# Patient Record
Sex: Male | Born: 1995 | Race: White | Hispanic: No | Marital: Single | Smoking: Current every day smoker
Health system: Southern US, Community
[De-identification: ages and names within clinical notes are randomized; demographics above are authoritative.]

## PROBLEM LIST (undated history)

## (undated) DIAGNOSIS — F32A Depression, unspecified: Secondary | ICD-10-CM

## (undated) DIAGNOSIS — F29 Unspecified psychosis not due to a substance or known physiological condition: Secondary | ICD-10-CM

## (undated) DIAGNOSIS — F4310 Post-traumatic stress disorder, unspecified: Secondary | ICD-10-CM

## (undated) HISTORY — PX: TONSILLECTOMY: SUR1361

## (undated) HISTORY — PX: NASAL SINUS SURGERY: SHX719

---

## 2021-07-13 ENCOUNTER — Emergency Department (HOSPITAL_COMMUNITY)
Admission: EM | Admit: 2021-07-13 | Discharge: 2021-07-13 | Disposition: A | Discharge status: Home / Self Care | Payer: Medicaid Other | Attending: Emergency Medicine | Admitting: Emergency Medicine

## 2021-07-13 ENCOUNTER — Other Ambulatory Visit: Payer: Self-pay

## 2021-07-13 DIAGNOSIS — R001 Bradycardia, unspecified: Secondary | ICD-10-CM | POA: Insufficient documentation

## 2021-07-13 DIAGNOSIS — R112 Nausea with vomiting, unspecified: Secondary | ICD-10-CM | POA: Diagnosis present

## 2021-07-13 DIAGNOSIS — R059 Cough, unspecified: Secondary | ICD-10-CM | POA: Insufficient documentation

## 2021-07-13 DIAGNOSIS — R197 Diarrhea, unspecified: Secondary | ICD-10-CM | POA: Diagnosis not present

## 2021-07-13 DIAGNOSIS — Z20822 Contact with and (suspected) exposure to covid-19: Secondary | ICD-10-CM | POA: Diagnosis not present

## 2021-07-13 DIAGNOSIS — R0981 Nasal congestion: Secondary | ICD-10-CM | POA: Diagnosis not present

## 2021-07-13 LAB — COMPREHENSIVE METABOLIC PANEL
ALT: 25 U/L (ref 0–44)
AST: 20 U/L (ref 15–41)
Albumin: 4.3 g/dL (ref 3.5–5.0)
Alkaline Phosphatase: 51 U/L (ref 38–126)
Anion gap: 7 (ref 5–15)
BUN: 8 mg/dL (ref 6–20)
CO2: 25 mmol/L (ref 22–32)
Calcium: 9.2 mg/dL (ref 8.9–10.3)
Chloride: 107 mmol/L (ref 98–111)
Creatinine, Ser: 1.03 mg/dL (ref 0.61–1.24)
GFR, Estimated: >60 mL/min (ref >60)
Glucose, Bld: 111 mg/dL — ABNORMAL HIGH (ref 70–99)
Potassium: 3.9 mmol/L (ref 3.5–5.1)
Sodium: 139 mmol/L (ref 135–145)
Total Bilirubin: 0.4 mg/dL (ref 0.3–1.2)
Total Protein: 6.9 g/dL (ref 6.5–8.1)

## 2021-07-13 LAB — URINALYSIS, ROUTINE W REFLEX MICROSCOPIC
Bilirubin Urine: NEGATIVE
Glucose, UA: NEGATIVE mg/dL
Hgb urine dipstick: NEGATIVE
Ketones, ur: NEGATIVE mg/dL
Leukocytes,Ua: NEGATIVE
Nitrite: NEGATIVE
Protein, ur: NEGATIVE mg/dL
Specific Gravity, Urine: 1.01 (ref 1.005–1.030)
pH: 6 (ref 5.0–8.0)

## 2021-07-13 LAB — CBC
HCT: 47.6 % (ref 39.0–52.0)
Hemoglobin: 15.9 g/dL (ref 13.0–17.0)
MCH: 29.5 pg (ref 26.0–34.0)
MCHC: 33.4 g/dL (ref 30.0–36.0)
MCV: 88.3 fL (ref 80.0–100.0)
Platelets: 241 10*3/uL (ref 150–400)
RBC: 5.39 MIL/uL (ref 4.22–5.81)
RDW: 13.5 % (ref 11.5–15.5)
WBC: 14.5 10*3/uL — ABNORMAL HIGH (ref 4.0–10.5)
nRBC: 0 % (ref 0.0–0.2)

## 2021-07-13 LAB — RESP PANEL BY RT-PCR (FLU A&B, COVID) ARPGX2
Influenza A by PCR: NEGATIVE
Influenza B by PCR: NEGATIVE
SARS Coronavirus 2 by RT PCR: NEGATIVE

## 2021-07-13 LAB — LIPASE, BLOOD: Lipase: 63 U/L — ABNORMAL HIGH (ref 11–51)

## 2021-07-13 MED ORDER — ONDANSETRON 4 MG PO TBDP: ORAL_TABLET | ORAL | 0 refills | Status: DC

## 2021-07-13 MED ORDER — ONDANSETRON 4 MG PO TBDP
4.0000 mg | ORAL_TABLET | Freq: Once | ORAL | Status: AC
  Administered 2021-07-13: 4 mg via ORAL
  Filled 2021-07-13: qty 1

## 2021-07-13 NOTE — ED Provider Triage Note (Signed)
Emergency Medicine Provider Triage Evaluation Note  Karma Hiney , a 26 y.o. male  was evaluated in triage.  Pt complains of n/v/d. States that same has been ongoing for the past 2 weeks. He states that he is nauseous when he wakes up in the morning and has 3-4 episodes of vomiting throughout the morning which then resolves as the day progresses. States that diarrhea is constant throughout the day. Also endorses associated URI symptoms that has also been ongoing for the past 2 weeks. States that his employer is requiring that he have a work note to return to work.   Review of Systems  Positive: N/v/d Negative: Abd pain, fevers, chills  Physical Exam  BP 122/85 (BP Location: Right Arm)    Pulse (!) 103    Temp 98.2 F (36.8 C) (Oral)    Resp 16    SpO2 98%  Gen:   Awake, no distress   Resp:  Normal effort  MSK:   Moves extremities without difficulty  Other:    Medical Decision Making  Medically screening exam initiated at 10:13 AM.  Appropriate orders placed.  Dreyson Mishkin was informed that the remainder of the evaluation will be completed by another provider, this initial triage assessment does not replace that evaluation, and the importance of remaining in the ED until their evaluation is complete.     Silva Bandy, PA-C 07/13/21 1017

## 2021-07-13 NOTE — Discharge Instructions (Signed)
Use Zofran as needed for nausea or vomiting. Follow-up COVID test results on MyChart this evening. Work note provided.

## 2021-07-13 NOTE — ED Triage Notes (Signed)
Pt reports two weeks of n/v/d for the first half of the day. Endorses generalized weakness. Reports he is here because his boss wants him to have a work note.

## 2021-07-13 NOTE — ED Provider Notes (Signed)
[facility] EMERGENCY DEPARTMENT Provider Note   CSN: [id number] Arrival date & time: 07/13/21  0947     History  Chief Complaint  Patient presents with   Emesis   Diarrhea    Jon Clayton is a 26 y.o. male.  Patient with no active medical problems presents with nausea vomiting diarrhea congestion cough intermittent for almost 2 weeks.  No significant sick contacts or recent travel.  Vomiting nonbloody nonbilious.  Symptoms intermittent throughout the week.  Nothing specifically triggers.  No new foods.  Patient also needs work note.      Home Medications Prior to Admission medications   Medication Sig Start Date End Date Taking? Authorizing Provider  albuterol (VENTOLIN HFA) 108 (90 Base) MCG/ACT inhaler Inhale 1-2 puffs into the lungs every 6 (six) hours as needed for wheezing or shortness of breath.   Yes [provider]  ondansetron (ZOFRAN-ODT) 4 MG disintegrating tablet 4mg  ODT q4 hours prn nausea/vomit 07/13/21  Yes Elnora Morrison, MD  busPIRone (BUSPAR) 15 MG tablet Take 15 mg by mouth 3 (three) times daily. No fill Hx in the past year    [provider]      Allergies    Pertussis vaccines, Amoxicillin, and Penicillins    Review of Systems   Review of Systems  Constitutional:  Negative for chills and fever.  HENT:  Positive for congestion.   Eyes:  Negative for visual disturbance.  Respiratory:  Positive for cough. Negative for shortness of breath.   Cardiovascular:  Negative for chest pain.  Gastrointestinal:  Positive for diarrhea, nausea and vomiting. Negative for abdominal pain.  Genitourinary:  Negative for dysuria and flank pain.  Musculoskeletal:  Negative for back pain, neck pain and neck stiffness.  Skin:  Negative for rash.  Neurological:  Negative for light-headedness and headaches.   Physical Exam Updated Vital Signs BP (!) 112/56    Pulse (!) 54    Temp 98.2 F (36.8 C) (Oral)    Resp (!) 27    SpO2 96%   Physical Exam Vitals and nursing note reviewed.  Constitutional:      General: He is not in acute distress.    Appearance: He is well-developed.  HENT:     Head: Normocephalic and atraumatic.     Mouth/Throat:     Mouth: Mucous membranes are dry.  Eyes:     General:        Right eye: No discharge.        Left eye: No discharge.     Conjunctiva/sclera: Conjunctivae normal.  Neck:     Trachea: No tracheal deviation.  Cardiovascular:     Rate and Rhythm: Regular rhythm. Bradycardia present.     Heart sounds: No murmur heard. Pulmonary:     Effort: Pulmonary effort is normal.     Breath sounds: Normal breath sounds.  Abdominal:     General: There is no distension.     Palpations: Abdomen is soft.     Tenderness: There is no abdominal tenderness. There is no guarding.  Musculoskeletal:        General: Normal range of motion.     Cervical back: Normal range of motion and neck supple. No rigidity.  Skin:    General: Skin is warm.     Capillary Refill: Capillary refill takes less than 2 seconds.     Findings: No rash.  Neurological:     General: No focal deficit present.     Mental Status: He  is alert.     Cranial Nerves: No cranial nerve deficit.  Psychiatric:        Mood and Affect: Mood normal.    ED Results / Procedures / Treatments   Labs (all labs ordered are listed, but only abnormal results are displayed) Labs Reviewed  LIPASE, BLOOD - Abnormal; Notable for the following components:      Result Value   Lipase 63 (*)    All other components within normal limits  COMPREHENSIVE METABOLIC PANEL - Abnormal; Notable for the following components:   Glucose, Bld 111 (*)    All other components within normal limits  CBC - Abnormal; Notable for the following components:   WBC 14.5 (*)    All other components within normal limits  RESP PANEL BY RT-PCR (FLU A&B, COVID) ARPGX2  URINALYSIS, ROUTINE W REFLEX MICROSCOPIC    EKG None  Radiology No results  found.  Procedures Procedures    Medications Ordered in ED Medications  ondansetron (ZOFRAN-ODT) disintegrating tablet 4 mg (4 mg Oral Given 07/13/21 1503)    ED Course/ Medical Decision Making/ A&P                           Medical Decision Making Amount and/or Complexity of Data Reviewed Labs: ordered.  Risk Prescription drug management.   Patient presents with intermittent symptoms for almost 2 weeks differential including viral/toxin mediated, multiple infections likely both viral, colitis, gastritis, pancreatitis, other.  No focal abdominal pain especially in the right lower quadrant to suggest appendicitis.  No clinical evidence of bacterial pneumonia with normal work of breathing, clear lungs and normal oxygenation.  Blood work ordered and independently reviewed reassuring mild leukocytosis 14,000 likely from viral infection or vomiting or both.  Lipase minimally elevated 63, not clinically significant for pancreatitis given exam and mild elevation.  Electrolytes unremarkable patient compensating well.  Zofran given.  Patient requests work note which was given.  Discussed close follow-up with a primary doctor.        Final Clinical Impression(s) / ED Diagnoses Final diagnoses:  Nausea vomiting and diarrhea  Cough in adult    Rx / DC Orders ED Discharge Orders          Ordered    ondansetron (ZOFRAN-ODT) 4 MG disintegrating tablet        07/13/21 1506              Elnora Morrison, MD 07/15/21 1550

## 2021-08-09 ENCOUNTER — Emergency Department (HOSPITAL_COMMUNITY)
Admission: EM | Admit: 2021-08-09 | Discharge: 2021-08-09 | Disposition: A | Discharge status: Home / Self Care | Payer: Medicaid Other | Attending: Emergency Medicine | Admitting: Emergency Medicine

## 2021-08-09 ENCOUNTER — Emergency Department (HOSPITAL_COMMUNITY): Payer: Medicaid Other

## 2021-08-09 ENCOUNTER — Encounter (HOSPITAL_COMMUNITY): Payer: Self-pay | Admitting: Emergency Medicine

## 2021-08-09 DIAGNOSIS — R197 Diarrhea, unspecified: Secondary | ICD-10-CM | POA: Insufficient documentation

## 2021-08-09 DIAGNOSIS — R1011 Right upper quadrant pain: Secondary | ICD-10-CM | POA: Diagnosis present

## 2021-08-09 DIAGNOSIS — K219 Gastro-esophageal reflux disease without esophagitis: Secondary | ICD-10-CM | POA: Diagnosis not present

## 2021-08-09 DIAGNOSIS — J45909 Unspecified asthma, uncomplicated: Secondary | ICD-10-CM | POA: Insufficient documentation

## 2021-08-09 DIAGNOSIS — R112 Nausea with vomiting, unspecified: Secondary | ICD-10-CM | POA: Diagnosis not present

## 2021-08-09 DIAGNOSIS — R059 Cough, unspecified: Secondary | ICD-10-CM | POA: Diagnosis not present

## 2021-08-09 LAB — COMPREHENSIVE METABOLIC PANEL
ALT: 22 U/L (ref 0–44)
AST: 18 U/L (ref 15–41)
Albumin: 4.4 g/dL (ref 3.5–5.0)
Alkaline Phosphatase: 47 U/L (ref 38–126)
Anion gap: 10 (ref 5–15)
BUN: 8 mg/dL (ref 6–20)
CO2: 23 mmol/L (ref 22–32)
Calcium: 9.1 mg/dL (ref 8.9–10.3)
Chloride: 107 mmol/L (ref 98–111)
Creatinine, Ser: 1.04 mg/dL (ref 0.61–1.24)
GFR, Estimated: >60 mL/min (ref >60)
Glucose, Bld: 103 mg/dL — ABNORMAL HIGH (ref 70–99)
Potassium: 4.1 mmol/L (ref 3.5–5.1)
Sodium: 140 mmol/L (ref 135–145)
Total Bilirubin: 0.7 mg/dL (ref 0.3–1.2)
Total Protein: 6.7 g/dL (ref 6.5–8.1)

## 2021-08-09 LAB — URINALYSIS, ROUTINE W REFLEX MICROSCOPIC
Bilirubin Urine: NEGATIVE
Glucose, UA: NEGATIVE mg/dL
Hgb urine dipstick: NEGATIVE
Ketones, ur: NEGATIVE mg/dL
Leukocytes,Ua: NEGATIVE
Nitrite: NEGATIVE
Protein, ur: NEGATIVE mg/dL
Specific Gravity, Urine: 1.019 (ref 1.005–1.030)
pH: 7 (ref 5.0–8.0)

## 2021-08-09 LAB — CBC
HCT: 47.1 % (ref 39.0–52.0)
Hemoglobin: 15.2 g/dL (ref 13.0–17.0)
MCH: 28.9 pg (ref 26.0–34.0)
MCHC: 32.3 g/dL (ref 30.0–36.0)
MCV: 89.5 fL (ref 80.0–100.0)
Platelets: 229 10*3/uL (ref 150–400)
RBC: 5.26 MIL/uL (ref 4.22–5.81)
RDW: 13.7 % (ref 11.5–15.5)
WBC: 8.4 10*3/uL (ref 4.0–10.5)
nRBC: 0 % (ref 0.0–0.2)

## 2021-08-09 LAB — LIPASE, BLOOD: Lipase: 51 U/L (ref 11–51)

## 2021-08-09 MED ORDER — ALBUTEROL SULFATE HFA 108 (90 BASE) MCG/ACT IN AERS
2.0000 | INHALATION_SPRAY | RESPIRATORY_TRACT | Status: AC
  Administered 2021-08-09: 2 via RESPIRATORY_TRACT
  Filled 2021-08-09: qty 6.7

## 2021-08-09 MED ORDER — SODIUM CHLORIDE 0.9 % IV BOLUS: 1000.0000 mL | Freq: Once | INTRAVENOUS | Status: DC

## 2021-08-09 MED ORDER — ONDANSETRON 4 MG PO TBDP: ORAL_TABLET | ORAL | 0 refills | Status: DC

## 2021-08-09 NOTE — ED Triage Notes (Signed)
Patient here with complaint of continued abdominal pain since being seen for same on February 2 this year. Patient denies changes in symptoms since then. Patient reports intermittent vomiting and diarrhea. Patient is alert, oriented, ambulatory, and in no apparent distress at this time.

## 2021-08-09 NOTE — ED Provider Triage Note (Signed)
Emergency Medicine Provider Triage Evaluation Note  Mahlon Gabrielle , a 26 y.o. male  was evaluated in triage.  Pt complains of right upper quadrant abdominal pain, nausea, vomiting, and diarrhea.  This is worse with foods.  Ongoing for 3 weeks.  Patient was seen and evaluated on 07/13/2021 for similar symptoms.  Review of Systems  Positive:  Negative: Fever, chills  Physical Exam  BP (!) 107/93 (BP Location: Right Arm)    Pulse 68    Temp 98.8 F (37.1 C) (Oral)    Resp 16    SpO2 98%  Gen:   Awake, no distress   Resp:  Normal effort  MSK:   Moves extremities without difficulty  Other:  Right upper quadrant, no tenderness  Medical Decision Making  Medically screening exam initiated at 12:54 PM.  Appropriate orders placed.  Dewan Emond was informed that the remainder of the evaluation will be completed by another provider, this initial triage assessment does not replace that evaluation, and the importance of remaining in the ED until their evaluation is complete.     Honor Loh Eugenio Saenz, [place] 08/09/21 1256

## 2021-08-09 NOTE — ED Provider Notes (Signed)
[facility] EMERGENCY DEPARTMENT Provider Note  History   Chief Complaint  Patient presents with   Abdominal Pain   The history is provided by the patient.  Abdominal Pain Pain location:  LUQ Pain quality: cramping and dull   Pain radiates to:  Does not radiate Pain severity:  Moderate Onset quality:  Unable to specify Duration: 1 mo. Timing:  Intermittent Progression:  Waxing and waning Chronicity:  New Context: eating and retching   Context: not awakening from sleep, not previous surgeries, not recent travel, not sick contacts, not suspicious food intake and not trauma   Relieved by:  Nothing Worsened by:  Eating Ineffective treatments:  None tried Associated symptoms: diarrhea, nausea and vomiting   Associated symptoms: no chest pain, no chills, no constipation, no cough, no dysuria, no fever, no hematuria, no shortness of breath and no sore throat   Risk factors: not elderly, has not had multiple surgeries and not obese     History reviewed. No pertinent past medical history.      No family history on file.  Review of Systems  Constitutional:  Negative for chills and fever.  HENT:  Negative for congestion and sore throat.   Eyes:  Negative for photophobia and visual disturbance.  Respiratory:  Negative for cough and shortness of breath.   Cardiovascular:  Negative for chest pain and palpitations.  Gastrointestinal:  Positive for abdominal pain, diarrhea, nausea and vomiting. Negative for abdominal distention, blood in stool and constipation.  Endocrine: Negative.   Genitourinary:  Negative for dysuria, flank pain, hematuria, penile pain and testicular pain.  Musculoskeletal:  Negative for neck pain and neck stiffness.  Skin:  Negative for rash and wound.  Allergic/Immunologic: Negative.   Neurological:  Negative for dizziness, syncope and headaches.  Hematological: Negative.   Psychiatric/Behavioral: Negative.      Physical Exam   Today's  Vitals   08/09/21 1227 08/09/21 1228 08/09/21 1459  BP:  (!) 107/93 94/61  Pulse:  68 65  Resp:  16 16  Temp:  98.8 F (37.1 C)   TempSrc:  Oral   SpO2:  98% 99%  PainSc: 6        Physical Exam Vitals and nursing note reviewed.  Constitutional:      General: He is not in acute distress.    Appearance: He is well-developed and normal weight. He is not ill-appearing, toxic-appearing or diaphoretic.  HENT:     Head: Normocephalic and atraumatic.     Nose: Nose normal. No congestion.     Mouth/Throat:     Mouth: Mucous membranes are moist.     Pharynx: Oropharynx is clear. No oropharyngeal exudate.  Eyes:     Extraocular Movements: Extraocular movements intact.     Pupils: Pupils are equal, round, and reactive to light.  Cardiovascular:     Rate and Rhythm: Normal rate and regular rhythm.     Pulses: Normal pulses.     Heart sounds: Normal heart sounds. No murmur heard. Pulmonary:     Effort: Pulmonary effort is normal. No respiratory distress.     Breath sounds: Normal breath sounds. No stridor. No wheezing, rhonchi or rales.  Abdominal:     General: There is no distension.     Palpations: Abdomen is soft.     Tenderness: There is no abdominal tenderness. There is no right CVA tenderness, left CVA tenderness, guarding or rebound.     Hernia: No hernia is present.  Musculoskeletal:  General: No swelling. Normal range of motion.     Cervical back: Normal range of motion and neck supple. No tenderness.     Right lower leg: No edema.     Left lower leg: No edema.  Skin:    General: Skin is warm and dry.     Capillary Refill: Capillary refill takes less than 2 seconds.     Findings: No rash.  Neurological:     General: No focal deficit present.     Mental Status: He is alert and oriented to person, place, and time. Mental status is at baseline.     Cranial Nerves: No cranial nerve deficit.     Sensory: No sensory deficit.     Motor: No weakness.     Coordination:  Coordination normal.  Psychiatric:        Mood and Affect: Mood normal.        Behavior: Behavior normal.    ED Course  Procedures  Medical Decision Making:  Jon Clayton is a 26 y.o. male w/ h/o IBS (reportedly diagnosed as a child), GERD, asthma who p/w chronic, assisted RUQ abdominal pain, emesis, diarrhea.   Patient reports he has had ongoing diarrhea for years, nonbloody, usually twice a day, the patient reports that approximately 80% of his stools are loose.  Patient states he has a history of IBS diagnosed as a child, but does not take any medications.  He has not been seen by GI as an adult.  Patient reports he has had daily emesis over the past 1.5-2 months.  He reports nonbloody emesis, approximately 2 episodes per day, persistent nausea, but able to tolerate p.o. intermittently.  Patient reports RUQ abdominal pain intermittently over the past month, worse with eating "anything with a sauce."  Patient denies preceding trauma.  Denies fever.  Denies history of abdominal surgery.  Patient states he has had an intermittent cough of unknown duration, he states he has a history of asthma and has "been out of his home inhalers for months."  Patient states he should be taking Symbicort and albuterol.  Patient clarifies that he does not have a primary care doctor but would be interested in establishing with primary care.  She denies any testicular pain or urinary symptoms. No flank pain on examination Lungs clear Abdomen soft, nontender (to include in the RUQ)  RUQ ultrasound ordered while patient was in triage, has already been obtained, results below.  Basic lab work ordered in triage, results below.  ER provider interpretation of Imaging / Radiology:  RUQ [place]: Fatty liver, no other sonographic abnormality seen in RUQ.  No gallstones or gallbladder wall thickening, no sonographic Murphy sign.  ER provider interpretation of EKG:  Not indicated  ER provider interpretation of Labs:   CBC: No leukocytosis or acute anemia CMP: No significant electrolyte abnormality, no AKI, no elevated LFTs Lipase: Within normal range UA: UA without UTI or blood  Key medications administered in the ER:  Medications  albuterol (VENTOLIN HFA) 108 (90 Base) MCG/ACT inhaler 2 puff (has no administration in time range)   Diagnoses considered: Unknown etiology of persistent intermittent abdominal pain, may relate to viral infection versus IBD vs gastritis.  Doubt bowel obstruction as patient is passing gas and has regular bowel movements.  Doubt AAA as no history of the same.  Doubt ACS as young, no chest pain.  Offered CXR to evaluate for PNA, patient declined.  Doubt PTX as has bilateral breath sounds in all fields.  Doubt  pyelonephritis as UA without UTI, and no flank pain.  Doubt nephrolithiasis as UA without blood and no flank pain.  Doubt pancreatitis as no epigastric tenderness to palpation, and lipase within normal range.  Doubt cholecystitis as RUQ ultrasound did not reveal gallstones, gallbladder wall thickening, or sonographic Murphy sign.  Doubt shingles as young age and no overlying skin changes.  Doubt perforated bowel or ulcer as no peritonitis on examination.  Doubt diverticulosis/diverticulitis as pain localized in RUQ.  Doubt ischemic mesentery as patient does not display pain out of proportion, and is not pain with every p.o. intake.  Doubt strangulated/incarcerated hernia as no evidence on examination.  Patient without peritoneal signs or other indication of need for surgical intervention.  Consulted: None required in the ED.  Patient able to tolerate p.o. on examination prior to discharge Discharge with short prescription for Zofran Information given for PCP and outpatient GI Offered CXR in setting of abdominal pain, emesis, and cough.  Patient declined. Patient states he has been out of his albuterol MDI for a few months and he has had increased cough, will give albuterol MDI in  the ED for the patient to take home.  No respiratory distress or wheezing at this time.  Key discharge instructions: Spoke to patient at bedside, all questions were answered at this time, close return precautions given, and patient voiced understanding and agreement with plan. Patient discharged in stable condition.   Patient seen in conjunction with Dr. Dawna Part medical dictation software was used in the creation of this note.   Electronically signed by: Wynetta Fines, MD on 08/09/2021 at 4:36 PM  Clinical Impression:  1. RUQ abdominal pain     Dispo: Discharge    Wynetta Fines, MD 08/09/21 2221    Carmin Muskrat, MD 08/09/21 2244

## 2021-08-09 NOTE — Discharge Instructions (Addendum)
Please call [phone] to establish with outpatient primary care to address your chronic medical conditions.  You may call [phone] to follow-up with outpatient [facility] for your chronic abdominal pain.  We offered a chest x-ray in the emergency department, you declined.  The ultrasound of your gallbladder does not reveal any acute gallbladder pathology.  Your labs were unremarkable for any acute emergent problem.

## 2021-11-03 ENCOUNTER — Encounter (HOSPITAL_COMMUNITY): Payer: Self-pay

## 2021-11-03 ENCOUNTER — Other Ambulatory Visit: Payer: Self-pay

## 2021-11-03 ENCOUNTER — Emergency Department (HOSPITAL_COMMUNITY)
Admission: EM | Admit: 2021-11-03 | Discharge: 2021-11-04 | Disposition: A | Discharge status: Home / Self Care | Payer: Medicaid Other | Attending: Emergency Medicine | Admitting: Emergency Medicine

## 2021-11-03 DIAGNOSIS — Z20822 Contact with and (suspected) exposure to covid-19: Secondary | ICD-10-CM | POA: Insufficient documentation

## 2021-11-03 DIAGNOSIS — F332 Major depressive disorder, recurrent severe without psychotic features: Secondary | ICD-10-CM | POA: Diagnosis not present

## 2021-11-03 DIAGNOSIS — R4182 Altered mental status, unspecified: Secondary | ICD-10-CM | POA: Insufficient documentation

## 2021-11-03 DIAGNOSIS — F419 Anxiety disorder, unspecified: Secondary | ICD-10-CM

## 2021-11-03 DIAGNOSIS — R4589 Other symptoms and signs involving emotional state: Secondary | ICD-10-CM

## 2021-11-03 DIAGNOSIS — F322 Major depressive disorder, single episode, severe without psychotic features: Secondary | ICD-10-CM | POA: Insufficient documentation

## 2021-11-03 DIAGNOSIS — F39 Unspecified mood [affective] disorder: Secondary | ICD-10-CM

## 2021-11-03 DIAGNOSIS — R45851 Suicidal ideations: Secondary | ICD-10-CM | POA: Diagnosis not present

## 2021-11-03 DIAGNOSIS — F430 Acute stress reaction: Secondary | ICD-10-CM

## 2021-11-03 LAB — ETHANOL: Alcohol, Ethyl (B): <10 mg/dL (ref <10)

## 2021-11-03 LAB — COMPREHENSIVE METABOLIC PANEL
ALT: 15 U/L (ref 0–44)
AST: 14 U/L — ABNORMAL LOW (ref 15–41)
Albumin: 4.7 g/dL (ref 3.5–5.0)
Alkaline Phosphatase: 49 U/L (ref 38–126)
Anion gap: 4 — ABNORMAL LOW (ref 5–15)
BUN: 10 mg/dL (ref 6–20)
CO2: 23 mmol/L (ref 22–32)
Calcium: 8.9 mg/dL (ref 8.9–10.3)
Chloride: 111 mmol/L (ref 98–111)
Creatinine, Ser: 0.88 mg/dL (ref 0.61–1.24)
GFR, Estimated: >60 mL/min (ref >60)
Glucose, Bld: 96 mg/dL (ref 70–99)
Potassium: 4 mmol/L (ref 3.5–5.1)
Sodium: 138 mmol/L (ref 135–145)
Total Bilirubin: 0.6 mg/dL (ref 0.3–1.2)
Total Protein: 7.4 g/dL (ref 6.5–8.1)

## 2021-11-03 LAB — CBC WITH DIFFERENTIAL/PLATELET
Abs Immature Granulocytes: 0.02 10*3/uL (ref 0.00–0.07)
Basophils Absolute: 0 10*3/uL (ref 0.0–0.1)
Basophils Relative: 0 %
Eosinophils Absolute: 0 10*3/uL (ref 0.0–0.5)
Eosinophils Relative: 0 %
HCT: 45.2 % (ref 39.0–52.0)
Hemoglobin: 15 g/dL (ref 13.0–17.0)
Immature Granulocytes: 0 %
Lymphocytes Relative: 18 %
Lymphs Abs: 2 10*3/uL (ref 0.7–4.0)
MCH: 29.3 pg (ref 26.0–34.0)
MCHC: 33.2 g/dL (ref 30.0–36.0)
MCV: 88.3 fL (ref 80.0–100.0)
Monocytes Absolute: 0.5 10*3/uL (ref 0.1–1.0)
Monocytes Relative: 4 %
Neutro Abs: 8.2 10*3/uL — ABNORMAL HIGH (ref 1.7–7.7)
Neutrophils Relative %: 78 %
Platelets: 228 10*3/uL (ref 150–400)
RBC: 5.12 MIL/uL (ref 4.22–5.81)
RDW: 13.6 % (ref 11.5–15.5)
WBC: 10.7 10*3/uL — ABNORMAL HIGH (ref 4.0–10.5)
nRBC: 0 % (ref 0.0–0.2)

## 2021-11-03 LAB — RAPID URINE DRUG SCREEN, HOSP PERFORMED
Amphetamines: NOT DETECTED
Barbiturates: NOT DETECTED
Benzodiazepines: NOT DETECTED
Cocaine: NOT DETECTED
Opiates: NOT DETECTED
Tetrahydrocannabinol: POSITIVE — AB

## 2021-11-03 LAB — RESP PANEL BY RT-PCR (FLU A&B, COVID) ARPGX2
Influenza A by PCR: NEGATIVE
Influenza B by PCR: NEGATIVE
SARS Coronavirus 2 by RT PCR: NEGATIVE

## 2021-11-03 NOTE — ED Provider Notes (Signed)
[facility]-EMERGENCY DEPT Provider Note   CSN: [id number] Arrival date & time: 11/03/21  1737     History  Chief Complaint  Patient presents with   Suicidal    Jon Clayton is a 26 y.o. male.  Patient complains of being suicidal.  He states that he has tried to hurt himself before and he wants to hurt himself now.  Patient has no medical problems  The history is provided by the patient and medical records. No language interpreter was used.  Altered Mental Status Presenting symptoms: behavior changes   Severity:  Severe Most recent episode:  More than 2 days ago Episode history:  Continuous Timing:  Constant Progression:  Worsening Chronicity:  Recurrent Context: not alcohol use   Associated symptoms: no abdominal pain, no hallucinations, no headaches, no rash and no seizures       Home Medications Prior to Admission medications   Medication Sig Start Date End Date Taking? Authorizing Provider  albuterol (VENTOLIN HFA) 108 (90 Base) MCG/ACT inhaler Inhale 1-2 puffs into the lungs every 6 (six) hours as needed for wheezing or shortness of breath.    [provider]  busPIRone (BUSPAR) 15 MG tablet Take 15 mg by mouth 3 (three) times daily. No fill Hx in the past year    [provider]  ondansetron (ZOFRAN-ODT) 4 MG disintegrating tablet 4mg  ODT q4 hours prn nausea/vomit 08/09/21   08/11/21, MD      Allergies    Pertussis vaccines, Amoxicillin, and Penicillins    Review of Systems   Review of Systems  Constitutional:  Negative for appetite change and fatigue.  HENT:  Negative for congestion, ear discharge and sinus pressure.   Eyes:  Negative for discharge.  Respiratory:  Negative for cough.   Cardiovascular:  Negative for chest pain.  Gastrointestinal:  Negative for abdominal pain and diarrhea.  Genitourinary:  Negative for frequency and hematuria.  Musculoskeletal:  Negative for back pain.  Skin:  Negative for rash.   Neurological:  Negative for seizures and headaches.  Psychiatric/Behavioral:  Negative for hallucinations.        Suicidal   Physical Exam Updated Vital Signs BP (!) 99/55 (BP Location: Left Arm)   Pulse 66   Temp 98.4 F (36.9 C)   Resp 17   Ht 5' 7.25" (1.708 m)   Wt 74.8 kg   SpO2 99%   BMI 25.65 kg/m  Physical Exam Vitals and nursing note reviewed.  Constitutional:      Appearance: He is well-developed.  HENT:     Head: Normocephalic.     Right Ear: External ear normal.     Nose: Nose normal.  Eyes:     General: No scleral icterus.    Conjunctiva/sclera: Conjunctivae normal.  Neck:     Thyroid: No thyromegaly.  Cardiovascular:     Rate and Rhythm: Normal rate and regular rhythm.     Heart sounds: No murmur heard.   No friction rub. No gallop.  Pulmonary:     Breath sounds: No stridor. No wheezing or rales.  Chest:     Chest wall: No tenderness.  Abdominal:     General: There is no distension.     Tenderness: There is no abdominal tenderness. There is no rebound.  Musculoskeletal:        General: Normal range of motion.     Cervical back: Neck supple.  Lymphadenopathy:     Cervical: No cervical adenopathy.  Skin:  Findings: No erythema or rash.  Neurological:     Mental Status: He is alert and oriented to person, place, and time.     Motor: No abnormal muscle tone.     Coordination: Coordination normal.  Psychiatric:     Comments: Patient is suicidal    ED Results / Procedures / Treatments   Labs (all labs ordered are listed, but only abnormal results are displayed) Labs Reviewed  COMPREHENSIVE METABOLIC PANEL - Abnormal; Notable for the following components:      Result Value   AST 14 (*)    Anion gap 4 (*)    All other components within normal limits  RAPID URINE DRUG SCREEN, HOSP PERFORMED - Abnormal; Notable for the following components:   Tetrahydrocannabinol POSITIVE (*)    All other components within normal limits  CBC WITH  DIFFERENTIAL/PLATELET - Abnormal; Notable for the following components:   WBC 10.7 (*)    Neutro Abs 8.2 (*)    All other components within normal limits  ETHANOL    EKG None  Radiology No results found.  Procedures Procedures    Medications Ordered in ED Medications - No data to display  ED Course/ Medical Decision Making/ A&P                           Medical Decision Making Amount and/or Complexity of Data Reviewed Labs: ordered.  This patient presents to the ED for concern of suicidal, this involves an extensive number of treatment options, and is a complaint that carries with it a high risk of complications and morbidity.  The differential diagnosis includes suicidal   Co morbidities that complicate the patient evaluation  History depression suicidal ideation   Additional history obtained:  Additional history obtained from patient External records from outside source obtained and reviewed including hospital record   Lab Tests:  I Ordered, and personally interpreted labs.  The pertinent results include: CBC chemistries and alcohol unremarkable   Imaging Studies ordered:  No imaging  Cardiac Monitoring: / EKG:  The patient was maintained on a cardiac monitor.  I personally viewed and interpreted the cardiac monitored which showed an underlying rhythm of: Normal sinus rhythm   Consultations Obtained:  I requested consultation with the behavioral health,  and discussed lab and imaging findings as well as pertinent plan - they recommend: Still pending   Problem List / ED Course / Critical interventions / Medication management  Suicidal ideation No medicines Reevaluation of the patient after these medicines showed that the patient stayed the same I have reviewed the patients home medicines and have made adjustments as needed   Social Determinants of Health:  None   Test / Admission - Considered:  No further testing needed we will await  behavioral health recommendations but I suspect he will be admitted for suicidal ideation  Patient with suicidal ideations.  He will be evaluated by behavioral health        Final Clinical Impression(s) / ED Diagnoses Final diagnoses:  None    Rx / DC Orders ED Discharge Orders     None         Bethann Berkshire, MD 11/03/21 Jerene Bears

## 2021-11-03 NOTE — ED Triage Notes (Signed)
Pt states he has not taken his psych meds in 2-3 months. Pt states he threatened to kill himself in front of his landlord today and is now here for SI.

## 2021-11-03 NOTE — ED Provider Notes (Signed)
Patient is involuntarily committed for suicidal ideation   Bethann Berkshire, MD 11/03/21 [phone]

## 2021-11-03 NOTE — ED Provider Notes (Signed)
Patient is suicidal.  He is medically cleared and can be evaluated by behavioral health now   Bethann Berkshire, MD 11/03/21 [phone]

## 2021-11-04 ENCOUNTER — Encounter (HOSPITAL_COMMUNITY): Payer: Self-pay | Admitting: Emergency Medicine

## 2021-11-04 ENCOUNTER — Emergency Department (HOSPITAL_COMMUNITY)
Admission: EM | Admit: 2021-11-04 | Discharge: 2021-11-05 | Disposition: A | Discharge status: Other Acute Inpatient Hospital | Payer: Medicaid Other | Source: Home / Self Care | Attending: Emergency Medicine | Admitting: Emergency Medicine

## 2021-11-04 ENCOUNTER — Ambulatory Visit (HOSPITAL_COMMUNITY)
Admission: AD | Admit: 2021-11-04 | Discharge: 2021-11-04 | Disposition: A | Discharge status: Home / Self Care | Payer: Medicaid Other | Attending: Psychiatry | Admitting: Psychiatry

## 2021-11-04 DIAGNOSIS — F4310 Post-traumatic stress disorder, unspecified: Secondary | ICD-10-CM | POA: Insufficient documentation

## 2021-11-04 DIAGNOSIS — F332 Major depressive disorder, recurrent severe without psychotic features: Secondary | ICD-10-CM | POA: Insufficient documentation

## 2021-11-04 DIAGNOSIS — R45851 Suicidal ideations: Secondary | ICD-10-CM | POA: Insufficient documentation

## 2021-11-04 DIAGNOSIS — Z20822 Contact with and (suspected) exposure to covid-19: Secondary | ICD-10-CM | POA: Insufficient documentation

## 2021-11-04 HISTORY — DX: Unspecified psychosis not due to a substance or known physiological condition: F29

## 2021-11-04 HISTORY — DX: Depression, unspecified: F32.A

## 2021-11-04 LAB — COMPREHENSIVE METABOLIC PANEL
ALT: 15 U/L (ref 0–44)
AST: 15 U/L (ref 15–41)
Albumin: 4.9 g/dL (ref 3.5–5.0)
Alkaline Phosphatase: 49 U/L (ref 38–126)
Anion gap: 9 (ref 5–15)
BUN: 8 mg/dL (ref 6–20)
CO2: 23 mmol/L (ref 22–32)
Calcium: 9.7 mg/dL (ref 8.9–10.3)
Chloride: 110 mmol/L (ref 98–111)
Creatinine, Ser: 0.95 mg/dL (ref 0.61–1.24)
GFR, Estimated: >60 mL/min (ref >60)
Glucose, Bld: 95 mg/dL (ref 70–99)
Potassium: 3.8 mmol/L (ref 3.5–5.1)
Sodium: 142 mmol/L (ref 135–145)
Total Bilirubin: 1.2 mg/dL (ref 0.3–1.2)
Total Protein: 7.9 g/dL (ref 6.5–8.1)

## 2021-11-04 LAB — CBC
HCT: 47.4 % (ref 39.0–52.0)
Hemoglobin: 16 g/dL (ref 13.0–17.0)
MCH: 29.6 pg (ref 26.0–34.0)
MCHC: 33.8 g/dL (ref 30.0–36.0)
MCV: 87.6 fL (ref 80.0–100.0)
Platelets: 246 10*3/uL (ref 150–400)
RBC: 5.41 MIL/uL (ref 4.22–5.81)
RDW: 13.6 % (ref 11.5–15.5)
WBC: 12.6 10*3/uL — ABNORMAL HIGH (ref 4.0–10.5)
nRBC: 0 % (ref 0.0–0.2)

## 2021-11-04 LAB — SALICYLATE LEVEL: Salicylate Lvl: <7 mg/dL — ABNORMAL LOW (ref 7.0–30.0)

## 2021-11-04 LAB — ETHANOL: Alcohol, Ethyl (B): <10 mg/dL (ref <10)

## 2021-11-04 LAB — ACETAMINOPHEN LEVEL: Acetaminophen (Tylenol), Serum: <10 ug/mL — ABNORMAL LOW (ref 10–30)

## 2021-11-04 LAB — RAPID URINE DRUG SCREEN, HOSP PERFORMED
Amphetamines: NOT DETECTED
Barbiturates: NOT DETECTED
Benzodiazepines: NOT DETECTED
Cocaine: NOT DETECTED
Opiates: NOT DETECTED
Tetrahydrocannabinol: POSITIVE — AB

## 2021-11-04 LAB — RESP PANEL BY RT-PCR (FLU A&B, COVID) ARPGX2
Influenza A by PCR: NEGATIVE
Influenza B by PCR: NEGATIVE
SARS Coronavirus 2 by RT PCR: NEGATIVE

## 2021-11-04 NOTE — H&P (Addendum)
Behavioral Health Medical Screening Exam  Jon Clayton is a 26 y.o. 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  Patient reported that he was just discharged from [facility] with resources to go to [facility]. However, he did not have access to a bus to take him there so he decided to walk to [facility].   As per chart review from [facility] CSW spoke with pt at bedside. Pt reported living in a [place]way house. Pt stated he was being kicked out of the home due to not having the money to pay. Pt stated he has applied to many jobs however due to him being a sex offender he is unable to pass the background test. CSW inquired if pt contacted Department of Social Services yet to find out if they can assist with paying his rent. Pt stated he has not contacted them yet. Social services and shelter resources are attached to pt's AVS.  Pt stated he has concerns about his Mental Health. CSW informed pt about [facility]. Pt reported his concerns about his medications . CSW made MD aware of his concerns. CSW spoke with pt about staffing agencies in the area and attached a list to pt's AVS.  Reported several suicide attempts by strangulation in 2006, and multiple OD with Xanax. Reported self injurious behaviors Rated anxiety as "6" on a scale of 0 to 10. Reported several inpatient hospitalizations up to 6 in 2022. Endorsed 4 hours of sleep last night reported not being followed by a therapist or a psychiatrist. Reported his whole family with mental illness. Endorsed SI without any plan, HI without any plan and , AVH with seeing bugs and people moving, and "[name]" trying to put him in trouble. Endorsed all symptoms of depression as isolation, crying spells, irritability, hopelessness, worthlessness, guilt, poor concentration and anhedonia.  Reported he was hungry and food and drink provided.   On assessment today, patient seen in the conference room. Chart reviewed and findings shared with the tx team and discussed with Dr. Lucianne Muss. Alert and oriented x 3. Speech clear but low in volume. Mood /Affect anxious, depressed, blunt and flat. Thought content delusional with paranoid ideation. Memory, judgement and insight fair.  When asked how we could help stated, "just need a place to stay for few days and get my mind straight."  Patient appears to be malingering.  Disposition: Based on my evaluation, the patient does not appear to have an emergency psychiatric condition due to malingering. Patient was psychiatrically cleared and community resources provided. He left [facility] without any incident.  Total Time spent with patient: 1 hour  Psychiatric Specialty Exam:  Presentation  General Appearance: Appropriate for Environment; Casual; Fairly Groomed Eye Contact:Fleeting Speech:Clear and Coherent; Slow Speech Volume:Normal Handedness:Right  Mood and Affect  Mood:Anxious; Depressed Affect:Blunt; Flat  Thought Process  Thought Processes:Coherent; Linear Descriptions of Associations:Intact  Orientation:Full (Time, Place and Person)  Thought Content:Delusions; Paranoid Ideation  History of Schizophrenia/Schizoaffective disorder:No  Duration of Psychotic Symptoms:N/A  Hallucinations:Hallucinations: Auditory; Visual Description of Auditory Hallucinations: "[name]" try to get me in trouble Description of Visual Hallucinations: Seeing bugs and people moving  Ideas of Reference:No data recorded Suicidal Thoughts:Suicidal Thoughts: Yes, Passive (Patient malingering) SI Passive Intent and/or Plan: -- (Patient malingering)  Homicidal Thoughts:Homicidal Thoughts: Yes, Passive (Patient malingering) HI Passive Intent and/or Plan: -- (  Patient malingering)  Sensorium  Memory:Immediate Fair; Recent Fair; Remote  Fair Judgment:Fair Insight:Fair  Executive Functions  Concentration:Fair Attention Span:Fair Recall:Fair Fund of Knowledge:Fair Language:Good  Psychomotor Activity  Psychomotor Activity:Psychomotor Activity: Normal  Assets  Assets:Communication Skills; Physical Health  Sleep  Sleep:Sleep: Fair Number of Hours of Sleep: 4  Physical Exam: Physical Exam Vitals and nursing note reviewed.  Constitutional:      Appearance: Normal appearance.  HENT:     Head: Normocephalic and atraumatic.     Right Ear: External ear normal.     Left Ear: External ear normal.     Mouth/Throat:     Mouth: Mucous membranes are moist.     Pharynx: Oropharynx is clear.  Eyes:     Extraocular Movements: Extraocular movements intact.     Conjunctiva/sclera: Conjunctivae normal.     Pupils: Pupils are equal, round, and reactive to light.  Cardiovascular:     Rate and Rhythm: Bradycardia present.     Comments: P 54 Pulmonary:     Effort: Pulmonary effort is normal.  Abdominal:     Palpations: Abdomen is soft.  Genitourinary:    Comments: deferred Musculoskeletal:        General: Normal range of motion.     Cervical back: Normal range of motion and neck supple.  Skin:    General: Skin is warm.  Neurological:     General: No focal deficit present.     Mental Status: He is alert and oriented to person, place, and time.  Psychiatric:        Behavior: Behavior normal.   Review of Systems  Constitutional: Negative.  Negative for chills and fever.  HENT: Negative.  Negative for hearing loss and tinnitus.   Eyes: Negative.  Negative for blurred vision and double vision.  Respiratory: Negative.  Negative for cough, sputum production, shortness of breath and wheezing.   Cardiovascular: Negative.  Negative for chest pain.  Gastrointestinal: Negative.  Negative for abdominal pain, constipation, diarrhea, heartburn, nausea and vomiting.  Genitourinary: Negative.  Negative for dysuria, frequency and  urgency.  Musculoskeletal: Negative.  Negative for back pain, falls, joint pain, myalgias and neck pain.  Skin: Negative.  Negative for itching and rash.  Neurological: Negative.  Negative for dizziness, tingling, tremors, sensory change, speech change, focal weakness, seizures, loss of consciousness, weakness and headaches.  Endo/Heme/Allergies: Negative.  Negative for environmental allergies and polydipsia. Does not bruise/bleed easily.  Psychiatric/Behavioral:  Positive for depression, hallucinations, substance abuse and suicidal ideas. The patient is nervous/anxious and has insomnia.   Blood pressure 112/74, pulse (!) 54, temperature 97.6 F (36.4 C), temperature source Oral, resp. rate 16, SpO2 100 %. There is no height or weight on file to calculate BMI.  Musculoskeletal: Strength & Muscle Tone: within normal limits Gait & Station: normal Patient leans: N/A   Recommendations:  Based on my evaluation the patient does not appear to have an emergency psychiatric condition due to malingering. Patient was psychiatrically cleared and community resources provided.  Cecilie Lowers, FNP 11/04/2021, 4:34 PM

## 2021-11-04 NOTE — Discharge Instructions (Addendum)
It was our pleasure to provide your ER care today - we hope that you feel better.  See resource guide provided in terms of accessing local shelters and other social services in the area. Also see resources in area as relates facilitating outpatient behavioral health follow up/counseling/etc.   For mental health issues and/or crisis, you may also go directly to the [facility] - it is open 24/7 and walk-ins are welcome - see attached information.  Return to ER if worse, new symptoms, fevers, chest pain, trouble breathing, or other concern.    Staffing agencies  Patent attorney, [employer]

## 2021-11-04 NOTE — ED Provider Notes (Signed)
Emergency Medicine Observation Re-evaluation Note  Jon Clayton is a 26 y.o. male, seen on rounds today.  Pt initially presented to the ED for complaints of housing associated stress, and feelings of anxiety and depression. Currently, the patient is calm, alert, no distress. No physical c/o. Reports feeling improved this AM.   Physical Exam  BP 95/69 (BP Location: Left Arm)   Pulse 62   Temp 98.4 F (36.9 C)   Resp 15   Ht 1.708 m (5' 7.25")   Wt 74.8 kg   SpO2 98%   BMI 25.65 kg/m  Physical Exam General: alert, content.  Cardiac: regular rate.  Lungs: breathing comfortably. Psych: normal mood and affect. Pt does acknowledge stress/anxiety related to housing/housing instability. He indicates he is from [place], has been in this area for several months, living at [facility] home.  He indicates due to inability to pay he was being asked to leave there, and so they brought him to ED.  Pt notes intermittent feelings of anxiety, depression, and transient/recurrent SI for many years, but indicates has no plan to harm self, and is not wanting to harm or injure self. Pt is not responding to internal stimuli. No delusions or hallucinations are noted. No acute psychosis.   ED Course / MDM    I have reviewed the labs performed to date as well as medications administered while in observation.  Recent changes in the last 24 hours include  ED obs, reassessment.   Plan    Patient without active SI or plan to harm self - he does express frustration about new housing instability. TOC team asked to meet with patient and provide resources.   Pt to be provided resources for shelter, social services, and [facility] follow up.   Return precautions provided.      Cathren Laine, MD 11/04/21 [phone]

## 2021-11-04 NOTE — ED Notes (Signed)
Pt has two hospital bags and one blue duffel bag placed in nurse's station cabinet 9-12.

## 2021-11-04 NOTE — Progress Notes (Signed)
Nurse Practitioner [place] asked this RN to provide patient with list of homeless shelters and card with information for [facility].   Gave patient information. Pt stated he can return to his housing once he gets medication adjustment plan in place. Informed patient he can go to [facility] tomorrow morning between 8a - 11am for the medication clinic.    Patient planned to call his parole officer from lobby. Offered bus pass and taxi ride if needed. Stated he had a curfew due to parole and was calling his Research officer, trade union.   Receptionist stated patient left lobby without incident.

## 2021-11-04 NOTE — Progress Notes (Signed)
TOC CSW spoke with pt at bedside. Pt reported living in a Halfway house. Pt stated he was being kicked out of the home due to not having the money to pay. Pt stated he has applied to many jobs however due to him being a sex offender he is unable to pass the background test. CSW inquired if pt contacted Department of Social Services yet to find out if they can assist with paying his rent. Pt stated he has not contacted them yet. Social services and shelter resources are attached to pt's AVS.  Pt stated he has concerns about his Mental Health. CSW informed pt about [facility]. Pt reported his concerns about his medications . CSW made MD aware of his concerns. CSW spoke with pt about staffing agencies in the area and attached a list to pt's AVS.   Valentina Shaggy.Fisher Hargadon, MSW, LCSWA [facility]  Transitions of Care Clinical Social Worker I Direct Dial: [phone]  Fax: [phone] [email or web address]

## 2021-11-04 NOTE — ED Notes (Signed)
Pt has changed and been wanded by security. Knife locked up with security at this time as well.
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
## 2021-11-04 NOTE — ED Notes (Signed)
Pt changed out into scrubs.  Pt Bag 1: boots, socks, two cell phones, wallet, cigarettes, four lighters, gum, sharpie, headphones, sunglasses, watch, library card, house key, body spray, pen shirt and jeans.   Pt bag 2: belt, jacket,   Pt Duffle Bag: clothes and towel

## 2021-11-04 NOTE — ED Notes (Signed)
Pt wanded by Fayrene Fearing in security.

## 2021-11-04 NOTE — ED Notes (Addendum)
Pt instructed to go directly to [facility] for medication management and as resource for mental health crisis. Pt verbalized understanding and had no questions.

## 2021-11-04 NOTE — ED Notes (Signed)
Paperwork faxed to LMFT. Pt to rm 8 for TTS.

## 2021-11-04 NOTE — ED Provider Notes (Signed)
[facility]-EMERGENC[facility] DEPT Provider Note   CSN: [id number] Arrival date & time: 11/04/21  1900     History Chief Complaint  Patient presents with   Suicidal    Jon Clayton is a 26 y.o. male with h/o MDD presents to the ED for evaluation of SI with plan. Patient reports that he has been feeling suicidal "all my life".  The patient reports he is currently away from home and was staying at "[facility] of bills" at sober living.  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  He reports that he is feeling lonely since he has been avoiding his family, although he has been having recent family troubles with his sister.  When asked, the patient reports that he has had suicidal ideations "all of his life".  He was recently seen here in the emergency department for suicidal ideations and was discharged earlier this morning.  He reports that afterwards feeling suicidal with a plan.  He had climbed to the top of the parking deck and called the mobile crisis unit for help.  He denies any auditory visual hallucinations.  He denies any homicidal ideations.  He denies ingesting any drugs or foreign bodies.  He reports no medical history that includes MDD and PTSD.  Denies any self injury.  He presents voluntarily.   HPI     Home Medications Prior to Admission medications   Medication Sig Start Date End Date Taking? Authorizing Provider  busPIRone (BUSPAR) 15 MG tablet Take 15 mg by mouth 2 (two) times daily as needed (for anxiety).    [provider]      Allergies    Pertussis vaccines, Amoxicillin, Blue mussel [mytilus], and Penicillins    Review of Systems   Review of Systems  Physical Exam Updated Vital Signs BP 120/69 (BP Location: Left Arm)   Pulse (!) 56   Temp 98.1 F (36.7 C) (Oral)   Resp 17   Ht 5\' 7"  (1.702 m)   Wt 74.8 kg   SpO2 100%   BMI 25.84 kg/m  Physical Exam Constitutional:      Appearance: Normal  appearance.  Eyes:     General: No scleral icterus. Pulmonary:     Effort: Pulmonary effort is normal. No respiratory distress.  Skin:    General: Skin is dry.     Findings: No rash.  Neurological:     General: No focal deficit present.     Mental Status: He is alert. Mental status is at baseline.  Psychiatric:        Attention and Perception: He does not perceive auditory or visual hallucinations.        Mood and Affect: Affect is flat.        Thought Content: Thought content includes suicidal ideation. Thought content does not include homicidal ideation. Thought content includes suicidal plan. Thought content does not include homicidal plan.     Comments: Does not appear to be responding to any internal stimuli.     ED Results / Procedures / Treatments   Labs (all labs ordered are listed, but only abnormal results are displayed) Labs Reviewed  SALICYLATE LEVEL - Abnormal; Notable for the following components:      Result Value   Salicylate Lvl <7.0 (*)    All other components within normal limits  ACETAMINOPHEN LEVEL - Abnormal; Notable for the following components:   Acetaminophen (Tylenol), Serum <10 (*)    All other components within normal  limits  CBC - Abnormal; Notable for the following components:   WBC 12.6 (*)    All other components within normal limits  RAPID URINE DRUG SCREEN, HOSP PERFORMED - Abnormal; Notable for the following components:   Tetrahydrocannabinol POSITIVE (*)    All other components within normal limits  RESP PANEL BY RT-PCR (FLU A&B, COVID) [id number]  COMPREHENSIVE METABOLIC PANEL  ETHANOL    EKG None  Radiology No results found.  Procedures Procedures   Medications Ordered in ED Medications - No data to display  ED Course/ Medical Decision Making/ A&P                           Medical Decision Making Amount and/or Complexity of Data Reviewed Labs: ordered.   26 year old male presents emerged department for evaluation of  suicidal ideations with plan.  Vital signs show mild bradycardia at 56.  Normotensive, afebrile, satting well room air without increased work of breathing.  Physical exam is pertinent for well-appearing patient that has a flat affect.  He does not appear to be responding to any internal stimuli.  He does have suicidal ideations with a plan of jumping off of a parking garage.  Patient was recently discharged earlier this morning and was cleared for not having suicidal ideations.  He is currently experiencing some housing issues, could be likely secondary gain, however will still order labs and consult TTS.  I independently reviewed and interpreted the patient's labs.  Salicylate, acetaminophen, and ethanol levels are negative.  CBC shows mild increase in white blood cell count of 12.6, patient has no complaints, could be stress-induced.  UDS is positive for THC.  CMP shows no electrolyte or LFT abnormality.  COVID and flu pending.  Given the patient's overall unremarkable labs, the patient is medically clear for TTS.  He denies taking daily medications that he remembers.  ED psych hold orders placed.  Consulted TTS.  Awaiting TTS.   Final Clinical Impression(s) / ED Diagnoses Final diagnoses:  Suicidal ideation    Rx / DC Orders ED Discharge Orders     None         Achille Rich, Cordelia Poche 11/04/21 2222    Jacalyn Lefevre, MD 11/04/21 2307

## 2021-11-04 NOTE — ED Triage Notes (Addendum)
Pt reporting he is suicidal and having a very hard time. Reports he "is a picker". He states he was at the top of the parking deck twice thinking about jumping. He states his plan was to walk into lobby and cut wrist but saw [employer]. He states he was here earlier they let him go- he was supposed to go to [facility] but he did not know where he was. He waited at bus stop for 2 hours. He went to [facility] and was there for 6 hours. He was told to come back at 8am tomorrow. He lives in halfway house for recovering addicts and they told him he needs more help than they can give him.

## 2021-11-05 ENCOUNTER — Other Ambulatory Visit (HOSPITAL_COMMUNITY)
Admission: EM | Admit: 2021-11-05 | Discharge: 2021-11-09 | Disposition: A | Discharge status: Other Acute Inpatient Hospital | Payer: Medicaid Other | Attending: Psychiatry | Admitting: Psychiatry

## 2021-11-05 ENCOUNTER — Encounter (HOSPITAL_COMMUNITY): Payer: Self-pay | Admitting: Behavioral Health

## 2021-11-05 DIAGNOSIS — Z20822 Contact with and (suspected) exposure to covid-19: Secondary | ICD-10-CM | POA: Diagnosis not present

## 2021-11-05 DIAGNOSIS — F4310 Post-traumatic stress disorder, unspecified: Secondary | ICD-10-CM | POA: Insufficient documentation

## 2021-11-05 DIAGNOSIS — R45851 Suicidal ideations: Secondary | ICD-10-CM

## 2021-11-05 DIAGNOSIS — F333 Major depressive disorder, recurrent, severe with psychotic symptoms: Secondary | ICD-10-CM | POA: Insufficient documentation

## 2021-11-05 DIAGNOSIS — Z79899 Other long term (current) drug therapy: Secondary | ICD-10-CM | POA: Insufficient documentation

## 2021-11-05 MED ORDER — OLANZAPINE 2.5 MG PO TABS
2.5000 mg | ORAL_TABLET | Freq: Every day | ORAL | Status: DC
  Administered 2021-11-05: 2.5 mg via ORAL
  Filled 2021-11-05: qty 1

## 2021-11-05 MED ORDER — ONDANSETRON 4 MG PO TBDP
4.0000 mg | ORAL_TABLET | Freq: Three times a day (TID) | ORAL | Status: DC | PRN
  Administered 2021-11-05: 4 mg via ORAL
  Filled 2021-11-05: qty 1

## 2021-11-05 MED ORDER — HYDROXYZINE HCL 25 MG PO TABS
25.0000 mg | ORAL_TABLET | Freq: Three times a day (TID) | ORAL | Status: DC | PRN
Start: 2021-11-05 — End: 2021-11-09

## 2021-11-05 MED ORDER — BUSPIRONE HCL 15 MG PO TABS
15.0000 mg | ORAL_TABLET | Freq: Two times a day (BID) | ORAL | Status: DC
  Administered 2021-11-05 – 2021-11-09 (×9): 15 mg via ORAL
  Filled 2021-11-05 (×9): qty 1

## 2021-11-05 MED ORDER — NICOTINE 21 MG/24HR TD PT24
21.0000 mg | MEDICATED_PATCH | Freq: Every day | TRANSDERMAL | Status: DC
Start: 2021-11-05 — End: 2021-11-09
  Administered 2021-11-05 – 2021-11-09 (×5): 21 mg via TRANSDERMAL
  Filled 2021-11-05 (×5): qty 1

## 2021-11-05 MED ORDER — SERTRALINE HCL 50 MG PO TABS
50.0000 mg | ORAL_TABLET | Freq: Every day | ORAL | Status: DC
  Administered 2021-11-05 – 2021-11-09 (×5): 50 mg via ORAL
  Filled 2021-11-05 (×5): qty 1

## 2021-11-05 MED ORDER — MAGNESIUM HYDROXIDE 400 MG/5ML PO SUSP: 30.0000 mL | Freq: Every day | ORAL | Status: DC | PRN

## 2021-11-05 MED ORDER — ACETAMINOPHEN 325 MG PO TABS
650.0000 mg | ORAL_TABLET | Freq: Four times a day (QID) | ORAL | Status: DC | PRN
  Filled 2021-11-05: qty 2

## 2021-11-05 MED ORDER — TRAZODONE HCL 50 MG PO TABS
50.0000 mg | ORAL_TABLET | Freq: Every evening | ORAL | Status: DC | PRN
  Administered 2021-11-05 – 2021-11-08 (×4): 50 mg via ORAL
  Filled 2021-11-05 (×4): qty 1

## 2021-11-05 MED ORDER — ALUM & MAG HYDROXIDE-SIMETH 200-200-20 MG/5ML PO SUSP: 30.0000 mL | ORAL | Status: DC | PRN

## 2021-11-05 NOTE — ED Notes (Signed)
Pt. Loaded in vehicle with safe transport. Personal belongings and paperwork given to driver.

## 2021-11-05 NOTE — ED Provider Notes (Signed)
Facility Based Crisis Admission H&P  Date: 11/05/21 Patient Name: Jon Clayton MRN: [id number]  Diagnoses:  Final diagnoses:  Suicidal ideations    HPI: Jon Clayton is a 26 year old male who presented to the [facility] from [facility] for observation. Patient unable to be admitted to the continuous assessment unit due to current sex offender charges. Patient states that he is on parole and probation for being accused of molesting his nephew.   Patient seen and evaluated face-to-face by this provider, and chart reviewed. On evaluation, patient is alert and oriented x3. His thought process is circumstantial and speech is clear and coherent at a decreased tone. His mood is dysphoric and affect is congruent. Patient is difficult to assess as he does not provide a consistent history or answer questions appropriately.   Patient initially states that he is suicidal and that he needs help with medications. When asked to describe his suicidal ideations, he states "I see myself killing myself." He does not verbalize a plan or intent. He states, "If I don't get help, I am going to end it." He reports vague HI, and states that he has HI towards an "asshole" that was at the hospital.  When asked about current medications, patient states that he has been off his medications for a while. He states that he is prescribed Seroquel, BuSpar, and trazodone. He states that he ran out of the Seroquel 3 weeks ago. When asked who prescribes his medications, he states that he was provided with a 75-month supply of medications when he was hospitalized at [facility] in July 2022 because he was homeless. When asked to clarify because the time frame is not consistent, he does not respond. He states that he was diagnosed with MDD with psychosis, PTSD, and dissociative disorder. He states that while he was at [facility] in July they tried to discharge him to a tent and he told them he would "blow his brains out." He states that  they sent him [place] [facility] to [facility]. He states that he is from [place] and does not have any family support because his family thinks he is a monster. He states that he has been living at the [facility] since August 2022. He states that on Tuesday [employer] told him he had to leave because he was not paying his rent. He states that he told [employer] that he would end it all and so he was taken to the hospital.   He endorses [facility], seeing jack his whole life. He does not report AH. There is no objective evidence that the patient is currently responding to internal or external stimuli. On screening for depression, patient states that he is depressed. When asked to describe his depressive symptoms he states "all time high."   Patient reports smoking "pot" since age 36 with his family. He denies drinking alcohol. 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 UDS pos for THC. BAL is negative.    PHQ 2-9:  Flowsheet Row ED from 11/04/2021 in  [facility] from 11/03/2021 in [facility]  Thoughts that you would be better off dead, or of hurting yourself in some way Nearly every day Nearly every day  PHQ-9 Total Score 24 25       Flowsheet Row ED from 11/04/2021 in [facility] from 11/03/2021 in [facility] from 08/09/2021 in [facility] EMERGENCY DEPARTMENT  C-SSRS RISK  CATEGORY High Risk High Risk No Risk        Total Time spent with patient: 30 minutes  Musculoskeletal  Strength & Muscle Tone: within normal limits Gait & Station: normal Patient leans: N/A  Psychiatric Specialty Exam  Presentation General Appearance: Appropriate for Environment  Eye Contact:Minimal  Speech:Clear and Coherent  Speech Volume:Normal  Handedness:Right   Mood and Affect  Mood:Dysphoric  Affect:Congruent   Thought Process  Thought  Processes:Coherent  Descriptions of Associations:Circumstantial  Orientation:Full (Time, Place and Person)  Thought Content:Logical  Diagnosis of Schizophrenia or Schizoaffective disorder in past: No   Hallucinations:Hallucinations: Visual Description of Auditory Hallucinations: "[name]" try to get me in trouble Description of Visual Hallucinations: Seeing bugs and people moving  Ideas of Reference:No data recorded Suicidal Thoughts:Suicidal Thoughts: Yes, Active SI Passive Intent and/or Plan: -- (Patient malingering)  Homicidal Thoughts:Homicidal Thoughts: Yes, Passive HI Passive Intent and/or Plan: -- (Patient malingering)   Sensorium  Memory:Immediate Fair; Recent Fair; Remote Fair  Judgment:Fair  Insight:Fair   Executive Functions  Concentration:Fair  Attention Span:Fair  Recall:Fair  Fund of Knowledge:Fair  Language:Fair   Psychomotor Activity  Psychomotor Activity:Psychomotor Activity: Normal   Assets  Assets:Communication Skills; Desire for Improvement; Physical Health; Leisure Time   Sleep  Sleep:Sleep: Poor Number of Hours of Sleep: 4   No data recorded  Physical Exam HENT:     Head: Atraumatic.     Nose: Nose normal.  Eyes:     Conjunctiva/sclera: Conjunctivae normal.  Cardiovascular:     Rate and Rhythm: Normal rate.  Pulmonary:     Effort: Pulmonary effort is normal.  Neurological:     Mental Status: He is alert and oriented to person, place, and time.   Review of Systems  Constitutional: Negative.   HENT: Negative.    Eyes: Negative.   Respiratory: Negative.    Cardiovascular: Negative.   Gastrointestinal: Negative.   Genitourinary: Negative.   Musculoskeletal: Negative.   Skin: Negative.   Neurological: Negative.   Endo/Heme/Allergies: Negative.    Blood pressure 115/74, pulse 61, temperature 97.7 F (36.5 C), temperature source Oral, resp. rate 18, SpO2 100 %. There is no height or weight on file to calculate BMI.  Past  Psychiatric History: reported history of MDD with psychosis, PTSD, and dissociative disorder. Last psych hospitalization at [facility] in July 2022. Reports past suicide attempts.   Is the patient at risk to self? Yes  Has the patient been a risk to self in the past 6 months? Yes .    Has the patient been a risk to self within the distant past? Yes   Is the patient a risk to others? No   Has the patient been a risk to others in the past 6 months? No   Has the patient been a risk to others within the distant past? No   Past Medical History:  Past Medical History:  Diagnosis Date   Depression    Psychosis (HCC)    No past surgical history on file.  Family History: No family history on file.  Social History:  Social History   Socioeconomic History   Marital status: Single    Spouse name: Not on file   Number of children: Not on file   Years of education: Not on file   Highest education level: Not on file  Occupational History   Not on file  Tobacco Use   Smoking status: Not on file   Smokeless tobacco: Not on file  Substance and  Sexual Activity   Alcohol use: Not on file   Drug use: Not on file   Sexual activity: Not on file  Other Topics Concern   Not on file  Social History Narrative   Not on file   Social Determinants of Health   Financial Resource Strain: Not on file  Food Insecurity: Not on file  Transportation Needs: Not on file  Physical Activity: Not on file  Stress: Not on file  Social Connections: Not on file  Intimate Partner Violence: Not on file    SDOH:  SDOH Screenings   Alcohol Screen: Not on file  Depression (PHQ2-9): Medium Risk   PHQ-2 Score: 24  Financial Resource Strain: Not on file  Food Insecurity: Not on file  Housing: Not on file  Physical Activity: Not on file  Social Connections: Not on file  Stress: Not on file  Tobacco Use: Not on file  Transportation Needs: Not on file    Last Labs:  Admission on 11/04/2021,  Discharged on 11/05/2021  Component Date Value Ref Range Status   Sodium 11/04/2021 142  135 - 145 mmol/L Final   Potassium 11/04/2021 3.8  3.5 - 5.1 mmol/L Final   Chloride 11/04/2021 110  98 - 111 mmol/L Final   CO2 11/04/2021 23  22 - 32 mmol/L Final   Glucose, Bld 11/04/2021 95  70 - 99 mg/dL Final   Glucose reference range applies only to samples taken after fasting for at least 8 hours.   BUN 11/04/2021 8  6 - 20 mg/dL Final   Creatinine, Ser 11/04/2021 0.95  0.61 - 1.24 mg/dL Final   Calcium 20/80/2233 9.7  8.9 - 10.3 mg/dL Final   Total Protein 61/22/4497 7.9  6.5 - 8.1 g/dL Final   Albumin 53/00/5110 4.9  3.5 - 5.0 g/dL Final   AST 21/04/7355 15  15 - 41 U/L Final   ALT 11/04/2021 15  0 - 44 U/L Final   Alkaline Phosphatase 11/04/2021 49  38 - 126 U/L Final   Total Bilirubin 11/04/2021 1.2  0.3 - 1.2 mg/dL Final   GFR, Estimated 11/04/2021 >60  >60 mL/min Final   Comment: (NOTE) Calculated using the CKD-EPI Creatinine Equation (2021)    Anion gap 11/04/2021 9  5 - 15 Final   Performed at [facility], [address], [place], [place] [address]   Alcohol, Ethyl (B) 11/04/2021 <10  <10 mg/dL Final   Comment: (NOTE) Lowest detectable limit for serum alcohol is 10 mg/dL.  For medical purposes only. Performed at [facility], [address] [place], [place] [address]    Salicylate Lvl 11/04/2021 <7.0 (L)  7.0 - 30.0 mg/dL Final   Performed at [facility], [address] [place], [place] [address]   Acetaminophen (Tylenol), Serum 11/04/2021 <10 (L)  10 - 30 ug/mL Final   Comment: (NOTE) Therapeutic concentrations vary significantly. A range of 10-30 ug/mL  may be an effective concentration for many patients. However, some  are best treated at concentrations outside of this range. Acetaminophen concentrations >150 ug/mL at 4 hours after ingestion  and >50 ug/mL at 12 hours after ingestion are often associated with   toxic reactions.  Performed at [facility], [address] [place], [place] [address]    WBC 11/04/2021 12.6 (H)  4.0 - 10.5 K/uL Final   RBC 11/04/2021 5.41  4.22 - 5.81 MIL/uL Final   Hemoglobin 11/04/2021 16.0  13.0 - 17.0 g/dL Final  HCT 11/04/2021 47.4  39.0 - 52.0 % Final   MCV 11/04/2021 87.6  80.0 - 100.0 fL Final   MCH 11/04/2021 29.6  26.0 - 34.0 pg Final   MCHC 11/04/2021 33.8  30.0 - 36.0 g/dL Final   RDW 16/03/9603 13.6  11.5 - 15.5 % Final   Platelets 11/04/2021 246  150 - 400 K/uL Final   nRBC 11/04/2021 0.0  0.0 - 0.2 % Final   Performed at [facility], [address], [place], [place] [address]   Opiates 11/04/2021 NONE DETECTED  NONE DETECTED Final   Cocaine 11/04/2021 NONE DETECTED  NONE DETECTED Final   Benzodiazepines 11/04/2021 NONE DETECTED  NONE DETECTED Final   Amphetamines 11/04/2021 NONE DETECTED  NONE DETECTED Final   Tetrahydrocannabinol 11/04/2021 POSITIVE (A)  NONE DETECTED Final   Barbiturates 11/04/2021 NONE DETECTED  NONE DETECTED Final   Comment: (NOTE) DRUG SCREEN FOR MEDICAL PURPOSES ONLY.  IF CONFIRMATION IS NEEDED FOR ANY PURPOSE, NOTIFY LAB WITHIN 5 DAYS.  LOWEST DETECTABLE LIMITS FOR URINE DRUG SCREEN Drug Class                     Cutoff (ng/mL) Amphetamine and metabolites    1000 Barbiturate and metabolites    200 Benzodiazepine                 200 Tricyclics and metabolites     300 Opiates and metabolites        300 Cocaine and metabolites        300 THC                            50 Performed at [facility], [address], [place], [place] [address]    SARS Coronavirus 2 by RT PCR 11/04/2021 NEGATIVE  NEGATIVE Final   Comment: (NOTE) SARS-CoV-2 target nucleic acids are NOT DETECTED.  The SARS-CoV-2 RNA is generally detectable in upper respiratory specimens during the acute phase of infection. The lowest concentration of SARS-CoV-2 viral copies this assay can  detect is 138 copies/mL. A negative result does not preclude SARS-Cov-2 infection and should not be used as the sole basis for treatment or other patient management decisions. A negative result may occur with  improper specimen collection/handling, submission of specimen other than nasopharyngeal swab, presence of viral mutation(s) within the areas targeted by this assay, and inadequate number of viral copies(<138 copies/mL). A negative result must be combined with clinical observations, patient history, and epidemiological information. The expected result is Negative.  Fact Sheet for Patients:  BloggerCourse.com  Fact Sheet for Healthcare Providers:  SeriousBroker.it  This test is no                          t yet approved or cleared by the [place] FDA and  has been authorized for detection and/or diagnosis of SARS-CoV-2 by FDA under an Emergency Use Authorization (EUA). This EUA will remain  in effect (meaning this test can be used) for the duration of the COVID-19 declaration under Section 564(b)(1) of the Act, 21 U.S.C.section 360bbb-3(b)(1), unless the authorization is terminated  or revoked sooner.       Influenza A by PCR 11/04/2021 NEGATIVE  NEGATIVE Final   Influenza B by PCR 11/04/2021 NEGATIVE  NEGATIVE Final   Comment: (NOTE) The Xpert Xpress SARS-CoV-2/FLU/RSV plus assay is intended as an aid in the diagnosis  of influenza from Nasopharyngeal swab specimens and should not be used as a sole basis for treatment. Nasal washings and aspirates are unacceptable for Xpert Xpress SARS-CoV-2/FLU/RSV testing.  Fact Sheet for Patients: BloggerCourse.com  Fact Sheet for Healthcare Providers: SeriousBroker.it  This test is not yet approved or cleared by the [place] FDA and has been authorized for detection and/or diagnosis of SARS-CoV-2 by FDA under an Emergency  Use Authorization (EUA). This EUA will remain in effect (meaning this test can be used) for the duration of the COVID-19 declaration under Section 564(b)(1) of the Act, 21 U.S.C. section 360bbb-3(b)(1), unless the authorization is terminated or revoked.  Performed at [facility], [address], [place], [place] [address]   Admission on 11/03/2021, Discharged on 11/04/2021  Component Date Value Ref Range Status   Sodium 11/03/2021 138  135 - 145 mmol/L Final   Potassium 11/03/2021 4.0  3.5 - 5.1 mmol/L Final   Chloride 11/03/2021 111  98 - 111 mmol/L Final   CO2 11/03/2021 23  22 - 32 mmol/L Final   Glucose, Bld 11/03/2021 96  70 - 99 mg/dL Final   Glucose reference range applies only to samples taken after fasting for at least 8 hours.   BUN 11/03/2021 10  6 - 20 mg/dL Final   Creatinine, Ser 11/03/2021 0.88  0.61 - 1.24 mg/dL Final   Calcium 13/01/6577 8.9  8.9 - 10.3 mg/dL Final   Total Protein 46/96/2952 7.4  6.5 - 8.1 g/dL Final   Albumin 84/13/2440 4.7  3.5 - 5.0 g/dL Final   AST 04/09/2535 14 (L)  15 - 41 U/L Final   ALT 11/03/2021 15  0 - 44 U/L Final   Alkaline Phosphatase 11/03/2021 49  38 - 126 U/L Final   Total Bilirubin 11/03/2021 0.6  0.3 - 1.2 mg/dL Final   GFR, Estimated 11/03/2021 >60  >60 mL/min Final   Comment: (NOTE) Calculated using the CKD-EPI Creatinine Equation (2021)    Anion gap 11/03/2021 4 (L)  5 - 15 Final   Performed at [facility], [address], [place], [place] [address]   Alcohol, Ethyl (B) 11/03/2021 <10  <10 mg/dL Final   Comment: (NOTE) Lowest detectable limit for serum alcohol is 10 mg/dL.  For medical purposes only. Performed at [facility], [address], [place], [place] [address]    Opiates 11/03/2021 NONE DETECTED  NONE DETECTED Final   Cocaine 11/03/2021 NONE DETECTED  NONE DETECTED Final   Benzodiazepines 11/03/2021 NONE DETECTED  NONE DETECTED Final   Amphetamines  11/03/2021 NONE DETECTED  NONE DETECTED Final   Tetrahydrocannabinol 11/03/2021 POSITIVE (A)  NONE DETECTED Final   Barbiturates 11/03/2021 NONE DETECTED  NONE DETECTED Final   Comment: (NOTE) DRUG SCREEN FOR MEDICAL PURPOSES ONLY.  IF CONFIRMATION IS NEEDED FOR ANY PURPOSE, NOTIFY LAB WITHIN 5 DAYS.  LOWEST DETECTABLE LIMITS FOR URINE DRUG SCREEN Drug Class                     Cutoff (ng/mL) Amphetamine and metabolites    1000 Barbiturate and metabolites    200 Benzodiazepine                 200 Tricyclics and metabolites     300 Opiates and metabolites        300 Cocaine and metabolites        300 THC  50 Performed at [facility], [address], [place], [place] [address]    WBC 11/03/2021 10.7 (H)  4.0 - 10.5 K/uL Final   RBC 11/03/2021 5.12  4.22 - 5.81 MIL/uL Final   Hemoglobin 11/03/2021 15.0  13.0 - 17.0 g/dL Final   HCT 60/45/4098 45.2  39.0 - 52.0 % Final   MCV 11/03/2021 88.3  80.0 - 100.0 fL Final   MCH 11/03/2021 29.3  26.0 - 34.0 pg Final   MCHC 11/03/2021 33.2  30.0 - 36.0 g/dL Final   RDW 11/91/4782 13.6  11.5 - 15.5 % Final   Platelets 11/03/2021 228  150 - 400 K/uL Final   nRBC 11/03/2021 0.0  0.0 - 0.2 % Final   Neutrophils Relative % 11/03/2021 78  % Final   Neutro Abs 11/03/2021 8.2 (H)  1.7 - 7.7 K/uL Final   Lymphocytes Relative 11/03/2021 18  % Final   Lymphs Abs 11/03/2021 2.0  0.7 - 4.0 K/uL Final   Monocytes Relative 11/03/2021 4  % Final   Monocytes Absolute 11/03/2021 0.5  0.1 - 1.0 K/uL Final   Eosinophils Relative 11/03/2021 0  % Final   Eosinophils Absolute 11/03/2021 0.0  0.0 - 0.5 K/uL Final   Basophils Relative 11/03/2021 0  % Final   Basophils Absolute 11/03/2021 0.0  0.0 - 0.1 K/uL Final   Immature Granulocytes 11/03/2021 0  % Final   Abs Immature Granulocytes 11/03/2021 0.02  0.00 - 0.07 K/uL Final   Performed at [facility], [address] [place], [place] [address]    SARS Coronavirus 2 by RT PCR 11/03/2021 NEGATIVE  NEGATIVE Final   Comment: (NOTE) SARS-CoV-2 target nucleic acids are NOT DETECTED.  The SARS-CoV-2 RNA is generally detectable in upper respiratory specimens during the acute phase of infection. The lowest concentration of SARS-CoV-2 viral copies this assay can detect is 138 copies/mL. A negative result does not preclude SARS-Cov-2 infection and should not be used as the sole basis for treatment or other patient management decisions. A negative result may occur with  improper specimen collection/handling, submission of specimen other than nasopharyngeal swab, presence of viral mutation(s) within the areas targeted by this assay, and inadequate number of viral copies(<138 copies/mL). A negative result must be combined with clinical observations, patient history, and epidemiological information. The expected result is Negative.  Fact Sheet for Patients:  BloggerCourse.com  Fact Sheet for Healthcare Providers:  SeriousBroker.it  This test is no                          t yet approved or cleared by the [place] FDA and  has been authorized for detection and/or diagnosis of SARS-CoV-2 by FDA under an Emergency Use Authorization (EUA). This EUA will remain  in effect (meaning this test can be used) for the duration of the COVID-19 declaration under Section 564(b)(1) of the Act, 21 U.S.C.section 360bbb-3(b)(1), unless the authorization is terminated  or revoked sooner.       Influenza A by PCR 11/03/2021 NEGATIVE  NEGATIVE Final   Influenza B by PCR 11/03/2021 NEGATIVE  NEGATIVE Final   Comment: (NOTE) The Xpert Xpress SARS-CoV-2/FLU/RSV plus assay is intended as an aid in the diagnosis of influenza from Nasopharyngeal swab specimens and should not be used as a sole basis for treatment. Nasal washings and aspirates are unacceptable for Xpert Xpress  SARS-CoV-2/FLU/RSV testing.  Fact Sheet for Patients: BloggerCourse.com  Fact Sheet for Healthcare  Providers: SeriousBroker.ithttps://www.fda.gov/media/152162/download  This test is not yet approved or cleared by the [place] FDA and has been authorized for detection and/or diagnosis of SARS-CoV-2 by FDA under an Emergency Use Authorization (EUA). This EUA will remain in effect (meaning this test can be used) for the duration of the COVID-19 declaration under Section 564(b)(1) of the Act, 21 U.S.C. section 360bbb-3(b)(1), unless the authorization is terminated or revoked.  Performed at [facility], [address] [place], [place] [address]   Admission on 08/09/2021, Discharged on 08/09/2021  Component Date Value Ref Range Status   Lipase 08/09/2021 51  11 - 51 U/L Final   Performed at [facility], [address] [place], [place] [address]   Sodium 08/09/2021 140  135 - 145 mmol/L Final   Potassium 08/09/2021 4.1  3.5 - 5.1 mmol/L Final   Chloride 08/09/2021 107  98 - 111 mmol/L Final   CO2 08/09/2021 23  22 - 32 mmol/L Final   Glucose, Bld 08/09/2021 103 (H)  70 - 99 mg/dL Final   Glucose reference range applies only to samples taken after fasting for at least 8 hours.   BUN 08/09/2021 8  6 - 20 mg/dL Final   Creatinine, Ser 08/09/2021 1.04  0.61 - 1.24 mg/dL Final   Calcium 96/29/528402/27/2023 9.1  8.9 - 10.3 mg/dL Final   Total Protein 13/24/401002/27/2023 6.7  6.5 - 8.1 g/dL Final   Albumin 27/25/366402/27/2023 4.4  3.5 - 5.0 g/dL Final   AST 40/34/742502/27/2023 18  15 - 41 U/L Final   ALT 08/09/2021 22  0 - 44 U/L Final   Alkaline Phosphatase 08/09/2021 47  38 - 126 U/L Final   Total Bilirubin 08/09/2021 0.7  0.3 - 1.2 mg/dL Final   GFR, Estimated 08/09/2021 >60  >60 mL/min Final   Comment: (NOTE) Calculated using the CKD-EPI Creatinine Equation (2021)    Anion gap 08/09/2021 10  5 - 15 Final   Performed at [facility], [address] [place], [place]  [address]   WBC 08/09/2021 8.4  4.0 - 10.5 K/uL Final   RBC 08/09/2021 5.26  4.22 - 5.81 MIL/uL Final   Hemoglobin 08/09/2021 15.2  13.0 - 17.0 g/dL Final   HCT 75/64/332902/27/2023 47.1  39.0 - 52.0 % Final   MCV 08/09/2021 89.5  80.0 - 100.0 fL Final   MCH 08/09/2021 28.9  26.0 - 34.0 pg Final   MCHC 08/09/2021 32.3  30.0 - 36.0 g/dL Final   RDW 51/88/416602/27/2023 13.7  11.5 - 15.5 % Final   Platelets 08/09/2021 229  150 - 400 K/uL Final   nRBC 08/09/2021 0.0  0.0 - 0.2 % Final   Performed at [facility], [address] [place], [place] [address]   Color, Urine 08/09/2021 YELLOW  YELLOW Final   APPearance 08/09/2021 CLEAR  CLEAR Final   Specific Gravity, Urine 08/09/2021 1.019  1.005 - 1.030 Final   pH 08/09/2021 7.0  5.0 - 8.0 Final   Glucose, UA 08/09/2021 NEGATIVE  NEGATIVE mg/dL Final   Hgb urine dipstick 08/09/2021 NEGATIVE  NEGATIVE Final   Bilirubin Urine 08/09/2021 NEGATIVE  NEGATIVE Final   Ketones, ur 08/09/2021 NEGATIVE  NEGATIVE mg/dL Final   Protein, ur 60/10/932302/27/2023 NEGATIVE  NEGATIVE mg/dL Final   Nitrite 55/73/220202/27/2023 NEGATIVE  NEGATIVE Final   Leukocytes,Ua 08/09/2021 NEGATIVE  NEGATIVE Final   Performed at [facility], [address] [place], [place] [address]  Admission on 07/13/2021, Discharged  on 07/13/2021  Component Date Value Ref Range Status   Lipase 07/13/2021 63 (H)  11 - 51 U/L Final   Performed at [name], [address] [place], [place] [address]   Sodium 07/13/2021 139  135 - 145 mmol/L Final   Potassium 07/13/2021 3.9  3.5 - 5.1 mmol/L Final   Chloride 07/13/2021 107  98 - 111 mmol/L Final   CO2 07/13/2021 25  22 - 32 mmol/L Final   Glucose, Bld 07/13/2021 111 (H)  70 - 99 mg/dL Final   Glucose reference range applies only to samples taken after fasting for at least 8 hours.   BUN 07/13/2021 8  6 - 20 mg/dL Final   Creatinine, Ser 07/13/2021 1.03  0.61 - 1.24 mg/dL Final   Calcium 60/45/4098 9.2  8.9 - 10.3 mg/dL Final   Total Protein 11/91/4782  6.9  6.5 - 8.1 g/dL Final   Albumin 95/62/1308 4.3  3.5 - 5.0 g/dL Final   AST 65/78/4696 20  15 - 41 U/L Final   ALT 07/13/2021 25  0 - 44 U/L Final   Alkaline Phosphatase 07/13/2021 51  38 - 126 U/L Final   Total Bilirubin 07/13/2021 0.4  0.3 - 1.2 mg/dL Final   GFR, Estimated 07/13/2021 >60  >60 mL/min Final   Comment: (NOTE) Calculated using the CKD-EPI Creatinine Equation (2021)    Anion gap 07/13/2021 7  5 - 15 Final   Performed at [facility], [address] [place], [place] [address]   WBC 07/13/2021 14.5 (H)  4.0 - 10.5 K/uL Final   RBC 07/13/2021 5.39  4.22 - 5.81 MIL/uL Final   Hemoglobin 07/13/2021 15.9  13.0 - 17.0 g/dL Final   HCT 41/32/4401 47.6  39.0 - 52.0 % Final   MCV 07/13/2021 88.3  80.0 - 100.0 fL Final   MCH 07/13/2021 29.5  26.0 - 34.0 pg Final   MCHC 07/13/2021 33.4  30.0 - 36.0 g/dL Final   RDW 02/72/5366 13.5  11.5 - 15.5 % Final   Platelets 07/13/2021 241  150 - 400 K/uL Final   nRBC 07/13/2021 0.0  0.0 - 0.2 % Final   Performed at [facility], [address], [place], [place] [address]   Color, Urine 07/13/2021 YELLOW  YELLOW Final   APPearance 07/13/2021 CLEAR  CLEAR Final   Specific Gravity, Urine 07/13/2021 1.010  1.005 - 1.030 Final   pH 07/13/2021 6.0  5.0 - 8.0 Final   Glucose, UA 07/13/2021 NEGATIVE  NEGATIVE mg/dL Final   Hgb urine dipstick 07/13/2021 NEGATIVE  NEGATIVE Final   Bilirubin Urine 07/13/2021 NEGATIVE  NEGATIVE Final   Ketones, ur 07/13/2021 NEGATIVE  NEGATIVE mg/dL Final   Protein, ur 74/25/9563 NEGATIVE  NEGATIVE mg/dL Final   Nitrite 87/56/4332 NEGATIVE  NEGATIVE Final   Leukocytes,Ua 07/13/2021 NEGATIVE  NEGATIVE Final   Comment: Microscopic not done on urines with negative protein, blood, leukocytes, nitrite, or glucose < 500 mg/dL. Performed at [facility], [address], [place], [place] [address]    SARS Coronavirus 2 by RT PCR 07/13/2021 NEGATIVE  NEGATIVE Final   Comment: (NOTE) SARS-CoV-2 target  nucleic acids are NOT DETECTED.  The SARS-CoV-2 RNA is generally detectable in upper respiratory specimens during the acute phase of infection. The lowest concentration of SARS-CoV-2 viral copies this assay can detect is 138 copies/mL. A negative result does not preclude SARS-Cov-2 infection and should not be used as the sole basis for treatment  or other patient management decisions. A negative result may occur with  improper specimen collection/handling, submission of specimen other than nasopharyngeal swab, presence of viral mutation(s) within the areas targeted by this assay, and inadequate number of viral copies(<138 copies/mL). A negative result must be combined with clinical observations, patient history, and epidemiological information. The expected result is Negative.  Fact Sheet for Patients:  BloggerCourse.com  Fact Sheet for Healthcare Providers:  SeriousBroker.it  This test is no                          t yet approved or cleared by the [place] FDA and  has been authorized for detection and/or diagnosis of SARS-CoV-2 by FDA under an Emergency Use Authorization (EUA). This EUA will remain  in effect (meaning this test can be used) for the duration of the COVID-19 declaration under Section 564(b)(1) of the Act, 21 U.S.C.section 360bbb-3(b)(1), unless the authorization is terminated  or revoked sooner.       Influenza A by PCR 07/13/2021 NEGATIVE  NEGATIVE Final   Influenza B by PCR 07/13/2021 NEGATIVE  NEGATIVE Final   Comment: (NOTE) The Xpert Xpress SARS-CoV-2/FLU/RSV plus assay is intended as an aid in the diagnosis of influenza from Nasopharyngeal swab specimens and should not be used as a sole basis for treatment. Nasal washings and aspirates are unacceptable for Xpert Xpress SARS-CoV-2/FLU/RSV testing.  Fact Sheet for Patients: BloggerCourse.com  Fact Sheet for Healthcare  Providers: SeriousBroker.it  This test is not yet approved or cleared by the [place] FDA and has been authorized for detection and/or diagnosis of SARS-CoV-2 by FDA under an Emergency Use Authorization (EUA). This EUA will remain in effect (meaning this test can be used) for the duration of the COVID-19 declaration under Section 564(b)(1) of the Act, 21 U.S.C. section 360bbb-3(b)(1), unless the authorization is terminated or revoked.  Performed at [facility], [address], [place], [place] [address]     Allergies: Pertussis vaccines, Amoxicillin, Blue mussel [mytilus], and Penicillins  PTA Medications: (Not in a hospital admission)   Long Term Goals: Improvement in symptoms so as ready for discharge  Short Term Goals: Ability to disclose and discuss suicidal ideas and Ability to identify triggers associated with substance abuse/mental health issues will improve  Medical Decision Making  Patient admitted to the [facility] for mood stabilization and safety.   Meds ordered this encounter  Medications   acetaminophen (TYLENOL) tablet 650 mg   alum & mag hydroxide-simeth (MAALOX/MYLANTA) 200-200-20 MG/5ML suspension 30 mL   magnesium hydroxide (MILK OF MAGNESIA) suspension 30 mL   hydrOXYzine (ATARAX) tablet 25 mg   traZODone (DESYREL) tablet 50 mg    Recommendations  Based on my evaluation the patient does not appear to have an emergency medical condition.  Layla Barter, NP 11/05/21  10:41 AM

## 2021-11-05 NOTE — ED Notes (Signed)
Patient refused to get up to eat Lunch.

## 2021-11-05 NOTE — ED Notes (Signed)
Pt sleeping@this time. Breathing even and unlabored. Will continue to monitor for safety 
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
## 2021-11-05 NOTE — ED Notes (Signed)
Pt laying in bed. A&O x4, calm and cooperative. Denies SI/HI/AVH. Denies any current needs. No signs of acute distress noted. Will continue to monitor for safety.

## 2021-11-05 NOTE — ED Notes (Signed)
Pt was searched and skin assessment completed. Pt was oriented to room and unit.  He was assessed and spoke with provider.   No distress noted.

## 2021-11-05 NOTE — ED Notes (Signed)
Told patient about the group meeting, he did not want to go, I gave him the Booklet w/workbook, advised him to read it and answer the questions in it. The work book is Set Back in Recovery it covered journey to recovery, Identifying triggers, unhelpful thought patterns and coping skills, it all comes with workbooks.

## 2021-11-06 DIAGNOSIS — Z20822 Contact with and (suspected) exposure to covid-19: Secondary | ICD-10-CM | POA: Diagnosis not present

## 2021-11-06 DIAGNOSIS — F333 Major depressive disorder, recurrent, severe with psychotic symptoms: Secondary | ICD-10-CM | POA: Diagnosis not present

## 2021-11-06 DIAGNOSIS — R45851 Suicidal ideations: Secondary | ICD-10-CM | POA: Diagnosis not present

## 2021-11-06 DIAGNOSIS — F4310 Post-traumatic stress disorder, unspecified: Secondary | ICD-10-CM | POA: Diagnosis not present

## 2021-11-06 MED ORDER — OLANZAPINE 5 MG PO TABS
5.0000 mg | ORAL_TABLET | Freq: Every day | ORAL | Status: DC
  Administered 2021-11-06 – 2021-11-07 (×2): 5 mg via ORAL
  Filled 2021-11-06 (×2): qty 1

## 2021-11-06 NOTE — ED Notes (Signed)
Pt sleeping@this time. Breathing even and unlabored will continue to monitor for safety 

## 2021-11-06 NOTE — ED Provider Notes (Signed)
Behavioral Health Progress Note  Date and Time: 11/06/2021 2:11 PM Name: Jon Clayton MRN:  [id number]  Subjective:  Jon Clayton reported " I am alright, I am just waking up, so I really can't tell you how I am feeling."    Evaluation:  Jon Clayton observed resting in bed.  He is seen and evaluated face-to-face.  Patient was asked about suicidal or homicidal ideations he reports " it's early and  I just woke up, but nothing yet."  Denied auditory visual hallucinations currently.  He reports taking and tolerating medications well.  He states that he is hopeful to discharge back to [facility] however was unsure when the owner allow him back this soon.  States he will follow-up with excepting director and will let this provider know at his discharge.  11:30 -it was reported patient will not be allowed back until Tuesday of next week. Per nursing staff patient report auditory hallucinations in fear that he may be discharged to homeless shelter. NP will increase Zyprexa 2.5 mg to 5 mg nightly.    Reported symptoms appear to be secondary gain, Staff to continue to monitor for safety.  Support, encouragement and reassurance was provided.  Diagnosis:  Final diagnoses:  Suicidal ideations    Total Time spent with patient: 15 minutes  Past Psychiatric History:  Past Medical History:  Past Medical History:  Diagnosis Date   Depression    Psychosis (HCC)    No past surgical history on file. Family History: No family history on file. Family Psychiatric  History:  Social History:  Social History   Substance and Sexual Activity  Alcohol Use None     Social History   Substance and Sexual Activity  Drug Use Not on file    Social History   Socioeconomic History   Marital status: Single    Spouse name: Not on file   Number of children: Not on file   Years of education: Not on file   Highest education level: Not on file  Occupational History   Not on file  Tobacco Use   Smoking status: Not on  file   Smokeless tobacco: Not on file  Substance and Sexual Activity   Alcohol use: Not on file   Drug use: Not on file   Sexual activity: Not on file  Other Topics Concern   Not on file  Social History Narrative   Not on file   Social Determinants of Health   Financial Resource Strain: Not on file  Food Insecurity: Not on file  Transportation Needs: Not on file  Physical Activity: Not on file  Stress: Not on file  Social Connections: Not on file   SDOH:  SDOH Screenings   Alcohol Screen: Not on file  Depression (PHQ2-9): Medium Risk   PHQ-2 Score: 24  Financial Resource Strain: Not on file  Food Insecurity: Not on file  Housing: Not on file  Physical Activity: Not on file  Social Connections: Not on file  Stress: Not on file  Tobacco Use: Not on file  Transportation Needs: Not on file   Additional Social History:                         Sleep: Negative  Appetite:  Fair  Current Medications:  Current Facility-Administered Medications  Medication Dose Route Frequency Provider Last Rate Last Admin   acetaminophen (TYLENOL) tablet 650 mg  650 mg Oral Q6H PRN White, Chrystine Oiler, NP  alum & mag hydroxide-simeth (MAALOX/MYLANTA) 200-200-20 MG/5ML suspension 30 mL  30 mL Oral Q4H PRN White, Patrice L, NP       busPIRone (BUSPAR) tablet 15 mg  15 mg Oral BID Estella HuskLaubach, Katherine S, MD   15 mg at 11/06/21 [id number]   hydrOXYzine (ATARAX) tablet 25 mg  25 mg Oral TID PRN White, Patrice L, NP       magnesium hydroxide (MILK OF MAGNESIA) suspension 30 mL  30 mL Oral Daily PRN White, Patrice L, NP       nicotine (NICODERM CQ - dosed in mg/24 hours) patch 21 mg  21 mg Transdermal Daily Estella HuskLaubach, Katherine S, MD   21 mg at 11/06/21 0953   OLANZapine (ZYPREXA) tablet 5 mg  5 mg Oral QHS Oneta RackLewis, Kasson Lamere N, NP       ondansetron (ZOFRAN-ODT) disintegrating tablet 4 mg  4 mg Oral Q8H PRN Estella HuskLaubach, Katherine S, MD   4 mg at 11/05/21 1444   sertraline (ZOLOFT) tablet 50 mg  50 mg  Oral Daily Estella HuskLaubach, Katherine S, MD   50 mg at 11/06/21 0953   traZODone (DESYREL) tablet 50 mg  50 mg Oral QHS PRN White, Patrice L, NP   50 mg at 11/05/21 2116   Current Outpatient Medications  Medication Sig Dispense Refill   busPIRone (BUSPAR) 15 MG tablet Take 15 mg by mouth 2 (two) times daily as needed (for anxiety).      Labs  Lab Results:  Admission on 11/04/2021, Discharged on 11/05/2021  Component Date Value Ref Range Status   Sodium 11/04/2021 142  135 - 145 mmol/L Final   Potassium 11/04/2021 3.8  3.5 - 5.1 mmol/L Final   Chloride 11/04/2021 110  98 - 111 mmol/L Final   CO2 11/04/2021 23  22 - 32 mmol/L Final   Glucose, Bld 11/04/2021 95  70 - 99 mg/dL Final   Glucose reference range applies only to samples taken after fasting for at least 8 hours.   BUN 11/04/2021 8  6 - 20 mg/dL Final   Creatinine, Ser 11/04/2021 0.95  0.61 - 1.24 mg/dL Final   Calcium 78/46/962905/25/2023 9.7  8.9 - 10.3 mg/dL Final   Total Protein 52/84/132405/25/2023 7.9  6.5 - 8.1 g/dL Final   Albumin 40/10/272505/25/2023 4.9  3.5 - 5.0 g/dL Final   AST 36/64/403405/25/2023 15  15 - 41 U/L Final   ALT 11/04/2021 15  0 - 44 U/L Final   Alkaline Phosphatase 11/04/2021 49  38 - 126 U/L Final   Total Bilirubin 11/04/2021 1.2  0.3 - 1.2 mg/dL Final   GFR, Estimated 11/04/2021 >60  >60 mL/min Final   Comment: (NOTE) Calculated using the CKD-EPI Creatinine Equation (2021)    Anion gap 11/04/2021 9  5 - 15 Final   Performed at [facility], [address] [place], [place] [address]   Alcohol, Ethyl (B) 11/04/2021 <10  <10 mg/dL Final   Comment: (NOTE) Lowest detectable limit for serum alcohol is 10 mg/dL.  For medical purposes only. Performed at [facility], [address] [place], [place] [address]    Salicylate Lvl 11/04/2021 <7.0 (L)  7.0 - 30.0 mg/dL Final   Performed at [facility], [address] [place], [place] [address]   Acetaminophen (Tylenol), Serum 11/04/2021  <10 (L)  10 - 30 ug/mL Final   Comment: (NOTE) Therapeutic concentrations vary significantly. A range of 10-30 ug/mL  may be an effective concentration for many patients. However,  some  are best treated at concentrations outside of this range. Acetaminophen concentrations >150 ug/mL at 4 hours after ingestion  and >50 ug/mL at 12 hours after ingestion are often associated with  toxic reactions.  Performed at [facility], [address] [place], [place] [address]    WBC 11/04/2021 12.6 (H)  4.0 - 10.5 K/uL Final   RBC 11/04/2021 5.41  4.22 - 5.81 MIL/uL Final   Hemoglobin 11/04/2021 16.0  13.0 - 17.0 g/dL Final   HCT 60/45/4098 47.4  39.0 - 52.0 % Final   MCV 11/04/2021 87.6  80.0 - 100.0 fL Final   MCH 11/04/2021 29.6  26.0 - 34.0 pg Final   MCHC 11/04/2021 33.8  30.0 - 36.0 g/dL Final   RDW 11/91/4782 13.6  11.5 - 15.5 % Final   Platelets 11/04/2021 246  150 - 400 K/uL Final   nRBC 11/04/2021 0.0  0.0 - 0.2 % Final   Performed at [facility], [address] [place], [place] [address]   Opiates 11/04/2021 NONE DETECTED  NONE DETECTED Final   Cocaine 11/04/2021 NONE DETECTED  NONE DETECTED Final   Benzodiazepines 11/04/2021 NONE DETECTED  NONE DETECTED Final   Amphetamines 11/04/2021 NONE DETECTED  NONE DETECTED Final   Tetrahydrocannabinol 11/04/2021 POSITIVE (A)  NONE DETECTED Final   Barbiturates 11/04/2021 NONE DETECTED  NONE DETECTED Final   Comment: (NOTE) DRUG SCREEN FOR MEDICAL PURPOSES ONLY.  IF CONFIRMATION IS NEEDED FOR ANY PURPOSE, NOTIFY LAB WITHIN 5 DAYS.  LOWEST DETECTABLE LIMITS FOR URINE DRUG SCREEN Drug Class                     Cutoff (ng/mL) Amphetamine and metabolites    1000 Barbiturate and metabolites    200 Benzodiazepine                 200 Tricyclics and metabolites     300 Opiates and metabolites        300 Cocaine and metabolites        300 THC                            50 Performed at [facility], [address], [place], [place] [address]    SARS Coronavirus 2 by RT PCR 11/04/2021 NEGATIVE  NEGATIVE Final   Comment: (NOTE) SARS-CoV-2 target nucleic acids are NOT DETECTED.  The SARS-CoV-2 RNA is generally detectable in upper respiratory specimens during the acute phase of infection. The lowest concentration of SARS-CoV-2 viral copies this assay can detect is 138 copies/mL. A negative result does not preclude SARS-Cov-2 infection and should not be used as the sole basis for treatment or other patient management decisions. A negative result may occur with  improper specimen collection/handling, submission of specimen other than nasopharyngeal swab, presence of viral mutation(s) within the areas targeted by this assay, and inadequate number of viral copies(<138 copies/mL). A negative result must be combined with clinical observations, patient history, and epidemiological information. The expected result is Negative.  Fact Sheet for Patients:  BloggerCourse.com  Fact Sheet for Healthcare Providers:  SeriousBroker.it  This test is no                          t yet approved or cleared by the [place] FDA and  has been authorized for detection and/or diagnosis of SARS-CoV-2 by FDA under  an Emergency Use Authorization (EUA). This EUA will remain  in effect (meaning this test can be used) for the duration of the COVID-19 declaration under Section 564(b)(1) of the Act, 21 U.S.C.section 360bbb-3(b)(1), unless the authorization is terminated  or revoked sooner.       Influenza A by PCR 11/04/2021 NEGATIVE  NEGATIVE Final   Influenza B by PCR 11/04/2021 NEGATIVE  NEGATIVE Final   Comment: (NOTE) The Xpert Xpress SARS-CoV-2/FLU/RSV plus assay is intended as an aid in the diagnosis of influenza from Nasopharyngeal swab specimens and should not be used as a sole basis for treatment. Nasal washings  and aspirates are unacceptable for Xpert Xpress SARS-CoV-2/FLU/RSV testing.  Fact Sheet for Patients: BloggerCourse.com  Fact Sheet for Healthcare Providers: SeriousBroker.it  This test is not yet approved or cleared by the [place] FDA and has been authorized for detection and/or diagnosis of SARS-CoV-2 by FDA under an Emergency Use Authorization (EUA). This EUA will remain in effect (meaning this test can be used) for the duration of the COVID-19 declaration under Section 564(b)(1) of the Act, 21 U.S.C. section 360bbb-3(b)(1), unless the authorization is terminated or revoked.  Performed at [facility], [address] [place], [place] [address]   Admission on 11/03/2021, Discharged on 11/04/2021  Component Date Value Ref Range Status   Sodium 11/03/2021 138  135 - 145 mmol/L Final   Potassium 11/03/2021 4.0  3.5 - 5.1 mmol/L Final   Chloride 11/03/2021 111  98 - 111 mmol/L Final   CO2 11/03/2021 23  22 - 32 mmol/L Final   Glucose, Bld 11/03/2021 96  70 - 99 mg/dL Final   Glucose reference range applies only to samples taken after fasting for at least 8 hours.   BUN 11/03/2021 10  6 - 20 mg/dL Final   Creatinine, Ser 11/03/2021 0.88  0.61 - 1.24 mg/dL Final   Calcium 19/41/7408 8.9  8.9 - 10.3 mg/dL Final   Total Protein 14/48/1856 7.4  6.5 - 8.1 g/dL Final   Albumin 31/49/7026 4.7  3.5 - 5.0 g/dL Final   AST 37/85/8850 14 (L)  15 - 41 U/L Final   ALT 11/03/2021 15  0 - 44 U/L Final   Alkaline Phosphatase 11/03/2021 49  38 - 126 U/L Final   Total Bilirubin 11/03/2021 0.6  0.3 - 1.2 mg/dL Final   GFR, Estimated 11/03/2021 >60  >60 mL/min Final   Comment: (NOTE) Calculated using the CKD-EPI Creatinine Equation (2021)    Anion gap 11/03/2021 4 (L)  5 - 15 Final   Performed at [facility], [address], [place], [place] [address]   Alcohol, Ethyl (B) 11/03/2021 <10  <10 mg/dL  Final   Comment: (NOTE) Lowest detectable limit for serum alcohol is 10 mg/dL.  For medical purposes only. Performed at [facility], [address] [place], [place] [address]    Opiates 11/03/2021 NONE DETECTED  NONE DETECTED Final   Cocaine 11/03/2021 NONE DETECTED  NONE DETECTED Final   Benzodiazepines 11/03/2021 NONE DETECTED  NONE DETECTED Final   Amphetamines 11/03/2021 NONE DETECTED  NONE DETECTED Final   Tetrahydrocannabinol 11/03/2021 POSITIVE (A)  NONE DETECTED Final   Barbiturates 11/03/2021 NONE DETECTED  NONE DETECTED Final   Comment: (NOTE) DRUG SCREEN FOR MEDICAL PURPOSES ONLY.  IF CONFIRMATION IS NEEDED FOR ANY PURPOSE, NOTIFY LAB WITHIN 5 DAYS.  LOWEST DETECTABLE LIMITS FOR URINE DRUG SCREEN Drug Class  Cutoff (ng/mL) Amphetamine and metabolites    1000 Barbiturate and metabolites    200 Benzodiazepine                 200 Tricyclics and metabolites     300 Opiates and metabolites        300 Cocaine and metabolites        300 THC                            50 Performed at [facility], [address], [place], [place] [address]    WBC 11/03/2021 10.7 (H)  4.0 - 10.5 K/uL Final   RBC 11/03/2021 5.12  4.22 - 5.81 MIL/uL Final   Hemoglobin 11/03/2021 15.0  13.0 - 17.0 g/dL Final   HCT 60/45/4098 45.2  39.0 - 52.0 % Final   MCV 11/03/2021 88.3  80.0 - 100.0 fL Final   MCH 11/03/2021 29.3  26.0 - 34.0 pg Final   MCHC 11/03/2021 33.2  30.0 - 36.0 g/dL Final   RDW 11/91/4782 13.6  11.5 - 15.5 % Final   Platelets 11/03/2021 228  150 - 400 K/uL Final   nRBC 11/03/2021 0.0  0.0 - 0.2 % Final   Neutrophils Relative % 11/03/2021 78  % Final   Neutro Abs 11/03/2021 8.2 (H)  1.7 - 7.7 K/uL Final   Lymphocytes Relative 11/03/2021 18  % Final   Lymphs Abs 11/03/2021 2.0  0.7 - 4.0 K/uL Final   Monocytes Relative 11/03/2021 4  % Final   Monocytes Absolute 11/03/2021 0.5  0.1 - 1.0 K/uL Final   Eosinophils  Relative 11/03/2021 0  % Final   Eosinophils Absolute 11/03/2021 0.0  0.0 - 0.5 K/uL Final   Basophils Relative 11/03/2021 0  % Final   Basophils Absolute 11/03/2021 0.0  0.0 - 0.1 K/uL Final   Immature Granulocytes 11/03/2021 0  % Final   Abs Immature Granulocytes 11/03/2021 0.02  0.00 - 0.07 K/uL Final   Performed at [facility], [address], [place], [place] [address]   SARS Coronavirus 2 by RT PCR 11/03/2021 NEGATIVE  NEGATIVE Final   Comment: (NOTE) SARS-CoV-2 target nucleic acids are NOT DETECTED.  The SARS-CoV-2 RNA is generally detectable in upper respiratory specimens during the acute phase of infection. The lowest concentration of SARS-CoV-2 viral copies this assay can detect is 138 copies/mL. A negative result does not preclude SARS-Cov-2 infection and should not be used as the sole basis for treatment or other patient management decisions. A negative result may occur with  improper specimen collection/handling, submission of specimen other than nasopharyngeal swab, presence of viral mutation(s) within the areas targeted by this assay, and inadequate number of viral copies(<138 copies/mL). A negative result must be combined with clinical observations, patient history, and epidemiological information. The expected result is Negative.  Fact Sheet for Patients:  BloggerCourse.com  Fact Sheet for Healthcare Providers:  SeriousBroker.it  This test is no                          t yet approved or cleared by the [place] FDA and  has been authorized for detection and/or diagnosis of SARS-CoV-2 by FDA under an Emergency Use Authorization (EUA). This EUA will remain  in effect (meaning this test can be used) for the duration of the COVID-19 declaration under Section 564(b)(1) of the Act, 21 U.S.C.section 360bbb-3(b)(1), unless  the authorization is terminated  or revoked sooner.       Influenza A by  PCR 11/03/2021 NEGATIVE  NEGATIVE Final   Influenza B by PCR 11/03/2021 NEGATIVE  NEGATIVE Final   Comment: (NOTE) The Xpert Xpress SARS-CoV-2/FLU/RSV plus assay is intended as an aid in the diagnosis of influenza from Nasopharyngeal swab specimens and should not be used as a sole basis for treatment. Nasal washings and aspirates are unacceptable for Xpert Xpress SARS-CoV-2/FLU/RSV testing.  Fact Sheet for Patients: BloggerCourse.com  Fact Sheet for Healthcare Providers: SeriousBroker.it  This test is not yet approved or cleared by the [place] FDA and has been authorized for detection and/or diagnosis of SARS-CoV-2 by FDA under an Emergency Use Authorization (EUA). This EUA will remain in effect (meaning this test can be used) for the duration of the COVID-19 declaration under Section 564(b)(1) of the Act, 21 U.S.C. section 360bbb-3(b)(1), unless the authorization is terminated or revoked.  Performed at [facility], [address], [place], [place] [address]   Admission on 08/09/2021, Discharged on 08/09/2021  Component Date Value Ref Range Status   Lipase 08/09/2021 51  11 - 51 U/L Final   Performed at [facility], [address] [place], [place] [address]   Sodium 08/09/2021 140  135 - 145 mmol/L Final   Potassium 08/09/2021 4.1  3.5 - 5.1 mmol/L Final   Chloride 08/09/2021 107  98 - 111 mmol/L Final   CO2 08/09/2021 23  22 - 32 mmol/L Final   Glucose, Bld 08/09/2021 103 (H)  70 - 99 mg/dL Final   Glucose reference range applies only to samples taken after fasting for at least 8 hours.   BUN 08/09/2021 8  6 - 20 mg/dL Final   Creatinine, Ser 08/09/2021 1.04  0.61 - 1.24 mg/dL Final   Calcium 47/82/9562 9.1  8.9 - 10.3 mg/dL Final   Total Protein 13/01/6577 6.7  6.5 - 8.1 g/dL Final   Albumin 46/96/2952 4.4  3.5 - 5.0 g/dL Final   AST 84/13/2440 18  15 - 41 U/L Final   ALT 08/09/2021 22  0 -  44 U/L Final   Alkaline Phosphatase 08/09/2021 47  38 - 126 U/L Final   Total Bilirubin 08/09/2021 0.7  0.3 - 1.2 mg/dL Final   GFR, Estimated 08/09/2021 >60  >60 mL/min Final   Comment: (NOTE) Calculated using the CKD-EPI Creatinine Equation (2021)    Anion gap 08/09/2021 10  5 - 15 Final   Performed at [facility], [address] [place], [place] [address]   WBC 08/09/2021 8.4  4.0 - 10.5 K/uL Final   RBC 08/09/2021 5.26  4.22 - 5.81 MIL/uL Final   Hemoglobin 08/09/2021 15.2  13.0 - 17.0 g/dL Final   HCT 53/66/4403 47.1  39.0 - 52.0 % Final   MCV 08/09/2021 89.5  80.0 - 100.0 fL Final   MCH 08/09/2021 28.9  26.0 - 34.0 pg Final   MCHC 08/09/2021 32.3  30.0 - 36.0 g/dL Final   RDW 47/42/5956 13.7  11.5 - 15.5 % Final   Platelets 08/09/2021 229  150 - 400 K/uL Final   nRBC 08/09/2021 0.0  0.0 - 0.2 % Final   Performed at [facility], [address] [place], [place] [address]   Color, Urine 08/09/2021 YELLOW  YELLOW Final   APPearance 08/09/2021 CLEAR  CLEAR Final   Specific Gravity, Urine 08/09/2021 1.019  1.005 - 1.030 Final   pH  08/09/2021 7.0  5.0 - 8.0 Final   Glucose, UA 08/09/2021 NEGATIVE  NEGATIVE mg/dL Final   Hgb urine dipstick 08/09/2021 NEGATIVE  NEGATIVE Final   Bilirubin Urine 08/09/2021 NEGATIVE  NEGATIVE Final   Ketones, ur 08/09/2021 NEGATIVE  NEGATIVE mg/dL Final   Protein, ur 16/03/9603 NEGATIVE  NEGATIVE mg/dL Final   Nitrite 54/02/8118 NEGATIVE  NEGATIVE Final   Leukocytes,Ua 08/09/2021 NEGATIVE  NEGATIVE Final   Performed at [facility], [address], [place], [place] [address]  Admission on 07/13/2021, Discharged on 07/13/2021  Component Date Value Ref Range Status   Lipase 07/13/2021 63 (H)  11 - 51 U/L Final   Performed at [facility], [address], [place], [place] [address]   Sodium 07/13/2021 139  135 - 145 mmol/L Final   Potassium 07/13/2021 3.9  3.5 - 5.1 mmol/L Final   Chloride 07/13/2021 107  98 - 111 mmol/L Final    CO2 07/13/2021 25  22 - 32 mmol/L Final   Glucose, Bld 07/13/2021 111 (H)  70 - 99 mg/dL Final   Glucose reference range applies only to samples taken after fasting for at least 8 hours.   BUN 07/13/2021 8  6 - 20 mg/dL Final   Creatinine, Ser 07/13/2021 1.03  0.61 - 1.24 mg/dL Final   Calcium 30/86/5784 9.2  8.9 - 10.3 mg/dL Final   Total Protein 69/62/9528 6.9  6.5 - 8.1 g/dL Final   Albumin 41/32/4401 4.3  3.5 - 5.0 g/dL Final   AST 02/72/5366 20  15 - 41 U/L Final   ALT 07/13/2021 25  0 - 44 U/L Final   Alkaline Phosphatase 07/13/2021 51  38 - 126 U/L Final   Total Bilirubin 07/13/2021 0.4  0.3 - 1.2 mg/dL Final   GFR, Estimated 07/13/2021 >60  >60 mL/min Final   Comment: (NOTE) Calculated using the CKD-EPI Creatinine Equation (2021)    Anion gap 07/13/2021 7  5 - 15 Final   Performed at [facility], [address], [place], [place] [address]   WBC 07/13/2021 14.5 (H)  4.0 - 10.5 K/uL Final   RBC 07/13/2021 5.39  4.22 - 5.81 MIL/uL Final   Hemoglobin 07/13/2021 15.9  13.0 - 17.0 g/dL Final   HCT 74/25/9563 47.6  39.0 - 52.0 % Final   MCV 07/13/2021 88.3  80.0 - 100.0 fL Final   MCH 07/13/2021 29.5  26.0 - 34.0 pg Final   MCHC 07/13/2021 33.4  30.0 - 36.0 g/dL Final   RDW 87/56/4332 13.5  11.5 - 15.5 % Final   Platelets 07/13/2021 241  150 - 400 K/uL Final   nRBC 07/13/2021 0.0  0.0 - 0.2 % Final   Performed at [facility], [address], [place], [place] [address]   Color, Urine 07/13/2021 YELLOW  YELLOW Final   APPearance 07/13/2021 CLEAR  CLEAR Final   Specific Gravity, Urine 07/13/2021 1.010  1.005 - 1.030 Final   pH 07/13/2021 6.0  5.0 - 8.0 Final   Glucose, UA 07/13/2021 NEGATIVE  NEGATIVE mg/dL Final   Hgb urine dipstick 07/13/2021 NEGATIVE  NEGATIVE Final   Bilirubin Urine 07/13/2021 NEGATIVE  NEGATIVE Final   Ketones, ur 07/13/2021 NEGATIVE  NEGATIVE mg/dL Final   Protein, ur 41/66/0630 NEGATIVE  NEGATIVE mg/dL Final   Nitrite 16/06/930 NEGATIVE   NEGATIVE Final   Leukocytes,Ua 07/13/2021 NEGATIVE  NEGATIVE Final   Comment: Microscopic not done on urines with negative protein, blood, leukocytes, nitrite, or  glucose < 500 mg/dL. Performed at [facility], [address], [place], [place] [address]    SARS Coronavirus 2 by RT PCR 07/13/2021 NEGATIVE  NEGATIVE Final   Comment: (NOTE) SARS-CoV-2 target nucleic acids are NOT DETECTED.  The SARS-CoV-2 RNA is generally detectable in upper respiratory specimens during the acute phase of infection. The lowest concentration of SARS-CoV-2 viral copies this assay can detect is 138 copies/mL. A negative result does not preclude SARS-Cov-2 infection and should not be used as the sole basis for treatment or other patient management decisions. A negative result may occur with  improper specimen collection/handling, submission of specimen other than nasopharyngeal swab, presence of viral mutation(s) within the areas targeted by this assay, and inadequate number of viral copies(<138 copies/mL). A negative result must be combined with clinical observations, patient history, and epidemiological information. The expected result is Negative.  Fact Sheet for Patients:  BloggerCourse.com  Fact Sheet for Healthcare Providers:  SeriousBroker.it  This test is no                          t yet approved or cleared by the [place] FDA and  has been authorized for detection and/or diagnosis of SARS-CoV-2 by FDA under an Emergency Use Authorization (EUA). This EUA will remain  in effect (meaning this test can be used) for the duration of the COVID-19 declaration under Section 564(b)(1) of the Act, 21 U.S.C.section 360bbb-3(b)(1), unless the authorization is terminated  or revoked sooner.       Influenza A by PCR 07/13/2021 NEGATIVE  NEGATIVE Final   Influenza B by PCR 07/13/2021 NEGATIVE  NEGATIVE Final   Comment: (NOTE) The Xpert Xpress  SARS-CoV-2/FLU/RSV plus assay is intended as an aid in the diagnosis of influenza from Nasopharyngeal swab specimens and should not be used as a sole basis for treatment. Nasal washings and aspirates are unacceptable for Xpert Xpress SARS-CoV-2/FLU/RSV testing.  Fact Sheet for Patients: BloggerCourse.com  Fact Sheet for Healthcare Providers: SeriousBroker.it  This test is not yet approved or cleared by the [place] FDA and has been authorized for detection and/or diagnosis of SARS-CoV-2 by FDA under an Emergency Use Authorization (EUA). This EUA will remain in effect (meaning this test can be used) for the duration of the COVID-19 declaration under Section 564(b)(1) of the Act, 21 U.S.C. section 360bbb-3(b)(1), unless the authorization is terminated or revoked.  Performed at [facility], [address] [place], [place] [address]     Blood Alcohol level:  Lab Results  Component Value Date   ETH <10 11/04/2021   ETH <10 11/03/2021    Metabolic Disorder Labs: No results found for: HGBA1C, MPG No results found for: PROLACTIN No results found for: CHOL, TRIG, HDL, CHOLHDL, VLDL, LDLCALC  Therapeutic Lab Levels: No results found for: LITHIUM No results found for: VALPROATE No components found for:  CBMZ  Physical Findings   PHQ2-9    Flowsheet Row ED from 11/04/2021 in [facility] from 11/03/2021 in [facility]  PHQ-2 Total Score 6 6  PHQ-9 Total Score 24 25      Flowsheet Row ED from 11/04/2021 in [facility] from 11/03/2021 in [facility] ED from 08/09/2021 in [facility] EMERGENCY DEPARTMENT  C-SSRS RISK CATEGORY High Risk High Risk No Risk        Musculoskeletal  Strength & Muscle  Tone: within normal limits Gait & Station: normal Patient leans:  N/A  Psychiatric Specialty Exam  Presentation  General Appearance: Appropriate for Environment  Eye Contact:Good  Speech:Clear and Coherent  Speech Volume:Normal  Handedness:Right   Mood and Affect  Mood:Anxious  Affect:Congruent   Thought Process  Thought Processes:Coherent  Descriptions of Associations:Intact  Orientation:Full (Time, Place and Person)  Thought Content:Logical  Diagnosis of Schizophrenia or Schizoaffective disorder in past: No    Hallucinations:Hallucinations: None  Ideas of Reference:None  Suicidal Thoughts:Suicidal Thoughts: No  Homicidal Thoughts:Homicidal Thoughts: No   Sensorium  Memory:Immediate Fair; Recent Fair  Judgment:Fair  Insight:Fair   Executive Functions  Concentration:Good  Attention Span:Fair  Recall:Fair  Fund of Knowledge:Fair  Language:Good   Psychomotor Activity  Psychomotor Activity:Psychomotor Activity: Normal   Assets  Assets:Social Support; Desire for Improvement   Sleep  Sleep:Sleep: Fair   Nutritional Assessment (For [facility]BS and [facility] admissions only) Has the patient had a weight loss or gain of 10 pounds or more in the last 3 months?: No Has the patient had a decrease in food intake/or appetite?: No Does the patient have dental problems?: No Does the patient have eating habits or behaviors that may be indicators of an eating disorder including binging or inducing vomiting?: No Has the patient recently lost weight without trying?: 0 Has the patient been eating poorly because of a decreased appetite?: 0 Malnutrition Screening Tool Score: 0    Physical Exam  Physical Exam Vitals and nursing note reviewed.  Cardiovascular:     Rate and Rhythm: Normal rate and regular rhythm.  Neurological:     Mental Status: He is alert and oriented to person, place, and time.  Psychiatric:        Mood and Affect: Mood normal.        Behavior: Behavior normal.   Review of Systems  Eyes: Negative.    Cardiovascular: Negative.   Genitourinary: Negative.   Psychiatric/Behavioral:  Positive for depression, hallucinations and substance abuse. The patient is nervous/anxious.   All other systems reviewed and are negative. Blood pressure (!) 105/92, pulse (!) 57, temperature (!) 97.4 F (36.3 C), temperature source Oral, resp. rate 18, SpO2 95 %. There is no height or weight on file to calculate BMI.  Treatment Plan Summary: Daily contact with patient to assess and evaluate symptoms and progress in treatment and Medication management  Continue with current treatment plan on 11/06/2021 as listed below excepted where noted.  Increased Olanzapine 2.5 mg to 5 mg nightly for mood stabilization Continue Buspar 15 mg po bID Continue Sertraline 50 mg daily    CSW to continue working on discharge disposition Patient was encouraged  to participate in the therapeutic  milieu   Oneta Rack, NP 11/06/2021 2:11 PM

## 2021-11-06 NOTE — ED Notes (Signed)
Pt asleep in bed. Respirations even and unlabored. Will continue to monitor for safety. ?

## 2021-11-06 NOTE — ED Notes (Signed)
Pt laying in room sleeping but easily aroused to name being called. Pt did not eat dinner. Pt stated, "I'm not hungry. Flat affect. Denies SI but continue to endorse hallucinations, no commands. Writer informed pt that his Zyprexa was increased from 2.5mg  to 5 mg to start tonight. Pt appreciative. Informed pt to notify staff with any impulses or urges to harm self. Verbalized agreement. Will continue to monitor for safety.

## 2021-11-06 NOTE — ED Notes (Signed)
Patient A&Ox4. Denies intent to harm self/others when asked. Pt endorsed AH. Pt states, I know him. It's someone I grew up with. He tells me to do things, sometimes to hurt myself. Would you tell the doctor?. I need to stay here about 2-3 more days, then I can return to the [facility]. They told me I had to come here for help for about 4 days, then I can return. I want to make sure that the medicine work first. I don't want to go to a shelter". Patient denies any physical complaints when asked. Support and encouragement provided. Encouraged pt to utilize distraction techniques. Pt sitting in lounge area looking at magazines. Routine safety checks conducted according to facility protocol. Encouraged patient to notify staff if thoughts of harm toward self or others arise. Patient verbalize understanding and agreement. Will continue to monitor for safety.

## 2021-11-06 NOTE — ED Notes (Signed)
Pt requested for sleep medication to help him to sleep.

## 2021-11-06 NOTE — ED Notes (Signed)
Pt refused to eat dinner, requested for juice only

## 2021-11-06 NOTE — ED Notes (Signed)
Jaymie is attending the AA meeting.

## 2021-11-06 NOTE — ED Notes (Signed)
Snacks given 

## 2021-11-06 NOTE — ED Notes (Signed)
Pt sitting in his room calm and cooperative. He is avoidant of other patients. Will continue to monitor for safety

## 2021-11-06 NOTE — ED Notes (Signed)
Pt sleeping in no acute distress. RR even and unlabored. Safety maintained. 

## 2021-11-07 DIAGNOSIS — R45851 Suicidal ideations: Secondary | ICD-10-CM | POA: Diagnosis not present

## 2021-11-07 DIAGNOSIS — F333 Major depressive disorder, recurrent, severe with psychotic symptoms: Secondary | ICD-10-CM | POA: Diagnosis not present

## 2021-11-07 DIAGNOSIS — Z20822 Contact with and (suspected) exposure to covid-19: Secondary | ICD-10-CM | POA: Diagnosis not present

## 2021-11-07 DIAGNOSIS — F4310 Post-traumatic stress disorder, unspecified: Secondary | ICD-10-CM | POA: Diagnosis not present

## 2021-11-07 MED ORDER — ENSURE ENLIVE PO LIQD
237.0000 mL | Freq: Two times a day (BID) | ORAL | Status: DC
  Administered 2021-11-07 – 2021-11-09 (×5): 237 mL via ORAL
  Filled 2021-11-07: qty 237

## 2021-11-07 NOTE — ED Notes (Signed)
Pt is currently sleeping, no distress noted, environmental check complete, will continue to monitor patient for safety. ? ?

## 2021-11-07 NOTE — ED Notes (Signed)
Patient woke up and is in the day room watching TV, he ate some of his lunch. Patient requested Boost or Ensure since he feels he is not eating properly. I will send a message to the provider. Will monitor for safety.

## 2021-11-07 NOTE — ED Notes (Signed)
Pt requested for medication to help him sleep. 

## 2021-11-07 NOTE — ED Notes (Signed)
Pt is in the bed sleeping. Respirations are even and unlabored. No acute distress noted. Will continue to monitor for safety. 

## 2021-11-07 NOTE — ED Notes (Signed)
Patient is resting quietly at this time no S/S of distress, respirations are even and unlabored. Will continue to monitor for safety.

## 2021-11-07 NOTE — ED Notes (Signed)
Pt was notified that breakfast is ready 

## 2021-11-07 NOTE — ED Provider Notes (Signed)
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 [address]     Blood Alcohol level:  Lab Results  Component Value Date   ETH <10 11/04/2021   ETH <10 [id number]    Metabolic Disorder Labs: No results found for: HGBA1C, MPG No results found for: PROLACTIN No results found  for: CHOL, TRIG,  HDL, CHOLHDL, VLDL, LDLCALC  Therapeutic Lab Levels: No results found for: LITHIUM No results found for: VALPROATE No components found for:  CBMZ  Physical Findings   PHQ2-9    Flowsheet Row ED from 11/04/2021 in [facility] ED from 11/03/2021 in [facility]  PHQ-2 Total Score 6 6  PHQ-9 Total Score 24 25      Flowsheet Row ED from 11/04/2021 in [facility] ED from 11/03/2021 in [facility] ED from 08/09/2021 in [facility] High Risk High Risk No Risk        Musculoskeletal  Strength & Muscle Tone: within normal limits Gait & Station: normal Patient leans: N/A  Psychiatric Specialty Exam  Presentation  General Appearance: Appropriate for Environment  Eye Contact:Fair  Speech:Clear and Coherent  Speech Volume:Normal  Handedness:Right   Mood and Affect  Mood:Depressed  Affect:Congruent   Thought Process  Thought Processes:Coherent; Goal Directed  Descriptions of Associations:Intact  Orientation:Full (Time, Place and Person)  Thought Content:Logical  Diagnosis of Schizophrenia or Schizoaffective disorder in past: No    Hallucinations:Hallucinations: None  Ideas of Reference:None  Suicidal Thoughts:Suicidal Thoughts: No  Homicidal Thoughts:Homicidal Thoughts: No   Sensorium  Memory:Immediate Fair; Recent Fair  Judgment:Intact  Insight:Present   Executive Functions  Concentration:Fair  Attention Span:Fair  [place]   Psychomotor Activity  Psychomotor Activity:Psychomotor Activity: Normal   Assets  Assets:Desire for Improvement; Armed forces logistics/support/administrative officer; Social Support   Sleep  Sleep:Sleep: Fair   Nutritional Assessment (For [facility] and [facility] admissions only) Has the patient had a weight  loss or gain of 10 pounds or more in the last 3 months?: No Has the patient had a decrease in food intake/or appetite?: No Does the patient have dental problems?: No Does the patient have eating habits or behaviors that may be indicators of an eating disorder including binging or inducing vomiting?: No Has the patient recently lost weight without trying?: 0 Has the patient been eating poorly because of a decreased appetite?: 0 Malnutrition Screening Tool Score: 0    Physical Exam  Physical Exam HENT:     Head: Normocephalic.     Nose: Nose normal.  Eyes:     Conjunctiva/sclera: Conjunctivae normal.  Cardiovascular:     Rate and Rhythm: Normal rate.  Pulmonary:     Effort: Pulmonary effort is normal.  Musculoskeletal:        General: Normal range of motion.     Cervical back: Normal range of motion.  Neurological:     Mental Status: He is alert and oriented to person, place, and time.   Review of Systems  Constitutional: Negative.   HENT: Negative.    Eyes: Negative.   Respiratory: Negative.    Cardiovascular: Negative.   Gastrointestinal: Negative.   Genitourinary: Negative.   Musculoskeletal: Negative.   Skin: Negative.   Neurological: Negative.   Endo/Heme/Allergies: Negative.   Blood pressure 106/69, pulse 65, temperature (!) 96.8 F (36 C), temperature source Oral, resp. rate 16, SpO2 98 %. There is no height or weight on file to calculate BMI.  Treatment Plan Summary: Patient admitted to the [facility] for crisis stabilization.   PTSD MDD with psychosis Anxiety -continue Zoloft 50 mg for mood, PTSD -continue buspar 15 mg BID for anxiety -continue zyprexa 5 mg qhs as adjunct for mood  Nutrition: Added ensure supplement BID for poor appetite.  Dispo: ongoing. Pending improvement in symptomatology. Likely [facility] to [facility] if able to return. Per chart review: it was reported patient will not be allowed back until Tuesday of next week.    Detravion Tester  L, NP 11/07/2021 1:15 PM

## 2021-11-07 NOTE — ED Notes (Signed)
Patient was able to eat part of his lunch "I ate all of the roast beef". He has an order for Ensure but he is sleeping at this time. Will monitor

## 2021-11-08 LAB — URINALYSIS, ROUTINE W REFLEX MICROSCOPIC
Bilirubin Urine: NEGATIVE
Glucose, UA: NEGATIVE mg/dL
Hgb urine dipstick: NEGATIVE
Ketones, ur: NEGATIVE mg/dL
Leukocytes,Ua: NEGATIVE
Nitrite: NEGATIVE
Protein, ur: NEGATIVE mg/dL
Specific Gravity, Urine: 1.009 (ref 1.005–1.030)
pH: 6 (ref 5.0–8.0)

## 2021-11-08 MED ORDER — OLANZAPINE 5 MG PO TABS
5.0000 mg | ORAL_TABLET | Freq: Two times a day (BID) | ORAL | Status: DC
  Administered 2021-11-08 – 2021-11-09 (×3): 5 mg via ORAL
  Filled 2021-11-08 (×3): qty 1

## 2021-11-08 NOTE — ED Notes (Signed)
Patient is resting quietly with eyes closed, no s/s of distress., respirations even and unlabored. Will monitor
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
## 2021-11-08 NOTE — ED Notes (Signed)
Pt sleeping in no acute distress. RR even and unlabored. Safety maintained. 

## 2021-11-08 NOTE — ED Notes (Signed)
Lab called to pick up specimen

## 2021-11-08 NOTE — ED Notes (Signed)
Patient A&Ox4. Denies intent to harm self/others when asked. Denies A/VH. Patient denies any physical complaints when asked. No acute distress noted. Pt states, "I slept hard last night". Received all am medication without difficulty. Pt drank Ensure nutritional supplement without difficulty. Routine safety checks conducted according to facility protocol. Encouraged patient to notify staff if thoughts of harm toward self or others arise. Patient verbalize understanding and agreement. Will continue to monitor for safety.

## 2021-11-08 NOTE — ED Notes (Signed)
Pt ate 100% of dinner. No acute distress noted. Denies SI/HI/AVH at present. Safety maintained.

## 2021-11-08 NOTE — ED Notes (Signed)
Pt ate all his lunch and went back to room

## 2021-11-08 NOTE — ED Notes (Signed)
Pt is in the bed sleeping. Respirations are even and unlabored. No acute distress noted. Will continue to monitor for safety. 

## 2021-11-08 NOTE — ED Notes (Signed)
Patient is resting quietly in room. Was watching TV earlier. No S/S of distress. Will monitor

## 2021-11-08 NOTE — ED Notes (Signed)
Pt in room sitting on bed. Denies needs at present. Pt states, "I have a friend that's n my head named Fayrene Fearing. We grew up together. Sometimes he talk to me. Right now, I see him but he's not talking to me. That's good (smiling). Informed pt that the Zyprexa medication that he just took should help fade out the voices and visions. Pt verbalized appreciation for the assistance. Informed pt to notify staff if hallucinations become worse or for any needs. Verbalized understanding and agreement. Will continue to monitor for safety.

## 2021-11-08 NOTE — ED Notes (Signed)
Pt refused breakfast 

## 2021-11-08 NOTE — ED Notes (Signed)
Pt is currently in his room sleeping, no distress noted, will continue to monitor patient for safety

## 2021-11-09 ENCOUNTER — Other Ambulatory Visit: Payer: Self-pay

## 2021-11-09 ENCOUNTER — Inpatient Hospital Stay (HOSPITAL_COMMUNITY)
Admission: AD | Admit: 2021-11-09 | Discharge: 2021-11-18 | DRG: 885 | Disposition: A | Discharge status: Home / Self Care | Payer: Medicaid Other | Source: Intra-hospital | Attending: Psychiatry | Admitting: Psychiatry

## 2021-11-09 ENCOUNTER — Encounter (HOSPITAL_COMMUNITY): Payer: Self-pay | Admitting: Psychiatry

## 2021-11-09 DIAGNOSIS — Z23 Encounter for immunization: Secondary | ICD-10-CM

## 2021-11-09 DIAGNOSIS — Z9152 Personal history of nonsuicidal self-harm: Secondary | ICD-10-CM | POA: Diagnosis not present

## 2021-11-09 DIAGNOSIS — R45851 Suicidal ideations: Secondary | ICD-10-CM | POA: Diagnosis present

## 2021-11-09 DIAGNOSIS — Z653 Problems related to other legal circumstances: Secondary | ICD-10-CM

## 2021-11-09 DIAGNOSIS — R45850 Homicidal ideations: Secondary | ICD-10-CM | POA: Diagnosis present

## 2021-11-09 DIAGNOSIS — Z560 Unemployment, unspecified: Secondary | ICD-10-CM | POA: Diagnosis not present

## 2021-11-09 DIAGNOSIS — Z880 Allergy status to penicillin: Secondary | ICD-10-CM | POA: Diagnosis not present

## 2021-11-09 DIAGNOSIS — F333 Major depressive disorder, recurrent, severe with psychotic symptoms: Secondary | ICD-10-CM | POA: Diagnosis not present

## 2021-11-09 DIAGNOSIS — Z818 Family history of other mental and behavioral disorders: Secondary | ICD-10-CM | POA: Diagnosis not present

## 2021-11-09 DIAGNOSIS — M797 Fibromyalgia: Secondary | ICD-10-CM | POA: Diagnosis present

## 2021-11-09 DIAGNOSIS — Z20822 Contact with and (suspected) exposure to covid-19: Secondary | ICD-10-CM | POA: Diagnosis not present

## 2021-11-09 DIAGNOSIS — Z716 Tobacco abuse counseling: Secondary | ICD-10-CM

## 2021-11-09 DIAGNOSIS — F17210 Nicotine dependence, cigarettes, uncomplicated: Secondary | ICD-10-CM | POA: Diagnosis present

## 2021-11-09 DIAGNOSIS — Z91018 Allergy to other foods: Secondary | ICD-10-CM | POA: Diagnosis not present

## 2021-11-09 DIAGNOSIS — F4310 Post-traumatic stress disorder, unspecified: Secondary | ICD-10-CM

## 2021-11-09 DIAGNOSIS — R4587 Impulsiveness: Secondary | ICD-10-CM | POA: Diagnosis present

## 2021-11-09 DIAGNOSIS — F429 Obsessive-compulsive disorder, unspecified: Secondary | ICD-10-CM | POA: Diagnosis not present

## 2021-11-09 DIAGNOSIS — F1910 Other psychoactive substance abuse, uncomplicated: Secondary | ICD-10-CM | POA: Diagnosis not present

## 2021-11-09 DIAGNOSIS — Z887 Allergy status to serum and vaccine status: Secondary | ICD-10-CM

## 2021-11-09 DIAGNOSIS — F449 Dissociative and conversion disorder, unspecified: Secondary | ICD-10-CM | POA: Diagnosis present

## 2021-11-09 DIAGNOSIS — F1990 Other psychoactive substance use, unspecified, uncomplicated: Secondary | ICD-10-CM

## 2021-11-09 DIAGNOSIS — Z62810 Personal history of physical and sexual abuse in childhood: Secondary | ICD-10-CM | POA: Diagnosis present

## 2021-11-09 DIAGNOSIS — Z9151 Personal history of suicidal behavior: Secondary | ICD-10-CM

## 2021-11-09 DIAGNOSIS — Z881 Allergy status to other antibiotic agents status: Secondary | ICD-10-CM

## 2021-11-09 LAB — GC/CHLAMYDIA PROBE AMP (~~LOC~~) NOT AT ARMC
Chlamydia: NEGATIVE
Comment: NEGATIVE
Comment: NORMAL
Neisseria Gonorrhea: NEGATIVE

## 2021-11-09 LAB — POC SARS CORONAVIRUS 2 AG: SARSCOV2ONAVIRUS 2 AG: NEGATIVE

## 2021-11-09 MED ORDER — TRAZODONE HCL 50 MG PO TABS: 50.0000 mg | ORAL_TABLET | Freq: Every evening | ORAL | Status: DC | PRN

## 2021-11-09 MED ORDER — ENSURE ENLIVE PO LIQD
237.0000 mL | Freq: Two times a day (BID) | ORAL | 12 refills | Status: DC
Start: 2021-11-10 — End: 2021-11-18

## 2021-11-09 MED ORDER — BUSPIRONE HCL 15 MG PO TABS
15.0000 mg | ORAL_TABLET | Freq: Two times a day (BID) | ORAL | Status: DC
  Administered 2021-11-09 – 2021-11-18 (×18): 15 mg via ORAL
  Filled 2021-11-09 (×21): qty 1

## 2021-11-09 MED ORDER — TRIPLE ANTIBIOTIC 3.5-400-5000 EX OINT: 1.0000 "application " | TOPICAL_OINTMENT | Freq: Every day | CUTANEOUS | Status: DC | PRN

## 2021-11-09 MED ORDER — OLANZAPINE 5 MG PO TABS: 5.0000 mg | ORAL_TABLET | Freq: Two times a day (BID) | ORAL | Status: DC

## 2021-11-09 MED ORDER — TRIPLE ANTIBIOTIC 3.5-400-5000 EX OINT: 1.0000 "application " | TOPICAL_OINTMENT | Freq: Every day | CUTANEOUS | 0 refills | Status: DC | PRN

## 2021-11-09 MED ORDER — OLANZAPINE 5 MG PO TABS
5.0000 mg | ORAL_TABLET | Freq: Two times a day (BID) | ORAL | Status: DC
  Administered 2021-11-09 – 2021-11-10 (×2): 5 mg via ORAL
  Filled 2021-11-09 (×7): qty 1

## 2021-11-09 MED ORDER — BUSPIRONE HCL 15 MG PO TABS: 15.0000 mg | ORAL_TABLET | Freq: Two times a day (BID) | ORAL | Status: DC

## 2021-11-09 MED ORDER — HYDROXYZINE HCL 25 MG PO TABS
25.0000 mg | ORAL_TABLET | Freq: Three times a day (TID) | ORAL | Status: DC | PRN
  Administered 2021-11-10 – 2021-11-17 (×10): 25 mg via ORAL
  Filled 2021-11-09 (×8): qty 1
  Filled 2021-11-09: qty 10
  Filled 2021-11-09 (×2): qty 1

## 2021-11-09 MED ORDER — TRIPLE ANTIBIOTIC 3.5-400-5000 EX OINT
1.0000 "application " | TOPICAL_OINTMENT | Freq: Every day | CUTANEOUS | Status: DC | PRN
  Filled 2021-11-09: qty 1

## 2021-11-09 MED ORDER — SERTRALINE HCL 50 MG PO TABS: 50.0000 mg | ORAL_TABLET | Freq: Every day | ORAL | Status: DC

## 2021-11-09 MED ORDER — NICOTINE 21 MG/24HR TD PT24: 21.0000 mg | MEDICATED_PATCH | Freq: Every day | TRANSDERMAL | 0 refills | Status: DC

## 2021-11-09 MED ORDER — NICOTINE 21 MG/24HR TD PT24
21.0000 mg | MEDICATED_PATCH | Freq: Every day | TRANSDERMAL | Status: DC
Start: 2021-11-10 — End: 2021-11-18
  Administered 2021-11-10 – 2021-11-18 (×9): 21 mg via TRANSDERMAL
  Filled 2021-11-09 (×10): qty 1

## 2021-11-09 MED ORDER — ALUM & MAG HYDROXIDE-SIMETH 200-200-20 MG/5ML PO SUSP: 30.0000 mL | ORAL | Status: DC | PRN

## 2021-11-09 MED ORDER — MAGNESIUM HYDROXIDE 400 MG/5ML PO SUSP: 30.0000 mL | Freq: Every day | ORAL | Status: DC | PRN

## 2021-11-09 MED ORDER — ACETAMINOPHEN 325 MG PO TABS
650.0000 mg | ORAL_TABLET | Freq: Four times a day (QID) | ORAL | Status: DC | PRN
  Administered 2021-11-10 – 2021-11-17 (×10): 650 mg via ORAL
  Filled 2021-11-09 (×10): qty 2

## 2021-11-09 MED ORDER — TRAZODONE HCL 50 MG PO TABS
50.0000 mg | ORAL_TABLET | Freq: Every evening | ORAL | Status: DC | PRN
  Administered 2021-11-10 – 2021-11-17 (×8): 50 mg via ORAL
  Filled 2021-11-09: qty 7
  Filled 2021-11-09 (×8): qty 1

## 2021-11-09 NOTE — ED Notes (Signed)
Pt got yoghurt for breakfast
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
## 2021-11-09 NOTE — Discharge Instructions (Addendum)
Transfer to [facility]

## 2021-11-09 NOTE — [facility] Group Notes (Signed)
Patient did not attend the Wrap-Up group. 

## 2021-11-09 NOTE — ED Notes (Signed)
Pt. Attended group we talked about how to move forward and goals to help him along the way.

## 2021-11-09 NOTE — Group Note (Signed)
Group Topic: Emotional Regulation  Group Date: 11/09/2021 Start Time: 1225 End Time: 1300 Facilitators: Levander Campion  Department: [facility]  Number of Participants: 2  Group Focus: clarity of thought, coping skills, daily focus, and feeling awareness/expression Treatment Modality:  Individual Therapy, Patient-Centered Therapy, and Psychoeducation Interventions utilized were exploration, patient education, and problem solving Purpose: enhance coping skills and increase insight  Name: Jon Clayton Date of Birth: 10-07-1995  MR: [id number]    Level of Participation: active Quality of Participation: attentive and cooperative Interactions with others: gave feedback Mood/Affect: appropriate Triggers (if applicable): n/a Cognition: coherent/clear Progress: Moderate Response: n/a Plan: follow-up needed  Patients Problems:  Patient Active Problem List   Diagnosis Date Noted   Suicidal ideations 11/05/2021   MDD (major depressive disorder), recurrent episode, severe (HCC) 11/04/2021   PTSD (post-traumatic stress disorder) 11/04/2021

## 2021-11-09 NOTE — Progress Notes (Signed)
Patient accepted to [facility].  He is aware and accepting of plan.  V.s. 135/89 80 98%r/a 18 97.9.  EKG completed and 15 minute covid test negative.

## 2021-11-09 NOTE — ED Notes (Signed)
Patient remains asleep in bed at this time.  No complaint or distress.  Denies avh shi or plan.  Will monitor and provide safe environment.

## 2021-11-09 NOTE — Clinical Social Work Psych Note (Signed)
LCSW Update   Per Dr. Bronwen Betters, MD, the patient meets criteria for inpatient treatment services.   Patient accepted to [facility], bed 406-1.   Accepting/Attending provider is Dr. Octavia Bruckner, MD.  Patient can arrive at anytime after 3:00PM.   Nurse to Nurse report # [phone]     Baldo Daub, MSW, LCSW Clinical Social Worker (Facility Based Crisis) [facility]

## 2021-11-09 NOTE — ED Notes (Signed)
Pt was notified that breakfast is ready 
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
## 2021-11-10 ENCOUNTER — Encounter (HOSPITAL_COMMUNITY): Payer: Self-pay

## 2021-11-10 DIAGNOSIS — F1990 Other psychoactive substance use, unspecified, uncomplicated: Secondary | ICD-10-CM

## 2021-11-10 DIAGNOSIS — F333 Major depressive disorder, recurrent, severe with psychotic symptoms: Principal | ICD-10-CM

## 2021-11-10 LAB — LIPID PANEL
Cholesterol: 111 mg/dL (ref 0–200)
HDL: 36 mg/dL — ABNORMAL LOW (ref >40)
LDL Cholesterol: 55 mg/dL (ref 0–99)
Total CHOL/HDL Ratio: 3.1 RATIO
Triglycerides: 98 mg/dL (ref <150)
VLDL: 20 mg/dL (ref 0–40)

## 2021-11-10 LAB — HEMOGLOBIN A1C
Hgb A1c MFr Bld: 4.9 % (ref 4.8–5.6)
Mean Plasma Glucose: 93.93 mg/dL

## 2021-11-10 MED ORDER — OLANZAPINE 10 MG PO TABS
10.0000 mg | ORAL_TABLET | Freq: Two times a day (BID) | ORAL | Status: DC
  Administered 2021-11-10 – 2021-11-12 (×4): 10 mg via ORAL
  Filled 2021-11-10 (×7): qty 1

## 2021-11-10 MED ORDER — DULOXETINE HCL 60 MG PO CPEP
60.0000 mg | ORAL_CAPSULE | Freq: Every day | ORAL | Status: DC
Start: 2021-11-10 — End: 2021-11-18
  Administered 2021-11-10 – 2021-11-18 (×9): 60 mg via ORAL
  Filled 2021-11-10 (×11): qty 1
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
## 2021-11-10 NOTE — Group Note (Signed)
Recreation Therapy Group Note   Group Topic:Team Building  Group Date: 11/10/2021 Start Time: 0930 End Time: 0956 Facilitators: Caroll Rancher, LRT,CTRS Location: [address] Dayroom   Goal Area(s) Addresses:  Patient will effectively work with peer towards shared goal.  Patient will identify skills used to make activity successful.  Patient will identify how skills used during activity can be applied to reach post d/c goals.   Group Description: [employer]. In teams of 3-4, patients were given 25 small craft pipe cleaners. Using the materials provided, patients were instructed to compete again the opposing team(s) to build the tallest free-standing structure from floor level. The activity was timed; difficulty increased by Clinical research associate as Production designer, theatre/television/film continued.  Systematically resources were removed with additional directions for example, placing one arm behind their back, working in silence, and shape stipulations. LRT facilitated post-activity discussion reviewing team processes and necessary communication skills involved in completion. Patients were encouraged to reflect how the skills utilized, or not utilized, in this activity can be incorporated to positively impact support systems post discharge.   Affect/Mood: N/A   Participation Level: Did not attend    Clinical Observations/Individualized Feedback:     Plan: Continue to engage patient in RT group sessions 2-3x/week.   Caroll Rancher, Antonietta Jewel 11/10/2021 12:34 PM
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
## 2021-11-10 NOTE — Progress Notes (Signed)
   11/10/21 0400  Psych Admission Type (Psych Patients Only)  Admission Status Voluntary  Psychosocial Assessment  Patient Complaints Anxiety;Depression;Hopelessness  Eye Contact Brief  Facial Expression Flat;Sad  Affect Anxious;Sad;Depressed  Speech Logical/coherent  Interaction Avoidant;Minimal  Motor Activity Slow  Appearance/Hygiene Disheveled  Behavior Characteristics Cooperative;Appropriate to situation  Mood Depressed;Anxious;Despair  Thought Process  Coherency WDL  Content WDL  Delusions UTA  Perception UTA  Hallucination None reported or observed  Judgment Impaired  Confusion UTA  Danger to Self  Current suicidal ideation? Denies  Agreement Not to Harm Self Yes  Description of Agreement verbal  Danger to Others  Danger to Others None reported or observed

## 2021-11-10 NOTE — [facility] Group Notes (Signed)
Pt did not attend psychoeducational group. 

## 2021-11-10 NOTE — Progress Notes (Signed)
Pt endorses SI and is vague when talking about HI.    Pt says, "there's many ways I could kill myself, but I won't."  Pt contracts for safety while in the hospital.  Pt says he has no active plan or intent to kill himself or anyone else. Pt says he feels depressed and overwhelmed.  Pt stays in his room for most of the shift.  Pt has self-inflicted wound on left arm.  Pt cleaned and dried his arm.  RN applied abx ointment and covered area with dressing.  RN will continue to monitor pt's progress and provide support as indicated.

## 2021-11-10 NOTE — Plan of Care (Signed)
  Problem: Coping: Goal: Will verbalize feelings Outcome: Progressing   Problem: Skin Integrity: Goal: Risk for impaired skin integrity will decrease Outcome: Not Progressing   Problem: Coping: Goal: Coping ability will improve Outcome: Not Progressing

## 2021-11-10 NOTE — Group Note (Signed)
LCSW Group Therapy Note   Group Date: 11/10/2021 Start Time: 1300 End Time: 1400   Type of Therapy and Topic:  Group Therapy:  Strengths Exploration   Participation Level: Did Not Attend  Description of Group: This group allows individuals to explore their strengths, learn to use strengths in new ways to improve well-being. Strengths-based interventions involve identifying strengths, understanding how they are used, and learning new ways to apply them. Individuals will identify their strengths, and then explore their roles in different areas of life (relationships, professional life, and personal fulfillment). Individuals will think about ways in which they currently use their strengths, along with new ways they could begin using them.    Therapeutic Goals Patient will verbalize two of their strengths Patient will identify how their strengths are currently used Patient will identify two new ways to apply their strengths  Patients will create a plan to apply their strengths in their daily lives     Summary of Patient Progress:  Did not attend      Therapeutic Modalities Cognitive Behavioral Therapy Motivational Interviewing  Aram Beecham, Theresia Majors 11/10/2021  1:58 PM

## 2021-11-10 NOTE — Progress Notes (Signed)
     11/10/21 2129  Psych Admission Type (Psych Patients Only)  Admission Status Voluntary  Psychosocial Assessment  Patient Complaints Anxiety;Depression  Eye Contact Fair  Facial Expression Flat  Affect Anxious  Speech Logical/coherent  Interaction Assertive  Motor Activity Slow  Appearance/Hygiene Disheveled  Behavior Characteristics Anxious  Mood Depressed;Anxious  Thought Process  Coherency WDL  Content WDL  Delusions None reported or observed  Perception Hallucinations  Hallucination Auditory;Tactile;Visual  Judgment Impaired  Confusion WDL  Danger to Self  Current suicidal ideation? Passive  Agreement Not to Harm Self Yes  Description of Agreement verbal agreement  Danger to Others  Danger to Others Reported or observed  Danger to Others Abnormal  Harmful Behavior to others No threats or harm toward other people  Destructive Behavior No threats or harm toward property

## 2021-11-10 NOTE — BH IP Treatment Plan (Signed)
Interdisciplinary Treatment and Diagnostic Plan Update  11/10/2021 Time of Session: 9:35am  Jon Clayton MRN: [id number]  Principal Diagnosis: MDD (major depressive disorder), recurrent, severe, with psychosis ([place])  Secondary Diagnoses: Principal Problem:   MDD (major depressive disorder), recurrent, severe, with psychosis ([place])   Current Medications:  Current Facility-Administered Medications  Medication Dose Route Frequency Provider Last Rate Last Admin   acetaminophen (TYLENOL) tablet 650 mg  650 mg Oral Q6H PRN Ival Bible, MD       alum & mag hydroxide-simeth (MAALOX/MYLANTA) 200-200-20 MG/5ML suspension 30 mL  30 mL Oral Q4H PRN Ival Bible, MD       busPIRone (BUSPAR) tablet 15 mg  15 mg Oral BID Ival Bible, MD   15 mg at 11/10/21 0931   hydrOXYzine (ATARAX) tablet 25 mg  25 mg Oral TID PRN Ival Bible, MD   25 mg at 11/10/21 0424   magnesium hydroxide (MILK OF MAGNESIA) suspension 30 mL  30 mL Oral Daily PRN Ival Bible, MD       neomycin-bacitracin-polymyxin [phone] OINT 1 application.  1 application. Topical Daily PRN Ival Bible, MD       nicotine (NICODERM CQ - dosed in mg/24 hours) patch 21 mg  21 mg Transdermal Daily Ival Bible, MD   21 mg at 11/10/21 0931   OLANZapine (ZYPREXA) tablet 5 mg  5 mg Oral BID Ival Bible, MD   5 mg at 11/10/21 0931   traZODone (DESYREL) tablet 50 mg  50 mg Oral QHS PRN Ival Bible, MD       traZODone (DESYREL) tablet 50 mg  50 mg Oral QHS PRN Ival Bible, MD       PTA Medications: Medications Prior to Admission  Medication Sig Dispense Refill Last Dose   busPIRone (BUSPAR) 15 MG tablet Take 1 tablet (15 mg total) by mouth 2 (two) times daily.      feeding supplement (ENSURE ENLIVE / ENSURE PLUS) LIQD Take 237 mLs by mouth 2 (two) times daily between meals. 237 mL 12    neomycin-bacitracin-polymyxin [phone] OINT Apply 1 application. topically  daily as needed (apply to excoriation).  0    nicotine (NICODERM CQ - DOSED IN MG/24 HOURS) 21 mg/24hr patch Place 1 patch (21 mg total) onto the skin daily. 28 patch 0    OLANZapine (ZYPREXA) 5 MG tablet Take 1 tablet (5 mg total) by mouth 2 (two) times daily.      sertraline (ZOLOFT) 50 MG tablet Take 1 tablet (50 mg total) by mouth daily.      traZODone (DESYREL) 50 MG tablet Take 1 tablet (50 mg total) by mouth at bedtime as needed for sleep.       Patient Stressors:    Patient Strengths:    Treatment Modalities: Medication Management, Group therapy, Case management,  1 to 1 session with clinician, Psychoeducation, Recreational therapy.   Physician Treatment Plan for Primary Diagnosis: MDD (major depressive disorder), recurrent, severe, with psychosis ([name]) Long Term Goal(s):     Short Term Goals:    Medication Management: Evaluate patient's response, side effects, and tolerance of medication regimen.  Therapeutic Interventions: 1 to 1 sessions, Unit Group sessions and Medication administration.  Evaluation of Outcomes: Not Met  Physician Treatment Plan for Secondary Diagnosis: Principal Problem:   MDD (major depressive disorder), recurrent, severe, with psychosis ([name])  Long Term Goal(s):     Short Term Goals:       Medication Management:  Evaluate patient's response, side effects, and tolerance of medication regimen.  Therapeutic Interventions: 1 to 1 sessions, Unit Group sessions and Medication administration.  Evaluation of Outcomes: Not Met   RN Treatment Plan for Primary Diagnosis: MDD (major depressive disorder), recurrent, severe, with psychosis ([name]) Long Term Goal(s): Knowledge of disease and therapeutic regimen to maintain health will improve  Short Term Goals: Ability to remain free from injury will improve, Ability to participate in decision making will improve, Ability to verbalize feelings will improve, Ability to disclose and discuss suicidal ideas, and  Ability to identify and develop effective coping behaviors will improve  Medication Management: RN will administer medications as ordered by provider, will assess and evaluate patient's response and provide education to patient for prescribed medication. RN will report any adverse and/or side effects to prescribing provider.  Therapeutic Interventions: 1 on 1 counseling sessions, Psychoeducation, Medication administration, Evaluate responses to treatment, Monitor vital signs and CBGs as ordered, Perform/monitor CIWA, COWS, AIMS and Fall Risk screenings as ordered, Perform wound care treatments as ordered.  Evaluation of Outcomes: Not Met   LCSW Treatment Plan for Primary Diagnosis: MDD (major depressive disorder), recurrent, severe, with psychosis ([name]) Long Term Goal(s): Safe transition to appropriate next level of care at discharge, Engage patient in therapeutic group addressing interpersonal concerns.  Short Term Goals: Engage patient in aftercare planning with referrals and resources, Increase social support, Increase emotional regulation, Facilitate acceptance of mental health diagnosis and concerns, Identify triggers associated with mental health/substance abuse issues, and Increase skills for wellness and recovery  Therapeutic Interventions: Assess for all discharge needs, 1 to 1 time with Social worker, Explore available resources and support systems, Assess for adequacy in community support network, Educate family and significant other(s) on suicide prevention, Complete Psychosocial Assessment, Interpersonal group therapy.  Evaluation of Outcomes: Not Met   Progress in Treatment: Attending groups: No. Participating in groups: No. Taking medication as prescribed: Yes. Toleration medication: Yes. Family/Significant other contact made: Yes, individual(s) contacted:  If consents are provided  Patient understands diagnosis: Yes. Discussing patient identified problems/goals with staff:  Yes. Medical problems stabilized or resolved: Yes. Denies suicidal/homicidal ideation: Yes. Issues/concerns per patient self-inventory: No.   New problem(s) identified: No, Describe:  None   New Short Term/Long Term Goal(s): medication stabilization, elimination of SI thoughts, development of comprehensive mental wellness plan.   Patient Goals: "To get my medications straight and to work on my impulse control"   Discharge Plan or Barriers: Patient recently admitted. CSW will continue to follow and assess for appropriate referrals and possible discharge planning.   Reason for Continuation of Hospitalization: Anxiety Depression Hallucinations Medication stabilization Suicidal ideation  Estimated Length of Stay: 3 to 7 days   Last 3 [place] Suicide Severity Risk Score: [place] Admission (Current) from 11/09/2021 in [place] [facility] ED from 11/04/2021 in [place] DEPT ED from 11/03/2021 in [place] DEPT  C-SSRS RISK CATEGORY High Risk High Risk High Risk       Last PHQ 2/9 Scores:    11/08/2021   11:57 AM 11/05/2021    3:57 AM 11/04/2021    6:30 AM  Depression screen PHQ 2/9  Decreased Interest _0 Down, Depressed, Hopeless _1 PHQ - 2 Score _2 Altered sleeping _3 Tired, decreased energy _4 Change in appetite _5 Feeling bad or failure about yourself  3 3 3  Trouble concentrating _0 Moving slowly or fidgety/restless _1 Suicidal thoughts _2 PHQ-9 Score _3 Difficult doing work/chores Very difficult Extremely dIfficult Extremely dIfficult    Scribe for Treatment Team: Darleen Crocker, Latanya Presser 11/10/2021 11:04 AM

## 2021-11-10 NOTE — Progress Notes (Signed)
NUTRITION ASSESSMENT  Pt identified as at risk on the Malnutrition Screen Tool  INTERVENTION: 1. Encourage PO intakes  NUTRITION DIAGNOSIS: Unintentional weight loss related to sub-optimal intake as evidenced by pt report.   Goal: Pt to meet >/= 90% of their estimated nutrition needs.  Monitor:  PO intake  Assessment:  Pt admitted for SI. H/o depression with psychosis and dissociative disorder.  Per weight records, pt has lost 6 lbs since 5/25.  Supplements not warranted at this time.   Height: Ht Readings from Last 1 Encounters:  11/09/21 5\' 7"  (1.702 m)    Weight: Wt Readings from Last 1 Encounters:  11/09/21 71.9 kg    Weight Hx: Wt Readings from Last 10 Encounters:  11/09/21 71.9 kg  11/04/21 74.8 kg  11/03/21 74.8 kg    BMI:  Body mass index is 24.84 kg/m. Pt meets criteria for normal based on current BMI.  Estimated Nutritional Needs: Kcal: 25-30 kcal/kg Protein: > 1 gram protein/kg Fluid: 1 ml/kcal  Diet Order:  Diet Order             Diet regular Room service appropriate? Yes; Fluid consistency: Thin  Diet effective now                  Pt is also offered choice of unit snacks mid-morning and mid-afternoon.  Pt is eating as desired.   Lab results and medications reviewed.   11/05/21, MS, RD, LDN Inpatient Clinical Dietitian Contact information available via Amion

## 2021-11-11 ENCOUNTER — Encounter (HOSPITAL_COMMUNITY): Payer: Self-pay | Admitting: Psychiatry
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
## 2021-11-11 NOTE — Plan of Care (Signed)
Nurse discussed coping skills with patient.  

## 2021-11-11 NOTE — Progress Notes (Signed)
Pt did not attend group. 

## 2021-11-11 NOTE — [facility] Counselor (Signed)
CSW provided the Pt with a packet that contains information including shelter and housing resources, free and reduced price food information, clothing resources, crisis center information, a [employer] card, and suicide prevention information.   

## 2021-11-12 MED ORDER — QUETIAPINE FUMARATE 50 MG PO TABS
50.0000 mg | ORAL_TABLET | Freq: Once | ORAL | Status: AC
  Administered 2021-11-12: 50 mg via ORAL

## 2021-11-12 MED ORDER — QUETIAPINE FUMARATE 50 MG PO TABS
ORAL_TABLET | ORAL | Status: AC
  Filled 2021-11-12: qty 1

## 2021-11-12 MED ORDER — QUETIAPINE FUMARATE 50 MG PO TABS
50.0000 mg | ORAL_TABLET | Freq: Two times a day (BID) | ORAL | Status: DC
  Administered 2021-11-12 – 2021-11-13 (×2): 50 mg via ORAL
  Filled 2021-11-12 (×4): qty 1

## 2021-11-12 MED ORDER — QUETIAPINE FUMARATE 200 MG PO TABS
200.0000 mg | ORAL_TABLET | Freq: Every day | ORAL | Status: DC
  Administered 2021-11-12: 200 mg via ORAL
  Filled 2021-11-12 (×3): qty 1

## 2021-11-12 NOTE — Progress Notes (Signed)
Pt was irritable and upset this morning,  pacing in his room.  Pt contracts for safety on the unit although he said he has thoughts of either hurting himself or someone else. Pt is vague and unable to describe any specific plan on how he would harm himself/someone else.  RN provided empathic responses and spoke to pt in soft tone.   Pt was given Seroquel per MD order and pt calmed down dramatically.  Pt has been resting for a large part of the day and appears to be in no acute distress at this time.  RN will continue to monitor pt's progress and provide assistance as needed.

## 2021-11-12 NOTE — Progress Notes (Signed)
   11/11/21 2100  Psych Admission Type (Psych Patients Only)  Admission Status Voluntary  Psychosocial Assessment  Patient Complaints Isolation;Depression  Eye Contact Fair  Facial Expression Flat;Sad  Affect Anxious;Depressed  Speech Logical/coherent  Interaction Assertive  Motor Activity Slow  Appearance/Hygiene Unremarkable  Behavior Characteristics Appropriate to situation  Mood Depressed  Thought Process  Coherency WDL  Content WDL  Delusions None reported or observed  Perception WDL  Hallucination None reported or observed  Judgment Impaired  Confusion None  Danger to Self  Current suicidal ideation? Denies  Agreement Not to Harm Self Yes  Danger to Others  Danger to Others None reported or observed  Danger to Others Abnormal  Harmful Behavior to others No threats or harm toward other people  Destructive Behavior No threats or harm toward property
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
## 2021-11-12 NOTE — Progress Notes (Signed)
Pt did not attend wrap-up group   

## 2021-11-12 NOTE — Progress Notes (Signed)
   11/12/21 2000  Psych Admission Type (Psych Patients Only)  Admission Status Voluntary  Psychosocial Assessment  Patient Complaints Anxiety;Depression;Hopelessness;Insomnia;Self-harm thoughts  Eye Contact Glaring  Facial Expression Angry  Affect Anxious;Depressed  Speech Logical/coherent  Interaction Cautious  Motor Activity Pacing  Appearance/Hygiene Unremarkable  Behavior Characteristics Anxious  Mood Depressed  Thought Process  Coherency WDL;Tangential  Content Blaming others  Delusions None reported or observed  Perception Hallucinations  Hallucination Auditory;Visual  Judgment WDL  Confusion WDL  Danger to Self  Current suicidal ideation? Verbalizes;Passive (Denies)  Description of Suicide Plan no plan  Self-Injurious Behavior No self-injurious ideation or behavior indicators observed or expressed   Agreement Not to Harm Self Yes  Description of Agreement verbal  Danger to Others  Danger to Others None reported or observed  Danger to Others Abnormal  Harmful Behavior to others No threats or harm toward other people  Destructive Behavior No threats or harm toward property   Patient continue to endorse Passive SI, denies Plan and verbally contracted for safety. Q 15 minutes safety checks ongoing without self harm gestures. Support and encouragement provided.

## 2021-11-12 NOTE — Group Note (Signed)
Recreation Therapy Group Note   Group Topic:Stress Management  Group Date: 11/12/2021 Start Time: 0935 End Time: 0955 Facilitators: Victorino Sparrow, [place] Location: [address] Dayroom   Goal Area(s) Addresses:  Patient will identify positive stress management techniques. Patient will identify benefits of using stress management post d/c.  Group Description:  Meditation.  LRT played a meditation for patients that focused on finding inner peace for different areas of their lives.  Patients were to listen and focus on the meditation to fully engage in the activity.  Patients were made aware of other places to find stress management techniques such as Youtube, Apps, etc.    Affect/Mood: N/A   Participation Level: Did not attend    Clinical Observations/Individualized Feedback:      Plan: Continue to engage patient in RT group sessions 2-3x/week.   Victorino Sparrow, LRT,CTRS 11/12/2021 11:16 AM

## 2021-11-12 NOTE — Group Note (Signed)
[facility] LCSW Group Therapy Note   Group Date: 11/12/2021 Start Time: 1300 End Time: 1400  Type of Therapy and Topic:  Group Therapy:  Feelings around Relapse and Recovery  Participation Level:  Did Not Attend   Mood:  Description of Group:    Patients in this group will discuss emotions they experience before and after a relapse. They will process how experiencing these feelings, or avoidance of experiencing them, relates to having a relapse. Facilitator will guide patients to explore emotions they have related to recovery. Patients will be encouraged to process which emotions are more powerful. They will be guided to discuss the emotional reaction significant others in their lives may have to patients' relapse or recovery. Patients will be assisted in exploring ways to respond to the emotions of others without this contributing to a relapse.  Therapeutic Goals: Patient will identify two or more emotions that lead to relapse for them:  Patient will identify two emotions that result when they relapse:  Patient will identify two emotions related to recovery:  Patient will demonstrate ability to communicate their needs through discussion and/or role plays.   Summary of Patient Progress:  Patient did not attend group despite encouraged participation.    Therapeutic Modalities:   Cognitive Behavioral Therapy Solution-Focused Therapy Assertiveness Training Relapse Prevention Therapy   Nelvin Tomb W Hamilton Marinello, LCSWA 

## 2021-11-13 MED ORDER — QUETIAPINE FUMARATE 400 MG PO TABS
400.0000 mg | ORAL_TABLET | Freq: Every day | ORAL | Status: DC
Start: 2021-11-13 — End: 2021-11-18
  Administered 2021-11-13 – 2021-11-17 (×5): 400 mg via ORAL
  Filled 2021-11-13 (×6): qty 1

## 2021-11-13 MED ORDER — QUETIAPINE FUMARATE 100 MG PO TABS
100.0000 mg | ORAL_TABLET | Freq: Two times a day (BID) | ORAL | Status: DC
  Administered 2021-11-13 – 2021-11-14 (×2): 100 mg via ORAL
  Filled 2021-11-13 (×4): qty 1

## 2021-11-13 MED ORDER — QUETIAPINE FUMARATE 50 MG PO TABS
50.0000 mg | ORAL_TABLET | Freq: Once | ORAL | Status: AC
  Administered 2021-11-13: 50 mg via ORAL
  Filled 2021-11-13: qty 1

## 2021-11-13 NOTE — Group Note (Signed)
[facility] Group Notes: (Clinical Social Work)   11/13/2021      Type of Therapy:  Group Therapy   Participation Level:  Did Not Attend - was invited both individually by [facility] and by overhead announcement, chose not to attend.   Ambrose Mantle, LCSW 11/13/2021, 4:05 PM

## 2021-11-13 NOTE — Progress Notes (Addendum)
Adult Psychoeducational Group Note  Date:  11/13/2021 Time:  8:29 PM  Group Topic/Focus:  Wrap-Up Group:   The focus of this group is to help patients review their daily goal of treatment and discuss progress on daily workbooks.  Participation Level:  Active  Participation Quality:  Appropriate, Attentive, and Sharing  Affect:  Depressed and Flat  Cognitive:  Alert and Appropriate  Insight: Appropriate  Engagement in Group:  Engaged  Modes of Intervention:  Discussion and Support  Additional Comments:  pt shared he had a good day. Pt wants to work on impulse control. Pt shared "I almost acted on impulse thought today". Pt shared "It was good timing because doctor came in". Pt shared he feel safe for now but he never knows when he feels like this.   Jon Clayton 11/13/2021, 8:29 PMChild/Adolescent Psychoeducational Group Note  Date:  11/13/2021 Time:  8:23 PM   Jon Clayton 11/13/2021, 8:23 PM

## 2021-11-13 NOTE — Progress Notes (Signed)
   11/13/21 2110  Psych Admission Type (Psych Patients Only)  Admission Status Voluntary  Psychosocial Assessment  Patient Complaints None  Eye Contact Brief  Facial Expression Flat  Affect Flat;Blunted  Speech Logical/coherent  Interaction Guarded  Motor Activity Other (Comment)  Appearance/Hygiene Improved  Behavior Characteristics Cooperative  Mood Depressed  Thought Process  Coherency Tangential  Content Preoccupation  Delusions WDL  Perception Hallucinations  Hallucination Auditory;Visual  Judgment Impaired  Confusion None  Danger to Self  Current suicidal ideation? Denies
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
## 2021-11-13 NOTE — Progress Notes (Addendum)
CC: 26 y.o. male with a history of depression, polysubstance use and PTSD, who was initially admitted for inpatient psychiatric hospitalization on 11/09/2021 for management of worsening depression and SI. He stated he had tried to hurt himself before and he wanted to hurt himself now.  Patient has no medical problems.  Pt is voluntary.   Pt is A&OX4, calm, flat, denies suicidal ideations, denies homicidal ideations. Pt admits to auditory and visual hallucinations. Pt states he sees "[name]." "In the middle of the night, [name] tells me to hurt myself or someone else. [name] also tells me to pull pranks. I get the impulse to do it." Pt verbally agrees to approach staff if these become apparent and before harming self or others. Pt denies experiencing nightmares. Mood and affect are congruent. Pt appetite is ok. No complaints of anxiety, distress, pain and/or discomfort at this time. Pt's memory appears to be grossly intact, and Pt hasn't displayed any injurious behaviors. Pt is medication compliant. There's no evidence of suicidal intent. Psychomotor activity was WNL. No s/s of Parkinson, Dystonia, Akathisia and/or Tardive Dyskinesia noted.

## 2021-11-14 MED ORDER — QUETIAPINE FUMARATE 50 MG PO TABS
150.0000 mg | ORAL_TABLET | Freq: Two times a day (BID) | ORAL | Status: DC
  Administered 2021-11-14 – 2021-11-15 (×2): 150 mg via ORAL
  Filled 2021-11-14 (×4): qty 3

## 2021-11-14 MED ORDER — QUETIAPINE FUMARATE 50 MG PO TABS
50.0000 mg | ORAL_TABLET | Freq: Once | ORAL | Status: AC
  Administered 2021-11-14: 50 mg via ORAL
  Filled 2021-11-14: qty 1

## 2021-11-14 NOTE — Group Note (Signed)
[facility] Group Notes: (Clinical Social Work)   11/14/2021      Type of Therapy:  Group Therapy   Participation Level:  Did Not Attend - was invited both individually by [facility] and by overhead announcement, chose not to attend.   Lomax Poehler Grossman-Orr, LCSW 11/14/2021, 2:45 PM    

## 2021-11-14 NOTE — Progress Notes (Signed)
Bed sheet given back to patient today, per MD Dr. Celestia Khat request. Patient has been safe this shift, denies any intent on self harm, continues to deny SI. Patient verbalized to Clinical research associate he feels his Seroquel is working to help "decrease the voices and help me drown them out."
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
## 2021-11-14 NOTE — Plan of Care (Signed)
Patient alert and oriented x4. He does appear to be responding to internal stimuli. Continues to have some bizarre behaviors, noted to be standing in his room in the dark, hovering near the door. Patient denies any current SI or HI but does verbalize A/V hallucinations, and states sometimes he feels someone touch his arm. Denies any current pain at this time. Will continue to provide verbal encouragement and q37min checks.     Problem: Education: Goal: Knowledge of General Education information will improve Description: Including pain rating scale, medication(s)/side effects and non-pharmacologic comfort measures Outcome: Progressing   Problem: Health Behavior/Discharge Planning: Goal: Ability to manage health-related needs will improve Outcome: Progressing   Problem: Clinical Measurements: Goal: Ability to maintain clinical measurements within normal limits will improve Outcome: Progressing Goal: Will remain free from infection Outcome: Progressing Goal: Diagnostic test results will improve Outcome: Progressing Goal: Respiratory complications will improve Outcome: Progressing Goal: Cardiovascular complication will be avoided Outcome: Progressing   Problem: Activity: Goal: Risk for activity intolerance will decrease Outcome: Progressing   Problem: Nutrition: Goal: Adequate nutrition will be maintained Outcome: Progressing   Problem: Coping: Goal: Level of anxiety will decrease Outcome: Progressing   Problem: Elimination: Goal: Will not experience complications related to bowel motility Outcome: Progressing Goal: Will not experience complications related to urinary retention Outcome: Progressing   Problem: Pain Managment: Goal: General experience of comfort will improve Outcome: Progressing   Problem: Safety: Goal: Ability to remain free from injury will improve Outcome: Progressing   Problem: Skin Integrity: Goal: Risk for impaired skin integrity will  decrease Outcome: Progressing   Problem: Education: Goal: Utilization of techniques to improve thought processes will improve Outcome: Progressing Goal: Knowledge of the prescribed therapeutic regimen will improve Outcome: Progressing   Problem: Activity: Goal: Interest or engagement in leisure activities will improve Outcome: Progressing Goal: Imbalance in normal sleep/wake cycle will improve Outcome: Progressing   Problem: Coping: Goal: Coping ability will improve Outcome: Progressing Goal: Will verbalize feelings Outcome: Progressing   Problem: Health Behavior/Discharge Planning: Goal: Ability to make decisions will improve Outcome: Progressing Goal: Compliance with therapeutic regimen will improve Outcome: Progressing   Problem: Role Relationship: Goal: Will demonstrate positive changes in social behaviors and relationships Outcome: Progressing   Problem: Safety: Goal: Ability to disclose and discuss suicidal ideas will improve Outcome: Progressing Goal: Ability to identify and utilize support systems that promote safety will improve Outcome: Progressing   Problem: Self-Concept: Goal: Will verbalize positive feelings about self Outcome: Progressing Goal: Level of anxiety will decrease Outcome: Progressing   Problem: Education: Goal: Ability to make informed decisions regarding treatment will improve Outcome: Progressing   Problem: Coping: Goal: Coping ability will improve Outcome: Progressing   Problem: Health Behavior/Discharge Planning: Goal: Identification of resources available to assist in meeting health care needs will improve Outcome: Progressing   Problem: Medication: Goal: Compliance with prescribed medication regimen will improve Outcome: Progressing   Problem: Self-Concept: Goal: Ability to disclose and discuss suicidal ideas will improve Outcome: Progressing Goal: Will verbalize positive feelings about self Outcome: Progressing    Problem: Education: Goal: Ability to state activities that reduce stress will improve Outcome: Progressing   Problem: Coping: Goal: Ability to identify and develop effective coping behavior will improve Outcome: Progressing   Problem: Self-Concept: Goal: Ability to identify factors that promote anxiety will improve Outcome: Progressing Goal: Level of anxiety will decrease Outcome: Progressing Goal: Ability to modify response to factors that promote anxiety will improve Outcome: Progressing

## 2021-11-14 NOTE — Progress Notes (Signed)
   11/14/21 2014  Psych Admission Type (Psych Patients Only)  Admission Status Voluntary  Psychosocial Assessment  Patient Complaints None  Eye Contact Brief  Facial Expression Flat  Affect Flat;Blunted  Speech Logical/coherent  Interaction Guarded  Motor Activity Other (Comment)  Appearance/Hygiene Improved  Behavior Characteristics Cooperative;Appropriate to situation  Mood Pleasant  Thought Process  Coherency WDL  Content Preoccupation  Delusions WDL  Perception Hallucinations  Hallucination Auditory;Visual  Judgment Impaired  Confusion None  Danger to Self  Current suicidal ideation? Denies  Danger to Others  Danger to Others None reported or observed

## 2021-11-14 NOTE — Progress Notes (Signed)
Adult Psychoeducational Group Note  Date:  11/14/2021 Time:  7:45 PM  Group Topic/Focus:  Wrap-Up Group:   The focus of this group is to help patients review their daily goal of treatment and discuss progress on daily workbooks.  Participation Level:  Active  Participation Quality:  Appropriate, Attentive, and Sharing  Affect:  Depressed and Flat  Cognitive:  Alert and Appropriate  Insight: Appropriate  Engagement in Group:  Engaged  Modes of Intervention:  Discussion and Support  Additional Comments:  Today pt shared his day has been overall good. Pt shared his goal is to work on impulse control. Pt shared he accomplished his goal and was able to get his things back. Pt stated "I colored and I like doing that".  Terrial Rhodes 11/14/2021, 7:45 PM

## 2021-11-15 ENCOUNTER — Encounter (HOSPITAL_COMMUNITY): Payer: Self-pay

## 2021-11-15 MED ORDER — QUETIAPINE FUMARATE 200 MG PO TABS
200.0000 mg | ORAL_TABLET | Freq: Two times a day (BID) | ORAL | Status: DC
  Administered 2021-11-15 – 2021-11-18 (×6): 200 mg via ORAL
  Filled 2021-11-15 (×8): qty 1

## 2021-11-15 NOTE — Progress Notes (Signed)
D- Patient alert and oriented x4. Patient is anxious and paranoid. Pt stating he is having command hallucinations to alter things in the room because "jack", his hallucinations, are telling him to. Pt paranoid about objects in room and wants to remove it. Pt stated " My blood feels hot." Pt given PRN by prior shift and it is not effective. Pt using wet toilet paper and placing it on sensors in the room's ceiling. Pt's states jack is telling him to hurt himself  but verbal contracts for safety. He also states " I do not want to hurt myself."
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
## 2021-11-15 NOTE — Progress Notes (Signed)
Patient came to nurse's station with screw he had removed from his door frame.  Patient saw that the screw has been lose for a couple of days.  "[name]" would not let me forget about it, kept harping on the screw telling me to take it out of the door frame.  So that is what I did.  I took the screw out.  I have a hard time talking things out.  [name] was trying to get me to swallow the screw.  And I didn't.  [name] was telling me to take the door frame off.  That's it.  I walked to the nurse's station and told the worker sitting at the desk.  I don't know what to call it but my blood feels hot.  I asked if someone could keep an eye on me at least until I am asleep.    AC was informed of this incident by another nurse.

## 2021-11-15 NOTE — Progress Notes (Signed)
D-Pt agitated and argumentative in hallway. After patient initially agreed to be placed in 500 unit due to increase paranoia and hallucinations, he stated he did  not want to be there anymore. Pt stated he felt like he got in trouble, and patient educated he was not. Pt educated that the 500 unit had decrease stimuli and would help better with managing his symptoms.    A- Press photographer and house supervisors spoke with patient. Pt assured he is in a safe place. Scheduled medications administered to patient, per MD orders. Routine safety checks conducted every 15 minutes.  Patient informed to notify staff with problems or concerns.   R- Patient compliant with medications but remained agitated and went to his room.

## 2021-11-15 NOTE — Progress Notes (Signed)
D:  Patient's self inventory sheet, patient has fair sleep, sleep medication helpful.  Good appetite, low energy level, good concentration.  Rated depression and hopeless 5, anxiety 7.  Denied withdrawals.  SI, sometimes, denied SI this morning.   Physical problems, lightheaded, pain, dizzy, .  Rates pain 6-7 in back/private area.  Goal is impulse control and communication of impulse.  Be open with staff and be alone?   If I have someone in my room, my impulses would decrease.   Lack of self trust/control. A:  Medications administered per MD orders.  Emotional support and encouragement given patient. R:  SI in late afternoon, contracts for safety.  Jon Clayton continues to talk to patient.  Safety maintained with 15 minute checks. Patient given paper to write his thoughts.  Pictures to color.  Wants to keep his mind busy.

## 2021-11-15 NOTE — Group Note (Signed)
LCSW Group Therapy Note   Group Date: 11/15/2021 Start Time: 1300 End Time: 1400  Type of Therapy and Topic:  Group Therapy:  Feelings and Emotions in the Body   Participation Level:  Did Not Attend   Description of Group This process group involved patients discussing their feelings and emotions and where they feel those emotions within their bodies.  These feelings and emotions were named and described by using an emotion wheel.  The group then brainstormed specific ways in which they could begin to notice their emotions and feelings more often before participating in an unwanted behavior.   Therapeutic Goals Patient will identify and describe positive and negative feelings.  Patient will identify where these feelings and emotions take place within their body. Patients will brainstorm together ways they can begin to notice their emotional body responses in the outpatient setting/environment. Patients will identify barriers to wellness and possible solutions to help achieve better emotion regulation.   Summary of Patient Progress:  Did not attend   Therapeutic Modalities Cognitive Behavioral Therapy Motivational Interviewing  Lizza Huffaker M Lauretta Sallas, LCSWA 11/15/2021  1:28 PM    

## 2021-11-15 NOTE — Plan of Care (Signed)
Nurse discussed coping skills with patient.  

## 2021-11-15 NOTE — BH IP Treatment Plan (Signed)
Interdisciplinary Treatment and Diagnostic Plan Update  11/15/2021 Time of Session: 0830 Jon Clayton MRN: [id number]  Principal Diagnosis: MDD (major depressive disorder), recurrent, severe, with psychosis ([place])  Secondary Diagnoses: Principal Problem:   MDD (major depressive disorder), recurrent, severe, with psychosis ([place]) Active Problems:   Polysubstance use disorder   Current Medications:  Current Facility-Administered Medications  Medication Dose Route Frequency Provider Last Rate Last Admin   acetaminophen (TYLENOL) tablet 650 mg  650 mg Oral Q6H PRN Ival Bible, MD   650 mg at 11/15/21 0232   alum & mag hydroxide-simeth (MAALOX/MYLANTA) 200-200-20 MG/5ML suspension 30 mL  30 mL Oral Q4H PRN Ival Bible, MD       busPIRone (BUSPAR) tablet 15 mg  15 mg Oral BID Ival Bible, MD   15 mg at 11/15/21 0740   DULoxetine (CYMBALTA) DR capsule 60 mg  60 mg Oral Daily Attiah, Nadir, MD   60 mg at 11/15/21 0740   hydrOXYzine (ATARAX) tablet 25 mg  25 mg Oral TID PRN Ival Bible, MD   25 mg at 11/14/21 2105   magnesium hydroxide (MILK OF MAGNESIA) suspension 30 mL  30 mL Oral Daily PRN Ival Bible, MD       neomycin-bacitracin-polymyxin [id number] OINT 1 application.  1 application. Topical Daily PRN Ival Bible, MD       nicotine (NICODERM CQ - dosed in mg/24 hours) patch 21 mg  21 mg Transdermal Daily Ival Bible, MD   21 mg at 11/15/21 0741   QUEtiapine (SEROQUEL) tablet 200 mg  200 mg Oral BID Winfred Leeds, Nadir, MD       QUEtiapine (SEROQUEL) tablet 400 mg  400 mg Oral QHS Attiah, Nadir, MD   400 mg at 11/14/21 2103   traZODone (DESYREL) tablet 50 mg  50 mg Oral QHS PRN Ival Bible, MD   50 mg at 11/14/21 2104   PTA Medications: Medications Prior to Admission  Medication Sig Dispense Refill Last Dose   busPIRone (BUSPAR) 15 MG tablet Take 1 tablet (15 mg total) by mouth 2 (two) times daily.      feeding supplement  (ENSURE ENLIVE / ENSURE PLUS) LIQD Take 237 mLs by mouth 2 (two) times daily between meals. 237 mL 12    neomycin-bacitracin-polymyxin [phone] OINT Apply 1 application. topically daily as needed (apply to excoriation).  0    nicotine (NICODERM CQ - DOSED IN MG/24 HOURS) 21 mg/24hr patch Place 1 patch (21 mg total) onto the skin daily. 28 patch 0    OLANZapine (ZYPREXA) 5 MG tablet Take 1 tablet (5 mg total) by mouth 2 (two) times daily.      sertraline (ZOLOFT) 50 MG tablet Take 1 tablet (50 mg total) by mouth daily.      traZODone (DESYREL) 50 MG tablet Take 1 tablet (50 mg total) by mouth at bedtime as needed for sleep.       Patient Stressors:    Patient Strengths:    Treatment Modalities: Medication Management, Group therapy, Case management,  1 to 1 session with clinician, Psychoeducation, Recreational therapy.   Physician Treatment Plan for Primary Diagnosis: MDD (major depressive disorder), recurrent, severe, with psychosis ([place]) Long Term Goal(s):     Short Term Goals:    Medication Management: Evaluate patient's response, side effects, and tolerance of medication regimen.  Therapeutic Interventions: 1 to 1 sessions, Unit Group sessions and Medication administration.  Evaluation of Outcomes: Progressing  Physician Treatment Plan for Secondary Diagnosis: Principal  Problem:   MDD (major depressive disorder), recurrent, severe, with psychosis ([place]) Active Problems:   Polysubstance use disorder  Long Term Goal(s):     Short Term Goals:       Medication Management: Evaluate patient's response, side effects, and tolerance of medication regimen.  Therapeutic Interventions: 1 to 1 sessions, Unit Group sessions and Medication administration.  Evaluation of Outcomes: Progressing   RN Treatment Plan for Primary Diagnosis: MDD (major depressive disorder), recurrent, severe, with psychosis ([place]) Long Term Goal(s): Knowledge of disease and therapeutic regimen to maintain  health will improve  Short Term Goals: Ability to remain free from injury will improve, Ability to verbalize frustration and anger appropriately will improve, Ability to demonstrate self-control, Ability to participate in decision making will improve, Ability to verbalize feelings will improve, Ability to disclose and discuss suicidal ideas, Ability to identify and develop effective coping behaviors will improve, and Compliance with prescribed medications will improve  Medication Management: RN will administer medications as ordered by provider, will assess and evaluate patient's response and provide education to patient for prescribed medication. RN will report any adverse and/or side effects to prescribing provider.  Therapeutic Interventions: 1 on 1 counseling sessions, Psychoeducation, Medication administration, Evaluate responses to treatment, Monitor vital signs and CBGs as ordered, Perform/monitor CIWA, COWS, AIMS and Fall Risk screenings as ordered, Perform wound care treatments as ordered.  Evaluation of Outcomes: Progressing   LCSW Treatment Plan for Primary Diagnosis: MDD (major depressive disorder), recurrent, severe, with psychosis ([place]) Long Term Goal(s): Safe transition to appropriate next level of care at discharge, Engage patient in therapeutic group addressing interpersonal concerns.  Short Term Goals: Engage patient in aftercare planning with referrals and resources, Increase social support, Increase ability to appropriately verbalize feelings, Increase emotional regulation, Facilitate acceptance of mental health diagnosis and concerns, Facilitate patient progression through stages of change regarding substance use diagnoses and concerns, Identify triggers associated with mental health/substance abuse issues, and Increase skills for wellness and recovery  Therapeutic Interventions: Assess for all discharge needs, 1 to 1 time with Social worker, Explore available resources and  support systems, Assess for adequacy in community support network, Educate family and significant other(s) on suicide prevention, Complete Psychosocial Assessment, Interpersonal group therapy.  Evaluation of Outcomes: Progressing   Progress in Treatment: Attending groups: No. Participating in groups: No. Taking medication as prescribed: Yes. Toleration medication: Yes. Family/Significant other contact made: No, will contact:  CSW will make additional attempts to reach [place], friend.   Patient understands diagnosis: Yes. Discussing patient identified problems/goals with staff: Yes. Medical problems stabilized or resolved: Yes. Denies suicidal/homicidal ideation: No. Issues/concerns per patient self-inventory: Yes. Other: none   New problem(s) identified: No, Describe:  none   New Short Term/Long Term Goal(s): Patient to work towards detox, elimination of symptoms of psychosis, medication management for mood stabilization; elimination of SI thoughts; development of comprehensive mental wellness/sobriety plan.  Patient Goals: No additional goals identified at this time. Patient to continue to work towards original goals identified in initial treatment team meeting. CSW will remain available to patient should they voice additional treatment goals.   Discharge Plan or Barriers: No psychosocial barriers identified at this time, patient to return to place of residence at [facility] when appropriate for discharge.   Reason for Continuation of Hospitalization: Depression Hallucinations  Estimated Length of Stay: 1-7 days   Last 3 [place] Suicide Severity Risk Score: Flowsheet Row Admission (Current) from 11/09/2021 in [facility] ED from 11/04/2021  in [facility] ED from 11/03/2021 in [facility]  C-SSRS RISK CATEGORY High Risk High Risk High Risk       Last PHQ 2/9 Scores:     11/08/2021   11:57 AM 11/05/2021    3:57 AM 11/04/2021    6:30 AM  Depression screen PHQ 2/9  Decreased Interest 2 3 3   Down, Depressed, Hopeless 2 3 3   PHQ - 2 Score 4 6 6   Altered sleeping 3 3 3   Tired, decreased energy 3 3 3   Change in appetite 3 2 3   Feeling bad or failure about yourself  3 3 3   Trouble concentrating 3 2 2   Moving slowly or fidgety/restless 3 2 2   Suicidal thoughts 3 3 3   PHQ-9 Score 25 24 25   Difficult doing work/chores Very difficult Extremely dIfficult Extremely dIfficult    Scribe for Treatment Team: Larose Kells 11/15/2021 11:22 AM

## 2021-11-15 NOTE — [facility] Suicide Risk Assessment (Signed)
[facility] INPATIENT:  Family/Significant Other Suicide Prevention Education  Suicide Prevention Education:  Contact Attempts: [employer], [phone] has been identified by the patient as the family member/significant other with whom the patient will be residing, and identified as the person(s) who will aid the patient in the event of a mental health crisis.  With written consent from the patient, two attempts were made to provide suicide prevention education, prior to and/or following the patient's discharge.  We were unsuccessful in providing suicide prevention education.  A suicide education pamphlet was given to the patient to share with family/significant other.  Date and time of first attempt: 11/11/2021 Date and time of second attempt: 11/15/2021  CSW will make final attempt to reach friend of patient to complete SPE.   Corky Crafts 11/15/2021, 3:23 PM

## 2021-11-16 MED ORDER — OLANZAPINE 10 MG IM SOLR: 10.0000 mg | Freq: Two times a day (BID) | INTRAMUSCULAR | Status: DC | PRN

## 2021-11-16 MED ORDER — CLONIDINE HCL 0.1 MG PO TABS
0.1000 mg | ORAL_TABLET | Freq: Two times a day (BID) | ORAL | Status: DC
Start: 2021-11-16 — End: 2021-11-18
  Administered 2021-11-16 – 2021-11-18 (×4): 0.1 mg via ORAL
  Filled 2021-11-16 (×7): qty 1

## 2021-11-16 MED ORDER — OLANZAPINE 10 MG PO TABS: 10.0000 mg | ORAL_TABLET | Freq: Two times a day (BID) | ORAL | Status: DC | PRN

## 2021-11-16 NOTE — Group Note (Signed)
Recreation Therapy Group Note   Group Topic:Other  Group Date: 11/16/2021 Start Time: 1000 End Time: 1045 Facilitators: Caroll Rancher, LRT,CTRS Location: [address] Dayroom   Goal Area(s) Addresses:  Patient will identify and write positive traits and accomplishments about themself. Patient will acknowledge the benefit of healthy self-expression. Patient will endorse understanding of self-expression.   Group Description:  LRT began group session with open dialogue asking the patients to define self-expression and verbally identify what the purpose of self-expression is. Patients were then instructed to design a personalized license plate, with words and drawings, representing positive things about themselves. Patients were encouraged to include favorites, things they are proud of, what they enjoy doing, and dreams for their future. If a patient had a life motto or a meaningful phase that expressed their life values, patients were asked to incorporate that into their design as well. Patients were given the opportunity to share their completed work with the group.   Affect/Mood: N/A   Participation Level: Did not attend    Clinical Observations/Individualized Feedback:    Plan: Continue to engage patient in RT group sessions 2-3x/week.   Caroll Rancher, LRT,CTRS 11/16/2021 12:28 PM

## 2021-11-16 NOTE — Progress Notes (Signed)
Pt stated he would like Long Term T[facility] , pt encouraged to talk to SW about various programs to fit pt specific situation. Writer talked to pt about past situations and experiences and moving forward, pt appeared to listen    11/16/21 2000  Psych Admission Type (Psych Patients Only)  Admission Status Voluntary  Psychosocial Assessment  Patient Complaints Anxiety;Worrying  Eye Contact Fair  Facial Expression Anxious  Affect Anxious;Preoccupied  Speech Logical/coherent  Interaction Assertive  Motor Activity Slow  Appearance/Hygiene Unremarkable  Behavior Characteristics Anxious;Appropriate to situation  Mood Anxious  Thought Process  Coherency WDL  Content Preoccupation  Delusions None reported or observed  Perception Hallucinations  Hallucination Auditory  Judgment Impaired  Confusion WDL  Danger to Self  Current suicidal ideation? Passive  Self-Injurious Behavior Some self-injurious ideation observed or expressed.  No lethal plan expressed   Agreement Not to Harm Self Yes  Description of Agreement Verbal contract for safety  Danger to Others  Danger to Others None reported or observed  Danger to Others Abnormal  Harmful Behavior to others No threats or harm toward other people

## 2021-11-16 NOTE — Progress Notes (Signed)
   11/15/21 2100  Psych Admission Type (Psych Patients Only)  Admission Status Voluntary  Psychosocial Assessment  Patient Complaints Agitation;Anxiety;Irritability;Self-harm thoughts  Eye Contact Fair  Facial Expression Anxious  Affect Anxious;Irritable  Speech Argumentative  Interaction Childlike;Attention-seeking;Demanding  Motor Activity Other (Comment) (WDL)  Appearance/Hygiene Disheveled  Behavior Characteristics Agitated;Anxious;Irritable  Mood Anxious;Irritable  Thought Process  Coherency WDL  Content Preoccupation  Delusions None reported or observed  Perception Hallucinations  Hallucination Auditory;Visual;Command  Judgment Impaired  Confusion None  Danger to Self  Current suicidal ideation? Passive  Agreement Not to Harm Self Yes  Description of Agreement Verbal Contract  Danger to Others  Danger to Others None reported or observed  Danger to Others Abnormal  Harmful Behavior to others No threats or harm toward other people  Destructive Behavior Acts of violence toward property observed   Description of Destructive Behavior Taking screws out doors, tiolet paper to cover sensors in room

## 2021-11-16 NOTE — Progress Notes (Signed)
Pt came to [facility] and apologized for his behavior last night and stated he finally understood what writer was explaining to pt about being over here if he was experiencing AVH , with less stimulation he would do better over here than on the other side. Pt has been pleasant on the unit and appropriate

## 2021-11-16 NOTE — Progress Notes (Addendum)
Pt A & O X4. Presents with blunted affect, irritability and pressured speech on initial interactions. Denies HI and AVH when assessed. Endorsed passive SI "Being back here makes me suicidal. I'm not suppose to be back here and they moved me just when I was falling asleep because they didn't have no one to sit with me. I do have thoughts on and off but there's nothing here to hurt myself right now. I will definitely let you know". Reports poor sleep from last night related to racing thoughts and SI "I couldn't stop thinking of ways to hurt myself last night, so no I did not sleep well". States appetite is fair. Pt took his medications as ordered without issues, denies adverse drug reactions.  Q 15 minutes safety checks maintained without outburst or self harm gestures. Emotional support, encouragement and reassurance provided to pt this shift. All medications administered as ordered and effects monitored. Pt did not attend scheduled groups thus far this shift. Observed to be more interactive with staff and cooperative with care as shift progressed. Required increased verbal redirections to comply with unit routines.
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
## 2021-11-17 MED ORDER — BACITRACIN ZINC 500 UNIT/GM EX OINT
TOPICAL_OINTMENT | Freq: Two times a day (BID) | CUTANEOUS | Status: DC
  Administered 2021-11-17 – 2021-11-18 (×2): 1 via TOPICAL
  Filled 2021-11-17: qty 28.35

## 2021-11-17 MED ORDER — TETANUS-DIPHTHERIA TOXOIDS TD 5-2 LFU IM INJ
0.5000 mL | INJECTION | Freq: Once | INTRAMUSCULAR | Status: AC
  Administered 2021-11-17: 0.5 mL via INTRAMUSCULAR
  Filled 2021-11-17: qty 0.5

## 2021-11-17 NOTE — Progress Notes (Signed)
Pt continues to endorse passive SI without a plan. Denies HI and VH when assessed. Reports AH of chatters last night "Just a little bit". Affect is flat with logical speech. Pt is guarded, isolative to his room most of this shift. Did not attend scheduled recreation therapy group despite multiple prompts. Attended  Placed on Close Observation at night due to urges to self harm. Pt made aware and is in agreement. Returned screw to assigned provider which he kept from his move from [address] since Sunday night. Emotional support and reassurance provided to pt. Q 15 minutes safety checks remains effective without self harm gestures or outburst. All medications administered as ordered and effects monitored. Pt compliant with medications, tolerates all meals and fluids well.  Remains safe in milieu.

## 2021-11-17 NOTE — Progress Notes (Signed)
   11/17/21 0500  Sleep  Number of Hours 7

## 2021-11-17 NOTE — Group Note (Signed)
[facility] LCSW Group Therapy   Type of Therapy and Topic:  Group Therapy:  Wellness   Participation Level: Active  Description of Group: This group allows individuals to explore the 6 dimensions of wellness, including spiritual, emotional, intellectual, physical, social, environmental, financial and spiritual. Patients will learn to different ways to practice wellness to improve well-being. Patients also participated in a conversation about what wellness means to them.   Individuals will think about ways in which they currently practice wellness as well as ways they can improve their wellness and new ways to practice wellness.       Therapeutic Goals Patient will verbalize 1 pr 2 we;;mess areas where they are doing well. Patient will identify 2 areas where they would like to improve their wellness.   Patient will provide a definition of what wellness means to them.  Patients will reflect on current hospitalization and primary areas to maintain mental health to prevent re-hospitalization.     Summary of Patient Progress:  Patient participated appropriately in group and discussed how he hopes to improve his financial wellness.  Patient discussed that he has goals to go back to school and get a better job and then budget accordingly.  He also discussed being able to communicate better with landlord to get needs met.        Therapeutic Modalities Cognitive Behavioral Therapy Motivational Interviewing   Kinzlee Selvy, LCSW, [place] Social Worker  [facility]

## 2021-11-17 NOTE — [facility] Group Notes (Signed)
Pt didn't attend group. 

## 2021-11-17 NOTE — Progress Notes (Signed)
Nursing Close Observation note D:Pt observed standing in hallway. RR even and unlabored. No distress noted. Close observation for pt 7 p-7a due to pt increased SI at night A: Close observation continues for safety  R: pt remains safe
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
## 2021-11-17 NOTE — Progress Notes (Signed)
Willliam said, he needs to let me know he bruised the left side his leg. Kino said he bruise left arm. Dohn want to know if he is in trouble for what he has done today. Dana said he needs to let me know there is a piece of metal in the trash can. Writer took the trash can out the room. In the bottom trash can a metal piece object. Writer report and show to  Charity fundraiser. Writer also report to charge nurse the finding metal piece.

## 2021-11-17 NOTE — Group Note (Signed)
Recreation Therapy Group Note   Group Topic:Anger Management  Group Date: 11/17/2021 Start Time: 1005 End Time: 1020 Facilitators: Caroll Rancher, LRT,CTRS Location:  [address]   Goal Area(s) Addresses:  Patient will identify things that cause anger.  Patient will identify coping skills for anger. Patients will identify how anger coping skills can be used post d/c.  Group Description:  Anger Thermometer.  LRT individually discussed anger with patients.  Patients were to then identify 10 things that get them angry.  Patients were to then rank each instance on a thermometer from 10-1, 10 being the highest and one being the lowest.  Patients would then identify at least 5 coping skills they can use to deal with anger.   Affect/Mood: N/A   Participation Level: Did not attend    Clinical Observations/Individualized Feedback:      Plan: Continue to engage patient in RT group sessions 2-3x/week.   Caroll Rancher, LRT,CTRS 11/17/2021 11:37 AM

## 2021-11-17 NOTE — Progress Notes (Signed)
   11/17/21 2000  Psych Admission Type (Psych Patients Only)  Admission Status Voluntary  Psychosocial Assessment  Patient Complaints Anxiety  Eye Contact Fair  Facial Expression Anxious  Affect Anxious;Preoccupied  Speech Logical/coherent  Interaction Assertive  Motor Activity Slow  Appearance/Hygiene Unremarkable  Thought Process  Coherency WDL  Content Preoccupation  Delusions None reported or observed  Perception Hallucinations  Hallucination Auditory  Judgment Impaired  Confusion WDL  Danger to Self  Current suicidal ideation? Passive  Self-Injurious Behavior Some self-injurious ideation observed or expressed.  No lethal plan expressed   Agreement Not to Harm Self Yes  Description of Agreement Verbal contract for safety  Danger to Others  Danger to Others None reported or observed  Danger to Others Abnormal  Harmful Behavior to others No threats or harm toward other people

## 2021-11-18 MED ORDER — CLONIDINE HCL 0.1 MG PO TABS: 0.1000 mg | ORAL_TABLET | Freq: Two times a day (BID) | ORAL | 0 refills | Status: DC

## 2021-11-18 MED ORDER — BUSPIRONE HCL 15 MG PO TABS: 15.0000 mg | ORAL_TABLET | Freq: Two times a day (BID) | ORAL | 0 refills | Status: DC

## 2021-11-18 MED ORDER — DULOXETINE HCL 60 MG PO CPEP: 60.0000 mg | ORAL_CAPSULE | Freq: Every day | ORAL | 0 refills | Status: DC

## 2021-11-18 MED ORDER — HYDROXYZINE HCL 25 MG PO TABS: 25.0000 mg | ORAL_TABLET | Freq: Three times a day (TID) | ORAL | 0 refills | Status: DC | PRN

## 2021-11-18 MED ORDER — QUETIAPINE FUMARATE 200 MG PO TABS: 200.0000 mg | ORAL_TABLET | Freq: Two times a day (BID) | ORAL | 0 refills | Status: DC

## 2021-11-18 MED ORDER — QUETIAPINE FUMARATE 400 MG PO TABS: 400.0000 mg | ORAL_TABLET | Freq: Every day | ORAL | 0 refills | Status: DC

## 2021-11-18 MED ORDER — TRAZODONE HCL 50 MG PO TABS: 50.0000 mg | ORAL_TABLET | Freq: Every evening | ORAL | 0 refills | Status: DC | PRN

## 2021-11-18 MED ORDER — NICOTINE 21 MG/24HR TD PT24: 21.0000 mg | MEDICATED_PATCH | Freq: Every day | TRANSDERMAL | 0 refills | Status: DC

## 2021-11-18 NOTE — [facility] Suicide Risk Assessment (Signed)
[facility] INPATIENT:  Family/Significant Other Suicide Prevention Education   Suicide Prevention Education:  Contact Attempts: Sharlett Iles, [phone] has been identified by the patient as the family member/significant other with whom the patient will be residing, and identified as the person(s) who will aid the patient in the event of a mental health crisis.  With written consent from the patient, two attempts were made to provide suicide prevention education, prior to and/or following the patient's discharge.  We were unsuccessful in providing suicide prevention education.  A suicide education pamphlet was given to the patient to share with family/significant other.   Date and time of first attempt: 11/11/2021 Date and time of second attempt: 11/15/2021 CSW made final attempt on 6/8/20232  CSW completed SPE with patient. Discussed potential triggers leading to suicidal ideation in addition to coping skills one might use in order to delay and distract self from self harming behaviors. CSW encouraged patient to utilize emergency services if they felt unable to maintain their safety. SPE flyer provided to patient at this time.   Signed:  Durenda Hurt, MSW, LCSWA, LCAS 11/18/2021 10:54 AM

## 2021-11-18 NOTE — Progress Notes (Signed)
Nursing Close Observation note D:Pt sleeping in bed. RR even and unlabored. No distress noted. Close observation for pt 7 p-7a due to pt increased SI at night A: Close observation continues for safety  R: pt remains safe

## 2021-11-18 NOTE — Progress Notes (Addendum)
D: Pt A & O X 4. Denies SI, HI, AVH and pain at this time. D/C as ordered. Transported by [employer] taxi. A: D/C instructions reviewed with pt including prescriptions, medication samples and follow up appointment at [facility]; compliance encouraged. All belongings from locker 20 returned to pt at time of departure. Safety checks maintained without incident till time of d/c.  R: Pt receptive to care. Compliant with medications when offered. Denies adverse drug reactions when assessed. Verbalized understanding related to d/c instructions. Signed belonging sheet in agreement with items received from locker. Ambulatory with a steady gait. Appears to be in no physical distress at time of departure.

## 2021-11-18 NOTE — Progress Notes (Signed)
   11/18/21 0500  Sleep  Number of Hours 7

## 2021-11-18 NOTE — Progress Notes (Addendum)
  [facility] Adult Case Management Discharge Plan :  Will you be returning to the same living situation after discharge:  Yes,  Patient to return to place of residence.  At discharge, do you have transportation home?: Yes,  CSW to provide patient with cab voucher for transportation from hospital. Do you have the ability to pay for your medications: Yes,  [place] Medicaid.   Release of information consent forms completed and in the chart;  Patient's signature needed at discharge.  Patient to Follow up at:  [facility]. Go on 11/22/2021.   Specialty: Behavioral Health Why: You have an appointment on 11/22/21  at 7:30 am to obtain therapy and medication management services.  Services are provided on a first come, first served basis.  This appointment will be held in person. Contact information: [place] [phone]                Next level of care provider has access to [place] and Suicide Prevention discussed: Yes,  SPE completed with patient, CSW made multiple attempts to contact Terence Lux, friend. Left HIPAA compliant voicemail with contact information and callback request. CSW has not received any return calls from friend.    Has patient been referred to the Quitline?: Patient refused referral Tobacco Use: High Risk (11/11/2021)   Patient History    Smoking Tobacco Use: Every Day    Smokeless Tobacco Use: Never    Passive Exposure: Not on file    Patient has been referred for addiction treatment: Yes Endorses active cannabis use, referred to [facility] for outpatient mental health and SUD treatment. See appointment details above.  Social History   Substance and Sexual Activity  Alcohol Use Yes   Comment: reports periodic alcohol use   Social History   Substance and Sexual Activity  Drug Use Yes   Types: Marijuana   Comment: 1/3 gm daily     Durenda Hurt, LCSWA 11/18/2021, 10:44 AM

## 2021-11-18 NOTE — Progress Notes (Signed)
Nursing Close Observation note D:Pt sleeping in bed. RR even and unlabored. No distress noted. Close observation for pt 7 p-7a due to pt increased SI at night A: Close observation continues for safety  R: pt remains safe  

## 2021-11-18 NOTE — Progress Notes (Signed)
Pt removed the screws from a metal piece on the door and removed a metal piece from the door and placed it in the bottom of the trash can. Pt told staff it was in the bottom of the trash can. Pt on close observation for pt safety.

## 2021-11-18 NOTE — Progress Notes (Signed)
Pt A & O X4. Denies HI, AVH and pain when assessed. Presents with blunted affect, sad mood and is anxious on initial interactions. Continues to endorse passive SI, verbally contracts for safety. Assigned staff in attendance at all times. Close observation maintained without incident thus far. Scheduled medications administered with verbal education; effects monitored. Pt tolerates breakfast, fluids and medications well without discomfort. He remains safe on unit.

## 2021-11-18 NOTE — Discharge Summary (Signed)
Physician Discharge Summary Note  Patient:  Jon Clayton is an 26 y.o., male MRN:  [id number] DOB:  05-Sep-1995 Patient phone:  [phone] (home)  Patient address:   [address] [place] [address],  Total Time spent with patient: 20 minutes  Date of Admission:  11/09/2021 Date of Discharge: 11-18-2021  Reason for Admission:  ***  Principal Problem: MDD (major depressive disorder), recurrent, severe, with psychosis (HCC) Discharge Diagnoses: Principal Problem:   MDD (major depressive disorder), recurrent, severe, with psychosis (HCC) Active Problems:   Polysubstance use disorder   Past Psychiatric History: ***  Past Medical History:  Past Medical History:  Diagnosis Date   Depression    Psychosis (HCC)     Past Surgical History:  Procedure Laterality Date   NASAL SINUS SURGERY Bilateral    TONSILLECTOMY     Family History: History reviewed. No pertinent family history. Family Psychiatric  History: *** Social History:  Social History   Substance and Sexual Activity  Alcohol Use Yes   Comment: reports periodic alcohol use     Social History   Substance and Sexual Activity  Drug Use Yes   Types: Marijuana   Comment: 1/3 gm daily    Social History   Socioeconomic History   Marital status: Single    Spouse name: Not on file   Number of children: Not on file   Years of education: Not on file   Highest education level: Not on file  Occupational History   Not on file  Tobacco Use   Smoking status: Every Day    Packs/day: 1.00    Years: 11.00    Total pack years: 11.00    Types: Cigarettes   Smokeless tobacco: Never  Substance and Sexual Activity   Alcohol use: Yes    Comment: reports periodic alcohol use   Drug use: Yes    Types: Marijuana    Comment: 1/3 gm daily   Sexual activity: Not Currently  Other Topics Concern   Not on file  Social History Narrative   Not on file   Social Determinants of Health   Financial Resource Strain:  Not on file  Food Insecurity: Not on file  Transportation Needs: Not on file  Physical Activity: Not on file  Stress: Not on file  Social Connections: Not on file    Hospital Course:  ***  Physical Findings: AIMS: Facial and Oral Movements Muscles of Facial Expression: None, normal Lips and Perioral Area: None, normal Jaw: None, normal Tongue: None, normal,Extremity Movements Upper (arms, wrists, hands, fingers): None, normal Lower (legs, knees, ankles, toes): None, normal, Trunk Movements Neck, shoulders, hips: None, normal, Overall Severity Severity of abnormal movements (highest score from questions above): None, normal Incapacitation due to abnormal movements: None, normal Patient's awareness of abnormal movements (rate only patient's report): No Awareness, Dental Status Current problems with teeth and/or dentures?: No Does patient usually wear dentures?: No  CIWA:    COWS:     Musculoskeletal: Strength & Muscle Tone: {desc; muscle tone:32375} Gait & Station: {PE GAIT ED FHLK:56256} Patient leans: {Patient Leans:21022755}   Psychiatric Specialty Exam:  Presentation  General Appearance: Appropriate for Environment; Casual; Fairly Groomed  Eye Contact:Good  Speech:Normal Rate  Speech Volume:Normal  Handedness:Right   Mood and Affect  Mood:Euthymic; Anxious  Affect:Full Range   Thought Process  Thought Processes:Linear  Descriptions of Associations:Intact  Orientation:Full (Time, Place and Person)  Thought Content:Logical  History of Schizophrenia/Schizoaffective disorder:No  Duration of Psychotic Symptoms:Less than six months  Hallucinations:Hallucinations: Auditory Description of Auditory Hallucinations: none today, last occuring last night Description of Visual Hallucinations: off and on, shadows  Ideas of Reference:None  Suicidal Thoughts:Suicidal Thoughts: Yes, Passive SI Active Intent and/or Plan: Without Intent; Without  Plan  Homicidal Thoughts:Homicidal Thoughts: No HI Passive Intent and/or Plan: -- (explored carefully - 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 he denies having any intent or plan to harm that person or any other person, and dneies having thoughts to seek out any one else to harm.)   Sensorium  Memory:Immediate Good; Recent Good; Remote Good  Judgment:Fair  Insight:Fair   Executive Functions  Concentration:Fair  Attention Span:Fair  Recall:Fair  Fund of Knowledge:Fair  Language:Fair   Psychomotor Activity  Psychomotor Activity:Psychomotor Activity: Normal   Assets  Assets:Desire for Improvement; Financial Resources/Insurance; Physical Health; Communication Skills; Housing   Sleep  Sleep:Sleep: Fair    Physical Exam: Physical Exam Vitals reviewed.  Constitutional:      Appearance: He is normal weight.  Pulmonary:     Effort: Pulmonary effort is normal.  Skin:    Comments: Lacerations, superficial, RUE, LLE, no signs of infection   Neurological:     Mental Status: He is alert.  Psychiatric:        Mood and Affect: Mood normal.    Review of Systems  Psychiatric/Behavioral:  Positive for hallucinations and suicidal ideas. Negative for depression. The patient is nervous/anxious. The patient does not have insomnia.    Blood pressure 127/74, pulse 71, temperature 97.6 F (36.4 C), temperature source Oral, resp. rate 18, height 5\' 7"  (1.702 m), weight 71.9 kg, SpO2 97 %. Body mass index is 24.84 kg/m.   Social History   Tobacco Use  Smoking Status Every Day   Packs/day: 1.00   Years: 11.00   Total pack years: 11.00   Types: Cigarettes  Smokeless Tobacco Never   Tobacco Cessation:  A prescription for an FDA-approved tobacco cessation medication provided at discharge   Blood Alcohol level:  Lab Results  Component Value Date   [facility] <10 11/04/2021   ETH <10 11/03/2021    Metabolic Disorder Labs:  Lab Results  Component  Value Date   HGBA1C 4.9 11/10/2021   MPG 93.93 11/10/2021   No results found for: "PROLACTIN" Lab Results  Component Value Date   CHOL 111 11/10/2021   TRIG 98 11/10/2021   HDL 36 (L) 11/10/2021   CHOLHDL 3.1 11/10/2021   VLDL 20 11/10/2021   LDLCALC 55 11/10/2021    See Psychiatric Specialty Exam and Suicide Risk Assessment completed by Attending Physician prior to discharge.  Discharge destination:  Other:  [facility]  Is patient on multiple antipsychotic therapies at discharge:  No   Has Patient had three or more failed trials of antipsychotic monotherapy by history:  No  Recommended Plan for Multiple Antipsychotic Therapies: NA  Discharge Instructions     Diet - low sodium heart healthy   Complete by: As directed    Increase activity slowly   Complete by: As directed       Allergies as of 11/18/2021       Reactions   Pertussis Vaccines Other (See Comments)   Ped. MD said he was allergic to a medication in the vaccine.   Amoxicillin Rash   Blue Mussel [mytilus] Rash   Itchy throat   Penicillins Rash        Medication List     STOP taking these medications  feeding supplement Liqd   OLANZapine 5 MG tablet Commonly known as: ZYPREXA   sertraline 50 MG tablet Commonly known as: ZOLOFT       TAKE these medications      Indication  busPIRone 15 MG tablet Commonly known as: BUSPAR Take 1 tablet (15 mg total) by mouth 2 (two) times daily.  Indication: Anxiety Disorder, Major Depressive Disorder   cloNIDine 0.1 MG tablet Commonly known as: CATAPRES Take 1 tablet (0.1 mg total) by mouth every 12 (twelve) hours.  Indication: impulse control   DULoxetine 60 MG capsule Commonly known as: CYMBALTA Take 1 capsule (60 mg total) by mouth daily.  Indication: Major Depressive Disorder   hydrOXYzine 25 MG tablet Commonly known as: ATARAX Take 1 tablet (25 mg total) by mouth 3 (three) times daily as needed for anxiety.  Indication: Feeling  Anxious   neomycin-bacitracin-polymyxin [phone] Oint Apply 1 application. topically daily as needed (apply to excoriation).  Indication: lacerations   nicotine 21 mg/24hr patch Commonly known as: NICODERM CQ - dosed in mg/24 hours Place 1 patch (21 mg total) onto the skin daily for 28 days.  Indication: Nicotine Addiction   QUEtiapine 400 MG tablet Commonly known as: SEROQUEL Take 1 tablet (400 mg total) by mouth at bedtime.  Indication: Major Depressive Disorder   QUEtiapine 200 MG tablet Commonly known as: SEROQUEL Take 1 tablet (200 mg total) by mouth 2 (two) times daily.  Indication: Major Depressive Disorder   traZODone 50 MG tablet Commonly known as: DESYREL Take 1 tablet (50 mg total) by mouth at bedtime as needed for sleep.  Indication: Trouble Sleeping        Follow-up Information     [facility]. Go on 11/22/2021.   Specialty: Behavioral Health Why: You have an appointment on 11/22/21  at 7:30 am to obtain therapy and medication management services.  Services are provided on a first come, first served basis.  This appointment will be held in person. Contact information: [address] [place] [id number] [phone]                Follow-up recommendations:  {[facility] DC FU RECOMMENDATIONS:22620}  Comments:  ***  Signed: Cristy HiltsNathan W Joson Sapp, MD 11/18/2021, 9:30 AM   Total Time Spent in Direct Patient Care:  I personally spent 40 minutes on the unit in direct patient care. The direct patient care time included face-to-face time with the patient, reviewing the patient's chart, communicating with other professionals, and coordinating care. Greater than 50% of this time was spent in counseling or coordinating care with the patient regarding goals of hospitalization, psycho-education, and discharge planning needs.   Phineas InchesNathan Cythina Mickelsen, MD Psychiatrist
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
## 2021-11-18 NOTE — Group Note (Signed)
Recreation Therapy Group Note   Group Topic:Goal Setting  Group Date: 11/18/2021 Start Time: 1000 End Time: 1020 Facilitators: Caroll Rancher, LRT,CTRS Location: [address] Dayroom   Goal Area(s) Addresses:  Patient will participate in discussion of what a goal is. Patient will successfully identify goals they want to complete. Patient will identify how goals can be beneficial post d/c.  Group Description:  LRT and patients had a discussion on what goals are and how they are beneficial.  Patients were then a worksheet where they were to identify goals they hope to accomplish in a week, month, year and five years.  Patients would identify obstacles to those goals, what they need to reach those goals and what they can start doing right now to work towards goals.   Affect/Mood: N/A   Participation Level: Did not attend    Clinical Observations/Individualized Feedback:     Plan: Continue to engage patient in RT group sessions 2-3x/week.   Caroll Rancher, LRT,CTRS  11/18/2021 11:50 AM

## 2021-11-22 ENCOUNTER — Emergency Department (HOSPITAL_COMMUNITY)
Admission: EM | Admit: 2021-11-22 | Discharge: 2021-11-25 | Disposition: A | Discharge status: Other Acute Inpatient Hospital | Payer: Medicaid Other | Attending: Emergency Medicine | Admitting: Emergency Medicine

## 2021-11-22 ENCOUNTER — Other Ambulatory Visit: Payer: Self-pay

## 2021-11-22 ENCOUNTER — Ambulatory Visit (HOSPITAL_COMMUNITY)
Admission: EM | Admit: 2021-11-22 | Discharge: 2021-11-22 | Payer: Medicaid Other | Attending: Psychiatry | Admitting: Psychiatry

## 2021-11-22 DIAGNOSIS — Z20822 Contact with and (suspected) exposure to covid-19: Secondary | ICD-10-CM | POA: Insufficient documentation

## 2021-11-22 DIAGNOSIS — Z91148 Patient's other noncompliance with medication regimen for other reason: Secondary | ICD-10-CM | POA: Insufficient documentation

## 2021-11-22 DIAGNOSIS — F4310 Post-traumatic stress disorder, unspecified: Secondary | ICD-10-CM | POA: Diagnosis not present

## 2021-11-22 DIAGNOSIS — Z79899 Other long term (current) drug therapy: Secondary | ICD-10-CM | POA: Diagnosis not present

## 2021-11-22 DIAGNOSIS — Y900 Blood alcohol level of less than 20 mg/100 ml: Secondary | ICD-10-CM | POA: Diagnosis not present

## 2021-11-22 DIAGNOSIS — R440 Auditory hallucinations: Secondary | ICD-10-CM | POA: Diagnosis present

## 2021-11-22 DIAGNOSIS — R45851 Suicidal ideations: Secondary | ICD-10-CM | POA: Diagnosis not present

## 2021-11-22 DIAGNOSIS — F419 Anxiety disorder, unspecified: Secondary | ICD-10-CM | POA: Insufficient documentation

## 2021-11-22 DIAGNOSIS — F1210 Cannabis abuse, uncomplicated: Secondary | ICD-10-CM | POA: Insufficient documentation

## 2021-11-22 DIAGNOSIS — Z046 Encounter for general psychiatric examination, requested by authority: Secondary | ICD-10-CM | POA: Diagnosis not present

## 2021-11-22 DIAGNOSIS — F333 Major depressive disorder, recurrent, severe with psychotic symptoms: Secondary | ICD-10-CM | POA: Diagnosis present

## 2021-11-22 DIAGNOSIS — R441 Visual hallucinations: Secondary | ICD-10-CM | POA: Insufficient documentation

## 2021-11-22 DIAGNOSIS — F1910 Other psychoactive substance abuse, uncomplicated: Secondary | ICD-10-CM | POA: Insufficient documentation

## 2021-11-22 DIAGNOSIS — F1990 Other psychoactive substance use, unspecified, uncomplicated: Secondary | ICD-10-CM | POA: Diagnosis present

## 2021-11-22 DIAGNOSIS — F32A Depression, unspecified: Secondary | ICD-10-CM | POA: Insufficient documentation

## 2021-11-22 LAB — COMPREHENSIVE METABOLIC PANEL
ALT: 22 U/L (ref 0–44)
AST: 19 U/L (ref 15–41)
Albumin: 3.7 g/dL (ref 3.5–5.0)
Alkaline Phosphatase: 49 U/L (ref 38–126)
Anion gap: 7 (ref 5–15)
BUN: 10 mg/dL (ref 6–20)
CO2: 26 mmol/L (ref 22–32)
Calcium: 8.6 mg/dL — ABNORMAL LOW (ref 8.9–10.3)
Chloride: 109 mmol/L (ref 98–111)
Creatinine, Ser: 1.19 mg/dL (ref 0.61–1.24)
GFR, Estimated: >60 mL/min (ref >60)
Glucose, Bld: 84 mg/dL (ref 70–99)
Potassium: 3.9 mmol/L (ref 3.5–5.1)
Sodium: 142 mmol/L (ref 135–145)
Total Bilirubin: 0.6 mg/dL (ref 0.3–1.2)
Total Protein: 5.8 g/dL — ABNORMAL LOW (ref 6.5–8.1)

## 2021-11-22 LAB — CBC WITH DIFFERENTIAL/PLATELET
Abs Immature Granulocytes: 0.07 10*3/uL (ref 0.00–0.07)
Basophils Absolute: 0 10*3/uL (ref 0.0–0.1)
Basophils Relative: 0 %
Eosinophils Absolute: 0.2 10*3/uL (ref 0.0–0.5)
Eosinophils Relative: 1 %
HCT: 42.7 % (ref 39.0–52.0)
Hemoglobin: 13.7 g/dL (ref 13.0–17.0)
Immature Granulocytes: 1 %
Lymphocytes Relative: 29 %
Lymphs Abs: 3.4 10*3/uL (ref 0.7–4.0)
MCH: 29.6 pg (ref 26.0–34.0)
MCHC: 32.1 g/dL (ref 30.0–36.0)
MCV: 92.2 fL (ref 80.0–100.0)
Monocytes Absolute: 0.6 10*3/uL (ref 0.1–1.0)
Monocytes Relative: 5 %
Neutro Abs: 7.6 10*3/uL (ref 1.7–7.7)
Neutrophils Relative %: 64 %
Platelets: 224 10*3/uL (ref 150–400)
RBC: 4.63 MIL/uL (ref 4.22–5.81)
RDW: 14.1 % (ref 11.5–15.5)
WBC: 11.9 10*3/uL — ABNORMAL HIGH (ref 4.0–10.5)
nRBC: 0 % (ref 0.0–0.2)

## 2021-11-22 LAB — ACETAMINOPHEN LEVEL
Acetaminophen (Tylenol), Serum: <10 ug/mL — ABNORMAL LOW (ref 10–30)
Acetaminophen (Tylenol), Serum: <10 ug/mL — ABNORMAL LOW (ref 10–30)

## 2021-11-22 LAB — URINALYSIS, ROUTINE W REFLEX MICROSCOPIC
Bilirubin Urine: NEGATIVE
Glucose, UA: NEGATIVE mg/dL
Hgb urine dipstick: NEGATIVE
Ketones, ur: NEGATIVE mg/dL
Leukocytes,Ua: NEGATIVE
Nitrite: NEGATIVE
Protein, ur: NEGATIVE mg/dL
Specific Gravity, Urine: 1.011 (ref 1.005–1.030)
pH: 8 (ref 5.0–8.0)

## 2021-11-22 LAB — RAPID URINE DRUG SCREEN, HOSP PERFORMED
Amphetamines: NOT DETECTED
Barbiturates: NOT DETECTED
Benzodiazepines: NOT DETECTED
Cocaine: NOT DETECTED
Opiates: NOT DETECTED
Tetrahydrocannabinol: POSITIVE — AB

## 2021-11-22 LAB — ETHANOL: Alcohol, Ethyl (B): <10 mg/dL (ref <10)

## 2021-11-22 LAB — RESP PANEL BY RT-PCR (FLU A&B, COVID) ARPGX2
Influenza A by PCR: NEGATIVE
Influenza B by PCR: NEGATIVE
SARS Coronavirus 2 by RT PCR: NEGATIVE

## 2021-11-22 LAB — MAGNESIUM: Magnesium: 2.1 mg/dL (ref 1.7–2.4)

## 2021-11-22 LAB — SALICYLATE LEVEL: Salicylate Lvl: <7 mg/dL — ABNORMAL LOW (ref 7.0–30.0)

## 2021-11-22 NOTE — ED Triage Notes (Addendum)
[place], about 1 hour ago took 10-15 buspar and has some superficial cuts on arms. Was having auditory hallucinations.   Command hallucinations from "jack"

## 2021-11-22 NOTE — ED Notes (Signed)
Pt wanded and placed into hospital provided scrubs. Pt belonging taken.

## 2021-11-22 NOTE — Discharge Instructions (Signed)
Transfer to [facility] for medical clearance. °
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
## 2021-11-22 NOTE — ED Notes (Signed)
Staffing office called no sitters available at this time

## 2021-11-22 NOTE — ED Provider Notes (Signed)
[facility] Provider Note   CSN: [id number] Arrival date & time: 11/22/21  1831     History  No chief complaint on file.   Jon Clayton is a 26 y.o. male with a past medical history of PTSD, polysubstance use disorder, MDD, suicidal ideations, who presents to the emergency department complaining of suicidal ideation onset prior to arrival.  Also complains of overdose.  Patient took approximately 10-15 of his 15 mg prescription BuSpar while at behavioral health urgent care.  Also went to the bathroom at [facility] and cut his left forearm multiple times with razors.  Patient was sent over to be medically cleared prior to TTS consult.  Provider through be had noted that patient met criteria for inpatient admission.  Patient has associated SI, auditory command hallucinations in the form of "Barnabas Lister".  His hallucinations are telling him to harm himself.  Denies visual hallucinations or HI at this time.  The history is provided by the patient. No language interpreter was used.       Home Medications Prior to Admission medications   Medication Sig Start Date End Date Taking? Authorizing Provider  busPIRone (BUSPAR) 15 MG tablet Take 1 tablet (15 mg total) by mouth 2 (two) times daily. 11/18/21 12/18/21 Yes Massengill, Ovid Curd, MD  cloNIDine (CATAPRES) 0.1 MG tablet Take 1 tablet (0.1 mg total) by mouth every 12 (twelve) hours. 11/18/21  Yes Massengill, Ovid Curd, MD  DULoxetine (CYMBALTA) 60 MG capsule Take 1 capsule (60 mg total) by mouth daily. 11/18/21 12/18/21 Yes Massengill, Ovid Curd, MD  hydrOXYzine (ATARAX) 25 MG tablet Take 1 tablet (25 mg total) by mouth 3 (three) times daily as needed for anxiety. 11/18/21 12/18/21 Yes Massengill, Ovid Curd, MD  neomycin-bacitracin-polymyxin [phone] OINT Apply 1 application. topically daily as needed (apply to excoriation). 11/09/21  Yes Ival Bible, MD  QUEtiapine (SEROQUEL) 200 MG tablet Take 1 tablet (200 mg total) by mouth 2  (two) times daily. 11/18/21 12/18/21 Yes Massengill, Ovid Curd, MD  QUEtiapine (SEROQUEL) 400 MG tablet Take 1 tablet (400 mg total) by mouth at bedtime. 11/18/21 12/18/21 Yes Massengill, Ovid Curd, MD  traZODone (DESYREL) 50 MG tablet Take 1 tablet (50 mg total) by mouth at bedtime as needed for sleep. 11/18/21 12/18/21 Yes Massengill, Ovid Curd, MD  nicotine (NICODERM CQ - DOSED IN MG/24 HOURS) 21 mg/24hr patch Place 1 patch (21 mg total) onto the skin daily for 28 days. Patient not taking: Reported on 11/22/2021 11/18/21 12/16/21  Janine Limbo, MD      Allergies    Pertussis vaccines, Amoxicillin, Delmi Fulfer mussel [mytilus], and Penicillins    Review of Systems   Review of Systems  Psychiatric/Behavioral:  Positive for hallucinations (Auditory), self-injury and suicidal ideas. Negative for agitation and behavioral problems.   All other systems reviewed and are negative.   Physical Exam Updated Vital Signs BP 103/67 (BP Location: Left Arm)   Pulse 72   Temp 97.8 F (36.6 C) (Oral)   Resp 16   SpO2 96%  Physical Exam Vitals and nursing note reviewed.  Constitutional:      General: He is not in acute distress.    Appearance: He is not diaphoretic.  HENT:     Head: Normocephalic and atraumatic.     Mouth/Throat:     Pharynx: No oropharyngeal exudate.  Eyes:     General: No scleral icterus.    Conjunctiva/sclera: Conjunctivae normal.  Cardiovascular:     Rate and Rhythm: Normal rate and regular rhythm.  Pulses: Normal pulses.     Heart sounds: Normal heart sounds.  Pulmonary:     Effort: Pulmonary effort is normal. No respiratory distress.     Breath sounds: Normal breath sounds. No wheezing.  Abdominal:     General: Bowel sounds are normal.     Palpations: Abdomen is soft. There is no mass.     Tenderness: There is no abdominal tenderness. There is no guarding or rebound.  Musculoskeletal:        General: Normal range of motion.     Cervical back: Normal range of motion and neck supple.   Skin:    General: Skin is warm and dry.  Neurological:     Mental Status: He is alert.  Psychiatric:        Behavior: Behavior normal.     ED Results / Procedures / Treatments   Labs (all labs ordered are listed, but only abnormal results are displayed) Labs Reviewed  COMPREHENSIVE METABOLIC PANEL - Abnormal; Notable for the following components:      Result Value   Calcium 8.6 (*)    Total Protein 5.8 (*)    All other components within normal limits  RAPID URINE DRUG SCREEN, HOSP PERFORMED - Abnormal; Notable for the following components:   Tetrahydrocannabinol POSITIVE (*)    All other components within normal limits  CBC WITH DIFFERENTIAL/PLATELET - Abnormal; Notable for the following components:   WBC 11.9 (*)    All other components within normal limits  SALICYLATE LEVEL - Abnormal; Notable for the following components:   Salicylate Lvl <5.6 (*)    All other components within normal limits  ACETAMINOPHEN LEVEL - Abnormal; Notable for the following components:   Acetaminophen (Tylenol), Serum <10 (*)    All other components within normal limits  URINALYSIS, ROUTINE W REFLEX MICROSCOPIC - Abnormal; Notable for the following components:   APPearance CLOUDY (*)    All other components within normal limits  ACETAMINOPHEN LEVEL - Abnormal; Notable for the following components:   Acetaminophen (Tylenol), Serum <10 (*)    All other components within normal limits  RESP PANEL BY RT-PCR (FLU A&B, COVID) ARPGX2  ETHANOL  MAGNESIUM    EKG EKG Interpretation  Date/Time:  Monday November 22 2021 20:14:55 EDT Ventricular Rate:  78 PR Interval:  150 QRS Duration: 86 QT Interval:  388 QTC Calculation: 442 R Axis:   48 Text Interpretation: Normal sinus rhythm Normal ECG When compared with ECG of 09-Nov-2021 14:18, PREVIOUS ECG IS PRESENT Confirmed by Dene Gentry [phone]) on 11/22/2021 9:43:38 PM  Radiology No results found.  Procedures Procedures    Medications Ordered  in ED Medications - No data to display  ED Course/ Medical Decision Making/ A&P Clinical Course as of 11/23/21 0008  Mon Nov 22, 2021  1914 Consult with poison control and spoke with Chong Sicilian who recommended obtaining a 4-hour Tylenol level and monitoring patient's vitals.   [SB]  2130 Patient reevaluated and resting comfortably [SB]  2216 Poison control, repeat tylenol at 10:30 pm and will be medically cleared.  [SB]  2315 Consult with poison control, who are reassured with 4 hour tylenol levels. Patient medically cleared for TTS consult at this time.  [SB]    Clinical Course User Index [SB] Fany Cavanaugh A, PA-C                           Medical Decision Making Amount and/or Complexity of Data Reviewed Labs: ordered.  Pt presents with concerns for suicidal ideation onset today.  Also overdosed due to taking approximately 10-15 of his prescription 15 mg BuSpar.  Also superficial cuts noted to his left arm.  Patient also has auditory hallucinations that are command hallucinations from "track".  Vital signs stable, patient afebrile. On exam, pt with no acute cardiovascular, respiratory, abdominal exam findings.   Additional history obtained:  External records from outside source obtained and reviewed including: Patient was evaluated at behavioral health urgent care today and sent to the emergency department for medical clearance.  At that time was noted the patient meets inpatient criteria.  Labs:  I ordered, and personally interpreted labs.  The pertinent results include:   Salicylate level less than 7 unremarkable, ethanol less than 10 unremarkable. Initial acetaminophen level less than 10, 4-hour acetaminophen level less than 10. Magnesium at 2.1. CBC   Disposition: Suspicious for suicidal ideation and overdose. Labs without acute findings, patient medically cleared at this time by myself as well as poison control. TTS consult placed.   This chart was dictated using voice  recognition software, Dragon. Despite the best efforts of this provider to proofread and correct errors, errors may still occur which can change documentation meaning.  Final Clinical Impression(s) / ED Diagnoses Final diagnoses:  Suicidal ideation    Rx / DC Orders ED Discharge Orders     None         Everette Dimauro A, PA-C 11/23/21 0009    Valarie Merino, MD 11/23/21 [phone]

## 2021-11-22 NOTE — ED Provider Notes (Signed)
Behavioral Health Urgent Care Medical Screening Exam  Patient Name: Jon Clayton MRN: [id number] Date of Evaluation: 11/22/21 Chief Complaint:   Diagnosis:  Final diagnoses:  Suicidal ideations   Subjective: Jon Clayton 26 y.o., male patient presented to [facility] as a walk in home suicidal ideations and increased depression.  Jon Clayton, 26 y.o., male patient seen face to face by this provider, consulted with Dr. Bronwen Betters; and chart reviewed on 11/22/21.  Per chart review patient has a history of depression, polysubstance use, PTSD and SI.  He was admitted to [facility] from 11/09/2021-11/18/2021.  He was discharged on BuSpar 15 mg twice daily, clonidine 0.1 mg twice daily, duloxetine 60 mg daily, hydroxyzine 25 mg 3 times daily as needed, and Seroquel 400 mg nightly.  Patient reports he has not been compliant with medications upon discharge.  Notified by nursing staff that while patient was waiting to be assessed he went into the bathroom in the lobby and reports he took 15 BuSpar tablets unknown dosage. He also took a knife and made superficial cuts on his left inner forearm.  Patient was scanned by security and multiple knives and razor blades were obtained.    During evaluation Jon Clayton is walking around the assessment room.  He does not appear to be in any acute distress.  He currently complains of feeling dizzy and feeling a burning down his throat after he took the medications. He denies any shortness of breath. His eyes appear to be glassy.  Reports he has "dabbed marijuana multiple times a day".  He is alert/oriented x4.  He is slow to respond.  He is cooperative and inattentive.  He endorses increased depression and anxiety.  He endorses suicidal ideations with plan to jump in front of traffic, overdose, or cut himself.  He does not own firearms but states he does know where to get them.  He cannot contract for safety.  He endorses auditory hallucinations of hearing a voice named "[name]".   [name] tells him to kill himself.  He endorses visual hallucinations of seeing "[name]".  States most of the time [name] is a black shadow.  He endorses a feelings of paranoia, states "people are out to get him".  He does not appear to be responding to internal/external stimuli.   Of note: Patient is a registered sex offender.  He also reported that when he is in a psychiatric hospital he has to be in his separate room because he has urges to "touch people".  Patient will be transferred to the [facility] emergency department for medical clearance.  Dr. Rodena Medin is the accepting physician.   Psychiatric Specialty Exam  Presentation  General Appearance:Disheveled  Eye Contact:Fleeting  Speech:Clear and Coherent; Normal Rate  Speech Volume:Normal  Handedness:Right   Mood and Affect  Mood:Anxious; Depressed  Affect:Depressed; Congruent   Thought Process  Thought Processes:Coherent  Descriptions of Associations:Intact  Orientation:Full (Time, Place and Person)  Thought Content:Logical  Diagnosis of Schizophrenia or Schizoaffective disorder in past: No  Duration of Psychotic Symptoms: Less than six months  Hallucinations:Auditory a voice named [name] tells him to kill himself sees shadows  Ideas of Reference:Paranoia  Suicidal Thoughts:Yes, Active With Intent; With Plan; With Means to Carry Out Without Plan; Without Intent; Without Access to Means  Homicidal Thoughts:No -- (explored carefully - 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 he denies having any intent or plan to harm that person or any other person, and dneies having thoughts  to seek out any one else to harm.)   Sensorium  Memory:Immediate Fair; Recent Fair; Remote Fair  Judgment:Poor  Insight:Poor   Executive Functions  Concentration:Fair  Attention Span:Fair  Sebastian   Psychomotor Activity  Psychomotor Activity:Normal   Assets   Assets:Communication Skills; Desire for Improvement; Physical Health; Resilience   Sleep  Sleep:Fair  Number of hours: 7   No data recorded  Physical Exam: Physical Exam Vitals and nursing note reviewed.  Constitutional:      Appearance: He is well-developed.  HENT:     Head: Normocephalic and atraumatic.  Eyes:     General:        Right eye: No discharge.        Left eye: No discharge.     Conjunctiva/sclera: Conjunctivae normal.  Cardiovascular:     Rate and Rhythm: Normal rate.  Pulmonary:     Effort: Pulmonary effort is normal. No respiratory distress.  Musculoskeletal:        General: Normal range of motion.     Cervical back: Normal range of motion.  Skin:    Coloration: Skin is not jaundiced or pale.  Neurological:     Mental Status: He is alert and oriented to person, place, and time.  Psychiatric:        Attention and Perception: He is inattentive. He perceives auditory and visual hallucinations.        Mood and Affect: Mood is anxious and depressed.        Speech: Speech normal.        Behavior: Behavior is slowed. Behavior is cooperative.        Thought Content: Thought content includes suicidal ideation. Thought content includes suicidal plan.        Cognition and Memory: Cognition normal.        Judgment: Judgment is impulsive.    Review of Systems  Constitutional: Negative.   HENT: Negative.    Eyes: Negative.   Respiratory: Negative.    Cardiovascular: Negative.   Gastrointestinal:  Positive for heartburn.  Musculoskeletal: Negative.   Skin: Negative.   Neurological:  Positive for dizziness.  Psychiatric/Behavioral:  Positive for depression, hallucinations, substance abuse and suicidal ideas. The patient is nervous/anxious.    Blood pressure 117/69, pulse 96, temperature 98 F (36.7 C), temperature source Oral, resp. rate 18, SpO2 98 %. There is no height or weight on file to calculate BMI.  Musculoskeletal: Strength & Muscle Tone: within  normal limits Gait & Station: normal Patient leans: N/A   [facility] MSE Discharge Disposition for Follow up and Recommendations: Based on my evaluation the patient appears to have an emergency medical condition for which I recommend the patient be transferred to the emergency department for further evaluation.   Discharge patient in transfer to the [facility] emergency department.  Dr. Francia Greaves EDP notified and has accepted patient. Lexicographer notified.   Patient meets criteria for inpatient psychiatric admission.  Social work notified and patient has been faxed out.  [facility] has no available beds at this time.  Revonda Humphrey, NP 11/22/2021, 6:05 PM

## 2021-11-23 MED ORDER — TRAZODONE HCL 50 MG PO TABS
50.0000 mg | ORAL_TABLET | Freq: Every evening | ORAL | Status: DC | PRN
  Administered 2021-11-23 – 2021-11-24 (×2): 50 mg via ORAL
  Filled 2021-11-23 (×2): qty 1

## 2021-11-23 MED ORDER — HYDROXYZINE HCL 25 MG PO TABS: 25.0000 mg | ORAL_TABLET | Freq: Three times a day (TID) | ORAL | Status: DC | PRN

## 2021-11-23 MED ORDER — DULOXETINE HCL 60 MG PO CPEP
60.0000 mg | ORAL_CAPSULE | Freq: Every day | ORAL | Status: DC
  Administered 2021-11-23 – 2021-11-25 (×3): 60 mg via ORAL
  Filled 2021-11-23 (×3): qty 1

## 2021-11-23 MED ORDER — QUETIAPINE FUMARATE 200 MG PO TABS
200.0000 mg | ORAL_TABLET | Freq: Every day | ORAL | Status: DC
  Administered 2021-11-23 – 2021-11-24 (×2): 200 mg via ORAL
  Filled 2021-11-23 (×2): qty 1

## 2021-11-23 MED ORDER — CLONIDINE HCL 0.2 MG PO TABS
0.1000 mg | ORAL_TABLET | Freq: Two times a day (BID) | ORAL | Status: DC
  Administered 2021-11-23 – 2021-11-25 (×5): 0.1 mg via ORAL
  Filled 2021-11-23 (×5): qty 1

## 2021-11-23 MED ORDER — BUSPIRONE HCL 10 MG PO TABS: 15.0000 mg | ORAL_TABLET | Freq: Two times a day (BID) | ORAL | Status: DC

## 2021-11-23 NOTE — Consult Note (Signed)
Consult note for medication management:   Patient was seen and evaluated at the [facility] on 11/22/21 for intentional overdose on Buspar and self injurious behaviors by cutting himself with a knife to his left forearm. Patient is currently awaiting inpatient treatment. Patient has been faxed outpatient for placement by C. Sabra Heck, psychiatry CSW.   Per chart review: Patient was prescribed at [facility] from 11/09/21 to 11/19/2021: BuSpar 15 mg p.o. twice daily, clonidine 0.1 mg p.o. twice daily, Cymbalta 60 mg p.o. daily, Vistaril 25 mg 3 times daily as needed for anxiety, Seroquel 400 mg p.o. nightly, Seroquel 200 mg p.o. twice daily, and trazodone 50 mg p.o. nightly as needed for sleep.  EKG QTC is 442.  Will restart the following home medications: -Cymbalta 60 mg p.o. daily for MDD -Clonidine 0.1 mg p.o. twice daily for impulse control -Trazodone 50 mg p.o. nightly as needed for sleep -Vistaril 25 mg p.o. 3 times daily as needed for anxiety -Seroquel changed from 400 mg po nightly to currently 200 mg p.o. nightly. Recommend titrating back up to 400 mg po nightly over the course of treatment. -We will hold off on Seroquel 200 mg p.o. twice daily for MDD recommend adding during course of treatment. -We will hold off on BuSpar 15 mg p.o. twice daily due to unknown amount of ingestion on 11/22/2021.

## 2021-11-23 NOTE — Progress Notes (Signed)
Inpatient Behavioral Health Placement  Pt meets inpatient criteria per [facility].  There are no available beds at [facility] per [facility], RN. Referral was sent to the following facilities;   Destination Service Provider Address Phone Fax  [facility]  [address] [place], [place] [address] [phone]  [facility]  [address] [place] [address] [phone]  [facility]  [address] [place] [address] [phone]  [facility]  [phone] [place] [address] [phone]  [facility]  [address] [place] [address] [phone]  [facility]  [address], [place] [address] [phone]  [facility]  [address], [place] [address] [phone]  [facility]  [address], [place] [address] [phone]  [facility]  [address], [place] [address] [phone]  [facility]  [address] [place] [address] [phone]  [facility]  [address] [place] [address] [phone]  [facility]  [address] [place], [place] [address] [phone]  [facility]  [address], [place] [address] [phone]    Situation ongoing,  CSW will follow up.   Maryjean Ka, MSW, [facility] 11/23/2021  @ 3:03 PM

## 2021-11-24 ENCOUNTER — Encounter (HOSPITAL_COMMUNITY): Payer: Self-pay | Admitting: Registered Nurse

## 2021-11-24 DIAGNOSIS — F4310 Post-traumatic stress disorder, unspecified: Secondary | ICD-10-CM | POA: Diagnosis not present

## 2021-11-24 DIAGNOSIS — F333 Major depressive disorder, recurrent, severe with psychotic symptoms: Secondary | ICD-10-CM

## 2021-11-24 DIAGNOSIS — R45851 Suicidal ideations: Secondary | ICD-10-CM | POA: Diagnosis not present

## 2021-11-24 MED ORDER — QUETIAPINE FUMARATE 100 MG PO TABS
100.0000 mg | ORAL_TABLET | Freq: Two times a day (BID) | ORAL | Status: DC
  Administered 2021-11-24 – 2021-11-25 (×3): 100 mg via ORAL
  Filled 2021-11-24 (×3): qty 1

## 2021-11-24 NOTE — Consult Note (Signed)
Telepsych Consultation   Reason for Consult:  Suicidal ideation Referring Physician:  Karenann Cai, PA-C Location of Patient: [facility] ED Location of Provider: Other: [facility]  Patient Identification: Jon Clayton MRN:  [id number] Principal Diagnosis: MDD (major depressive disorder), recurrent, severe, with psychosis (HCC) Diagnosis:  Principal Problem:   MDD (major depressive disorder), recurrent, severe, with psychosis (HCC) Active Problems:   PTSD (post-traumatic stress disorder)   Suicidal ideations   Polysubstance use disorder   Total Time spent with patient: 30 minutes  Subjective:   Jon Clayton is a 26 y.o. male patient admitted to [facility] ED after being transferred for [facility] where he initially presented as a walk in with complaints of worsening depression and suicidal ideation.  While waiting to be seen he went into bathroom in lobby and then reported that he had taken 15 Buspar tablets and made multiple superficial cuts to his left forearm.  After being scanned by security multiple knives and razor blades were found on patient.   HPI:  Jon Clayton, 26 y.o., male patient seen via tele health by this provider, consulted with Dr. Nelly Rout; and chart reviewed on 11/24/21.  On evaluation Jon Clayton reports he is having a problem with impulse control and that a male voice is telling him to hurt and kill himself.  States that the voice is also telling him to do inappropriate thing like touch others.  Patient [facility] discharged [facility] and was to follow up with [facility] "That is why I was there but the lady said I didn't have an appointment and that I would have to come back during walk in hours."  Patient states that he was at [facility] for [facility] days "and I thought I was okay but it got to hard and this time something is different.  My impulse control is off."  Patient states he is unable to control himself and feels that he will do as the male voice is telling him.  He denies homicidal  ideation.   During evaluation Jon Clayton is pacing around in room with no noted distress.  He is alert, oriented x 4, calm, cooperative with anxious mood.  He does not appear to be responding to internal/external stimuli or delusional thoughts; other than his statement that he is hearing a male voice telling him to do things.  He does appear restless and is pacing room during entire assessment.  He denies homicidal ideation; but is unable to contract for safety related to the voice telling him to hurt or kill himself and to touch others inappropriately.  Feels he will do as the voice is telling him unable to control it.    [facility] discharged patient home on the following medications:   BuSpar 15 mg Bid  clonidine 0.1 mg Bid  Cymbalta 60 mg Qd Vistaril 25 mg Tid prn anxiety,  Seroquel 400 mg Q hs  Seroquel 200 mg Bid trazodone 50 mg Q hs prn sleep.   Past Psychiatric History:  MDD with psychotic features, PTSD, polysubstance use, dissociative disorder  Risk to Self:  Yes Risk to Others:  Yes Prior Inpatient Therapy:  Yes Prior Outpatient Therapy:  Yes  Past Medical History:  Past Medical History:  Diagnosis Date   Depression    Psychosis (HCC)     Past Surgical History:  Procedure Laterality Date   NASAL SINUS SURGERY Bilateral    TONSILLECTOMY     Family History: History reviewed. No pertinent family history. Family  Psychiatric  History: None reported Social History:  Social History   Substance and Sexual Activity  Alcohol Use Yes   Comment: reports periodic alcohol use     Social History   Substance and Sexual Activity  Drug Use Yes   Types: Marijuana   Comment: 1/3 gm daily    Social History   Socioeconomic History   Marital status: Single    Spouse name: Not on file   Number of children: Not on file   Years of education: Not on file   Highest education level: Not on file  Occupational History   Not on file  Tobacco Use   Smoking status: Every Day     Packs/day: 1.00    Years: 11.00    Total pack years: 11.00    Types: Cigarettes   Smokeless tobacco: Never  Substance and Sexual Activity   Alcohol use: Yes    Comment: reports periodic alcohol use   Drug use: Yes    Types: Marijuana    Comment: 1/3 gm daily   Sexual activity: Not Currently  Other Topics Concern   Not on file  Social History Narrative   Not on file   Social Determinants of Health   Financial Resource Strain: Not on file  Food Insecurity: Not on file  Transportation Needs: Not on file  Physical Activity: Not on file  Stress: Not on file  Social Connections: Not on file   Additional Social History:    Allergies:   Allergies  Allergen Reactions   Pertussis Vaccines Other (See Comments)    Ped. MD said he was allergic to a medication in the vaccine.   Amoxicillin Rash   Blue Mussel [Mytilus] Rash    Itchy throat   Penicillins Rash    Labs:  Results for orders placed or performed during the hospital encounter of 11/22/21 (from the past 48 hour(s))  Urine rapid drug screen (hosp performed)     Status: Abnormal   Collection Time: 11/22/21  7:18 PM  Result Value Ref Range   Opiates NONE DETECTED NONE DETECTED   Cocaine NONE DETECTED NONE DETECTED   Benzodiazepines NONE DETECTED NONE DETECTED   Amphetamines NONE DETECTED NONE DETECTED   Tetrahydrocannabinol POSITIVE (A) NONE DETECTED   Barbiturates NONE DETECTED NONE DETECTED    Comment: (NOTE) DRUG SCREEN FOR MEDICAL PURPOSES ONLY.  IF CONFIRMATION IS NEEDED FOR ANY PURPOSE, NOTIFY LAB WITHIN 5 DAYS.  LOWEST DETECTABLE LIMITS FOR URINE DRUG SCREEN Drug Class                     Cutoff (ng/mL) Amphetamine and metabolites    1000 Barbiturate and metabolites    200 Benzodiazepine                 200 Tricyclics and metabolites     300 Opiates and metabolites        300 Cocaine and metabolites        300 THC                            50 Performed at [facility], [address]  [place], [place] [id number]   Urinalysis, Routine w reflex microscopic Urine, Clean Catch     Status: Abnormal   Collection Time: 11/22/21  7:18 PM  Result Value Ref Range   Color, Urine YELLOW YELLOW   APPearance CLOUDY (A) CLEAR   Specific Gravity, Urine 1.011  1.005 - 1.030   pH 8.0 5.0 - 8.0   Glucose, UA NEGATIVE NEGATIVE mg/dL   Hgb urine dipstick NEGATIVE NEGATIVE   Bilirubin Urine NEGATIVE NEGATIVE   Ketones, ur NEGATIVE NEGATIVE mg/dL   Protein, ur NEGATIVE NEGATIVE mg/dL   Nitrite NEGATIVE NEGATIVE   Leukocytes,Ua NEGATIVE NEGATIVE    Comment: Performed at [facility], [address], [place], [place] [address]  Resp Panel by RT-PCR (Flu A&B, Covid) Anterior Nasal Swab     Status: None   Collection Time: 11/22/21  8:11 PM   Specimen: Anterior Nasal Swab  Result Value Ref Range   SARS Coronavirus 2 by RT PCR NEGATIVE NEGATIVE    Comment: (NOTE) SARS-CoV-2 target nucleic acids are NOT DETECTED.  The SARS-CoV-2 RNA is generally detectable in upper respiratory specimens during the acute phase of infection. The lowest concentration of SARS-CoV-2 viral copies this assay can detect is 138 copies/mL. A negative result does not preclude SARS-Cov-2 infection and should not be used as the sole basis for treatment or other patient management decisions. A negative result may occur with  improper specimen collection/handling, submission of specimen other than nasopharyngeal swab, presence of viral mutation(s) within the areas targeted by this assay, and inadequate number of viral copies(<138 copies/mL). A negative result must be combined with clinical observations, patient history, and epidemiological information. The expected result is Negative.  Fact Sheet for Patients:  BloggerCourse.com  Fact Sheet for Healthcare Providers:  SeriousBroker.it  This test is no t yet approved or cleared by the [place] FDA and  has been  authorized for detection and/or diagnosis of SARS-CoV-2 by FDA under an Emergency Use Authorization (EUA). This EUA will remain  in effect (meaning this test can be used) for the duration of the COVID-19 declaration under Section 564(b)(1) of the Act, 21 U.S.C.section 360bbb-3(b)(1), unless the authorization is terminated  or revoked sooner.       Influenza A by PCR NEGATIVE NEGATIVE   Influenza B by PCR NEGATIVE NEGATIVE    Comment: (NOTE) The Xpert Xpress SARS-CoV-2/FLU/RSV plus assay is intended as an aid in the diagnosis of influenza from Nasopharyngeal swab specimens and should not be used as a sole basis for treatment. Nasal washings and aspirates are unacceptable for Xpert Xpress SARS-CoV-2/FLU/RSV testing.  Fact Sheet for Patients: BloggerCourse.com  Fact Sheet for Healthcare Providers: SeriousBroker.it  This test is not yet approved or cleared by the [place] FDA and has been authorized for detection and/or diagnosis of SARS-CoV-2 by FDA under an Emergency Use Authorization (EUA). This EUA will remain in effect (meaning this test can be used) for the duration of the COVID-19 declaration under Section 564(b)(1) of the Act, 21 U.S.C. section 360bbb-3(b)(1), unless the authorization is terminated or revoked.  Performed at [facility], [address], [place], [place] [address]   Comprehensive metabolic panel     Status: Abnormal   Collection Time: 11/22/21  8:19 PM  Result Value Ref Range   Sodium 142 135 - 145 mmol/L   Potassium 3.9 3.5 - 5.1 mmol/L   Chloride 109 98 - 111 mmol/L   CO2 26 22 - 32 mmol/L   Glucose, Bld 84 70 - 99 mg/dL    Comment: Glucose reference range applies only to samples taken after fasting for at least 8 hours.   BUN 10 6 - 20 mg/dL   Creatinine, Ser 4.40 0.61 - 1.24 mg/dL   Calcium 8.6 (L) 8.9 - 10.3 mg/dL  Total Protein 5.8 (L) 6.5 - 8.1 g/dL   Albumin 3.7 3.5 - 5.0 g/dL    AST 19 15 - 41 U/L   ALT 22 0 - 44 U/L   Alkaline Phosphatase 49 38 - 126 U/L   Total Bilirubin 0.6 0.3 - 1.2 mg/dL   GFR, Estimated >16 >10 mL/min    Comment: (NOTE) Calculated using the CKD-EPI Creatinine Equation (2021)    Anion gap 7 5 - 15    Comment: Performed at [facility], [address], [place], [place] [address]  Ethanol     Status: None   Collection Time: 11/22/21  8:19 PM  Result Value Ref Range   Alcohol, Ethyl (B) <10 <10 mg/dL    Comment: (NOTE) Lowest detectable limit for serum alcohol is 10 mg/dL.  For medical purposes only. Performed at [facility], [address] [place], [place] [address]   CBC with Diff     Status: Abnormal   Collection Time: 11/22/21  8:19 PM  Result Value Ref Range   WBC 11.9 (H) 4.0 - 10.5 K/uL   RBC 4.63 4.22 - 5.81 MIL/uL   Hemoglobin 13.7 13.0 - 17.0 g/dL   HCT 19.1 47.8 - 29.5 %   MCV 92.2 80.0 - 100.0 fL   MCH 29.6 26.0 - 34.0 pg   MCHC 32.1 30.0 - 36.0 g/dL   RDW 62.1 30.8 - 65.7 %   Platelets 224 150 - 400 K/uL   nRBC 0.0 0.0 - 0.2 %   Neutrophils Relative % 64 %   Neutro Abs 7.6 1.7 - 7.7 K/uL   Lymphocytes Relative 29 %   Lymphs Abs 3.4 0.7 - 4.0 K/uL   Monocytes Relative 5 %   Monocytes Absolute 0.6 0.1 - 1.0 K/uL   Eosinophils Relative 1 %   Eosinophils Absolute 0.2 0.0 - 0.5 K/uL   Basophils Relative 0 %   Basophils Absolute 0.0 0.0 - 0.1 K/uL   Immature Granulocytes 1 %   Abs Immature Granulocytes 0.07 0.00 - 0.07 K/uL    Comment: Performed at [facility], [address] [place], [place] [address]  Salicylate level     Status: Abnormal   Collection Time: 11/22/21  8:19 PM  Result Value Ref Range   Salicylate Lvl <7.0 (L) 7.0 - 30.0 mg/dL    Comment: Performed at [facility], [address], [place], [place] [address]  Acetaminophen level     Status: Abnormal   Collection Time: 11/22/21  8:19 PM  Result Value Ref Range   Acetaminophen (Tylenol), Serum <10 (L) 10 - 30 ug/mL     Comment: (NOTE) Therapeutic concentrations vary significantly. A range of 10-30 ug/mL  may be an effective concentration for many patients. However, some  are best treated at concentrations outside of this range. Acetaminophen concentrations >150 ug/mL at 4 hours after ingestion  and >50 ug/mL at 12 hours after ingestion are often associated with  toxic reactions.  Performed at [facility], [address], [place], [place] [address]   Magnesium     Status: None   Collection Time: 11/22/21  8:19 PM  Result Value Ref Range   Magnesium 2.1 1.7 - 2.4 mg/dL    Comment: Performed at [facility], [address], [place], [place] [address]  Acetaminophen level     Status: Abnormal   Collection Time: 11/22/21 10:21 PM  Result Value Ref Range   Acetaminophen (Tylenol),  Serum <10 (L) 10 - 30 ug/mL    Comment: Performed at [facility], [address], [place], [place] [address]    Medications:  Current Facility-Administered Medications  Medication Dose Route Frequency Provider Last Rate Last Admin   cloNIDine (CATAPRES) tablet 0.1 mg  0.1 mg Oral Q12H White, Patrice L, NP   0.1 mg at 11/24/21 0954   DULoxetine (CYMBALTA) DR capsule 60 mg  60 mg Oral Daily White, Patrice L, NP   60 mg at 11/24/21 0954   hydrOXYzine (ATARAX) tablet 25 mg  25 mg Oral TID PRN White, Patrice L, NP       QUEtiapine (SEROQUEL) tablet 200 mg  200 mg Oral QHS White, Patrice L, NP   200 mg at 11/23/21 2109   traZODone (DESYREL) tablet 50 mg  50 mg Oral QHS PRN White, Patrice L, NP   50 mg at 11/23/21 2108   Current Outpatient Medications  Medication Sig Dispense Refill   busPIRone (BUSPAR) 15 MG tablet Take 1 tablet (15 mg total) by mouth 2 (two) times daily. 60 tablet 0   cloNIDine (CATAPRES) 0.1 MG tablet Take 1 tablet (0.1 mg total) by mouth every 12 (twelve) hours. 60 tablet 0   DULoxetine (CYMBALTA) 60 MG capsule Take 1 capsule (60 mg total) by mouth daily. 30 capsule 0   hydrOXYzine (ATARAX)  25 MG tablet Take 1 tablet (25 mg total) by mouth 3 (three) times daily as needed for anxiety. 30 tablet 0   neomycin-bacitracin-polymyxin [phone] OINT Apply 1 application. topically daily as needed (apply to excoriation).  0   QUEtiapine (SEROQUEL) 200 MG tablet Take 1 tablet (200 mg total) by mouth 2 (two) times daily. 60 tablet 0   QUEtiapine (SEROQUEL) 400 MG tablet Take 1 tablet (400 mg total) by mouth at bedtime. 30 tablet 0   traZODone (DESYREL) 50 MG tablet Take 1 tablet (50 mg total) by mouth at bedtime as needed for sleep. 30 tablet 0   nicotine (NICODERM CQ - DOSED IN MG/24 HOURS) 21 mg/24hr patch Place 1 patch (21 mg total) onto the skin daily for 28 days. (Patient not taking: Reported on 11/22/2021) 28 patch 0    Musculoskeletal: Strength & Muscle Tone: within normal limits Gait & Station: normal Patient leans: N/A   Psychiatric Specialty Exam:  Presentation  General Appearance: Appropriate for Environment  Eye Contact:Good  Speech:Clear and Coherent; Normal Rate  Speech Volume:Normal  Handedness:Right   Mood and Affect  Mood:Anxious; Depressed  Affect:Congruent   Thought Process  Thought Processes:Coherent  Descriptions of Associations:Circumstantial  Orientation:Full (Time, Place and Person)  Thought Content:Logical; Paranoid Ideation  History of Schizophrenia/Schizoaffective disorder:No  Duration of Psychotic Symptoms:N/A  Hallucinations:Hallucinations: Auditory; Visual Description of Auditory Hallucinations: male voice "He is tells me to do things.  He tells me to cut myself; he tells me to do inapproprate stuff like touch people and kill myself" Description of Visual Hallucinations: Report he sees a male but no discription given  Ideas of Reference:Paranoia  Suicidal Thoughts:Suicidal Thoughts: Yes, Active SI Active Intent and/or Plan: Without Intent; With Plan  Homicidal Thoughts:Homicidal Thoughts: No   Sensorium  Memory:Immediate  Good; Recent Good  Judgment:Poor  Insight:Lacking   Executive Functions  Concentration:Fair  Attention Span:Fair  Recall:Fair  Fund of Knowledge:Good  Language:Good   Psychomotor Activity  Psychomotor Activity:Psychomotor Activity: Restlessness   Assets  Assets:Communication Skills; Leisure Time   Sleep  Sleep:Sleep: Fair    Physical Exam: Physical Exam Vitals and nursing note  reviewed. Exam conducted with a chaperone present.  Constitutional:      General: He is not in acute distress.    Appearance: Normal appearance. He is not ill-appearing.  Cardiovascular:     Rate and Rhythm: Normal rate.  Pulmonary:     Effort: Pulmonary effort is normal.  Neurological:     Mental Status: He is alert and oriented to person, place, and time.  Psychiatric:        Attention and Perception: He perceives auditory and visual hallucinations.        Mood and Affect: Mood is anxious and depressed.        Speech: Speech normal.        Behavior: Behavior is cooperative.        Thought Content: Thought content is paranoid. Thought content includes suicidal ideation. Thought content does not include homicidal ideation.        Judgment: Judgment is impulsive.    Review of Systems  Constitutional: Negative.   HENT: Negative.    Eyes: Negative.   Respiratory: Negative.    Cardiovascular: Negative.   Gastrointestinal: Negative.   Genitourinary: Negative.   Musculoskeletal: Negative.   Skin: Negative.   Neurological: Negative.   Endo/Heme/Allergies: Negative.   Psychiatric/Behavioral:  Positive for depression, hallucinations and suicidal ideas. The patient is nervous/anxious and has insomnia.    Blood pressure 117/76, pulse 77, temperature 97.8 F (36.6 C), temperature source Oral, resp. rate 17, SpO2 96 %. There is no height or weight on file to calculate BMI.  Treatment Plan Summary: Daily contact with patient to assess and evaluate symptoms and progress in treatment,  Medication management, and Plan Inpatient psychiatric treatment   cloNIDine  0.1 mg Oral Q12H   DULoxetine  60 mg Oral Daily   QUEtiapine  100 mg Oral BID   QUEtiapine  200 mg Oral QHS    Buspar not restarted.  Patient requesting that his Seroquel be restarted.  Changed to Seroquel 100 mg Bid and continued 200 mg at Q hs  Disposition: Recommend psychiatric Inpatient admission when medically cleared.  This service was provided via telemedicine using a 2-way, interactive audio and video technology.  Names of all persons participating in this telemedicine service and their role in this encounter. Name: Assunta Found Role: NP  Name: Jon Clayton Role: Patient  Name:  Role:   Name:  Role:    Secure message sent to patients nurse and social work/TOC informing:  Psychiatric consult completed and patient recommend for inpatient psychiatric treatment.  If no appropriated bed at [facility] patient is to be faxed out.  Patient should be appropriate for mood disorder bed.  Added Seroquel 100 mg Bid and continued 200 mg Q hs.  No other medication changes made at this time.     Ruhi Kopke, NP 11/24/2021 11:38 AM

## 2021-11-24 NOTE — ED Notes (Signed)
Per Elonda Husky with [facility] Clinical intake department: patient has been accepted and can arrive after 8am 11/24/21 Accepting Dr. Andria Meuse 2W building  Report #: [phone] or [phone]  (SW has been sent message to contact [facility] intake department regarding Clinical notes requested)

## 2021-11-24 NOTE — ED Notes (Signed)
Lunch order placed

## 2021-11-24 NOTE — ED Notes (Signed)
Cassandra with [facility] called back to rescind bed due to patient's registered sex offender status; Secure chat sent to SW advising patient still needs placement-Monique,RN

## 2021-11-24 NOTE — ED Notes (Signed)
Patient states he is feeling anxious and would like something to help him sleep. Trazadone offered. Patient states he would like this medication

## 2021-11-24 NOTE — ED Notes (Signed)
Pt is asking about his normal doses of Seroquel.  I have reached out to Psych NP about a plan for returning him to his normal doses bid and at bedtime.

## 2021-11-24 NOTE — ED Notes (Signed)
Pt's dinner tray has been delivered. 

## 2021-11-24 NOTE — Progress Notes (Signed)
Patient has been denied by [facility] due to no appropriate beds available. Patient meets BH inpatient criteria per Liborio Nixon, NP. Patient has been faxed out to the following facilities:   [facility]  [address] [place], [place] [address] [phone]  [facility]  [address] [place] [address] [phone]  [facility]  [address] [place] [address] [phone]  [facility]  450 092 [address] [place] [place] [address] [phone]  [facility]  [address] [place] [address] [phone]  [facility]  [address] [place] [address] [phone]  [facility]  [address], [place] [address] [phone]  [facility]  [address] [place] [address] [phone]  [facility]  [address], [place] [address] [phone]  [facility]  [address] [place] [address] [phone]  [facility]  [address] [place] [address] [phone]  [facility]  [address] [place], [place] [address] [phone]  [facility]  [address] [place] [address] [phone]   Damita Dunnings, MSW, LCSW-A  1:42 PM 11/24/2021

## 2021-11-24 NOTE — ED Notes (Signed)
Pt updated on plan for Seroquel administration moving forward.

## 2021-11-24 NOTE — ED Provider Notes (Signed)
Emergency Medicine Observation Re-evaluation Note  Jon Clayton is a 26 y.o. male, seen on rounds today.  Pt initially presented to the ED for complaints of No chief complaint on file. Currently, the patient is sitting in bed.  Physical Exam  BP (!) 94/55 (BP Location: Left Arm)   Pulse (!) 55   Temp 97.9 F (36.6 C) (Oral)   Resp 16   SpO2 97%  Physical Exam General: Awake, alert, nondistressed Cardiac: Extremities well-perfused Lungs: Breathing is unlabored Psych: No agitation, not responding to internal stimuli  ED Course / MDM  EKG:EKG Interpretation  Date/Time:  Monday November 22 2021 20:14:55 EDT Ventricular Rate:  78 PR Interval:  150 QRS Duration: 86 QT Interval:  388 QTC Calculation: 442 R Axis:   48 Text Interpretation: Normal sinus rhythm Normal ECG When compared with ECG of 09-Nov-2021 14:18, PREVIOUS ECG IS PRESENT Confirmed by Kristine Royal [phone]) on 11/22/2021 9:43:38 PM  I have reviewed the labs performed to date as well as medications administered while in observation.  Recent changes in the last 24 hours include initially accepted to [facility] but bed at [facility] rescinded due to sex offender status.  Plan  Current plan is for psychiatric admission. Jon Clayton is under involuntary commitment.      Gloris Manchester, MD 11/24/21 [phone]

## 2021-11-25 ENCOUNTER — Inpatient Hospital Stay (HOSPITAL_COMMUNITY)
Admission: AD | Admit: 2021-11-25 | Discharge: 2021-12-15 | DRG: 885 | Disposition: A | Discharge status: Home / Self Care | Payer: Medicaid Other | Source: Intra-hospital | Attending: Psychiatry | Admitting: Psychiatry

## 2021-11-25 ENCOUNTER — Encounter (HOSPITAL_COMMUNITY): Payer: Self-pay | Admitting: Behavioral Health

## 2021-11-25 ENCOUNTER — Other Ambulatory Visit: Payer: Self-pay

## 2021-11-25 DIAGNOSIS — Z72811 Adult antisocial behavior: Secondary | ICD-10-CM | POA: Diagnosis not present

## 2021-11-25 DIAGNOSIS — Z20822 Contact with and (suspected) exposure to covid-19: Secondary | ICD-10-CM | POA: Diagnosis present

## 2021-11-25 DIAGNOSIS — Z91410 Personal history of adult physical and sexual abuse: Secondary | ICD-10-CM | POA: Diagnosis not present

## 2021-11-25 DIAGNOSIS — F333 Major depressive disorder, recurrent, severe with psychotic symptoms: Principal | ICD-10-CM | POA: Diagnosis present

## 2021-11-25 DIAGNOSIS — Z888 Allergy status to other drugs, medicaments and biological substances status: Secondary | ICD-10-CM | POA: Diagnosis not present

## 2021-11-25 DIAGNOSIS — Z9189 Other specified personal risk factors, not elsewhere classified: Secondary | ICD-10-CM

## 2021-11-25 DIAGNOSIS — F4310 Post-traumatic stress disorder, unspecified: Secondary | ICD-10-CM | POA: Diagnosis not present

## 2021-11-25 DIAGNOSIS — Z818 Family history of other mental and behavioral disorders: Secondary | ICD-10-CM

## 2021-11-25 DIAGNOSIS — Z9152 Personal history of nonsuicidal self-harm: Secondary | ICD-10-CM

## 2021-11-25 DIAGNOSIS — F1290 Cannabis use, unspecified, uncomplicated: Secondary | ICD-10-CM | POA: Diagnosis present

## 2021-11-25 DIAGNOSIS — F449 Dissociative and conversion disorder, unspecified: Secondary | ICD-10-CM | POA: Diagnosis not present

## 2021-11-25 DIAGNOSIS — F17210 Nicotine dependence, cigarettes, uncomplicated: Secondary | ICD-10-CM | POA: Diagnosis present

## 2021-11-25 DIAGNOSIS — F609 Personality disorder, unspecified: Secondary | ICD-10-CM | POA: Diagnosis present

## 2021-11-25 DIAGNOSIS — F064 Anxiety disorder due to known physiological condition: Secondary | ICD-10-CM | POA: Diagnosis present

## 2021-11-25 DIAGNOSIS — R45851 Suicidal ideations: Secondary | ICD-10-CM | POA: Diagnosis not present

## 2021-11-25 DIAGNOSIS — M797 Fibromyalgia: Secondary | ICD-10-CM | POA: Diagnosis present

## 2021-11-25 DIAGNOSIS — F1210 Cannabis abuse, uncomplicated: Secondary | ICD-10-CM | POA: Diagnosis present

## 2021-11-25 DIAGNOSIS — Z91148 Patient's other noncompliance with medication regimen for other reason: Secondary | ICD-10-CM | POA: Diagnosis not present

## 2021-11-25 DIAGNOSIS — R4589 Other symptoms and signs involving emotional state: Secondary | ICD-10-CM | POA: Diagnosis present

## 2021-11-25 DIAGNOSIS — Z9151 Personal history of suicidal behavior: Secondary | ICD-10-CM | POA: Diagnosis not present

## 2021-11-25 DIAGNOSIS — Z560 Unemployment, unspecified: Secondary | ICD-10-CM

## 2021-11-25 DIAGNOSIS — R45850 Homicidal ideations: Secondary | ICD-10-CM | POA: Diagnosis not present

## 2021-11-25 DIAGNOSIS — Z880 Allergy status to penicillin: Secondary | ICD-10-CM | POA: Diagnosis not present

## 2021-11-25 LAB — RESP PANEL BY RT-PCR (FLU A&B, COVID) ARPGX2
Influenza A by PCR: NEGATIVE
Influenza B by PCR: NEGATIVE
SARS Coronavirus 2 by RT PCR: NEGATIVE

## 2021-11-25 MED ORDER — DULOXETINE HCL 60 MG PO CPEP
60.0000 mg | ORAL_CAPSULE | Freq: Every day | ORAL | Status: DC
  Filled 2021-11-25 (×3): qty 1

## 2021-11-25 MED ORDER — QUETIAPINE FUMARATE 100 MG PO TABS
100.0000 mg | ORAL_TABLET | Freq: Two times a day (BID) | ORAL | Status: DC
  Administered 2021-11-25 – 2021-11-26 (×2): 100 mg via ORAL
  Filled 2021-11-25 (×7): qty 1

## 2021-11-25 MED ORDER — ALUM & MAG HYDROXIDE-SIMETH 200-200-20 MG/5ML PO SUSP: 30.0000 mL | ORAL | Status: DC | PRN

## 2021-11-25 MED ORDER — CLONIDINE HCL 0.1 MG PO TABS
0.1000 mg | ORAL_TABLET | Freq: Two times a day (BID) | ORAL | Status: DC
  Administered 2021-11-25 – 2021-12-15 (×38): 0.1 mg via ORAL
  Filled 2021-11-25 (×46): qty 1

## 2021-11-25 MED ORDER — QUETIAPINE FUMARATE 200 MG PO TABS
200.0000 mg | ORAL_TABLET | Freq: Every day | ORAL | Status: DC
  Administered 2021-11-25: 200 mg via ORAL
  Filled 2021-11-25 (×4): qty 1

## 2021-11-25 MED ORDER — TRAZODONE HCL 50 MG PO TABS
50.0000 mg | ORAL_TABLET | Freq: Every evening | ORAL | Status: DC | PRN
  Administered 2021-11-25: 50 mg via ORAL
  Filled 2021-11-25: qty 1

## 2021-11-25 MED ORDER — ACETAMINOPHEN 325 MG PO TABS
650.0000 mg | ORAL_TABLET | Freq: Four times a day (QID) | ORAL | Status: DC | PRN
  Administered 2021-11-26 – 2021-12-09 (×4): 650 mg via ORAL
  Filled 2021-11-25 (×4): qty 2

## 2021-11-25 MED ORDER — HYDROXYZINE HCL 25 MG PO TABS
25.0000 mg | ORAL_TABLET | Freq: Three times a day (TID) | ORAL | Status: DC | PRN
  Administered 2021-11-25 – 2021-12-15 (×22): 25 mg via ORAL
  Filled 2021-11-25 (×23): qty 1

## 2021-11-25 MED ORDER — MAGNESIUM HYDROXIDE 400 MG/5ML PO SUSP: 30.0000 mL | Freq: Every day | ORAL | Status: DC | PRN

## 2021-11-25 MED ORDER — NICOTINE 14 MG/24HR TD PT24
14.0000 mg | MEDICATED_PATCH | Freq: Every day | TRANSDERMAL | Status: DC
  Administered 2021-11-26 – 2021-12-14 (×18): 14 mg via TRANSDERMAL
  Filled 2021-11-25 (×22): qty 1

## 2021-11-25 NOTE — ED Notes (Signed)
Patient lying on his side and sleeping during VS being taken

## 2021-11-25 NOTE — ED Provider Notes (Signed)
Emergency Medicine Observation Re-evaluation Note  Jon Clayton is a 26 y.o. male, seen on rounds today.  Pt initially presented to the ED for complaints of No chief complaint on file. Currently, the patient is eating breakfast.  Physical Exam  BP (!) 95/57 (BP Location: Right Arm)   Pulse 62   Temp 97.6 F (36.4 C) (Oral)   Resp 16   SpO2 95%  Physical Exam General: No distress Cardiac: Well-perfused Lungs: No respiratory distress Psych: Deferred  ED Course / MDM  EKG:EKG Interpretation  Date/Time:  Monday November 22 2021 20:14:55 EDT Ventricular Rate:  78 PR Interval:  150 QRS Duration: 86 QT Interval:  388 QTC Calculation: 442 R Axis:   48 Text Interpretation: Normal sinus rhythm Normal ECG When compared with ECG of 09-Nov-2021 14:18, PREVIOUS ECG IS PRESENT Confirmed by Kristine Royal [phone]) on 11/22/2021 9:43:38 PM  I have reviewed the labs performed to date as well as medications administered while in observation.  Recent changes in the last 24 hours include none.  Plan  Current plan is for awaiting psychiatric placement. Jon Clayton is under involuntary commitment.      Glynn Octave, MD 11/25/21 [phone]

## 2021-11-25 NOTE — ED Notes (Signed)
Voicemail left for safe transport

## 2021-11-25 NOTE — Progress Notes (Signed)
Patient is 26 yrs old, involuntary, was discharged from [facility] last week.  Patient stated he went to [facility] for appointment and people stated they could not talk to him that day, that he had to come back the next week.  This upset him.  [name] told him to kill himself and others.  Patient stated he took a handful  of pills (buspar) that he got from [facility] when he was discharged.    Patient stated he has no home, no money, no food, did not know what to do.  [name] screamed at patient and told him to kill himself.  Has cut marks on L arm with a "throwing knife".  L arm he scraped with a piece of metal last week while at [facility].  Red area on L arm, he was scratching to get to a crawling bug.  From [facility] was sent to another hospital who "screwed up his medicines".   Stated he does not remember the last time he used THC.  Has been using THCA (legal pot).  Does not have any disability income.  Has been staying in [facility], does not think he can return there.  [name] continues to say "kill yourself."   Never married, no children.   SI, does have Si thoughts, no plan here at [facility], contracts for safety.  HI not now.  Did have HI thoughts while at [facility] when he could not be seen.  [name] tells him to hurt myself and to take things apart.  Rated danxiety 6, depression 11, hopeless 10.  Only has one friend, no family to help him.  Has food stamps.   Probation Officer is Ms Olive Bass, [place] phone [phone]. Has one friend, Dwaine Gale phone [phone], cell phone.   Fall risk information given and discussed with patient, low fall risk. Patient has been cooperative.  Patient given food/drink.  Patient did not want to discuss drug use.  Stated [facility] is legal.

## 2021-11-25 NOTE — ED Notes (Signed)
Pt belongings found in big locker 3.

## 2021-11-25 NOTE — Progress Notes (Signed)
   11/25/21 2000  Psych Admission Type (Psych Patients Only)  Admission Status Involuntary  Psychosocial Assessment  Patient Complaints Self-harm thoughts  Eye Contact Brief  Facial Expression Anxious  Affect Anxious  Speech Logical/coherent  Interaction Assertive  Motor Activity Fidgety  Appearance/Hygiene Disheveled  Behavior Characteristics Cooperative  Mood Anxious  Aggressive Behavior  Effect No apparent injury  Thought Process  Coherency WDL  Content WDL  Delusions Paranoid  Perception Hallucinations  Hallucination Auditory  Judgment Impaired  Confusion None  Danger to Self  Current suicidal ideation? Passive  Self-Injurious Behavior Some self-injurious ideation observed or expressed.  No lethal plan expressed   Agreement Not to Harm Self Yes  Description of Agreement verbal contract for safety

## 2021-11-25 NOTE — [facility] Group Notes (Signed)
Adult Psychoeducational Group Note  Date:  11/25/2021 Time:  8:49 PM  Group Topic/Focus:  Wrap-Up Group:   The focus of this group is to help patients review their daily goal of treatment and discuss progress on daily workbooks.  Participation Level:  Active  Participation Quality:  Appropriate  Affect:  Appropriate  Cognitive:  Appropriate  Insight: Appropriate  Engagement in Group:  Engaged  Modes of Intervention:  Discussion  Additional Comments:  Patient attended and participated in the Wrap-up group.  Jearl Klinefelter 11/25/2021, 8:49 PM

## 2021-11-25 NOTE — Tx Team (Signed)
Initial Treatment Plan 11/25/2021 6:33 PM [name] HCW:[id number]    PATIENT STRESSORS: Financial difficulties   Legal issue   Medication change or noncompliance     PATIENT STRENGTHS: Ability for insight  Capable of independent living  Physical Health    PATIENT IDENTIFIED PROBLEMS: "To find a place"  "Not to go to jail"  Suicidal ideation  Depression  Anxiety  Auditory hallucination           DISCHARGE CRITERIA:  Ability to meet basic life and health needs Safe-care adequate arrangements made  PRELIMINARY DISCHARGE PLAN: Attend aftercare/continuing care group Outpatient therapy Placement in alternative living arrangements  PATIENT/FAMILY INVOLVEMENT: This treatment plan has been presented to and reviewed with the patient, Jon Clayton, and/or family member.  The patient and family have been given the opportunity to ask questions and make suggestions.  Clarene Critchley, RN 11/25/2021, 6:33 PM

## 2021-11-25 NOTE — Progress Notes (Addendum)
Pt was accepted to [facility] 11/25/21 PENDING Labs; Bed Assignment 502-1  Dx: MDD with psychosis.   Pt meets inpatient criteria per Liborio Nixon, NP  Attending Physician will be Dr. Loleta Chance  Report can be called to:Adult unit: [phone]   IVC faxed to [phone].   Pt can arrive after: Unit will coordinate arrival   Care Tam notified: [facility] Rona Ravens, RN, Roddie Mc, RN, Letta Pate, Liborio Nixon, NP, and Gretta Arab, RN.   Kelton Pillar, LCSWA 11/25/2021 @ 12:15 PM

## 2021-11-25 NOTE — Group Note (Signed)
Occupational Therapy Group Note   Group Topic:Goal Setting  Group Date: 11/25/2021 Start Time: 1400 End Time: 1445 Facilitators: Ted Mcalpine, OT   Group Description: Group encouraged engagement and participation through discussion focused on goal setting. Group members were introduced to goal-setting using the SMART Goal framework, identifying goals as Specific, Measureable, Acheivable, Relevant, and Time-Bound. Group members took time from group to create their own personal goal reflecting the SMART goal template and shared for review by peers and OT.    In this group therapy session, we will delve into the power of routines and how they can positively impact our mental health and wellbeing. We will explore the concept of approaching routines from a systems and goals perspective, recognizing the interconnectedness of various aspects of our lives and the importance of setting meaningful goals. Together, we will discuss practical ideas and strategies for implementing effective routines in the inpatient setting, as well as maintaining them when transitioning back home and into the community. Through open and supportive dialogue, we will share personal experiences, challenges, and successes, allowing each participant to gain valuable insights and tools for fostering positive change in their lives. This discussion aims to empower and inspire individuals to embrace routines as a pathway to improved mental health and overall wellbeing.   Therapeutic Goal(s):  Identify at least one goal that fits the SMART framework    Participation Level: Minimal   Participation Quality: Minimal Cues   Behavior: Calm   Speech/Thought Process: Focused   Affect/Mood: Appropriate   Insight: Fair   Judgement: Fair   Individualization: pt was active but passive  in their participation of group discussion/activity. New skills were identified  Modes of Intervention: Discussion and Education  Patient Response  to Interventions:  Attentive   Plan: Continue to engage patient in OT groups 2 - 3x/week.  11/25/2021  Ted Mcalpine, OT Kerrin Champagne, OT

## 2021-11-25 NOTE — ED Notes (Signed)
Patient reports some anxiety today.  Patient observed pacing in his room.

## 2021-11-26 ENCOUNTER — Encounter (HOSPITAL_COMMUNITY): Payer: Self-pay

## 2021-11-26 DIAGNOSIS — F1210 Cannabis abuse, uncomplicated: Secondary | ICD-10-CM | POA: Diagnosis present

## 2021-11-26 DIAGNOSIS — F1290 Cannabis use, unspecified, uncomplicated: Secondary | ICD-10-CM | POA: Diagnosis present

## 2021-11-26 DIAGNOSIS — F333 Major depressive disorder, recurrent, severe with psychotic symptoms: Secondary | ICD-10-CM | POA: Diagnosis not present

## 2021-11-26 MED ORDER — OLANZAPINE 10 MG IM SOLR
5.0000 mg | Freq: Four times a day (QID) | INTRAMUSCULAR | Status: DC | PRN
Start: 2021-11-26 — End: 2021-11-27

## 2021-11-26 MED ORDER — BACITRACIN-NEOMYCIN-POLYMYXIN OINTMENT TUBE
TOPICAL_OINTMENT | Freq: Two times a day (BID) | CUTANEOUS | Status: DC
  Administered 2021-11-29 – 2021-12-15 (×6): 1 via TOPICAL
  Filled 2021-11-26 (×2): qty 14.17

## 2021-11-26 MED ORDER — ONDANSETRON 4 MG PO TBDP: 4.0000 mg | ORAL_TABLET | Freq: Three times a day (TID) | ORAL | Status: DC | PRN

## 2021-11-26 MED ORDER — AMITRIPTYLINE HCL 25 MG PO TABS
25.0000 mg | ORAL_TABLET | Freq: Every day | ORAL | Status: DC
  Administered 2021-11-26 – 2021-12-14 (×18): 25 mg via ORAL
  Filled 2021-11-26 (×21): qty 1

## 2021-11-26 MED ORDER — OLANZAPINE 5 MG PO TBDP
5.0000 mg | ORAL_TABLET | Freq: Every day | ORAL | Status: DC
  Administered 2021-11-26: 5 mg via ORAL
  Filled 2021-11-26 (×2): qty 1

## 2021-11-26 MED ORDER — LORAZEPAM 1 MG PO TABS
1.0000 mg | ORAL_TABLET | Freq: Once | ORAL | Status: AC
  Administered 2021-11-26: 1 mg via ORAL
  Filled 2021-11-26: qty 1

## 2021-11-26 MED ORDER — OLANZAPINE 5 MG PO TBDP: 5.0000 mg | ORAL_TABLET | Freq: Four times a day (QID) | ORAL | Status: DC | PRN

## 2021-11-26 MED ORDER — MIRTAZAPINE 7.5 MG PO TABS
7.5000 mg | ORAL_TABLET | Freq: Every evening | ORAL | Status: DC | PRN
  Administered 2021-11-29: 7.5 mg via ORAL
  Filled 2021-11-26 (×3): qty 1

## 2021-11-26 MED ORDER — MELATONIN 5 MG PO TABS
5.0000 mg | ORAL_TABLET | Freq: Every day | ORAL | Status: DC
  Administered 2021-11-26 – 2021-12-14 (×18): 5 mg via ORAL
  Filled 2021-11-26 (×21): qty 1

## 2021-11-26 NOTE — Progress Notes (Signed)
1:1 Observation  During q 15 minutes safety checks Patient gave MHT a small piece of hard  plastic from the a/c unit and showed MHT. superficial sketches on his Left forearm. Patient stated he is not feeling safe and does not want to be here. Patient stated he wanted to drink cleaning liquid earlier during the day but did not do it. Patient is flat and guarded, denies A/VH.   Pt stated on 1:1 Observation for self injurious behavior and safety. Provider notified and Press photographer. Patient assessed by provider. OTO of ativan given.   Support and encouragement provided.

## 2021-11-26 NOTE — Progress Notes (Signed)
   11/26/21 0500  Sleep  Number of Hours 7.5    

## 2021-11-26 NOTE — BH IP Treatment Plan (Signed)
Interdisciplinary Treatment and Diagnostic Plan Update  11/26/2021 Time of Session: 10:30am Jon Clayton MRN: [id number]  Principal Diagnosis: MDD (major depressive disorder), recurrent, severe, with psychosis ([place])  Secondary Diagnoses: Principal Problem:   MDD (major depressive disorder), recurrent, severe, with psychosis ([place]) Active Problems:   PTSD (post-traumatic stress disorder)   Cannabis use disorder   Current Medications:  Current Facility-Administered Medications  Medication Dose Route Frequency Provider Last Rate Last Admin   acetaminophen (TYLENOL) tablet 650 mg  650 mg Oral Q6H PRN White, Patrice L, NP   650 mg at 11/26/21 0820   alum & mag hydroxide-simeth (MAALOX/MYLANTA) 200-200-20 MG/5ML suspension 30 mL  30 mL Oral Q4H PRN White, Patrice L, NP       amitriptyline (ELAVIL) tablet 25 mg  25 mg Oral QHS Hill, Jackie Plum, MD       cloNIDine (CATAPRES) tablet 0.1 mg  0.1 mg Oral Q12H White, Patrice L, NP   0.1 mg at 11/26/21 0818   hydrOXYzine (ATARAX) tablet 25 mg  25 mg Oral TID PRN White, Patrice L, NP   25 mg at 11/26/21 1239   magnesium hydroxide (MILK OF MAGNESIA) suspension 30 mL  30 mL Oral Daily PRN White, Patrice L, NP       melatonin tablet 5 mg  5 mg Oral QHS Hill, Jackie Plum, MD       mirtazapine (REMERON) tablet 7.5 mg  7.5 mg Oral QHS PRN Hill, Jackie Plum, MD       nicotine (NICODERM CQ - dosed in mg/24 hours) patch 14 mg  14 mg Transdermal Daily Hill, Jackie Plum, MD   14 mg at 11/26/21 0819   OLANZapine zydis (ZYPREXA) disintegrating tablet 5 mg  5 mg Oral Q6H PRN Maida Sale, MD       Or   OLANZapine (ZYPREXA) injection 5 mg  5 mg Intramuscular Q6H PRN Hill, Jackie Plum, MD       OLANZapine zydis (ZYPREXA) disintegrating tablet 5 mg  5 mg Oral QHS Hill, Jackie Plum, MD       ondansetron (ZOFRAN-ODT) disintegrating tablet 4 mg  4 mg Oral Q8H PRN Hill, Jackie Plum, MD       PTA Medications: Medications Prior to  Admission  Medication Sig Dispense Refill Last Dose   busPIRone (BUSPAR) 15 MG tablet Take 1 tablet (15 mg total) by mouth 2 (two) times daily. 60 tablet 0    cloNIDine (CATAPRES) 0.1 MG tablet Take 1 tablet (0.1 mg total) by mouth every 12 (twelve) hours. 60 tablet 0    DULoxetine (CYMBALTA) 60 MG capsule Take 1 capsule (60 mg total) by mouth daily. 30 capsule 0    hydrOXYzine (ATARAX) 25 MG tablet Take 1 tablet (25 mg total) by mouth 3 (three) times daily as needed for anxiety. 30 tablet 0    neomycin-bacitracin-polymyxin [phone] OINT Apply 1 application. topically daily as needed (apply to excoriation).  0    nicotine (NICODERM CQ - DOSED IN MG/24 HOURS) 21 mg/24hr patch Place 1 patch (21 mg total) onto the skin daily for 28 days. (Patient not taking: Reported on 11/22/2021) 28 patch 0    QUEtiapine (SEROQUEL) 200 MG tablet Take 1 tablet (200 mg total) by mouth 2 (two) times daily. 60 tablet 0    QUEtiapine (SEROQUEL) 400 MG tablet Take 1 tablet (400 mg total) by mouth at bedtime. 30 tablet 0    traZODone (DESYREL) 50 MG tablet Take 1 tablet (50 mg total) by mouth at bedtime  as needed for sleep. 30 tablet 0     Patient Stressors: Soil scientist issue   Medication change or noncompliance    Patient Strengths: Ability for insight  Capable of independent living  Physical Health   Treatment Modalities: Medication Management, Group therapy, Case management,  1 to 1 session with clinician, Psychoeducation, Recreational therapy.   Physician Treatment Plan for Primary Diagnosis: MDD (major depressive disorder), recurrent, severe, with psychosis ([place]) Long Term Goal(s):     Short Term Goals:    Medication Management: Evaluate patient's response, side effects, and tolerance of medication regimen.  Therapeutic Interventions: 1 to 1 sessions, Unit Group sessions and Medication administration.  Evaluation of Outcomes: Not Met  Physician Treatment Plan for Secondary  Diagnosis: Principal Problem:   MDD (major depressive disorder), recurrent, severe, with psychosis ([place]) Active Problems:   PTSD (post-traumatic stress disorder)   Cannabis use disorder  Long Term Goal(s):     Short Term Goals:       Medication Management: Evaluate patient's response, side effects, and tolerance of medication regimen.  Therapeutic Interventions: 1 to 1 sessions, Unit Group sessions and Medication administration.  Evaluation of Outcomes: Not Met   RN Treatment Plan for Primary Diagnosis: MDD (major depressive disorder), recurrent, severe, with psychosis ([place]) Long Term Goal(s): Knowledge of disease and therapeutic regimen to maintain health will improve  Short Term Goals: Ability to remain free from injury will improve, Ability to verbalize frustration and anger appropriately will improve, Ability to demonstrate self-control, Ability to participate in decision making will improve, Ability to verbalize feelings will improve, Ability to disclose and discuss suicidal ideas, Ability to identify and develop effective coping behaviors will improve, and Compliance with prescribed medications will improve  Medication Management: RN will administer medications as ordered by provider, will assess and evaluate patient's response and provide education to patient for prescribed medication. RN will report any adverse and/or side effects to prescribing provider.  Therapeutic Interventions: 1 on 1 counseling sessions, Psychoeducation, Medication administration, Evaluate responses to treatment, Monitor vital signs and CBGs as ordered, Perform/monitor CIWA, COWS, AIMS and Fall Risk screenings as ordered, Perform wound care treatments as ordered.  Evaluation of Outcomes: Not Met   LCSW Treatment Plan for Primary Diagnosis: MDD (major depressive disorder), recurrent, severe, with psychosis ([place]) Long Term Goal(s): Safe transition to appropriate next level of care at discharge, Engage  patient in therapeutic group addressing interpersonal concerns.  Short Term Goals: Engage patient in aftercare planning with referrals and resources, Increase social support, Increase ability to appropriately verbalize feelings, Increase emotional regulation, Facilitate acceptance of mental health diagnosis and concerns, Facilitate patient progression through stages of change regarding substance use diagnoses and concerns, Identify triggers associated with mental health/substance abuse issues, and Increase skills for wellness and recovery  Therapeutic Interventions: Assess for all discharge needs, 1 to 1 time with Social worker, Explore available resources and support systems, Assess for adequacy in community support network, Educate family and significant other(s) on suicide prevention, Complete Psychosocial Assessment, Interpersonal group therapy.  Evaluation of Outcomes: Not Met   Progress in Treatment: Attending groups: No. Participating in groups: No. Taking medication as prescribed: Yes. Toleration medication: Yes. Family/Significant other contact made: No, will contact:  CSW will assess and identify support person Patient understands diagnosis: Yes. Discussing patient identified problems/goals with staff: Yes. Medical problems stabilized or resolved: Yes. Denies suicidal/homicidal ideation: Yes. Issues/concerns per patient self-inventory: No. Other: none   New problem(s) identified: No, Describe:  none  New  Short Term/Long Term Goal(s):   medication stabilization, elimination of SI thoughts, development of comprehensive mental wellness plan.    Patient Goals:  medication stabilization, elimination of SI thoughts, development of comprehensive mental wellness plan.    Discharge Plan or Barriers: Patient doe not want to return to sober living home, [facility]. Patient recently admitted. CSW will continue to follow and assess for appropriate referrals and possible discharge  planning.     Reason for Continuation of Hospitalization: Anxiety Hallucinations Medication stabilization Suicidal ideation  Estimated Length of Stay: 3-7 days  Last 3 [place] Suicide Severity Risk Score: [place] Admission (Current) from 11/25/2021 in [place] 500B ED from 11/22/2021 in [place] Admission (Discharged) from 11/09/2021 in [place] 500B  C-SSRS RISK CATEGORY High Risk High Risk High Risk       Last PHQ 2/9 Scores:    11/08/2021   11:57 AM 11/05/2021    3:57 AM 11/04/2021    6:30 AM  Depression screen PHQ 2/9  Decreased Interest _0 Down, Depressed, Hopeless _1 PHQ - 2 Score _2 Altered sleeping _3 Tired, decreased energy _4 Change in appetite _5 Feeling bad or failure about yourself  _6 Trouble concentrating _7 Moving slowly or fidgety/restless _8 Suicidal thoughts _9 PHQ-9 Score _10 Difficult doing work/chores Very difficult Extremely dIfficult Extremely dIfficult    Scribe for Treatment Team: Zachery Conch, LCSW 11/26/2021 3:11 PM

## 2021-11-26 NOTE — Progress Notes (Signed)
Provider went down to talk to patient after lunch. Patient Reported he vomited up his lunch because "[name]" told him not to eat. Patient had a deodorant cap filled with what smelled like a cleaning solution in his room and gave it to the provider. Provider asked patient where he got it from. Patient stated, "I think someone left it in the bathroom." Patient told provider he started to drink the solution but did not. Provider poured the solution down the sink. Patient was assessed. No distressed noted.
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
## 2021-11-26 NOTE — Progress Notes (Signed)
   11/26/21 1000  Psych Admission Type (Psych Patients Only)  Admission Status Involuntary  Psychosocial Assessment  Patient Complaints Sleep disturbance;Depression  Eye Contact Brief  Facial Expression Anxious  Affect Anxious  Speech Logical/coherent  Interaction Assertive  Motor Activity Fidgety  Appearance/Hygiene Disheveled  Behavior Characteristics Cooperative  Mood Anxious  Aggressive Behavior  Effect No apparent injury  Thought Process  Coherency WDL  Content WDL  Delusions Paranoid  Perception Hallucinations  Hallucination Auditory  Judgment Impaired  Confusion None  Danger to Self  Current suicidal ideation? Denies  Self-Injurious Behavior No self-injurious ideation or behavior indicators observed or expressed   Agreement Not to Harm Self Yes  Description of Agreement Verbal  Danger to Others  Danger to Others None reported or observed  Danger to Others Abnormal  Harmful Behavior to others No threats or harm toward other people  Destructive Behavior No threats or harm toward property

## 2021-11-26 NOTE — Progress Notes (Signed)
Pt was encouraged but didn't attend orientation/goals group. ?

## 2021-11-26 NOTE — Group Note (Signed)
LCSW Group Therapy Note   Group Date: 11/26/2021 Start Time: 1300 End Time: 1400   Type of Therapy and Topic:  Group Therapy: Boundaries  Participation Level:  Did Not Attend  Description of Group: This group will address the use of boundaries in their personal lives. Patients will explore why boundaries are important, the difference between healthy and unhealthy boundaries, and negative and postive outcomes of different boundaries and will look at how boundaries can be crossed.  Patients will be encouraged to identify current boundaries in their own lives and identify what kind of boundary is being set. Facilitators will guide patients in utilizing problem-solving interventions to address and correct types boundaries being used and to address when no boundary is being used. Understanding and applying boundaries will be explored and addressed for obtaining and maintaining a balanced life. Patients will be encouraged to explore ways to assertively make their boundaries and needs known to significant others in their lives, using other group members and facilitator for role play, support, and feedback.  Therapeutic Goals:  1.  Patient will identify areas in their life where setting clear boundaries could be  used to improve their life.  2.  Patient will identify signs/triggers that a boundary is not being respected. 3.  Patient will identify two ways to set boundaries in order to achieve balance in  their lives: 4.  Patient will demonstrate ability to communicate their needs and set boundaries  through discussion and/or role plays  Summary of Patient Progress:  Patient did not attend group  Therapeutic Modalities:   Cognitive Behavioral Therapy Solution-Focused Therapy  Beatris Si, LCSW 11/26/2021  11:58 AM

## 2021-11-26 NOTE — Group Note (Signed)
Recreation Therapy Group Note   Group Topic:Self-Esteem  Group Date: 11/26/2021 Start Time: 1005 End Time: 1040 Facilitators: Caroll Rancher, LRT,CTRS Location: [address] Day[address]   Goal Area(s) Addresses:  Patient will successfully identify positive attributes about themselves.  Patient will identify healthy ways to increase self-esteem. Patient will acknowledge benefit(s) of improved self-esteem.   Group Description:  Radiation protection practitioner.  LRT and patients discussed the importance of having positive self esteem.  Patients were then given a worksheet of an outline of a picture frame.  Patients were to create an image of themselves, things they like and things they are proud of within the mirror.  Patients shared their mirrors with the group when completed.   Affect/Mood: N/A   Participation Level: Did not attend    Clinical Observations/Individualized Feedback:     Plan: Continue to engage patient in RT group sessions 2-3x/week.   Caroll Rancher, LRT,CTRS 11/26/2021 1:36 PM

## 2021-11-26 NOTE — Progress Notes (Signed)
Recreation Therapy Notes  INPATIENT RECREATION THERAPY ASSESSMENT  Patient Details Name: Jon Clayton MRN: [id number] DOB: [birth year] Today's Date: 11/26/2021       Information Obtained From: Patient  Able to Participate in Assessment/Interview: Yes  Patient Presentation: Alert  Reason for Admission (Per Patient): Suicidal Ideation, Other (Comments), Impulsive Behavior (Homicidal Ideation)  Patient Stressors: Other (Comment) ("entire life")  Coping Skills:   Isolation, Self-Injury, Music, Deep Breathing, Substance Abuse, Art, Avoidance, Read, Hot Bath/Shower  Leisure Interests (2+):  Games - Video games, Technical brewer - Other (Comment) (walk in the woods)  Frequency of Recreation/Participation: Other (Comment) ("been months")  Awareness of Community Resources:  Yes  Community Resources:  Research scientist (physical sciences), [employer], [employer]  Current Use: No  If no, Barriers?: Other (Comment) (Pt stated he is registered.)  Expressed Interest in [employer] Information: No  [employer] of Residence:  Engineer, technical sales  Patient Main Form of Transportation: Walk  Patient Strengths:  Loyalty  Patient Identified Areas of Improvement:  "everything, I don't know how to function in society"  Patient Goal for Hospitalization:  "to know I'm not gonna do what [name] tells me to do and find long term treatment"  Current SI (including self-harm):  Yes (Rated a 6 out of 10; Contracts for safety)  Current HI:  Yes (Rated a 6 out of 10; Contracts stated it wasn't towards anyone here)  Current AVH: Yes (Hearing and feel Annamaria Helling)  Staff Intervention Plan: Group Attendance, Collaborate with Interdisciplinary Treatment Team  Consent to Intern Participation: N/A   Caroll Rancher, Richardean Sale, Klaryssa Fauth A 11/26/2021, 2:06 PM
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
## 2021-11-27 DIAGNOSIS — F609 Personality disorder, unspecified: Secondary | ICD-10-CM | POA: Diagnosis present

## 2021-11-27 DIAGNOSIS — R4589 Other symptoms and signs involving emotional state: Secondary | ICD-10-CM | POA: Diagnosis present

## 2021-11-27 DIAGNOSIS — Z72811 Adult antisocial behavior: Secondary | ICD-10-CM | POA: Diagnosis present

## 2021-11-27 DIAGNOSIS — F333 Major depressive disorder, recurrent, severe with psychotic symptoms: Secondary | ICD-10-CM | POA: Diagnosis not present

## 2021-11-27 DIAGNOSIS — M797 Fibromyalgia: Secondary | ICD-10-CM | POA: Diagnosis present

## 2021-11-27 DIAGNOSIS — Z9189 Other specified personal risk factors, not elsewhere classified: Secondary | ICD-10-CM

## 2021-11-27 MED ORDER — OLANZAPINE 10 MG PO TBDP
10.0000 mg | ORAL_TABLET | Freq: Every day | ORAL | Status: DC
  Filled 2021-11-27 (×2): qty 1

## 2021-11-27 MED ORDER — OLANZAPINE 10 MG PO TBDP
10.0000 mg | ORAL_TABLET | Freq: Every day | ORAL | Status: DC
  Administered 2021-11-29: 10 mg via ORAL
  Filled 2021-11-27 (×9): qty 1

## 2021-11-27 MED ORDER — OLANZAPINE 10 MG PO TBDP
10.0000 mg | ORAL_TABLET | Freq: Four times a day (QID) | ORAL | Status: DC | PRN
  Administered 2021-11-29: 10 mg via ORAL
  Filled 2021-11-27 (×2): qty 1

## 2021-11-27 MED ORDER — QUETIAPINE FUMARATE ER 200 MG PO TB24
400.0000 mg | ORAL_TABLET | Freq: Every day | ORAL | Status: DC
  Administered 2021-11-27 – 2021-12-14 (×17): 400 mg via ORAL
  Filled 2021-11-27 (×5): qty 2
  Filled 2021-11-27: qty 1
  Filled 2021-11-27 (×16): qty 2

## 2021-11-27 MED ORDER — OLANZAPINE 10 MG IM SOLR
10.0000 mg | Freq: Four times a day (QID) | INTRAMUSCULAR | Status: DC | PRN
  Administered 2021-11-27 – 2021-12-04 (×3): 10 mg via INTRAMUSCULAR
  Filled 2021-11-27 (×4): qty 10

## 2021-11-27 NOTE — Progress Notes (Signed)
1:1 NOTE  Patient continued to be observed 1:1 for self injurious behavior. Patient paced in his room before going to sleep. Patient sleeping respirations noted. No S/S of distress. Will continue to monitor.

## 2021-11-27 NOTE — Group Note (Deleted)
LCSW Group Therapy Note   Group Date: 11/27/2021 Start Time: 1000 End Time: 1100   Type of Therapy and Topic:  Group Therapy:   Participation Level:  {[facility] PARTICIPATION LEVEL:22264}  Description of Group:   Therapeutic Goals:  1.     Summary of Patient Progress:    ***  Therapeutic Modalities:   Rodman Recupero J Grossman-Orr, LCSWA 11/27/2021  9:41 AM    

## 2021-11-27 NOTE — Group Note (Signed)
LCSW Group Therapy Note  11/27/2021   10:00-11:00am   Type of Therapy and Topic:  Group Therapy: Anger Cues and Responses  Participation Level:  Did Not Attend   Description of Group:   In this group, patients learned how to recognize the physical, cognitive, emotional, and behavioral responses they have to anger-provoking situations.  They identified a recent time they became angry and how they reacted.  They analyzed how their reaction was possibly beneficial and how it was possibly unhelpful.  The group discussed a variety of healthier coping skills that could help with such a situation in the future.  They also learned that anger is a second emotion fueled by other feelings and explored their own emotions that may frequently fuel their anger.  Focus was placed on how helpful it is to recognize the underlying emotions to our anger, because working on those can lead to a more permanent solution as well as our ability to focus on the important rather than the urgent.  Therapeutic Goals: Patients will remember their last incident of anger and how they felt emotionally and physically, what their thoughts were at the time, and how they behaved. Patients will identify how their behavior at that time worked for them, as well as how it worked against them. Patients will explore possible new behaviors to use in future anger situations. Patients will learn that anger itself is normal and cannot be eliminated, and that healthier reactions can assist with resolving conflict rather than worsening situations. Patients will learn that anger is a secondary emotion and worked to identify some of the underlying feelings that may lead to anger.  Summary of Patient Progress:  The patient did not attend group, said earlier he was too agitated to do so.  Therapeutic Modalities:   Cognitive Behavioral Therapy  Lynnell Chad
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
## 2021-11-27 NOTE — Progress Notes (Addendum)
1:1 notes  1000: pt is in the dayroom talking to the SW. Pt was stating that he was not going to shower or use the bathroom because he has no privacy. Pt stated just let him be so he can just kill himself and "we won't have to worry about it anymore." Pt is still unable to contract for safety. Pt is anxious. Pt is safe on the unit and will continue to monitor.   1400: Pt is in his room standing talking to MHT. Pt is talking about a movie he had just seen about personality disorders. Pt has been in his room for the majority of the day. Pt is showing no signs of distress and is safe on the unit. RN will continue to monitor.  1415: Pt started screaming, cussing, and punching the walls. Pt received an IM injection of Zyprexa 10 mg. Pt refused the PO and asked for the shot. Pt is calm at the moment and in his room. Pt is safe and RN will continue to monitor.   1800: Pt came up to window for medication with no issues. Pt apologized for screaming earlier. Pt is calm and cooperative. Pt went back to room talking to MHT. Pt is showing no signs of distress and is safe on the unit. RN will continue to monitor.

## 2021-11-27 NOTE — Progress Notes (Signed)
1:1 Note  Patient in room sleeping 1:1 Observation ongoing without self injurious behavior. Will continue to monitor.

## 2021-11-27 NOTE — Progress Notes (Signed)
Adult Psychoeducational Group Note  Date:  11/27/2021 Time:  8:48 PM  Group Topic/Focus:  Wrap-Up Group:   The focus of this group is to help patients review their daily goal of treatment and discuss progress on daily workbooks.  Participation Level:  Did Not Attend  Participation Quality:  Did Not Attend  Affect:  Did Not Attend  Cognitive:  Did Not Attend  Insight: None  Engagement in Group:  Did Not Attend  Modes of Intervention:  Did Not Attend  Additional Comments:   Pt was encouraged to attend wrap up group but refused  Vevelyn Pat 11/27/2021, 8:48 PM
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
## 2021-11-27 NOTE — BHH Group Notes (Signed)
.  Psychoeducational Group Note  Date: 11/27/2021 Time: 0900-1000    Goal Setting   Purpose of Group: This group helps to provide patients with the steps of setting a goal that is specific, measurable, attainable, realistic and time specific. A discussion on how we keep ourselves stuck with negative self talk. Homework given for Patients to write 30 positive attributes about themselves.    Participation Level:  Did not attend  Dione Housekeeper
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
## 2021-11-28 DIAGNOSIS — F333 Major depressive disorder, recurrent, severe with psychotic symptoms: Secondary | ICD-10-CM | POA: Diagnosis not present

## 2021-11-28 NOTE — Progress Notes (Signed)
1:1 NOTE  Patient in room refused to get up for vital signs this morning V/S checked in room. Patient is very minimal. Support and encouragement provided. 1:1 Observation ongoing. Gatorade given for BP 101/52 and Patient encouraged fluids.

## 2021-11-28 NOTE — Progress Notes (Signed)
Adult Psychoeducational Group Note  Date:  11/28/2021 Time:  8:33 PM  Group Topic/Focus:  Wrap-Up Group:   The focus of this group is to help patients review their daily goal of treatment and discuss progress on daily workbooks.  Participation Level:  Active  Participation Quality:  Appropriate and Attentive  Affect:  Appropriate  Cognitive:  Alert and Appropriate  Insight: Appropriate  Engagement in Group:  Engaged  Modes of Intervention:  Discussion  Additional Comments:   Pt was engaged and vulnerable during group discussion. Pt says he was able to communicate openly with his care team more today and expressed some remorse for how his time her had started. One oh his goals is develop a mindset that is more treatment driven and take everything one step at a time.   Vevelyn Pat 11/28/2021, 8:33 PM

## 2021-11-28 NOTE — [facility] Group Notes (Signed)
Adult Psychoeducational Group Not Date:  11/28/2021 Time:  0900-1045 Group Topic/Focus: PROGRESSIVE RELAXATION. A group where deep breathing is taught and tensing and relaxation muscle groups is used. Imagery is used as well.  Pts are asked to imagine 3 pillars that hold them up when they are not able to hold themselves up and to share that with the group.  Participation Level: did not attend  Jon Clayton A   

## 2021-11-28 NOTE — Progress Notes (Signed)
Pt stated he was doing better, pt interaction with staff has been better this evening    11/28/21 2200  Psych Admission Type (Psych Patients Only)  Admission Status Involuntary  Psychosocial Assessment  Patient Complaints Anxiety  Eye Contact Brief  Facial Expression Anxious  Affect Preoccupied  Speech Tangential  Interaction Childlike  Motor Activity Fidgety  Appearance/Hygiene Unremarkable  Behavior Characteristics Cooperative;Anxious  Mood Anxious;Pleasant  Aggressive Behavior  Effect No apparent injury  Thought Process  Coherency Circumstantial  Content Preoccupation;Blaming others  Delusions Paranoid  Perception Hallucinations  Hallucination Auditory  Judgment Poor  Confusion None  Danger to Self  Current suicidal ideation? Passive  Self-Injurious Behavior Some self-injurious ideation observed or expressed.  No lethal plan expressed  (pt continues to be on 1:1)
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
## 2021-11-28 NOTE — Progress Notes (Signed)
Nursing 1:1 note D:Pt observed sleeping in bed with eyes closed. RR even and unlabored. No distress noted. A: 1:1 observation continues for safety  R: pt remains safe  

## 2021-11-28 NOTE — Progress Notes (Signed)
Nursing 1:1 Note  D: Patient observed resting in bed with eyes closed. Respirations even and unlabored. No distress noted.    A: Patient remains on 1:1 observation for safety   R: Patient remains safe

## 2021-11-28 NOTE — Progress Notes (Signed)
Nursing 1:1 note  D: Patient in his room laying in bed eyes closed resting. Calm and cooperative. No distress noted.   A: 1:1 observation continued for patient safety   R: Pt remains safe

## 2021-11-28 NOTE — Progress Notes (Signed)
Nursing 1:1  D: Patient resting in bed with eyes open. Calm and cooperative. No distress noted.   A: 1:1 observation continued for safety  R: Pt remains safe

## 2021-11-28 NOTE — Group Note (Signed)
[facility] Group Notes: (Clinical Social Work)   11/28/2021      Type of Therapy:  Group Therapy   Participation Level:  Did Not Attend - was invited both individually by [facility] and by overhead announcement, chose not to attend.   Ambrose Mantle, LCSW 11/28/2021, 1:46 PM

## 2021-11-28 NOTE — Progress Notes (Signed)
   11/28/21 1100  Psych Admission Type (Psych Patients Only)  Admission Status Involuntary  Psychosocial Assessment  Patient Complaints Anxiety  Eye Contact Brief  Facial Expression Anxious  Affect Anxious;Preoccupied  Speech Tangential  Interaction Cautious  Motor Activity Fidgety  Appearance/Hygiene Unremarkable  Behavior Characteristics Cooperative;Anxious  Mood Anxious  Thought Process  Coherency Circumstantial  Content Blaming others;Preoccupation  Delusions Paranoid  Perception Hallucinations  Hallucination Auditory  Judgment Poor  Confusion None  Danger to Self  Current suicidal ideation? Passive  Self-Injurious Behavior No self-injurious ideation or behavior indicators observed or expressed   Agreement Not to Harm Self No  Description of Agreement Does not contract for safety  Danger to Others  Danger to Others None reported or observed  Danger to Others Abnormal  Harmful Behavior to others No threats or harm toward other people  Destructive Behavior No threats or harm toward property

## 2021-11-28 NOTE — Progress Notes (Signed)
1:1 NOTE   Patient in bed sleeping respirations noted no S/S of distress 1:1 observation continued for self injurious behavior Patient remains safe.

## 2021-11-28 NOTE — Progress Notes (Signed)
1:1 NOTE  Patient in bed refused group and bedtime medications. He continues to endorse SI with plan to jump off bridge. Support and encouragement provided. Patient went back to sleep, respirations noted no s/s of distress.

## 2021-11-29 DIAGNOSIS — F333 Major depressive disorder, recurrent, severe with psychotic symptoms: Secondary | ICD-10-CM | POA: Diagnosis not present
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
## 2021-11-29 NOTE — Progress Notes (Signed)
Nursing 1:1 note D:Pt observed sitting in bed with eyes open. RR even and unlabored. No distress noted.Pt used the bathroom at 2300  A: 1:1 observation continues for safety  R: pt remains safe

## 2021-11-29 NOTE — Progress Notes (Signed)
Nursing 1:1 note D:Pt observed sleeping in bed with eyes closed. RR even and unlabored. No distress noted. A: 1:1 observation continues for safety  R: pt remains safe  

## 2021-11-29 NOTE — Group Note (Signed)
LCSW Group Therapy Note  Group Date: 11/29/2021 Start Time: 1300 End Time: 1400   Type of Therapy and Topic:  Group Therapy - How To Cope with Nervousness about Discharge   Participation Level:  Did Not Attend   Description of Group This process group involved identification of patients' feelings about discharge. Some of them are scheduled to be discharged soon, while others are new admissions, but each of them was asked to share thoughts and feelings surrounding discharge from the hospital. One common theme was that they are excited at the prospect of going home, while another was that many of them are apprehensive about sharing why they were hospitalized. Patients were given the opportunity to discuss these feelings with their peers in preparation for discharge.  Therapeutic Goals  Patient will identify their overall feelings about pending discharge. Patient will think about how they might proactively address issues that they believe will once again arise once they get home (i.e. with parents). Patients will participate in discussion about having hope for change.   Summary of Patient Progress:  Did not attend   Therapeutic Modalities Cognitive Behavioral Therapy   Beatris Si, LCSW  11/29/2021  1:54 PM

## 2021-11-29 NOTE — Progress Notes (Addendum)
Pt stated he was agitated and anxious, pt given PRN medications per [facility] with HS medications, pt informed if it does not get better he can get PRN Zyprexa per [facility] Pt continues to request LT Tx , pt encouraged to talk to SW about his possible options.    11/29/21 2100  Psych Admission Type (Psych Patients Only)  Admission Status Involuntary  Psychosocial Assessment  Patient Complaints Agitation  Eye Contact Brief  Facial Expression Anxious  Affect Preoccupied  Speech Tangential  Interaction Childlike  Motor Activity Fidgety  Appearance/Hygiene Unremarkable  Behavior Characteristics Cooperative;Agitated  Mood Anxious;Irritable  Aggressive Behavior  Effect No apparent injury  Thought Process  Coherency Circumstantial  Content Preoccupation;Blaming others  Delusions Paranoid  Perception Hallucinations  Hallucination Auditory  Judgment Poor  Confusion None  Danger to Self  Current suicidal ideation? Passive  Self-Injurious Behavior Some self-injurious ideation observed or expressed.  No lethal plan expressed  (pt continues to be on 1:1)

## 2021-11-29 NOTE — Progress Notes (Signed)
Pt up pacing room having hard time relaxing and laying down, pt given PRN Zyprexa per [facility]

## 2021-11-29 NOTE — [facility] Group Notes (Signed)
Patient did not attend the Wrap-up group. 

## 2021-11-29 NOTE — Progress Notes (Signed)
1:1 Nursing Note: Pt calm and cooperative, though irritable and complaining about current medication regimen. "I'm only happy when I am stoned."  Remains on 1:1 for safety. States that he continues to have passive SI, "I feel like I deserve  pain and to die, but that's not going to happen in here."  Endorses intermittent hallucinations, Jon Clayton has been in my life since I was 26 years old, he tries to take over me, his voice sounds like me but deeper and darker."  Pt able to verbally contract for safety at this moment but also shares he is irritable and has chronic suicidal ideation. Shared that he talked all day yesterday, "I couldn't stop talking but today I am calmer." MHT present for safety.    11/29/21 0830  Psych Admission Type (Psych Patients Only)  Admission Status Involuntary  Psychosocial Assessment  Patient Complaints Irritability;Anxiety  Eye Contact Fair  Facial Expression Anxious  Affect Anxious;Labile;Irritable  Child psychotherapist Assertive  Motor Activity Fidgety  Appearance/Hygiene Unremarkable  Behavior Characteristics Cooperative;Anxious  Mood Anxious;Irritable  Thought Process  Coherency Circumstantial  Content Blaming others;Preoccupation  Delusions Paranoid  Perception Hallucinations  Hallucination Auditory  Judgment Poor  Confusion None  Danger to Self  Current suicidal ideation? Passive  Description of Suicide Plan Verbalizes desire but no plan.  Self-Injurious Behavior  (No currently.)  Agreement Not to Harm Self Yes  Description of Agreement Remains on 1:1 for safety.  Danger to Others Abnormal  Harmful Behavior to others No threats or harm toward other people (Not currently.)  Destructive Behavior No threats or harm toward property (Not currently.)

## 2021-11-29 NOTE — Progress Notes (Signed)
Nursing 1:1 Note: 1400   Pt quiet and isolative in his room, not talking much and resting intermittently. Shared, "I can't function in society long term, the outside world isn't for me."  Rates is depression level 8/10, hopelessness 9/10 and anxiety is 7/10. Pt did not attend groups today, remained in bed mostly. Also, wanting to know how long he will be on 1:1. Pt remains on 1:1 for safety.

## 2021-11-29 NOTE — Progress Notes (Signed)
Nursing 1:1 Note:  Pt resting in bed, he ate his dinner and states he feels calm right now.  Pt requested medication at 1545. Hydrozyzine 25mg  PO and Zyprexa 10mg  SL given as ordered. Pt stated that he was feeling very upset and trying not to explode.  Pt has been calm and isolative throughout shift. Observed blanket over his eyes in bed, "trying to calm the voices."  Remains safe in the unit on 1:1.

## 2021-11-29 NOTE — Progress Notes (Signed)
   11/29/21 0500  Sleep  Number of Hours 8
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
## 2021-11-29 NOTE — Group Note (Signed)
Recreation Therapy Group Note   Group Topic:Problem Solving  Group Date: 11/29/2021 Start Time: 1005 End Time: 1040 Facilitators: Caroll Rancher, LRT,CTRS Location: [address] Dayroom   Goal Area(s) Addresses:  Patient will effectively work with peer towards shared goal.  Patient will identify skills used to make activity successful.  Patient will share challenges and verbalize solution-driven approaches used. Patient will identify how skills used during activity can be used to reach post d/c goals.   Group Description:  [employer]. Patients were provided the following materials: 2 drinking straws, 5 rubber bands, 5 paper clips, 2 index cards and 2 drinking cups. Using the provided materials patients were asked to build a launching mechanism to launch a ping pong ball across the room, approximately 10 feet. Patients were divided into teams of 3-5. Instructions required all materials be incorporated into the device, functionality of items left to the peer group's discretion.   Affect/Mood: N/A   Participation Level: Did not attend    Clinical Observations/Individualized Feedback:     Plan: Continue to engage patient in RT group sessions 2-3x/week.   Caroll Rancher, Antonietta Jewel  11/29/2021 12:27 PM

## 2021-11-30 DIAGNOSIS — F333 Major depressive disorder, recurrent, severe with psychotic symptoms: Secondary | ICD-10-CM | POA: Diagnosis not present

## 2021-11-30 MED ORDER — TRAZODONE HCL 50 MG PO TABS
50.0000 mg | ORAL_TABLET | Freq: Every evening | ORAL | Status: DC | PRN
  Administered 2021-11-30 – 2021-12-13 (×11): 50 mg via ORAL
  Filled 2021-11-30 (×12): qty 1

## 2021-11-30 MED ORDER — QUETIAPINE FUMARATE ER 200 MG PO TB24
200.0000 mg | ORAL_TABLET | Freq: Every day | ORAL | Status: DC
  Administered 2021-11-30 – 2021-12-15 (×16): 200 mg via ORAL
  Filled 2021-11-30 (×18): qty 1

## 2021-11-30 NOTE — Progress Notes (Signed)
Nursing 1:1 note D:Pt observed sleeping in bed with eyes closed. RR even and unlabored. No distress noted. A: 1:1 observation continues for safety  R: pt remains safe  

## 2021-11-30 NOTE — Progress Notes (Signed)
Patient noted to be highly agitated, pacing threatening to kill self, hitting on the wall, and verbally abusive to staff. No redirection was effective in calming patient down. Staff had to administer PRN IM Zyprexa 10 mg at 1251. MD notified. Staff will continue to monitor.

## 2021-11-30 NOTE — Progress Notes (Signed)
Patient is in bed resting at this time. No acute distress noted. Patient denies any pain or discomfort. Patient will continue to be on a 1:1 observation. Staff will continue to support and reassure patient

## 2021-11-30 NOTE — Progress Notes (Signed)
   11/30/21 2000  Psych Admission Type (Psych Patients Only)  Admission Status Involuntary  Psychosocial Assessment  Patient Complaints Anxiety  Eye Contact Brief  Facial Expression Anxious  Affect Preoccupied  Speech Tangential  Interaction Childlike  Motor Activity Fidgety  Appearance/Hygiene Unremarkable  Behavior Characteristics Cooperative  Mood Anxious  Aggressive Behavior  Effect No apparent injury  Thought Process  Coherency Circumstantial  Content Preoccupation;Blaming others  Delusions Paranoid  Perception Hallucinations  Hallucination Auditory  Judgment Poor  Confusion None  Danger to Self  Current suicidal ideation? Passive  Self-Injurious Behavior Some self-injurious ideation observed or expressed.  No lethal plan expressed  (pt continues to be on 1:1)  Description of Agreement 1:1

## 2021-11-30 NOTE — Plan of Care (Signed)
  Problem: Safety: Goal: Periods of time without injury will increase Outcome: Progressing   
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
## 2021-11-30 NOTE — Progress Notes (Signed)
   11/30/21 0800  Psych Admission Type (Psych Patients Only)  Admission Status Involuntary  Psychosocial Assessment  Patient Complaints Agitation  Eye Contact Intense  Facial Expression Anxious  Affect Preoccupied  Speech Tangential  Interaction Childlike  Motor Activity Fidgety  Appearance/Hygiene Unremarkable  Behavior Characteristics Cooperative;Agitated  Mood Anxious;Irritable  Aggressive Behavior  Targets Self  Type of Behavior Threatening  Effect No apparent injury  Thought Process  Coherency Circumstantial  Content Blaming others;Preoccupation  Delusions Paranoid  Perception Derealization  Hallucination None reported or observed  Judgment Poor  Confusion None  Danger to Self  Current suicidal ideation? Passive  Description of Suicide Plan Verbalized desire but no plan  Self-Injurious Behavior Some self-injurious ideation observed or expressed.  No lethal plan expressed   Description of Agreement 1:1  Danger to Others  Danger to Others None reported or observed

## 2021-11-30 NOTE — Progress Notes (Signed)
Patient in room at this time and being observed by staff on a 1:1 observation. Patient is highly irritable, patient states he does not want to go back on the street now because he can not function well. Patient mentioned about going to jail for what he did not do. Patient is compliant with routine medication with no side effect note. No acute distress noted at this time. Patient will continue to be on 1:1 observation because he can not contract for safety at this time. Staff will continue to provide support.

## 2021-11-30 NOTE — Progress Notes (Addendum)
[facility] MD Progress Note  11/30/2021 12:33 PM Deavon Podgorski  MRN:  [id number]  Subjective:   Jon Clayton is a 26 y.o., male with a past psychiatric history significant for depression, PTSD, substance use who presented to the [facility] from ER for evaluation and management of depression and PTSD initially on 11/11/2021. He was in [facility] for 7 days and discharged on 11/18/2021 to his room in a [facility] home. He did not fill his medications because he had a few pills left from his last medications. The next day he went out and put in some new job applications. He got a phone call from one of his previous applications where he was notified that he did not pass his background check due to his sexual offender status and "it all kind of rushed into my head at once" and he became suicidal.  Yesterday the psychiatry team made the following recommendations: -Continue one-to-one sitter -Elavil 25mg  PO QHS. Patient non-compliant with cymbalta due to sexual side effects -Melatonin 5mg  PO QHS, remeron 7.5mg  PO QHS PRN sleep -Seroquel 400mg  PO QHS (or Zydis 10mg  PO QHS if patient unable to keep food down or has purging behaviors)   On my assessment today, the patient reports that his mood continues to be very irritable and dysphoric.  He continues to report feeling very down and depressed due to his situation (not being able to find a job due to being on the sexual offender list, and is preventing him from having housing options as well).  He reports " I am so over this.  I just cannot do it anymore.  I cannot do anything because I cannot find a job.  I might as well just kill myself.  I know a person where I can get a gun.  I am just going to get that gun and kill myself.  I know other ways to kill myself as well.  I might kill the house manager as well."  Patient reports feeling hopeless and worthless.  He does report that his sleep is better with the current combination of psychiatric  medications.  He reports feeling more irritable during the day, and requests that Seroquel be changed to what he was only was discharged from the hospital last time. He reported continued to have auditory hallucinations, objects, that tell him to harm himself and harm others. Denies VH.  Denies paranoia, thought control, thought insertion, or ideas of reference. He does appear to be responding to internal stimuli.  When presented with solutions for various problems that he presents, he finds ways to disregard these or bring up other problems that would prevent him from having some sort of stability outside of the hospital.  He is help seeking/help rejecting.  He otherwise denies having any side effects to current psychiatric medications.  Principal Problem: MDD (major depressive disorder), recurrent, severe, with psychosis (HCC) Diagnosis: Principal Problem:   MDD (major depressive disorder), recurrent, severe, with psychosis (HCC) Active Problems:   PTSD (post-traumatic stress disorder)   Cannabis use disorder   Fibromyalgia   Personality disorder (HCC)   At high risk for self harm   Adult antisocial behavior   Suicidal behavior  Total Time spent with patient: 20 minutes  Past Psychiatric History:  Previous Psychiatric Diagnoses: MDD with psychotic features, PTSD, polysubstance use, dissociative disorder Current / Past Mental Health Providers: Denies Prior Inpatient or Outpatient Therapy: Reports multiple psychiatric hospitalization at least 7 in the past 12 months in different  facilities  Most recently at [facility] 11/18/2021  Past Suicide Attempts: Reports at least 10 times attempted to harm himself last time by cutting his wrist using plastic spoon in May 2022 when he was in jail Past History of Homicidal Behaviors / Violent or Aggressive Behaviors: Denies History of Self-Mutilation: Vaguely reports self cutting but vague about details Previous Participation in PHP/IOP or Residential  Programs: Denies Past History of ECT / TMS / VNS: Denies Past Psychotropic Medication Trials: Reports multiple medication treatment  Past Medical History:  Past Medical History:  Diagnosis Date   Depression    Psychosis (HCC)     Past Surgical History:  Procedure Laterality Date   NASAL SINUS SURGERY Bilateral    TONSILLECTOMY     Family History: History reviewed. No pertinent family history.  Family Psychiatric  History: Reports maternal uncle committed suicide, reports multiple family members with bipolar disorder and schizophrenia   Social History:  Social History   Substance and Sexual Activity  Alcohol Use Not Currently   Comment: reports periodic alcohol use     Social History   Substance and Sexual Activity  Drug Use Yes   Frequency: 1.0 times per week   Types: Marijuana   Comment: 1/3 gm daily    Social History   Socioeconomic History   Marital status: Single    Spouse name: Not on file   Number of children: Not on file   Years of education: Not on file   Highest education level: 8th grade  Occupational History   Not on file  Tobacco Use   Smoking status: Every Day    Packs/day: 1.00    Years: 11.00    Total pack years: 11.00    Types: Cigarettes   Smokeless tobacco: Never  Substance and Sexual Activity   Alcohol use: Not Currently    Comment: reports periodic alcohol use   Drug use: Yes    Frequency: 1.0 times per week    Types: Marijuana    Comment: 1/3 gm daily   Sexual activity: Not Currently  Other Topics Concern   Not on file  Social History Narrative   Not on file   Social Determinants of Health   Financial Resource Strain: Not on file  Food Insecurity: Not on file  Transportation Needs: Not on file  Physical Activity: Not on file  Stress: Not on file  Social Connections: Not on file   Additional Social History:                         Sleep: Fair  Appetite:  Fair  Current Medications: Current  Facility-Administered Medications  Medication Dose Route Frequency Provider Last Rate Last Admin   acetaminophen (TYLENOL) tablet 650 mg  650 mg Oral Q6H PRN White, Patrice L, NP   650 mg at 11/26/21 0820   alum & mag hydroxide-simeth (MAALOX/MYLANTA) 200-200-20 MG/5ML suspension 30 mL  30 mL Oral Q4H PRN White, Patrice L, NP       amitriptyline (ELAVIL) tablet 25 mg  25 mg Oral QHS Hill, Shelbie Hutching, MD   25 mg at 11/29/21 2035   cloNIDine (CATAPRES) tablet 0.1 mg  0.1 mg Oral Q12H White, Patrice L, NP   0.1 mg at 11/30/21 0815   hydrOXYzine (ATARAX) tablet 25 mg  25 mg Oral TID PRN White, Patrice L, NP   25 mg at 11/30/21 0817   magnesium hydroxide (MILK OF MAGNESIA) suspension 30 mL  30 mL Oral  Daily PRN White, Patrice L, NP       melatonin tablet 5 mg  5 mg Oral QHS Hill, Shelbie Hutching, MD   5 mg at 11/29/21 2035   neomycin-bacitracin-polymyxin (NEOSPORIN) ointment   Topical BID Bobbitt, Shalon E, NP   Given at 11/30/21 4401   nicotine (NICODERM CQ - dosed in mg/24 hours) patch 14 mg  14 mg Transdermal Daily Hill, Shelbie Hutching, MD   14 mg at 11/30/21 0816   OLANZapine zydis (ZYPREXA) disintegrating tablet 10 mg  10 mg Oral Q6H PRN Roselle Locus, MD   10 mg at 11/29/21 2150   Or   OLANZapine (ZYPREXA) injection 10 mg  10 mg Intramuscular Q6H PRN Roselle Locus, MD   10 mg at 11/27/21 1414   QUEtiapine (SEROQUEL XR) 24 hr tablet 400 mg  400 mg Oral QPC supper Roselle Locus, MD   400 mg at 11/28/21 1810   Or   OLANZapine zydis (ZYPREXA) disintegrating tablet 10 mg  10 mg Oral QPC supper Roselle Locus, MD   10 mg at 11/29/21 1550   ondansetron (ZOFRAN-ODT) disintegrating tablet 4 mg  4 mg Oral Q8H PRN Roselle Locus, MD       QUEtiapine (SEROQUEL XR) 24 hr tablet 200 mg  200 mg Oral Daily Takako Minckler, MD   200 mg at 11/30/21 1212   traZODone (DESYREL) tablet 50 mg  50 mg Oral QHS PRN Fontaine Kossman, Harrold Donath, MD        Lab Results: No results  found for this or any previous visit (from the past 48 hour(s)).  Blood Alcohol level:  Lab Results  Component Value Date   ETH <10 11/22/2021   ETH <10 11/04/2021    Metabolic Disorder Labs: Lab Results  Component Value Date   HGBA1C 4.9 11/10/2021   MPG 93.93 11/10/2021   No results found for: "PROLACTIN" Lab Results  Component Value Date   CHOL 111 11/10/2021   TRIG 98 11/10/2021   HDL 36 (L) 11/10/2021   CHOLHDL 3.1 11/10/2021   VLDL 20 11/10/2021   LDLCALC 55 11/10/2021    Physical Findings: AIMS: Facial and Oral Movements Muscles of Facial Expression: None, normal Lips and Perioral Area: None, normal Jaw: None, normal Tongue: None, normal,Extremity Movements Upper (arms, wrists, hands, fingers): None, normal Lower (legs, knees, ankles, toes): None, normal, Trunk Movements Neck, shoulders, hips: None, normal, Overall Severity Severity of abnormal movements (highest score from questions above): None, normal Incapacitation due to abnormal movements: None, normal Patient's awareness of abnormal movements (rate only patient's report): No Awareness, Dental Status Current problems with teeth and/or dentures?: No Does patient usually wear dentures?: No  CIWA:    COWS:     Musculoskeletal: Strength & Muscle Tone: Laying in bed   Gait & Station: Laying in bed   Patient leans: Laying in bed    Psychiatric Specialty Exam:  Presentation  General Appearance: Casual  Eye Contact:Poor  Speech:Normal Rate  Speech Volume:Normal  Handedness:Right   Mood and Affect  Mood:Anxious; Depressed; Dysphoric; Irritable; Hopeless; Worthless  Affect:Full Range   Thought Process  Thought Processes:Linear  Descriptions of Associations:Intact  Orientation:Full (Time, Place and Person)  Thought Content:Logical  History of Schizophrenia/Schizoaffective disorder:Yes  Duration of Psychotic Symptoms:Greater than six months  Hallucinations:Hallucinations:  Auditory Description of Auditory Hallucinations: "[name]" telling him to do things to harm self and others  Ideas of Reference:None  Suicidal Thoughts:Suicidal Thoughts: Yes, Active SI Active Intent and/or Plan: With Intent; With  Plan  Homicidal Thoughts:Homicidal Thoughts: Yes, Active HI Active Intent and/or Plan: With Intent; With Plan HI Passive Intent and/or Plan: Without Access to Means; Without Means to Carry Out   Sensorium  Memory:Immediate Good; Recent Good; Remote Good  Judgment:Impaired  Insight:Lacking   Executive Functions  Concentration:Poor  Attention Span:Fair  Recall:Fair  Fund of Knowledge:Fair  Language:Fair   Psychomotor Activity  Psychomotor Activity:Psychomotor Activity: Normal   Assets  Assets:Leisure Time   Sleep  Sleep:Sleep: Fair    Physical Exam: Physical Exam Vitals reviewed.  Pulmonary:     Effort: Pulmonary effort is normal.  Neurological:     Mental Status: He is alert.    Review of Systems  Constitutional:  Negative for chills and fever.  Cardiovascular:  Negative for chest pain and palpitations.  Psychiatric/Behavioral:  Positive for depression, hallucinations and suicidal ideas. The patient is nervous/anxious.    Blood pressure 117/76, pulse 79, temperature 97.8 F (36.6 C), temperature source Oral, resp. rate 16, height 5\' 7"  (1.702 m), weight 75.3 kg, SpO2 100 %. Body mass index is 26 kg/m.   Treatment Plan Summary: Daily contact with patient to assess and evaluate symptoms and progress in treatment and Medication management   Assessment: -MDD with psychotic features -Rule out OCD vs (and/or) GAD  -(per history): PTSD, polysubstance use, dissociative disorder  Plan:  Medications: -Continue Elavil 25mg  PO QHS. Patient non-compliant with cymbalta due to sexual side effects -Continue Melatonin 5mg  PO QHS -D/c Remeron 7.5mg  PO QHS PRN sleep - dc to reduce polypharmacy  -Start trazodone 50 mg qhs prn for sleep   -Continue Seroquel XL 400mg  PO QHS (or Zydis 10mg  PO QHS if patient unable to keep food down or has purging behaviors)  -Start Seroquel XL 200 mg once daily in the morning -patient reports feeling more irritable, and states that when he was taking Seroquel during the day, during previous admission, this was helpful. -Continue to hold buspar for now   Substance Abuse: brief intervention provided abstinence advised.  Personality disorder:  Firm and consistent boundaries with patient No extraneous items allowed in the room.  Avoiding controlled substances Medical: PRNs for pain, constipation, indigestion available.  Safety and Monitoring: In/voluntary admission to inpatient psychiatric unit for safety, stabilization and treatment Daily contact with patient to assess and evaluate symptoms and progress in treatment Patient's case to be discussed in multi-disciplinary team meeting Observation Level : q15 minute checks Vital signs: q12 hours Precautions: suicide, withdrawal, elopement     , MD 11/30/2021, 12:33 PM  Total Time Spent in Direct Patient Care:  I personally spent 35 minutes on the unit in direct patient care. The direct patient care time included face-to-face time with the patient, reviewing the patient's chart, communicating with other professionals, and coordinating care. Greater than 50% of this time was spent in counseling or coordinating care with the patient regarding goals of hospitalization, psycho-education, and discharge planning needs.   , MD Psychiatrist

## 2021-11-30 NOTE — Progress Notes (Signed)
   11/30/21 0500  Sleep  Number of Hours 7    

## 2021-12-01 ENCOUNTER — Encounter (HOSPITAL_COMMUNITY): Payer: Self-pay

## 2021-12-01 DIAGNOSIS — F333 Major depressive disorder, recurrent, severe with psychotic symptoms: Secondary | ICD-10-CM | POA: Diagnosis not present

## 2021-12-01 MED ORDER — QUETIAPINE FUMARATE 100 MG PO TABS
100.0000 mg | ORAL_TABLET | Freq: Three times a day (TID) | ORAL | Status: DC | PRN
  Administered 2021-12-06 – 2021-12-14 (×2): 100 mg via ORAL
  Filled 2021-12-01 (×3): qty 1

## 2021-12-01 NOTE — Progress Notes (Signed)
[facility] MD Progress Note  12/01/2021 12:52 PM Jon Clayton  MRN:  [id number]  Subjective:   Jon Clayton is a 26 y.o., male with a past psychiatric history significant for depression, PTSD, substance use who presented to the [facility] from ER for evaluation and management of depression and PTSD initially on 11/11/2021. He was in [facility] for 7 days and discharged on 11/18/2021 to his room in a [facility]. He did not fill his medications because he had a few pills left from his last medications. The next day he went out and put in some new job applications. He got a phone call from one of his previous applications where he was notified that he did not pass his background check due to his sexual offender status and "it all kind of rushed into my head at once" and he became suicidal.  Yesterday the psychiatry team made the following recommendations: -Continue Elavil 25mg  PO QHS. Patient non-compliant with cymbalta due to sexual side effects -Continue Melatonin 5mg  PO QHS -D/c Remeron 7.5mg  PO QHS PRN sleep - dc to reduce polypharmacy  -Start trazodone 50 mg qhs prn for sleep  -Continue Seroquel XL 400mg  PO QHS (or Zydis 10mg  PO QHS if patient unable to keep food down or has purging behaviors)  -Start Seroquel XL 200 mg once daily in the morning -patient reports feeling more irritable, and states that when he was taking Seroquel during the day, during previous admission, this was helpful. -Continue to hold buspar for now   On my exam today, the patient is more calm.  He had just finished discussion with social work team.  He reports that he feels more hopeful, after the shelter housing option in was explained by the to him.  He seems to have more insight into his situation.  He recognizes that he has to do some lab work in order to get the housing and disability benefits that he is looking for.  Patient does have significant mental illness, and should be at  least considered for disability due to this. He reports that his mood is better, less depressed, less angry, less anxious.  He reports that his sleep continues to be okay, with Seroquel.  Reports appetite is okay.  Concentration is better. He denies having any suicidal thoughts today.  This was explored carefully.  Denies having any suicide intent or plan.  He also denies having any homicidal thoughts today, which was explored carefully.  Denies having any homicide intended victim, intent, or plan. He denies having any auditory hallucinations today thus far as to the point of my evaluation.  Denies VH.  Denies other psychotic symptoms including paranoia. Patient reports that restarting Seroquel in the morning, has been helpful for his mood stability and irritability.  He reports feeling calmer without feeling sedated. He denies having any side effects to current scheduled psychiatric medications.  He did receive IM as needed yesterday.  We discussed his awareness of self-control, and needing to ask the nurses for oral as needed medications when his anxiety is increasing at a level that he believes that he is not able to control himself.  He is agreeable to this, and we decided to start Seroquel 100 mg oral as needed, instead of oral Zyprexa, to consolidate his medications and limit polypharmacy, and the patient is agreeable.     Principal Problem: MDD (major depressive disorder), recurrent, severe, with psychosis (HCC) Diagnosis: Principal Problem:   MDD (major  depressive disorder), recurrent, severe, with psychosis (HCC) Active Problems:   PTSD (post-traumatic stress disorder)   Cannabis use disorder   Fibromyalgia   Personality disorder (HCC)   At high risk for self harm   Adult antisocial behavior   Suicidal behavior  Total Time spent with patient: 20 minutes  Past Psychiatric History:  Previous Psychiatric Diagnoses: MDD with psychotic features, PTSD, polysubstance use, dissociative  disorder Current / Past Mental Health Providers: Denies Prior Inpatient or Outpatient Therapy: Reports multiple psychiatric hospitalization at least 7 in the past 12 months in different facilities  Most recently at [facility] 11/18/2021  Past Suicide Attempts: Reports at least 10 times attempted to harm himself last time by cutting his wrist using plastic spoon in May 2022 when he was in jail Past History of Homicidal Behaviors / Violent or Aggressive Behaviors: Denies History of Self-Mutilation: Vaguely reports self cutting but vague about details Previous Participation in PHP/IOP or Residential Programs: Denies Past History of ECT / TMS / VNS: Denies Past Psychotropic Medication Trials: Reports multiple medication treatment  Past Medical History:  Past Medical History:  Diagnosis Date   Depression    Psychosis (HCC)     Past Surgical History:  Procedure Laterality Date   NASAL SINUS SURGERY Bilateral    TONSILLECTOMY     Family History: History reviewed. No pertinent family history.  Family Psychiatric  History:  Reports maternal uncle committed suicide, reports multiple family members with bipolar disorder and schizophrenia    Social History:  Social History   Substance and Sexual Activity  Alcohol Use Not Currently   Comment: reports periodic alcohol use     Social History   Substance and Sexual Activity  Drug Use Yes   Frequency: 1.0 times per week   Types: Marijuana   Comment: 1/3 gm daily    Social History   Socioeconomic History   Marital status: Single    Spouse name: Not on file   Number of children: Not on file   Years of education: Not on file   Highest education level: 8th grade  Occupational History   Not on file  Tobacco Use   Smoking status: Every Day    Packs/day: 1.00    Years: 11.00    Total pack years: 11.00    Types: Cigarettes   Smokeless tobacco: Never  Substance and Sexual Activity   Alcohol use: Not Currently    Comment: reports periodic  alcohol use   Drug use: Yes    Frequency: 1.0 times per week    Types: Marijuana    Comment: 1/3 gm daily   Sexual activity: Not Currently  Other Topics Concern   Not on file  Social History Narrative   Not on file   Social Determinants of Health   Financial Resource Strain: Not on file  Food Insecurity: Not on file  Transportation Needs: Not on file  Physical Activity: Not on file  Stress: Not on file  Social Connections: Not on file   Additional Social History:                         Sleep: Good  Appetite:  Good  Current Medications: Current Facility-Administered Medications  Medication Dose Route Frequency Provider Last Rate Last Admin   acetaminophen (TYLENOL) tablet 650 mg  650 mg Oral Q6H PRN White, Patrice L, NP   650 mg at 12/01/21 0817   alum & mag hydroxide-simeth (MAALOX/MYLANTA) 200-200-20 MG/5ML suspension 30 mL  30 mL Oral Q4H PRN White, Patrice L, NP       amitriptyline (ELAVIL) tablet 25 mg  25 mg Oral QHS Hill, Shelbie HutchingStephanie Leigh, MD   25 mg at 11/30/21 2030   cloNIDine (CATAPRES) tablet 0.1 mg  0.1 mg Oral Q12H White, Patrice L, NP   0.1 mg at 12/01/21 0816   hydrOXYzine (ATARAX) tablet 25 mg  25 mg Oral TID PRN White, Patrice L, NP   25 mg at 12/01/21 0817   magnesium hydroxide (MILK OF MAGNESIA) suspension 30 mL  30 mL Oral Daily PRN White, Patrice L, NP       melatonin tablet 5 mg  5 mg Oral QHS Hill, Shelbie HutchingStephanie Leigh, MD   5 mg at 11/30/21 2030   neomycin-bacitracin-polymyxin (NEOSPORIN) ointment   Topical BID Bobbitt, Shalon E, NP   Given at 11/30/21 1802   nicotine (NICODERM CQ - dosed in mg/24 hours) patch 14 mg  14 mg Transdermal Daily Hill, Shelbie HutchingStephanie Leigh, MD   14 mg at 12/01/21 0817   OLANZapine (ZYPREXA) injection 10 mg  10 mg Intramuscular Q6H PRN Roselle LocusHill, Stephanie Leigh, MD   10 mg at 11/30/21 1251   QUEtiapine (SEROQUEL XR) 24 hr tablet 400 mg  400 mg Oral QPC supper Roselle LocusHill, Stephanie Leigh, MD   400 mg at 11/30/21 1821   Or   OLANZapine  zydis (ZYPREXA) disintegrating tablet 10 mg  10 mg Oral QPC supper Roselle LocusHill, Stephanie Leigh, MD   10 mg at 11/29/21 1550   ondansetron (ZOFRAN-ODT) disintegrating tablet 4 mg  4 mg Oral Q8H PRN Roselle LocusHill, Stephanie Leigh, MD       QUEtiapine (SEROQUEL XR) 24 hr tablet 200 mg  200 mg Oral Daily Rivers Gassmann, MD   200 mg at 12/01/21 19140819   QUEtiapine (SEROQUEL) tablet 100 mg  100 mg Oral TID PRN Phineas InchesMassengill, Sailor Haughn, MD       traZODone (DESYREL) tablet 50 mg  50 mg Oral QHS PRN Phineas InchesMassengill, Viviane Semidey, MD   50 mg at 11/30/21 2030    Lab Results: No results found for this or any previous visit (from the past 48 hour(s)).  Blood Alcohol level:  Lab Results  Component Value Date   ETH <10 11/22/2021   ETH <10 11/04/2021    Metabolic Disorder Labs: Lab Results  Component Value Date   HGBA1C 4.9 11/10/2021   MPG 93.93 11/10/2021   No results found for: "PROLACTIN" Lab Results  Component Value Date   CHOL 111 11/10/2021   TRIG 98 11/10/2021   HDL 36 (L) 11/10/2021   CHOLHDL 3.1 11/10/2021   VLDL 20 11/10/2021   LDLCALC 55 11/10/2021    Physical Findings: AIMS: Facial and Oral Movements Muscles of Facial Expression: None, normal Lips and Perioral Area: None, normal Jaw: None, normal Tongue: None, normal,Extremity Movements Upper (arms, wrists, hands, fingers): None, normal Lower (legs, knees, ankles, toes): None, normal, Trunk Movements Neck, shoulders, hips: None, normal, Overall Severity Severity of abnormal movements (highest score from questions above): None, normal Incapacitation due to abnormal movements: None, normal Patient's awareness of abnormal movements (rate only patient's report): No Awareness, Dental Status Current problems with teeth and/or dentures?: No Does patient usually wear dentures?: No  CIWA:    COWS:     Musculoskeletal: Strength & Muscle Tone: within normal limits Gait & Station: normal Patient leans: N/A  Psychiatric Specialty Exam:  Presentation   General Appearance: Casual  Eye Contact:Good  Speech:Normal Rate  Speech Volume:Normal  Handedness:Right   Mood  and Affect  Mood:Anxious; Dysphoric  Affect:Congruent; Full Range   Thought Process  Thought Processes:Linear; Goal Directed  Descriptions of Associations:Intact  Orientation:Full (Time, Place and Person)  Thought Content:Logical  History of Schizophrenia/Schizoaffective disorder:No  Duration of Psychotic Symptoms:Greater than six months  Hallucinations:Hallucinations: None Description of Auditory Hallucinations: denies having any AH thus far today  Ideas of Reference:None  Suicidal Thoughts:Suicidal Thoughts: No SI Active Intent and/or Plan: With Intent; With Plan  Homicidal Thoughts:Homicidal Thoughts: No HI Active Intent and/or Plan: With Intent; With Plan   Sensorium  Memory:Immediate Fair; Recent Fair; Remote Fair  Judgment:-- (improving)  Insight:-- (improving)   Executive Functions  Concentration:Fair  Attention Span:Fair  Recall:Fair  Progress [employer] of Knowledge:Fair  Language:Fair   Psychomotor Activity  Psychomotor Activity:Psychomotor Activity: Normal   Assets  Assets:Leisure Time   Sleep  Sleep:Sleep: Good    Physical Exam: Physical Exam Vitals reviewed.  Constitutional:      General: He is not in acute distress. Pulmonary:     Effort: Pulmonary effort is normal.  Neurological:     Mental Status: He is alert.    Review of Systems  Psychiatric/Behavioral:  Positive for depression. Negative for hallucinations, substance abuse and suicidal ideas. The patient is nervous/anxious. The patient does not have insomnia.    Blood pressure 110/74, pulse (!) 109, temperature 98.1 F (36.7 C), temperature source Oral, resp. rate 16, height 5\' 7"  (1.702 m), weight 75.3 kg, SpO2 100 %. Body mass index is 26 kg/m.   Treatment Plan Summary: Daily contact with patient to assess and evaluate symptoms and progress in treatment  and Medication management   Assessment: -MDD with psychotic features -Rule out OCD vs (and/or) GAD  -(per history): PTSD, polysubstance use, dissociative disorder   Plan:   Medications: -Continue Elavil 25mg  PO QHS. Patient non-compliant with cymbalta due to sexual side effects -Continue Melatonin 5mg  PO QHS -Continue trazodone 50 mg qhs prn for sleep  -Continue Seroquel XL 400mg  PO QHS (or Zydis 10mg  PO QHS if patient unable to keep food down or has purging behaviors)  -Continue Seroquel XL 200 mg once daily in the morning -patient reports feeling more irritable, and states that when he was taking Seroquel during the day, during previous admission, this was helpful. -Discontinue oral Zyprexa as needed for anxiety and agitation -Start Seroquel 100 mg 3 times daily as needed as needed for anxiety and agitation -Continue Zyprexa 10 mg IM as needed for anxiety and agitation -Continue to hold buspar for now  -Previously discontinued Remeron as needed, to limit polypharmacy   Substance Abuse: brief intervention provided abstinence advised.  Personality disorder:  Firm and consistent boundaries with patient No extraneous items allowed in the room.  Avoiding controlled substances Medical: PRNs for pain, constipation, indigestion available.  Safety and Monitoring: In/voluntary admission to inpatient psychiatric unit for safety, stabilization and treatment Daily contact with patient to assess and evaluate symptoms and progress in treatment Patient's case to be discussed in multi-disciplinary team meeting Observation Level : q15 minute checks Vital signs: q12 hours Precautions: suicide, withdrawal, elopement     , MD 12/01/2021, 12:52 PM  Total Time Spent in Direct Patient Care:  I personally spent 35 minutes on the unit in direct patient care. The direct patient care time included face-to-face time with the patient, reviewing the patient's chart, communicating with  other professionals, and coordinating care. Greater than 50% of this time was spent in counseling or coordinating care with the patient regarding goals of hospitalization,  psycho-education, and discharge planning needs.   Phineas Inches, MD Psychiatrist

## 2021-12-01 NOTE — Progress Notes (Signed)
Nursing 1:1 note D:Pt observed sleeping in bed with eyes closed. RR even and unlabored. No distress noted. A: 1:1 observation continues for safety  R: pt remains safe  

## 2021-12-01 NOTE — Progress Notes (Signed)
Psychoeducational Group Note  Date:  12/01/2021 Time:  2030  Group Topic/Focus:  Wrap-Up Group:   The focus of this group is to help patients review their daily goal of treatment and discuss progress on daily workbooks.  Participation Level: Did Not Attend  Participation Quality:  Not Applicable  Affect:  Not Applicable  Cognitive:  Not Applicable  Insight:  Not Applicable  Engagement in Group: Not Applicable  Additional Comments:  The patient did not attend group this evening.   Roney Youtz S 12/01/2021, 8:30 PM

## 2021-12-01 NOTE — Progress Notes (Signed)
   12/01/21 0500  Sleep  Number of Hours 8.5

## 2021-12-01 NOTE — Progress Notes (Signed)
Nursing 1:1 note  D: Patient observed in his room laying in bed eyes closed resting. Respirations even and unlabored. No distress noted.   A: 1:1 continued for safety    R: Pt remains safe

## 2021-12-01 NOTE — [facility] Counselor (Signed)
CSW met with patient to discuss discharge plans.  Patient discussed barriers and concerns with going to [facility].  CSW answered all questions and patient understood options.    Jon Eichhorn, LCSW, [place] Social Worker  [facility]

## 2021-12-01 NOTE — Progress Notes (Signed)
Pt stated he felt he was doing better and did not understand why he still needed to be on a 1:1 . Pt was educated on his behaviors during the past admission, and the behaviors he exhibited during this admission. Pt informed we have to do what we feel will keep you safe during this admission . Pt encouraged to talk to the doctor, but was advised the doctor is going to do what they feel will keep you safe during this admission.

## 2021-12-01 NOTE — BH IP Treatment Plan (Signed)
Interdisciplinary Treatment and Diagnostic Plan Update  12/01/2021 Time of Session: 10:15am  Jon Clayton MRN: [id number]  Principal Diagnosis: MDD (major depressive disorder), recurrent, severe, with psychosis (HCC)  Secondary Diagnoses: Principal Problem:   MDD (major depressive disorder), recurrent, severe, with psychosis (HCC) Active Problems:   PTSD (post-traumatic stress disorder)   Cannabis use disorder   Fibromyalgia   Personality disorder (HCC)   At high risk for self harm   Adult antisocial behavior   Suicidal behavior   Current Medications:  Current Facility-Administered Medications  Medication Dose Route Frequency Provider Last Rate Last Admin   acetaminophen (TYLENOL) tablet 650 mg  650 mg Oral Q6H PRN White, Patrice L, NP   650 mg at 12/01/21 0817   alum & mag hydroxide-simeth (MAALOX/MYLANTA) 200-200-20 MG/5ML suspension 30 mL  30 mL Oral Q4H PRN White, Patrice L, NP       amitriptyline (ELAVIL) tablet 25 mg  25 mg Oral QHS Hill, Shelbie Hutching, MD   25 mg at 11/30/21 2030   cloNIDine (CATAPRES) tablet 0.1 mg  0.1 mg Oral Q12H White, Patrice L, NP   0.1 mg at 12/01/21 0816   hydrOXYzine (ATARAX) tablet 25 mg  25 mg Oral TID PRN White, Patrice L, NP   25 mg at 12/01/21 0817   magnesium hydroxide (MILK OF MAGNESIA) suspension 30 mL  30 mL Oral Daily PRN White, Patrice L, NP       melatonin tablet 5 mg  5 mg Oral QHS Hill, Shelbie Hutching, MD   5 mg at 11/30/21 2030   neomycin-bacitracin-polymyxin (NEOSPORIN) ointment   Topical BID Bobbitt, Shalon E, NP   Given at 11/30/21 1802   nicotine (NICODERM CQ - dosed in mg/24 hours) patch 14 mg  14 mg Transdermal Daily Hill, Shelbie Hutching, MD   14 mg at 12/01/21 0817   OLANZapine zydis (ZYPREXA) disintegrating tablet 10 mg  10 mg Oral Q6H PRN Roselle Locus, MD   10 mg at 11/29/21 2150   Or   OLANZapine (ZYPREXA) injection 10 mg  10 mg Intramuscular Q6H PRN Roselle Locus, MD   10 mg at 11/30/21 1251    QUEtiapine (SEROQUEL XR) 24 hr tablet 400 mg  400 mg Oral QPC supper Roselle Locus, MD   400 mg at 11/30/21 1821   Or   OLANZapine zydis (ZYPREXA) disintegrating tablet 10 mg  10 mg Oral QPC supper Roselle Locus, MD   10 mg at 11/29/21 1550   ondansetron (ZOFRAN-ODT) disintegrating tablet 4 mg  4 mg Oral Q8H PRN Roselle Locus, MD       QUEtiapine (SEROQUEL XR) 24 hr tablet 200 mg  200 mg Oral Daily Massengill, Nathan, MD   200 mg at 12/01/21 3267   traZODone (DESYREL) tablet 50 mg  50 mg Oral QHS PRN Phineas Inches, MD   50 mg at 11/30/21 2030   PTA Medications: Medications Prior to Admission  Medication Sig Dispense Refill Last Dose   busPIRone (BUSPAR) 15 MG tablet Take 1 tablet (15 mg total) by mouth 2 (two) times daily. 60 tablet 0    cloNIDine (CATAPRES) 0.1 MG tablet Take 1 tablet (0.1 mg total) by mouth every 12 (twelve) hours. 60 tablet 0    DULoxetine (CYMBALTA) 60 MG capsule Take 1 capsule (60 mg total) by mouth daily. 30 capsule 0    hydrOXYzine (ATARAX) 25 MG tablet Take 1 tablet (25 mg total) by mouth 3 (three) times daily as needed for anxiety. 30  tablet 0    neomycin-bacitracin-polymyxin [id number] OINT Apply 1 application. topically daily as needed (apply to excoriation).  0    nicotine (NICODERM CQ - DOSED IN MG/24 HOURS) 21 mg/24hr patch Place 1 patch (21 mg total) onto the skin daily for 28 days. (Patient not taking: Reported on 11/22/2021) 28 patch 0    QUEtiapine (SEROQUEL) 200 MG tablet Take 1 tablet (200 mg total) by mouth 2 (two) times daily. 60 tablet 0    QUEtiapine (SEROQUEL) 400 MG tablet Take 1 tablet (400 mg total) by mouth at bedtime. 30 tablet 0    traZODone (DESYREL) 50 MG tablet Take 1 tablet (50 mg total) by mouth at bedtime as needed for sleep. 30 tablet 0     Patient Stressors: Neurosurgeon issue   Medication change or noncompliance    Patient Strengths: Ability for insight  Capable of independent living   Physical Health   Treatment Modalities: Medication Management, Group therapy, Case management,  1 to 1 session with clinician, Psychoeducation, Recreational therapy.   Physician Treatment Plan for Primary Diagnosis: MDD (major depressive disorder), recurrent, severe, with psychosis (HCC) Long Term Goal(s): Improvement in symptoms so as ready for discharge   Short Term Goals: Ability to identify changes in lifestyle to reduce recurrence of condition will improve Ability to verbalize feelings will improve Ability to disclose and discuss suicidal ideas Ability to demonstrate self-control will improve Ability to identify and develop effective coping behaviors will improve Ability to maintain clinical measurements within normal limits will improve Compliance with prescribed medications will improve Ability to identify triggers associated with substance abuse/mental health issues will improve  Medication Management: Evaluate patient's response, side effects, and tolerance of medication regimen.  Therapeutic Interventions: 1 to 1 sessions, Unit Group sessions and Medication administration.  Evaluation of Outcomes: Progressing  Physician Treatment Plan for Secondary Diagnosis: Principal Problem:   MDD (major depressive disorder), recurrent, severe, with psychosis (HCC) Active Problems:   PTSD (post-traumatic stress disorder)   Cannabis use disorder   Fibromyalgia   Personality disorder (HCC)   At high risk for self harm   Adult antisocial behavior   Suicidal behavior  Long Term Goal(s): Improvement in symptoms so as ready for discharge   Short Term Goals: Ability to identify changes in lifestyle to reduce recurrence of condition will improve Ability to verbalize feelings will improve Ability to disclose and discuss suicidal ideas Ability to demonstrate self-control will improve Ability to identify and develop effective coping behaviors will improve Ability to maintain clinical  measurements within normal limits will improve Compliance with prescribed medications will improve Ability to identify triggers associated with substance abuse/mental health issues will improve     Medication Management: Evaluate patient's response, side effects, and tolerance of medication regimen.  Therapeutic Interventions: 1 to 1 sessions, Unit Group sessions and Medication administration.  Evaluation of Outcomes: Progressing   RN Treatment Plan for Primary Diagnosis: MDD (major depressive disorder), recurrent, severe, with psychosis (HCC) Long Term Goal(s): Knowledge of disease and therapeutic regimen to maintain health will improve  Short Term Goals: Ability to remain free from injury will improve, Ability to participate in decision making will improve, Ability to verbalize feelings will improve, Ability to disclose and discuss suicidal ideas, and Ability to identify and develop effective coping behaviors will improve  Medication Management: RN will administer medications as ordered by provider, will assess and evaluate patient's response and provide education to patient for prescribed medication. RN will report any adverse and/or  side effects to prescribing provider.  Therapeutic Interventions: 1 on 1 counseling sessions, Psychoeducation, Medication administration, Evaluate responses to treatment, Monitor vital signs and CBGs as ordered, Perform/monitor CIWA, COWS, AIMS and Fall Risk screenings as ordered, Perform wound care treatments as ordered.  Evaluation of Outcomes: Progressing   LCSW Treatment Plan for Primary Diagnosis: MDD (major depressive disorder), recurrent, severe, with psychosis (HCC) Long Term Goal(s): Safe transition to appropriate next level of care at discharge, Engage patient in therapeutic group addressing interpersonal concerns.  Short Term Goals: Engage patient in aftercare planning with referrals and resources, Increase social support, Increase emotional  regulation, Facilitate acceptance of mental health diagnosis and concerns, Identify triggers associated with mental health/substance abuse issues, and Increase skills for wellness and recovery  Therapeutic Interventions: Assess for all discharge needs, 1 to 1 time with Social worker, Explore available resources and support systems, Assess for adequacy in community support network, Educate family and significant other(s) on suicide prevention, Complete Psychosocial Assessment, Interpersonal group therapy.  Evaluation of Outcomes: Progressing   Progress in Treatment: Attending groups: No. Participating in groups: No. Taking medication as prescribed: Yes. Toleration medication: Yes. Family/Significant other contact made: No, will contact:  CSW will assess and identify support person Patient understands diagnosis: Yes. Discussing patient identified problems/goals with staff: Yes. Medical problems stabilized or resolved: Yes. Denies suicidal/homicidal ideation: Yes. Issues/concerns per patient self-inventory: No. Other: none        New problem(s) identified: No, Describe:  none   New Short Term/Long Term Goal(s):    medication stabilization, elimination of SI thoughts, development of comprehensive mental wellness plan.      Patient Goals:  medication stabilization, elimination of SI thoughts, development of comprehensive mental wellness plan.      Discharge Plan or Barriers: Patient doe not want to return to sober living home, [facility] [address] Patient recently admitted. CSW will continue to follow and assess for appropriate referrals and possible discharge planning.      Reason for Continuation of Hospitalization: Anxiety Hallucinations Medication stabilization Suicidal ideation   Estimated Length of Stay: 3-7 days  Last 3 [place] Suicide Severity Risk Score: Flowsheet Row [facility] from 11/22/2021 in  [facility] EMERGENC[facility] DEPARTMENT Admission (Discharged) [facility]  C-SSRS RISK CATEGORY High Risk High Risk High Risk       Last PHQ 2/9 Scores:    11/08/2021   11:57 AM 11/05/2021    3:57 AM 11/04/2021    6:30 AM  Depression screen PHQ 2/9  Decreased Interest 2 3 3   Down, Depressed, Hopeless 2 3 3   PHQ - 2 Score 4 6 6   Altered sleeping 3 3 3   Tired, decreased energy 3 3 3   Change in appetite 3 2 3   Feeling bad or failure about yourself  3 3 3   Trouble concentrating 3 2 2   Moving slowly or fidgety/restless 3 2 2   Suicidal thoughts 3 3 3   PHQ-9 Score 25 24 25   Difficult doing work/chores Very difficult Extremely dIfficult Extremely dIfficult    Scribe for Treatment Team: , 12/01/2021 9:08 AM

## 2021-12-01 NOTE — BHH Counselor (Signed)
CSW Chemical engineer, [employer], of [facility] and provided patient name and interest in bed availability.  Sport and exercise psychologist confirmed that patient would be put on waiting list and they would call this social worker or the nursing station with patient code when a bed became available.    CSW will continue to follow up regarding bed availability.    Jalynne Persico, LCSW, LCAS Clincal Social Worker  [facility]

## 2021-12-01 NOTE — Progress Notes (Signed)
   12/01/21 2000  Psych Admission Type (Psych Patients Only)  Admission Status Involuntary  Psychosocial Assessment  Patient Complaints Anxiety  Eye Contact Brief  Facial Expression Anxious  Affect Preoccupied  Speech Tangential  Interaction Childlike  Motor Activity Fidgety  Appearance/Hygiene Unremarkable  Behavior Characteristics Cooperative  Mood Anxious  Aggressive Behavior  Effect No apparent injury  Thought Process  Coherency Circumstantial  Content Preoccupation;Blaming others  Delusions Paranoid  Perception Hallucinations  Hallucination Auditory  Judgment Poor  Confusion None  Danger to Self  Current suicidal ideation? Passive  Self-Injurious Behavior Some self-injurious ideation observed or expressed.  No lethal plan expressed  (pt continues to be on 1:1)  Description of Agreement 1:1

## 2021-12-01 NOTE — Group Note (Signed)
Recreation Therapy Group Note   Group Topic:Leisure Education  Group Date: 12/01/2021 Start Time: 1008 End Time: 1045 Facilitators: Caroll Rancher, LRT,CTRS Location: 500 [address] Dayroom   Goal Area(s) Addresses:  Patient will successfully identify positive leisure and recreation activities.  Patient will acknowledge benefits of participation in healthy leisure activities post discharge.  Patient will actively work with peers toward a shared goal.   Group Description:  Pictionary. In groups of 5-7, patients took turns trying to guess the picture being drawn on the board by their teammate.  If the team guessed the correct answer, they won a point.  If the team guessed wrong, the other team got a chance to steal the point. After several rounds of game play, the team with the most points were declared winners. Post-activity discussion reviewed benefits of positive recreation outlets: reducing stress, improving coping mechanisms, increasing self-esteem, and building larger support systems.   Affect/Mood: N/A   Participation Level: Did not attend    Clinical Observations/Individualized Feedback:     Plan: Continue to engage patient in RT group sessions 2-3x/week.   Caroll Rancher, LRT,CTRS 12/01/2021 1:45 PM

## 2021-12-01 NOTE — Group Note (Signed)
LCSW Group Therapy  Type of Therapy and Topic:  Group Therapy: Thoughts, Feelings, and Actions  Participation Level:  Did Not Attend   Description of Group:   In this group, each patient discussed their previous experiencing and understanding of overthinking, identifying the harmful impact on their lives. As a group, each patient was introduced to the basic concepts of Cognitive Behavioral Therapy: that thoughts, feelings, and actions are all connected and influence one another. They were given examples of how overthinking can affect our feelings, actions, and vise versa. The group was then asked to analyze how overthinking was harmful and brainstorm alternative thinking patterns/reactions to the example situation. Then, each group member filled out and identified their own example situation in which a problem situation caused their thoughts, feelings, and actions to be negatively impacted; they were asked to come up with 3 new (more adaptive/positive) thoughts that led to 3 new feelings and actions.  Therapeutic Goals: Patients will review and discuss their past experience with overthinking. Patients will learn the basics of the CBT model through group-led examples.. Patients will identify situations where they may have negative thoughts, feelings, or actions and will then reframe the situation using more positive thoughts to react differently.  Summary of Patient Progress:  Did not attend  Therapeutic Modalities:   Cognitive Behavioral Therapy Mindfulness  Beatris Si, LCSW 12/01/2021  2:03 PM

## 2021-12-01 NOTE — Progress Notes (Signed)
Pt showered, changed scrubs. Lying in bed awake. Tolerated lunch and fluids well. Did not attend CSW despite multiple prompts. Denies concerns at this time. 1:1 observation maintained without self harm gestures or outburst to note at this time. Assigned staff in attendance at all times.

## 2021-12-01 NOTE — Progress Notes (Signed)
Nursing 1:1 note   D: Patient sitting on bed. Looking out of the window. Calm. Respirations even and unlabored. No self-harm behaviors noted  A: 1:1 observation maintained for safety   R: Pt remains safe

## 2021-12-01 NOTE — Progress Notes (Signed)
   12/01/21 1000  Psych Admission Type (Psych Patients Only)  Admission Status Involuntary  Psychosocial Assessment  Patient Complaints Anxiety  Eye Contact Brief  Facial Expression Anxious  Affect Preoccupied  Speech Tangential  Interaction Childlike  Motor Activity Fidgety  Appearance/Hygiene Unremarkable  Behavior Characteristics Cooperative  Mood Anxious  Aggressive Behavior  Effect No apparent injury  Thought Process  Coherency Circumstantial  Content Preoccupation;Blaming others  Delusions Paranoid  Perception Hallucinations  Hallucination Auditory  Judgment Poor  Confusion None  Danger to Self  Current suicidal ideation? Passive  Self-Injurious Behavior Some self-injurious ideation observed or expressed.  No lethal plan expressed   Agreement Not to Harm Self No  Description of Agreement 1:1  Danger to Others  Danger to Others None reported or observed  Danger to Others Abnormal  Harmful Behavior to others No threats or harm toward other people  Destructive Behavior No threats or harm toward property

## 2021-12-02 DIAGNOSIS — F333 Major depressive disorder, recurrent, severe with psychotic symptoms: Secondary | ICD-10-CM | POA: Diagnosis not present

## 2021-12-02 NOTE — Progress Notes (Signed)
1:1 Note: Patient maintained on constant supervision for safety.  Medication given as prescribed.  Routine safety checks maintained. Patient is safe on the unit with supervision.

## 2021-12-02 NOTE — Progress Notes (Signed)
Nursing 1:1 note D:Pt observed sleeping in bed with eyes closed. RR even and unlabored. No distress noted. A: 1:1 observation continues for safety  R: pt remains safe  

## 2021-12-02 NOTE — BHH Group Notes (Cosign Needed)
The focus of this group is to help patients review their daily goal of treatment and discuss progress on daily workbooks. Pt was attentive and appropriate during tonight's wrap up group. Pt stated that he was able to work on communication. Pt stated that he he has been doing a lot of talking and this is out the norm for him. Pt was able to work on discharge plan.

## 2021-12-02 NOTE — Progress Notes (Signed)
1:1 Note: Patient maintained on constant supervision for safety.  Medication given as prescribed.  Routine safety checks maintained. Still endorsing passive SI.  Patient is safe on the unit with supervision.

## 2021-12-02 NOTE — BHH Counselor (Signed)
CSW provided resources around mental health, shelter and food in the [place] area.    Araf Clugston, LCSW, LCAS Clincal Social Worker  [facility]

## 2021-12-02 NOTE — Progress Notes (Signed)
   12/02/21 0515  Sleep  Number of Hours 8

## 2021-12-02 NOTE — Progress Notes (Signed)
1:1 Note: Patient maintained on constant supervision for safety.  Patient in bed resting.  Refused to attend group when invited.  Medication given as prescribed.  Constant supervision for safety maintained.  Patient is safe on the unit.

## 2021-12-02 NOTE — Progress Notes (Signed)
   12/02/21 2000  Psych Admission Type (Psych Patients Only)  Admission Status Involuntary  Psychosocial Assessment  Patient Complaints Anxiety  Eye Contact Brief  Facial Expression Anxious  Affect Preoccupied  Speech Tangential  Interaction Childlike  Motor Activity Fidgety  Appearance/Hygiene Unremarkable  Behavior Characteristics Cooperative  Mood Anxious  Aggressive Behavior  Effect No apparent injury  Thought Process  Coherency Circumstantial  Content Preoccupation;Blaming others  Delusions Paranoid  Perception Hallucinations  Hallucination Auditory  Judgment Poor  Confusion None  Danger to Self  Current suicidal ideation? Passive  Self-Injurious Behavior Some self-injurious ideation observed or expressed.  No lethal plan expressed  (pt continues to be on 1:1)  Description of Agreement 1:1

## 2021-12-03 ENCOUNTER — Encounter (HOSPITAL_COMMUNITY): Payer: Self-pay

## 2021-12-03 DIAGNOSIS — F333 Major depressive disorder, recurrent, severe with psychotic symptoms: Secondary | ICD-10-CM | POA: Diagnosis not present

## 2021-12-03 NOTE — BHH Group Notes (Signed)

## 2021-12-04 NOTE — BHH Group Notes (Signed)
.  Psychoeducational Group Note  Date 12/04/2020 Time: 0900-1000    Goal Setting   Purpose of Group: Group Focus: affirmation, clarity of thought, and goals/reality orientation Treatment Modality:  Psychoeducation Interventions utilized were assignment, group exercise, and support  Purpose: To be able to understand and verbalize the reason for their admission to the hospital. To understand that the medication helps with their chemical imbalance but they also need to work on their choices in life. To be challenged to develop a list of 30 positives about themselves. Also introduce the concept that "feelings" are not reality.    Participation Level:  did not attend  [employer] Housekeeper

## 2021-12-04 NOTE — Progress Notes (Signed)
Patient is currently being monitored on 1:1 due to recent suicidal gestures and verbalizing thoughts of suicide while in the hospital, impulsive behaviors and command auditory hallucinations.    Offer medications a prescribed, assess patient for safety, engage patient in 1:1 staff talks.    With assistance of 1:1 staff patient has been able to maintain safety.

## 2021-12-05 LAB — GLUCOSE, CAPILLARY: Glucose-Capillary: 189 mg/dL — ABNORMAL HIGH (ref 70–99)

## 2021-12-05 NOTE — Progress Notes (Signed)
Patient is seen in the day room at this time with staff observing him on 1:1, no acute distress noted at this time. Patient denies any pain or discomfort at this time.  Staff will continue to monitor.

## 2021-12-05 NOTE — Plan of Care (Signed)
?  Problem: Coping: Goal: Ability to verbalize frustrations and anger appropriately will improve Outcome: Progressing Goal: Ability to demonstrate self-control will improve Outcome: Progressing   Problem: Activity: Goal: Interest or engagement in activities will improve Outcome: Progressing Goal: Sleeping patterns will improve Outcome: Progressing   

## 2021-12-05 NOTE — Progress Notes (Signed)
1:1 note Pt has been observed sleeping even and easy respiration.No issues noted at this time. Pt remains on 1:1 obs for safety, will continue to monitor.

## 2021-12-05 NOTE — Progress Notes (Signed)
[facility] MD Progress Note  12/05/2021 5:41 PM Jon Clayton  MRN:  [id number]  Subjective:  "I am tired of this one-to-one monitoring. I cannot use the bathroom privately. Always thinking someone is watching me. "  Brief History: Korede Bitler is a 26 y.o., male with a past psychiatric history significant for depression, PTSD, substance use who presented to the [facility] from ER for evaluation and management of depression and PTSD initially on 11/11/2021. He was in [facility] for 7 days and discharged on 11/18/2021 to his [facility] in a [facility]. He did not fill his medications because he had a few pills left from his last medications. The next day he went out and put in some new job applications. He got a phone call from one of his previous applications where he was notified that he did not pass his background check due to his sexual offender status and "it all kind of rushed into my head at once" and he became suicidal.  Yesterday the psychiatry team made the following recommendations: -Continue Elavil 25mg  PO QHS. Patient non-compliant with cymbalta due to sexual side effects -Continue Melatonin 5mg  PO QHS -Continue trazodone 50 mg qhs prn for sleep  -Continue Seroquel XL 400mg  PO QHS (or Zydis 10mg  PO QHS if patient unable to keep food down or has purging behaviors)  -Continue Seroquel XL 200 mg once daily in the morning -patient reports feeling more irritable, and states that when he was taking Seroquel during the day, during previous admission, this was helpful. -Continue Seroquel 100 mg 3 times daily as needed as needed for anxiety and agitation -Continue Zyprexa 10 mg IM as needed for anxiety and agitation -Continue to hold buspar for now  -Previously discontinued Remeron as needed, to limit polypharmacy  On my exam today, patient is assessed in [address], he continues to be calm and cooperative.  Chart is reviewed and findings shared with the treatment team and discussed with  Dr. Mason Jim.  Continues on one-to-one monitoring. Expressed dissatisfaction regarding 1:1 monitoring.Stated, "I feel I do not have any privacy even when using the bathroom." Made patient aware that it was for his own safety. Speech clear and coherent.  Reports less anxiety and rated as 4/10 compared to yesterday which was 7/10 on a scale of 0 to 10, 10 being the worst. Reports no change in depression and continues to rate as 6/10, on a scale of 0-10 with 10 being the highest. Stated that he is not anticipating going to [facility] where he does not know anybody, after discharge from [facility]. Reports that sleep continues to be good and sleeping 9 hours last night and is less irritable.  Reports appetite is good but have to force himself to eat.  Denies weight loss.  Reports good concentration. Denies side effects to current prescribed psychiatric medications. He denies having any suicidal thoughts, or homicidal thoughts. He reports that he did have some auditory /visual hallucinations upon awakening this morning. Seeing "[name]" and hearing "[name]" telling him to do "some undesirable stuff." Reiterated that he is not doing what the voices are telling him.  Per social work, the [facility], which is the only place for the option to be discharged to, due to him being on parole, and being a registered sex offender, does not have any availability of beds at this time.  When a bed opens up, patient can be discharged there.  Until that time, the patient will remain hospitalized, and on  a one-to-one sitter due to the multiple repeated instances of self-harm during this and previous psychiatric hospitalization.    Principal Problem: MDD (major depressive disorder), recurrent, severe, with psychosis (HCC) Diagnosis: Principal Problem:   MDD (major depressive disorder), recurrent, severe, with psychosis (HCC) Active Problems:   PTSD (post-traumatic stress disorder)   Cannabis use disorder   Fibromyalgia    Personality disorder (HCC)   At high risk for self harm   Adult antisocial behavior   Suicidal behavior  Total Time spent with patient: 20 minutes  Past Psychiatric History:  Previous Psychiatric Diagnoses: MDD with psychotic features, PTSD, polysubstance use, dissociative disorder Current / Past Mental Health Providers: Denies Prior Inpatient or Outpatient Therapy: Reports multiple psychiatric hospitalization at least 7 in the past 12 months in different facilities  Most recently at [facility] 11/18/2021  Past Suicide Attempts: Reports at least 10 times attempted to harm himself last time by cutting his wrist using plastic spoon in May 2022 when he was in jail Past History of Homicidal Behaviors / Violent or Aggressive Behaviors: Denies History of Self-Mutilation: Vaguely reports self cutting but vague about details Previous Participation in PHP/IOP or Residential Programs: Denies Past History of ECT / TMS / VNS: Denies Past Psychotropic Medication Trials: Reports multiple medication treatment  Past Medical History:  Past Medical History:  Diagnosis Date   Depression    Psychosis (HCC)     Past Surgical History:  Procedure Laterality Date   NASAL SINUS SURGERY Bilateral    TONSILLECTOMY     Family History: History reviewed. No pertinent family history.  Family Psychiatric  History:  Reports maternal uncle committed suicide, reports multiple family members with bipolar disorder and schizophrenia    Social History:  Social History   Substance and Sexual Activity  Alcohol Use Not Currently   Comment: reports periodic alcohol use     Social History   Substance and Sexual Activity  Drug Use Yes   Frequency: 1.0 times per week   Types: Marijuana   Comment: 1/3 gm daily    Social History   Socioeconomic History   Marital status: Single    Spouse name: Not on file   Number of children: Not on file   Years of education: Not on file   Highest education level: 8th grade   Occupational History   Not on file  Tobacco Use   Smoking status: Every Day    Packs/day: 1.00    Years: 11.00    Total pack years: 11.00    Types: Cigarettes   Smokeless tobacco: Never  Substance and Sexual Activity   Alcohol use: Not Currently    Comment: reports periodic alcohol use   Drug use: Yes    Frequency: 1.0 times per week    Types: Marijuana    Comment: 1/3 gm daily   Sexual activity: Not Currently  Other Topics Concern   Not on file  Social History Narrative   Not on file   Social Determinants of Health   Financial Resource Strain: Not on file  Food Insecurity: Not on file  Transportation Needs: Not on file  Physical Activity: Not on file  Stress: Not on file  Social Connections: Not on file   Additional Social History:   Sleep: Good  Appetite:  Good  Current Medications: Current Facility-Administered Medications  Medication Dose Route Frequency Provider Last Rate Last Admin   acetaminophen (TYLENOL) tablet 650 mg  650 mg Oral Q6H PRN White, Patrice L, NP   650  mg at 12/01/21 0817   alum & mag hydroxide-simeth (MAALOX/MYLANTA) 200-200-20 MG/5ML suspension 30 mL  30 mL Oral Q4H PRN White, Patrice L, NP       amitriptyline (ELAVIL) tablet 25 mg  25 mg Oral QHS Hill, Shelbie Hutching, MD   25 mg at 12/04/21 2135   cloNIDine (CATAPRES) tablet 0.1 mg  0.1 mg Oral Q12H White, Patrice L, NP   0.1 mg at 12/05/21 4540   hydrOXYzine (ATARAX) tablet 25 mg  25 mg Oral TID PRN Liborio Nixon L, NP   25 mg at 12/03/21 2121   magnesium hydroxide (MILK OF MAGNESIA) suspension 30 mL  30 mL Oral Daily PRN White, Patrice L, NP       melatonin tablet 5 mg  5 mg Oral QHS Hill, Shelbie Hutching, MD   5 mg at 12/04/21 2135   neomycin-bacitracin-polymyxin (NEOSPORIN) ointment   Topical BID Bobbitt, Shalon E, NP   Given at 12/05/21 1651   nicotine (NICODERM CQ - dosed in mg/24 hours) patch 14 mg  14 mg Transdermal Daily Roselle Locus, MD   14 mg at 12/05/21 0808    OLANZapine (ZYPREXA) injection 10 mg  10 mg Intramuscular Q6H PRN Roselle Locus, MD   10 mg at 12/04/21 1046   ondansetron (ZOFRAN-ODT) disintegrating tablet 4 mg  4 mg Oral Q8H PRN Roselle Locus, MD       QUEtiapine (SEROQUEL XR) 24 hr tablet 200 mg  200 mg Oral Daily Massengill, Nathan, MD   200 mg at 12/05/21 0805   QUEtiapine (SEROQUEL XR) 24 hr tablet 400 mg  400 mg Oral QPC supper Roselle Locus, MD   400 mg at 12/05/21 1650   QUEtiapine (SEROQUEL) tablet 100 mg  100 mg Oral TID PRN Phineas Inches, MD       traZODone (DESYREL) tablet 50 mg  50 mg Oral QHS PRN Phineas Inches, MD   50 mg at 12/03/21 2121    Lab Results:  Results for orders placed or performed during the hospital encounter of 11/25/21 (from the past 48 hour(s))  Glucose, capillary     Status: Abnormal   Collection Time: 12/05/21  6:07 AM  Result Value Ref Range   Glucose-Capillary 189 (H) 70 - 99 mg/dL    Comment: Glucose reference range applies only to samples taken after fasting for at least 8 hours.    Blood Alcohol level:  Lab Results  Component Value Date   ETH <10 11/22/2021   ETH <10 11/04/2021    Metabolic Disorder Labs: Lab Results  Component Value Date   HGBA1C 4.9 11/10/2021   MPG 93.93 11/10/2021   No results found for: "PROLACTIN" Lab Results  Component Value Date   CHOL 111 11/10/2021   TRIG 98 11/10/2021   HDL 36 (L) 11/10/2021   CHOLHDL 3.1 11/10/2021   VLDL 20 11/10/2021   LDLCALC 55 11/10/2021    Physical Findings: AIMS: Facial and Oral Movements Muscles of Facial Expression: None, normal Lips and Perioral Area: None, normal Jaw: None, normal Tongue: None, normal,Extremity Movements Upper (arms, wrists, hands, fingers): None, normal Lower (legs, knees, ankles, toes): None, normal, Trunk Movements Neck, shoulders, hips: None, normal, Overall Severity Severity of abnormal movements (highest score from questions above): None, normal Incapacitation due  to abnormal movements: None, normal Patient's awareness of abnormal movements (rate only patient's report): No Awareness, Dental Status Current problems with teeth and/or dentures?: No Does patient usually wear dentures?: No  CIWA:  COWS:     Aims score 0 on exam today.  Musculoskeletal: Strength & Muscle Tone: within normal limits Gait & Station: normal Patient leans: N/A  Psychiatric Specialty Exam:  Presentation  General Appearance: Appropriate for Environment; Fairly Groomed; Casual  Eye Contact:Good  Speech:Normal Rate  Speech Volume:Normal  Handedness:Right   Mood and Affect  Mood:Anxious  Affect:Congruent; Depressed   Thought Process  Thought Processes:Linear; Coherent  Descriptions of Associations:Intact  Orientation:Full (Time, Place and Person)  Thought Content:Logical; WDL  History of Schizophrenia/Schizoaffective disorder:Yes  Duration of Psychotic Symptoms:Greater than six months  Hallucinations:Hallucinations:  Denies having any AH at this time.  Reports that do have some AH this morning, that went away.  Ideas of Reference:None Denies  Suicidal Thoughts:Suicidal Thoughts: No SI Active Intent and/or Plan: -- (denies) SI Passive Intent and/or Plan: -- (Denies)  Denies  Homicidal Thoughts:Homicidal Thoughts: No HI Active Intent and/or Plan: -- (no) HI Passive Intent and/or Plan: -- (no)  Denies any homicidal thoughts at this time.  Sensorium  Memory:Immediate Fair; Recent Fair; Remote Fair  Judgment:Fair  Insight:Fair  Executive Functions  Concentration:Good  Attention Span:Good  Recall:Good  Fund of Knowledge:Fair  Language:Good  Psychomotor Activity  Psychomotor Activity:Psychomotor Activity: Normal  Normal  Assets  Assets:Communication Skills; Physical Health  Sleep  Sleep:Sleep: Good Number of Hours of Sleep: 9  Physical Exam: Physical Exam Vitals reviewed.  Constitutional:      General: He is not in  acute distress.    Appearance: Normal appearance.  HENT:     Head: Normocephalic and atraumatic.     Right Ear: External ear normal.     Left Ear: External ear normal.     Mouth/Throat:     Mouth: Mucous membranes are moist.     Pharynx: Oropharynx is clear.  Eyes:     Extraocular Movements: Extraocular movements intact.     Conjunctiva/sclera: Conjunctivae normal.     Pupils: Pupils are equal, round, and reactive to light.  Cardiovascular:     Rate and Rhythm: Normal rate.     Pulses: Normal pulses.  Pulmonary:     Effort: Pulmonary effort is normal.  Abdominal:     Palpations: Abdomen is soft.  Genitourinary:    Comments: deferred Musculoskeletal:        General: Normal range of motion.     Cervical back: Normal range of motion and neck supple.  Skin:    General: Skin is warm.  Neurological:     General: No focal deficit present.     Mental Status: He is alert and oriented to person, place, and time.  Psychiatric:        Behavior: Behavior normal.    Review of Systems  Constitutional: Negative.  Negative for chills and fever.  HENT: Negative.  Negative for hearing loss and tinnitus.   Eyes: Negative.  Negative for blurred vision and double vision.  Respiratory: Negative.  Negative for cough, sputum production, shortness of breath and wheezing.   Cardiovascular: Negative.  Negative for chest pain and palpitations.  Gastrointestinal: Negative.  Negative for heartburn, nausea and vomiting.  Genitourinary: Negative.  Negative for dysuria, frequency and urgency.  Musculoskeletal: Negative.  Negative for back pain, myalgias and neck pain.  Skin: Negative.  Negative for itching and rash.  Neurological: Negative.  Negative for dizziness, tingling, tremors and headaches.  Endo/Heme/Allergies: Negative.  Negative for environmental allergies and polydipsia. Does not bruise/bleed easily.       Pertussis Vaccines Pertussis Vaccines  Other (See  Comments) Not  Specified  07/13/2021 Ped. MD said he was allergic to a medication in the vaccine. Deletion Reason:  Amoxicillin Amoxicillin  Rash Low  07/13/2021 Deletion Reason:  [employer] [Mytilus] [employer] [Mytilus]  Rash Low  11/03/2021 Itchy throat Deletion Reason:  Penicillins Penicillins  Rash Low  07/13/2021    Psychiatric/Behavioral:  Positive for depression, hallucinations, substance abuse and suicidal ideas. The patient is nervous/anxious. The patient does not have insomnia.    Blood pressure 128/81, pulse 93, temperature 98 F (36.7 C), temperature source Oral, resp. rate 16, height 5\' 7"  (1.702 m), weight 75.3 kg, SpO2 100 %. Body mass index is 26 kg/m.   Treatment Plan Summary: Daily contact with patient to assess and evaluate symptoms and progress in treatment and Medication management   Assessment: -MDD with psychotic features -Rule out OCD vs (and/or) GAD  -(per history): PTSD, polysubstance use, dissociative disorder   Plan:   Medications: -Continue Elavil 25mg  PO QHS. Patient non-compliant with cymbalta due to sexual side effects -Continue Melatonin 5mg  PO QHS -Continue trazodone 50 mg qhs prn for sleep  -Continue Seroquel XL 400mg  PO QHS (or Zydis 10mg  PO QHS if patient unable to keep food down or has purging behaviors)  -Continue Seroquel XL 200 mg once daily in the morning -patient reports feeling more irritable, and states that when he was taking Seroquel during the day, during previous admission, this was helpful. -Continue Seroquel 100 mg 3 times daily as needed as needed for anxiety and agitation -Continue Zyprexa 10 mg IM as needed for anxiety and agitation -Continue to hold buspar for now  -Previously discontinued Remeron as needed, to limit polypharmacy   Substance Abuse: brief intervention provided abstinence advised.  Personality disorder:  Firm and consistent boundaries with patient No extraneous items allowed in the room.  Avoiding controlled  substances Medical: PRNs for pain, constipation, indigestion available.  Safety and Monitoring: In/voluntary admission to inpatient psychiatric unit for safety, stabilization and treatment Daily contact with patient to assess and evaluate symptoms and progress in treatment Patient's case to be discussed in multi-disciplinary team meeting Observation Level : 1:1 sitter Vital signs: q12 hours Precautions: suicide, withdrawal, elopement     Cecilie Lowers, FNP 12/05/2021, 5:41 PM Patient ID: [name], male   DOB: 04-28-1996, 26 y.o.   MRN: [id number] Patient ID: Hoke Fromm, male   DOB: Aug 10, 1995, 26 y.o.   MRN: [id number]

## 2021-12-06 DIAGNOSIS — F333 Major depressive disorder, recurrent, severe with psychotic symptoms: Secondary | ICD-10-CM | POA: Diagnosis not present

## 2021-12-06 MED ORDER — QUETIAPINE FUMARATE ER 50 MG PO TB24
100.0000 mg | ORAL_TABLET | Freq: Every day | ORAL | Status: DC
  Administered 2021-12-06 – 2021-12-14 (×9): 100 mg via ORAL
  Filled 2021-12-06 (×10): qty 2

## 2021-12-06 NOTE — Progress Notes (Signed)
1:1 note  Pt observed laying in his bed asleep at the beginning of the shift, pt only woke up for medication sand went right back sleep. Pt is still report passive SI and not contracting for safety. Pt has been a sleep since then, no issues noted, will continue to monitor.

## 2021-12-06 NOTE — Progress Notes (Signed)
   12/05/21 2100  Psych Admission Type (Psych Patients Only)  Admission Status Involuntary  Psychosocial Assessment  Patient Complaints Anxiety  Eye Contact Fair  Facial Expression Animated  Affect Depressed  Speech Logical/coherent  Interaction Childlike  Motor Activity Slow  Appearance/Hygiene Unremarkable  Behavior Characteristics Cooperative  Mood Anxious  Thought Process  Coherency Circumstantial  Content Preoccupation  Delusions Paranoid  Perception Hallucinations  Hallucination Auditory  Judgment Poor  Confusion None  Danger to Self  Current suicidal ideation? Denies  Self-Injurious Behavior No self-injurious ideation or behavior indicators observed or expressed   Agreement Not to Harm Self No  Description of Agreement 1:1  Danger to Others  Danger to Others None reported or observed  Danger to Others Abnormal  Harmful Behavior to others No threats or harm toward other people  Destructive Behavior No threats or harm toward property

## 2021-12-07 DIAGNOSIS — F333 Major depressive disorder, recurrent, severe with psychotic symptoms: Secondary | ICD-10-CM | POA: Diagnosis not present

## 2021-12-07 NOTE — BHH Group Notes (Signed)
Adult Orientation Group Note  Date:  12/07/2021 Time:  2:32 PM  Group Topic/Focus:  Orientation:   The focus of this group is to educate the patient on the purpose and policies of crisis stabilization and provide a format to answer questions about their admission.  The group details unit policies and expectations of patients while admitted.  Participation Level:  Did Not Attend   Thomas Hoff 12/07/2021, 2:32 PM

## 2021-12-07 NOTE — Group Note (Signed)
Recreation Therapy Group Note   Group Topic:Team Building  Group Date: 12/07/2021 Start Time: 1002 End Time: 1030 Facilitators: Caroll Rancher, LRT,CTRS Location: [address]   Goal Area(s) Addresses:  Patient will effectively work with peer towards shared goal.  Patient will identify skills used to make activity successful.  Patient will share challenges and verbalize solution-driven approaches used. Patient will identify how skills used during activity can be used to reach post d/c goals.   Group Description:  [employer]. Patients were provided the following materials: 5 drinking straws, 5 rubber bands, 5 paper clips, 2 index cards and 2 drinking cups. Using the provided materials patients were asked to build a launching mechanism to launch a ping pong ball across the room, approximately 10 feet. Patients were divided into teams of 3-5. Instructions required all materials be incorporated into the device, functionality of items left to the peer group's discretion.   Affect/Mood: N/A   Participation Level: Did not attend    Clinical Observations/Individualized Feedback:     Plan: Continue to engage patient in RT group sessions 2-3x/week.   Caroll Rancher, Antonietta Jewel 12/07/2021 12:45 PM

## 2021-12-07 NOTE — Progress Notes (Signed)
Nursing 1:1 note D:Pt observed sleeping in bed with eyes closed. RR even and unlabored. No distress noted. A: 1:1 observation continues for safety  R: pt remains safe  

## 2021-12-07 NOTE — Progress Notes (Signed)
Adult Psychoeducational Group Note  Date:  12/07/2021 Time:  9:18 PM  Group Topic/Focus:  Wrap-Up Group:   The focus of this group is to help patients review their daily goal of treatment and discuss progress on daily workbooks.  Participation Level:  Did Not Attend  Participation Quality:   Did Not Attend  Affect:  Did Not Attend  Cognitive:  Did Not Attend  Insight: None  Engagement in Group:  Did Not Attend  Modes of Intervention:  Did Not Attend  Additional Comments:   Pt was encouraged to attend wrap up group but did not attend.  Felipa Furnace 12/07/2021, 9:18 PM

## 2021-12-08 ENCOUNTER — Encounter (HOSPITAL_COMMUNITY): Payer: Self-pay

## 2021-12-08 DIAGNOSIS — F333 Major depressive disorder, recurrent, severe with psychotic symptoms: Secondary | ICD-10-CM | POA: Diagnosis not present

## 2021-12-08 NOTE — BHH Counselor (Signed)
CSW spoke with patient PO officer and discussed limitations in discharging patient from the hospital.  PO officer discussed that she would fax over consent for patient to sign so that she can continue working on getting patient into transitional housing.  She reports that in the past some of their clients have had to use [facility] as an address even though it has not technically been cleared with the sherriffs offices.  PO officer has provided permission to use this as a last resort.  She reports that the earliest she may know if he can get in is next Wednesday.  If patient needs to be discharged before that, patient can go to [facility] and he will need to follow up with sheriffs office and probation office within 72 hours of being released.  CSW agreed to follow up and relay information.   At this time, PO officer reports that patient can not go to any other shelters out of county due to being on probation.    Jessamyn Watterson, LCSW, LCAS Clincal Social Worker  [facility]

## 2021-12-08 NOTE — Progress Notes (Signed)
Pt stated he felt better about his situation due to having some viable options    12/08/21 2115  Psych Admission Type (Psych Patients Only)  Admission Status Involuntary  Psychosocial Assessment  Patient Complaints Anxiety  Eye Contact Fair  Facial Expression Sad  Affect Depressed  Speech Logical/coherent  Interaction Childlike;Cautious  Motor Activity Slow  Appearance/Hygiene Disheveled  Behavior Characteristics Cooperative  Mood Depressed;Anxious  Aggressive Behavior  Effect No apparent injury  Thought Process  Coherency Circumstantial  Content Preoccupation  Delusions Paranoid  Perception Hallucinations  Hallucination Auditory  Judgment Poor  Confusion None  Danger to Self  Current suicidal ideation? Denies  Description of Agreement 1:1  Danger to Others  Danger to Others None reported or observed  Danger to Others Abnormal  Harmful Behavior to others No threats or harm toward other people

## 2021-12-08 NOTE — B[facility] Group Notes (Signed)
Patient did not attend morinng orientation group.

## 2021-12-08 NOTE — Progress Notes (Addendum)
Pt currently asleep in bedroom. Pt's respirations are even and unlabored. Pt doesn't appear to be in acute distress. MHT sitter present at bedside. Pt on 1:1 for pt's safety. Pt remains safe on the unit.

## 2021-12-08 NOTE — Group Note (Signed)
[facility] LCSW Group Therapy Note  Date/Time: 12/08/2021 @ 1pm  Type of Therapy and Topic:  Group Therapy:  Communication  Participation Level:  Did not attend  Description of Group:    In this group patients will be encouraged to explore how individuals communicate with one another appropriately and inappropriately. Patients will be guided to discuss their thoughts, feelings, and behaviors related to barriers communicating feelings, needs, and stressors. The group will process together ways to execute positive and appropriate communications, with attention given to how one use behavior, tone, and body language to communicate. Patient will be encouraged to reflect on an incident where they were successfully able to communicate and the factors that they believe helped them to communicate. Each patient will be encouraged to identify specific changes they are motivated to make in order to overcome communication barriers with self, peers, authority, and parents. This group will be process-oriented, with patients participating in exploration of their own experiences as well as giving and receiving support and challenging self as well as other group members.  Therapeutic Goals: Patient will identify how people communicate (body language, facial expression, and electronics) Also discuss tone, voice and how these impact what is communicated and how the message is perceived.  Patient will identify feelings (such as fear or worry), thought process and behaviors related to why people internalize feelings rather than express self openly. Patient will identify two changes they are willing to make to overcome communication barriers. Members will then practice through Role Play how to communicate by utilizing psycho-education material (such as I Feel statements and acknowledging feelings rather than displacing on others)   Summary of Patient Progress: Did not attend     Therapeutic Modalities:   Cognitive  Behavioral Therapy Solution Focused Therapy Motivational Interviewing Family Systems Approach   Jon Kowalchuk, LCSW, LCAS Clincal Social Worker  [facility]

## 2021-12-08 NOTE — Progress Notes (Signed)
Nursing 1:1 note D:Pt observed sleeping in bed with eyes closed. RR even and unlabored. No distress noted. A: 1:1 observation continues for safety  R: pt remains safe  

## 2021-12-08 NOTE — [facility] Counselor (Signed)
CSW called and sent email to [facility] regarding bed availability.  CSW awaiting response.  Last communication was on Monday, 6/26, when shelter director reiterated that shelter only allows residents to stay for 30 days and making sure patient did not need something longer.    Jayah Balthazar, LCSW, LCAS Clincal Social Worker  [facility]

## 2021-12-08 NOTE — Progress Notes (Signed)
   12/08/21 0515  Sleep  Number of Hours 6

## 2021-12-08 NOTE — Progress Notes (Signed)
Nursing 1:1 note D:Pt observed sitting in bed with eyes open. RR even and unlabored. No distress noted. A: 1:1 observation continues for safety  R: pt remains safe  

## 2021-12-08 NOTE — Progress Notes (Signed)
Pt did not attend group. 

## 2021-12-08 NOTE — Progress Notes (Signed)
Pt up in bed with MHT sitter present at bedside. Pt rated depression 0/10, anxiety 7/10, anger 0/10. Pt denied SI/HI/AVH and contracted for safety. Pt stated that he may be going to a halfway house for felons and was having mixed emotions about it. Pt stated that he was worried about not being able to meet some of their requirements. Pt also stated that he feels his medications are working and that he is trying to keep an open-mind. Support and encouragement given to pt. Pt doesn't appear to be in acute distress. Pt on 1:1 for pt's safety. Pt remains safe on the unit.

## 2021-12-08 NOTE — Progress Notes (Signed)
[facility] MD Progress Note  12/08/2021 3:33 PM Jon Clayton  MRN:  [id number]  Subjective:   Jon Clayton is a 26 y.o., male with a past psychiatric history significant for depression, PTSD, substance use who presented to the [facility] from ER for evaluation and management of depression and PTSD initially on 11/11/2021. He was in [facility] for 7 days and discharged on 11/18/2021 to his [facility] in [facility]. He did not fill his medications because he had a few pills left from his last medications. The next day he went out and put in some new job applications. He got a phone call from one of his previous applications where he was notified that he did not pass his background check due to his sexual offender status and "it all kind of rushed into my head at once" and he became suicidal.   Yesterday the psychiatry team made the following recommendations: -Continue Elavil 25mg  PO QHS. Patient non-compliant with cymbalta due to sexual side effects -Continue Melatonin 5mg  PO QHS -Continue trazodone 50 mg qhs prn for sleep  -Continue Seroquel XL 200 mg once daily in the morning  -Continue Seroquel XL 100 mg at 2pm - for anxieyt, mood, and auditory hallucinations.  -Continue Seroquel XL 400mg  QHS   On my exam today, the patient reports that his mood is less dysphoric and irritable initially.  Reports anxiety continues due to ambiguity of her discharge planning.  Reports sleeping okay.  Patient reports that appetite is okay and concentration is okay. He denies having any auditory hallucinations up to this point today.  He denies having any suicidal thoughts today.  Denies having any homicidal thoughts today.  He denies having any other psychotic symptoms. He denies having any adverse effects to current scheduled psychiatric medications.  Today we discussed the possibility of him being discharged in Home, if his parole will allow this.  Per social work, his parole will not  allow him to go outside of .  Will continue to search for other options. We again discussed the patient has only one available discharge option, to a shelter in [facility], due to his parole requirements and also him being on the sex offender registry.    Principal Problem: MDD (major depressive disorder), recurrent, severe, with psychosis (HCC) Diagnosis: Principal Problem:   MDD (major depressive disorder), recurrent, severe, with psychosis (HCC) Active Problems:   PTSD (post-traumatic stress disorder)   Cannabis use disorder   Fibromyalgia   Personality disorder (HCC)   At high risk for self harm   Adult antisocial behavior   Suicidal behavior  Total Time spent with patient: 20 minutes  Past Psychiatric History:  Previous Psychiatric Diagnoses: MDD with psychotic features, PTSD, polysubstance use, dissociative disorder Current / Past Mental Health Providers: Denies Prior Inpatient or Outpatient Therapy: Reports multiple psychiatric hospitalization at least 7 in the past 12 months in different facilities  Most recently at [facility] 11/18/2021  Past Suicide Attempts: Reports at least 10 times attempted to harm himself last time by cutting his wrist using plastic spoon in May 2022 when he was in jail Past History of Homicidal Behaviors / Violent or Aggressive Behaviors: Denies History of Self-Mutilation: Vaguely reports self cutting but vague about details Previous Participation in PHP/IOP or Residential Programs: Denies Past History of ECT / TMS / VNS: Denies Past Psychotropic Medication Trials: Reports multiple medication treatment  Past Medical History:  Past Medical History:  Diagnosis Date   Depression  Psychosis (HCC)     Past Surgical History:  Procedure Laterality Date   NASAL SINUS SURGERY Bilateral    TONSILLECTOMY     Family History: History reviewed. No pertinent family history.  Family Psychiatric  History:  Reports maternal uncle committed suicide,  reports multiple family members with bipolar disorder and schizophrenia     Social History:  Social History   Substance and Sexual Activity  Alcohol Use Not Currently   Comment: reports periodic alcohol use     Social History   Substance and Sexual Activity  Drug Use Yes   Frequency: 1.0 times per week   Types: Marijuana   Comment: 1/3 gm daily    Social History   Socioeconomic History   Marital status: Single    Spouse name: Not on file   Number of children: Not on file   Years of education: Not on file   Highest education level: 8th grade  Occupational History   Not on file  Tobacco Use   Smoking status: Every Day    Packs/day: 1.00    Years: 11.00    Total pack years: 11.00    Types: Cigarettes   Smokeless tobacco: Never  Substance and Sexual Activity   Alcohol use: Not Currently    Comment: reports periodic alcohol use   Drug use: Yes    Frequency: 1.0 times per week    Types: Marijuana    Comment: 1/3 gm daily   Sexual activity: Not Currently  Other Topics Concern   Not on file  Social History Narrative   Not on file   Social Determinants of Health   Financial Resource Strain: Not on file  Food Insecurity: Not on file  Transportation Needs: Not on file  Physical Activity: Not on file  Stress: Not on file  Social Connections: Not on file   Additional Social History:                         Sleep: Fair  Appetite:  Fair  Current Medications: Current Facility-Administered Medications  Medication Dose Route Frequency Provider Last Rate Last Admin   acetaminophen (TYLENOL) tablet 650 mg  650 mg Oral Q6H PRN White, Patrice L, NP   650 mg at 12/01/21 0817   alum & mag hydroxide-simeth (MAALOX/MYLANTA) 200-200-20 MG/5ML suspension 30 mL  30 mL Oral Q4H PRN White, Patrice L, NP       amitriptyline (ELAVIL) tablet 25 mg  25 mg Oral QHS Hill, Shelbie Hutching, MD   25 mg at 12/07/21 2037   cloNIDine (CATAPRES) tablet 0.1 mg  0.1 mg Oral Q12H  White, Patrice L, NP   0.1 mg at 12/08/21 0810   hydrOXYzine (ATARAX) tablet 25 mg  25 mg Oral TID PRN White, Patrice L, NP   25 mg at 12/07/21 2038   magnesium hydroxide (MILK OF MAGNESIA) suspension 30 mL  30 mL Oral Daily PRN White, Patrice L, NP       melatonin tablet 5 mg  5 mg Oral QHS Hill, Shelbie Hutching, MD   5 mg at 12/07/21 2037   neomycin-bacitracin-polymyxin (NEOSPORIN) ointment   Topical BID Bobbitt, Shalon E, NP   Given at 12/07/21 0753   nicotine (NICODERM CQ - dosed in mg/24 hours) patch 14 mg  14 mg Transdermal Daily Hill, Shelbie Hutching, MD   14 mg at 12/08/21 0808   OLANZapine (ZYPREXA) injection 10 mg  10 mg Intramuscular Q6H PRN Roselle Locus, MD  10 mg at 12/04/21 1046   ondansetron (ZOFRAN-ODT) disintegrating tablet 4 mg  4 mg Oral Q8H PRN Roselle Locus, MD       QUEtiapine (SEROQUEL XR) 24 hr tablet 100 mg  100 mg Oral Daily Kelilah Hebard, MD   100 mg at 12/08/21 1420   QUEtiapine (SEROQUEL XR) 24 hr tablet 200 mg  200 mg Oral Daily Bueford Arp, MD   200 mg at 12/08/21 0810   QUEtiapine (SEROQUEL XR) 24 hr tablet 400 mg  400 mg Oral QPC supper Roselle Locus, MD   400 mg at 12/07/21 1844   QUEtiapine (SEROQUEL) tablet 100 mg  100 mg Oral TID PRN Phineas Inches, MD   100 mg at 12/06/21 2055   traZODone (DESYREL) tablet 50 mg  50 mg Oral QHS PRN Phineas Inches, MD   50 mg at 12/07/21 2038    Lab Results:  No results found for this or any previous visit (from the past 48 hour(s)).   Blood Alcohol level:  Lab Results  Component Value Date   ETH <10 11/22/2021   ETH <10 11/04/2021    Metabolic Disorder Labs: Lab Results  Component Value Date   HGBA1C 4.9 11/10/2021   MPG 93.93 11/10/2021   No results found for: "PROLACTIN" Lab Results  Component Value Date   CHOL 111 11/10/2021   TRIG 98 11/10/2021   HDL 36 (L) 11/10/2021   CHOLHDL 3.1 11/10/2021   VLDL 20 11/10/2021   LDLCALC 55 11/10/2021    Physical  Findings: AIMS: Facial and Oral Movements Muscles of Facial Expression: None, normal Lips and Perioral Area: None, normal Jaw: None, normal Tongue: None, normal,Extremity Movements Upper (arms, wrists, hands, fingers): None, normal Lower (legs, knees, ankles, toes): None, normal, Trunk Movements Neck, shoulders, hips: None, normal, Overall Severity Severity of abnormal movements (highest score from questions above): None, normal Incapacitation due to abnormal movements: None, normal Patient's awareness of abnormal movements (rate only patient's report): No Awareness, Dental Status Current problems with teeth and/or dentures?: No Does patient usually wear dentures?: No  CIWA:    COWS:     Musculoskeletal: Strength & Muscle Tone: within normal limits Gait & Station: normal Patient leans: N/A  Psychiatric Specialty Exam:  Presentation  General Appearance: Casual  Eye Contact:Good  Speech:Normal Rate  Speech Volume:Normal  Handedness:Right   Mood and Affect  Mood: Less irritable; Anxious; Dysphoric  Affect:Full Range   Thought Process  Thought Processes:Linear  Descriptions of Associations:Intact  Orientation:Full (Time, Place and Person)  Thought Content:Logical  History of Schizophrenia/Schizoaffective disorder:No  Duration of Psychotic Symptoms:Greater than six months  Hallucinations:Hallucinations: Denies today  Ideas of Reference:None  Suicidal Thoughts: Suicidal Thoughts: Denies  Homicidal Thoughts: Denies  Sensorium  Memory:Immediate Good; Recent Good; Remote Good  Judgment:Fair  Insight:Fair   Executive Functions  Concentration:Fair  Attention Span:Fair  Recall:Good  Fund of Knowledge:Fair  Language:Good   Psychomotor Activity  Psychomotor Activity:No data recorded   Assets  Assets:Communication Skills; Physical Health   Sleep  Sleep:No data recorded Fair   Physical Exam: Physical Exam Vitals reviewed.  Pulmonary:      Effort: Pulmonary effort is normal.  Neurological:     Mental Status: He is alert.     Motor: No weakness.     Gait: Gait normal.  Psychiatric:        Thought Content: Thought content normal.    Review of Systems  Psychiatric/Behavioral:  Negative for depression, hallucinations and suicidal ideas. The patient is nervous/anxious.  Blood pressure 125/80, pulse 98, temperature 98 F (36.7 C), temperature source Oral, resp. rate 16, height 5\' 7"  (1.702 m), weight 75.3 kg, SpO2 96 %. Body mass index is 26 kg/m.   Treatment Plan Summary: Daily contact with patient to assess and evaluate symptoms and progress in treatment and Medication management   Assessment: -MDD with psychotic features (r/o schizoaffective d/o depressive type)  -Rule out OCD vs (and/or) GAD  -(per history): PTSD, polysubstance use, dissociative disorder   Plan:   Medications: -Continue Elavil 25mg  PO QHS. Patient non-compliant with cymbalta due to sexual side effects -Continue Melatonin 5mg  PO QHS -Continue trazodone 50 mg qhs prn for sleep  -Continue Seroquel XL 200 mg once daily in the morning  -Continue Seroquel XL 100 mg at 2pm - for anxiety, mood, and auditory hallucinations.  -Continue Seroquel XL 400mg  QHS   -Continue Seroquel 100 mg 3 times daily as needed as needed for anxiety and agitation -Continue Zyprexa 10 mg IM as needed for anxiety and agitation -Continue to hold buspar for now   -Previously discontinued Remeron as needed, to limit polypharmacy   Substance Abuse: brief intervention provided abstinence advised.  Personality disorder:  Firm and consistent boundaries with patient No extraneous items allowed in the room.  Avoiding controlled substances  Medical: PRNs for pain, constipation, indigestion available.   Safety and Monitoring: In/voluntary admission to inpatient psychiatric unit for safety, stabilization and treatment Daily contact with patient to assess and evaluate  symptoms and progress in treatment Patient's case to be discussed in multi-disciplinary team meeting Observation Level : 1:1 sitter Vital signs: q12 hours Precautions: suicide, withdrawal, elopement    , MD 12/08/2021, 3:33 PM  Total Time Spent in Direct Patient Care:  I personally spent 25 minutes on the unit in direct patient care. The direct patient care time included face-to-face time with the patient, reviewing the patient's chart, communicating with other professionals, and coordinating care. Greater than 50% of this time was spent in counseling or coordinating care with the patient regarding goals of hospitalization, psycho-education, and discharge planning needs.   , MD Psychiatrist

## 2021-12-08 NOTE — Progress Notes (Addendum)
Pt in bedroom laying in bed with MHT sitter present at bedside. Pt's respirations even and unlabored. Pt doesn't appear to be in acute distress. Pt on 1:1 for pt's safety. Pt remains safe on the unit.

## 2021-12-08 NOTE — BH IP Treatment Plan (Unsigned)
Interdisciplinary Treatment and Diagnostic Plan Update  12/08/2021 Time of Session: update Jon Clayton MRN: [id number]  Principal Diagnosis: MDD (major depressive disorder), recurrent, severe, with psychosis (HCC)  Secondary Diagnoses: Principal Problem:   MDD (major depressive disorder), recurrent, severe, with psychosis (HCC) Active Problems:   PTSD (post-traumatic stress disorder)   Cannabis use disorder   Fibromyalgia   Personality disorder (HCC)   At high risk for self harm   Adult antisocial behavior   Suicidal behavior   Current Medications:  Current Facility-Administered Medications  Medication Dose Route Frequency Provider Last Rate Last Admin   acetaminophen (TYLENOL) tablet 650 mg  650 mg Oral Q6H PRN White, Patrice L, NP   650 mg at 12/01/21 0817   alum & mag hydroxide-simeth (MAALOX/MYLANTA) 200-200-20 MG/5ML suspension 30 mL  30 mL Oral Q4H PRN White, Patrice L, NP       amitriptyline (ELAVIL) tablet 25 mg  25 mg Oral QHS Hill, Shelbie Hutching, MD   25 mg at 12/07/21 2037   cloNIDine (CATAPRES) tablet 0.1 mg  0.1 mg Oral Q12H White, Patrice L, NP   0.1 mg at 12/08/21 0810   hydrOXYzine (ATARAX) tablet 25 mg  25 mg Oral TID PRN White, Patrice L, NP   25 mg at 12/07/21 2038   magnesium hydroxide (MILK OF MAGNESIA) suspension 30 mL  30 mL Oral Daily PRN White, Patrice L, NP       melatonin tablet 5 mg  5 mg Oral QHS Hill, Shelbie Hutching, MD   5 mg at 12/07/21 2037   neomycin-bacitracin-polymyxin (NEOSPORIN) ointment   Topical BID Bobbitt, Shalon E, NP   Given at 12/07/21 0753   nicotine (NICODERM CQ - dosed in mg/24 hours) patch 14 mg  14 mg Transdermal Daily Hill, Shelbie Hutching, MD   14 mg at 12/08/21 0808   OLANZapine (ZYPREXA) injection 10 mg  10 mg Intramuscular Q6H PRN Roselle Locus, MD   10 mg at 12/04/21 1046   ondansetron (ZOFRAN-ODT) disintegrating tablet 4 mg  4 mg Oral Q8H PRN Roselle Locus, MD       QUEtiapine (SEROQUEL XR) 24 hr tablet 100  mg  100 mg Oral Daily Massengill, Nathan, MD   100 mg at 12/08/21 1420   QUEtiapine (SEROQUEL XR) 24 hr tablet 200 mg  200 mg Oral Daily Massengill, Nathan, MD   200 mg at 12/08/21 0810   QUEtiapine (SEROQUEL XR) 24 hr tablet 400 mg  400 mg Oral QPC supper Roselle Locus, MD   400 mg at 12/07/21 1844   QUEtiapine (SEROQUEL) tablet 100 mg  100 mg Oral TID PRN Phineas Inches, MD   100 mg at 12/06/21 2055   traZODone (DESYREL) tablet 50 mg  50 mg Oral QHS PRN Massengill, Harrold Donath, MD   50 mg at 12/07/21 2038   PTA Medications: Medications Prior to Admission  Medication Sig Dispense Refill Last Dose   busPIRone (BUSPAR) 15 MG tablet Take 1 tablet (15 mg total) by mouth 2 (two) times daily. 60 tablet 0    cloNIDine (CATAPRES) 0.1 MG tablet Take 1 tablet (0.1 mg total) by mouth every 12 (twelve) hours. 60 tablet 0    DULoxetine (CYMBALTA) 60 MG capsule Take 1 capsule (60 mg total) by mouth daily. 30 capsule 0    hydrOXYzine (ATARAX) 25 MG tablet Take 1 tablet (25 mg total) by mouth 3 (three) times daily as needed for anxiety. 30 tablet 0    neomycin-bacitracin-polymyxin [phone] OINT Apply 1 application.  topically daily as needed (apply to excoriation).  0    nicotine (NICODERM CQ - DOSED IN MG/24 HOURS) 21 mg/24hr patch Place 1 patch (21 mg total) onto the skin daily for 28 days. (Patient not taking: Reported on 11/22/2021) 28 patch 0    QUEtiapine (SEROQUEL) 200 MG tablet Take 1 tablet (200 mg total) by mouth 2 (two) times daily. 60 tablet 0    QUEtiapine (SEROQUEL) 400 MG tablet Take 1 tablet (400 mg total) by mouth at bedtime. 30 tablet 0    traZODone (DESYREL) 50 MG tablet Take 1 tablet (50 mg total) by mouth at bedtime as needed for sleep. 30 tablet 0     Patient Stressors: Soil scientist issue   Medication change or noncompliance    Patient Strengths: Ability for insight  Capable of independent living  Physical Health   Treatment Modalities: Medication  Management, Group therapy, Case management,  1 to 1 session with clinician, Psychoeducation, Recreational therapy.   Physician Treatment Plan for Primary Diagnosis: MDD (major depressive disorder), recurrent, severe, with psychosis ([place]) Long Term Goal(s): Improvement in symptoms so as ready for discharge   Short Term Goals: Ability to identify changes in lifestyle to reduce recurrence of condition will improve Ability to verbalize feelings will improve Ability to disclose and discuss suicidal ideas Ability to demonstrate self-control will improve Ability to identify and develop effective coping behaviors will improve Ability to maintain clinical measurements within normal limits will improve Compliance with prescribed medications will improve Ability to identify triggers associated with substance abuse/mental health issues will improve  Medication Management: Evaluate patient's response, side effects, and tolerance of medication regimen.  Therapeutic Interventions: 1 to 1 sessions, Unit Group sessions and Medication administration.  Evaluation of Outcomes: Adequate for Discharge  Physician Treatment Plan for Secondary Diagnosis: Principal Problem:   MDD (major depressive disorder), recurrent, severe, with psychosis ([place]) Active Problems:   PTSD (post-traumatic stress disorder)   Cannabis use disorder   Fibromyalgia   Personality disorder ([name])   At high risk for self harm   Adult antisocial behavior   Suicidal behavior  Long Term Goal(s): Improvement in symptoms so as ready for discharge   Short Term Goals: Ability to identify changes in lifestyle to reduce recurrence of condition will improve Ability to verbalize feelings will improve Ability to disclose and discuss suicidal ideas Ability to demonstrate self-control will improve Ability to identify and develop effective coping behaviors will improve Ability to maintain clinical measurements within normal limits will  improve Compliance with prescribed medications will improve Ability to identify triggers associated with substance abuse/mental health issues will improve     Medication Management: Evaluate patient's response, side effects, and tolerance of medication regimen.  Therapeutic Interventions: 1 to 1 sessions, Unit Group sessions and Medication administration.  Evaluation of Outcomes: Adequate for Discharge   RN Treatment Plan for Primary Diagnosis: MDD (major depressive disorder), recurrent, severe, with psychosis ([place]) Long Term Goal(s): Knowledge of disease and therapeutic regimen to maintain health will improve  Short Term Goals: Ability to remain free from injury will improve, Ability to verbalize frustration and anger appropriately will improve, Ability to demonstrate self-control, Ability to participate in decision making will improve, Ability to verbalize feelings will improve, Ability to disclose and discuss suicidal ideas, Ability to identify and develop effective coping behaviors will improve, and Compliance with prescribed medications will improve  Medication Management: RN will administer medications as ordered by provider, will assess and evaluate patient's response and provide  education to patient for prescribed medication. RN will report any adverse and/or side effects to prescribing provider.  Therapeutic Interventions: 1 on 1 counseling sessions, Psychoeducation, Medication administration, Evaluate responses to treatment, Monitor vital signs and CBGs as ordered, Perform/monitor CIWA, COWS, AIMS and Fall Risk screenings as ordered, Perform wound care treatments as ordered.  Evaluation of Outcomes: Adequate for Discharge   LCSW Treatment Plan for Primary Diagnosis: MDD (major depressive disorder), recurrent, severe, with psychosis (HCC) Long Term Goal(s): Safe transition to appropriate next level of care at discharge, Engage patient in therapeutic group addressing interpersonal  concerns.  Short Term Goals: Engage patient in aftercare planning with referrals and resources, Increase social support, Increase ability to appropriately verbalize feelings, Increase emotional regulation, Facilitate acceptance of mental health diagnosis and concerns, Facilitate patient progression through stages of change regarding substance use diagnoses and concerns, Identify triggers associated with mental health/substance abuse issues, and Increase skills for wellness and recovery  Therapeutic Interventions: Assess for all discharge needs, 1 to 1 time with Social worker, Explore available resources and support systems, Assess for adequacy in community support network, Educate family and significant other(s) on suicide prevention, Complete Psychosocial Assessment, Interpersonal group therapy.  Evaluation of Outcomes: Adequate for Discharge   Progress in Treatment: Attending groups: No. Participating in groups: No. Taking medication as prescribed: Yes. Toleration medication: Yes. Family/Significant other contact made: Yes, individual(s) contacted:  PO officer Patient understands diagnosis: Yes. Discussing patient identified problems/goals with staff: Yes. Medical problems stabilized or resolved: Yes. Denies suicidal/homicidal ideation: Yes. Issues/concerns per patient self-inventory: No.  New problem(s) identified: No, Describe:  none reported  New Short Term/Long Term Goal(s):   medication stabilization, elimination of SI thoughts, development of comprehensive mental wellness plan.    Patient Goals: Patient continues to work on goals stated in initial treatment team: Medication stabilization, elimination of SI thoughts, development of comprehensive mental wellness plan.     Discharge Plan or Barriers: patient is currently on sex offender registry and cannot live outside of county because he is on probation.  Patient is waiting for bed placement at shelter in [place] or for  transitional housing with PO officer.  Reason for Continuation of Hospitalization: {[facility] Reasons for continued hospitalization:22604}  Estimated Length of Stay: 3-7 days  Last 3 [place] Suicide Severity Risk Score: Flowsheet Row [facility]2021 [facility] Ad[facility] (Discharged[facility]  C-SSRS RISK CATEGORY High Risk High Risk High Risk       Last PHQ 2/9 Scores:    11/08/2021   11:57 AM 11/05/2021    3:57 AM 11/04/2021    6:30 AM  Depression screen PHQ 2/9  Decreased Interest 2 3 3   Down, Depressed, Hopeless 2 3 3   PHQ - 2 Score 4 6 6   Altered sleeping 3 3 3   Tired, decreased energy 3 3 3   Change in appetite 3 2 3   Feeling bad or failure about yourself  3 3 3   Trouble concentrating 3 2 2   Moving slowly or fidgety/restless 3 2 2   Suicidal thoughts 3 3 3   PHQ-9 Score 25 24 25   Difficult doing work/chores Very difficult Extremely dIfficult Extremely dIfficult    Scribe for Treatment Team: , LCSW 12/08/2021 3:46 PM

## 2021-12-09 DIAGNOSIS — F333 Major depressive disorder, recurrent, severe with psychotic symptoms: Secondary | ICD-10-CM | POA: Diagnosis not present

## 2021-12-09 NOTE — BHH Counselor (Signed)
Shelter Landscape architect this CSW and let her know that there will be bed availability at 11am next Wednesday.  CSW asked to hold the bed and they would get patient there.    Reace Breshears, LCSW, LCAS Clincal Social Worker  [facility]

## 2021-12-09 NOTE — Progress Notes (Signed)
[facility] MD Progress Note  12/09/2021 3:45 PM Jon Clayton  MRN:  [id number]  Subjective:   Jon Clayton is a 26 y.o., male with a past psychiatric history significant for depression, PTSD, substance use who presented to the [facility] from ER for evaluation and management of depression and PTSD initially on 11/11/2021. He was in [facility] for 7 days and discharged on 11/18/2021 to [facility]. He did not fill his medications because he had a few pills left from his last medications. The next day he went out and put in some new job applications. He got a phone call from one of his previous applications where he was notified that he did not pass his background check due to his sexual offender status and "it all kind of rushed into my head at once" and he became suicidal.   Yesterday the psychiatry team made the following recommendations: -Continue Elavil 25mg  PO QHS. Patient non-compliant with cymbalta due to sexual side effects -Continue Melatonin 5mg  PO QHS -Continue trazodone 50 mg qhs prn for sleep  -Continue Seroquel XL 200 mg once daily in the morning  -Continue Seroquel XL 100 mg at 2pm - for anxieyt, mood, and auditory hallucinations.  -Continue Seroquel XL 400mg  QHS   On my exam today, the patient reports that his mood is better and less dysphoric and less irritable. Reports some anxiety due to discharge planning.  Reports sleeping okay.  Patient reports that appetite is okay and concentration is okay. He denies having any auditory hallucinations up to this point today.  He denies having any suicidal thoughts today.  Denies having any homicidal thoughts today.  He denies having any other psychotic symptoms. He denies having any adverse effects to current scheduled psychiatric medications.  Today the social worker reports that she received information that the patient can be discharged to the shelter in [facility], as early as next Wednesday at 11 AM.  Patient  is agreeable to this, and looking forward to this.  As discussed in social worker notes, this is the only option for the patient.  He has to remain in [place] due to parole, and this was confirmed with the parole officer yesterday.  Due to being on the sex offender registry, and unable to go back to other places within [facility], this is his only option (outside of knowingly discharging the patient to homelessness).    Principal Problem: MDD (major depressive disorder), recurrent, severe, with psychosis (HCC) Diagnosis: Principal Problem:   MDD (major depressive disorder), recurrent, severe, with psychosis (HCC) Active Problems:   PTSD (post-traumatic stress disorder)   Cannabis use disorder   Fibromyalgia   Personality disorder (HCC)   At high risk for self harm   Adult antisocial behavior   Suicidal behavior  Total Time spent with patient: 20 minutes  Past Psychiatric History:  Previous Psychiatric Diagnoses: MDD with psychotic features, PTSD, polysubstance use, dissociative disorder Current / Past Mental Health Providers: Denies Prior Inpatient or Outpatient Therapy: Reports multiple psychiatric hospitalization at least 7 in the past 12 months in different facilities  Most recently at [facility] 11/18/2021  Past Suicide Attempts: Reports at least 10 times attempted to harm himself last time by cutting his wrist using plastic spoon in May 2022 when he was in jail Past History of Homicidal Behaviors / Violent or Aggressive Behaviors: Denies History of Self-Mutilation: Vaguely reports self cutting but vague about details Previous Participation in PHP/IOP or Residential Programs:  Denies Past History of ECT / TMS / VNS: Denies Past Psychotropic Medication Trials: Reports multiple medication treatment  Past Medical History:  Past Medical History:  Diagnosis Date   Depression    Psychosis (HCC)     Past Surgical History:  Procedure Laterality Date   NASAL SINUS SURGERY  Bilateral    TONSILLECTOMY     Family History: History reviewed. No pertinent family history.  Family Psychiatric  History:  Reports maternal uncle committed suicide, reports multiple family members with bipolar disorder and schizophrenia     Social History:  Social History   Substance and Sexual Activity  Alcohol Use Not Currently   Comment: reports periodic alcohol use     Social History   Substance and Sexual Activity  Drug Use Yes   Frequency: 1.0 times per week   Types: Marijuana   Comment: 1/3 gm daily    Social History   Socioeconomic History   Marital status: Single    Spouse name: Not on file   Number of children: Not on file   Years of education: Not on file   Highest education level: 8th grade  Occupational History   Not on file  Tobacco Use   Smoking status: Every Day    Packs/day: 1.00    Years: 11.00    Total pack years: 11.00    Types: Cigarettes   Smokeless tobacco: Never  Substance and Sexual Activity   Alcohol use: Not Currently    Comment: reports periodic alcohol use   Drug use: Yes    Frequency: 1.0 times per week    Types: Marijuana    Comment: 1/3 gm daily   Sexual activity: Not Currently  Other Topics Concern   Not on file  Social History Narrative   Not on file   Social Determinants of Health   Financial Resource Strain: Not on file  Food Insecurity: Not on file  Transportation Needs: Not on file  Physical Activity: Not on file  Stress: Not on file  Social Connections: Not on file   Additional Social History:                         Sleep: Fair  Appetite:  Fair  Current Medications: Current Facility-Administered Medications  Medication Dose Route Frequency Provider Last Rate Last Admin   acetaminophen (TYLENOL) tablet 650 mg  650 mg Oral Q6H PRN White, Patrice L, NP   650 mg at 12/09/21 0837   alum & mag hydroxide-simeth (MAALOX/MYLANTA) 200-200-20 MG/5ML suspension 30 mL  30 mL Oral Q4H PRN White, Patrice  L, NP       amitriptyline (ELAVIL) tablet 25 mg  25 mg Oral QHS Hill, Shelbie Hutching, MD   25 mg at 12/08/21 2041   cloNIDine (CATAPRES) tablet 0.1 mg  0.1 mg Oral Q12H White, Patrice L, NP   0.1 mg at 12/09/21 0835   hydrOXYzine (ATARAX) tablet 25 mg  25 mg Oral TID PRN White, Patrice L, NP   25 mg at 12/08/21 2041   magnesium hydroxide (MILK OF MAGNESIA) suspension 30 mL  30 mL Oral Daily PRN White, Patrice L, NP       melatonin tablet 5 mg  5 mg Oral QHS Hill, Shelbie Hutching, MD   5 mg at 12/08/21 2041   neomycin-bacitracin-polymyxin (NEOSPORIN) ointment   Topical BID Bobbitt, Shalon E, NP   1 Application at 12/09/21 0835   nicotine (NICODERM CQ - dosed in mg/24 hours) patch  14 mg  14 mg Transdermal Daily Hill, Shelbie Hutching, MD   14 mg at 12/09/21 0840   OLANZapine (ZYPREXA) injection 10 mg  10 mg Intramuscular Q6H PRN Roselle Locus, MD   10 mg at 12/04/21 1046   ondansetron (ZOFRAN-ODT) disintegrating tablet 4 mg  4 mg Oral Q8H PRN Roselle Locus, MD       QUEtiapine (SEROQUEL XR) 24 hr tablet 100 mg  100 mg Oral Daily Brooks Stotz, MD   100 mg at 12/09/21 1423   QUEtiapine (SEROQUEL XR) 24 hr tablet 200 mg  200 mg Oral Daily Wladyslaw Henrichs, Harrold Donath, MD   200 mg at 12/09/21 0835   QUEtiapine (SEROQUEL XR) 24 hr tablet 400 mg  400 mg Oral QPC supper Roselle Locus, MD   400 mg at 12/08/21 1754   QUEtiapine (SEROQUEL) tablet 100 mg  100 mg Oral TID PRN Phineas Inches, MD   100 mg at 12/06/21 2055   traZODone (DESYREL) tablet 50 mg  50 mg Oral QHS PRN Phineas Inches, MD   50 mg at 12/08/21 2041    Lab Results:  No results found for this or any previous visit (from the past 48 hour(s)).   Blood Alcohol level:  Lab Results  Component Value Date   ETH <10 11/22/2021   ETH <10 11/04/2021    Metabolic Disorder Labs: Lab Results  Component Value Date   HGBA1C 4.9 11/10/2021   MPG 93.93 11/10/2021   No results found for: "PROLACTIN" Lab Results   Component Value Date   CHOL 111 11/10/2021   TRIG 98 11/10/2021   HDL 36 (L) 11/10/2021   CHOLHDL 3.1 11/10/2021   VLDL 20 11/10/2021   LDLCALC 55 11/10/2021    Physical Findings: AIMS: Facial and Oral Movements Muscles of Facial Expression: None, normal Lips and Perioral Area: None, normal Jaw: None, normal Tongue: None, normal,Extremity Movements Upper (arms, wrists, hands, fingers): None, normal Lower (legs, knees, ankles, toes): None, normal, Trunk Movements Neck, shoulders, hips: None, normal, Overall Severity Severity of abnormal movements (highest score from questions above): None, normal Incapacitation due to abnormal movements: None, normal Patient's awareness of abnormal movements (rate only patient's report): No Awareness, Dental Status Current problems with teeth and/or dentures?: No Does patient usually wear dentures?: No  CIWA:    COWS:     Musculoskeletal: Strength & Muscle Tone: within normal limits Gait & Station: normal Patient leans: N/A  Psychiatric Specialty Exam:  Presentation  General Appearance: Casual  Eye Contact:Good  Speech:Normal Rate  Speech Volume:Normal  Handedness:Right   Mood and Affect  Mood: Less less depressed, less anxious  Affect:Full Range   Thought Process  Thought Processes:Linear  Descriptions of Associations:Intact  Orientation:Full (Time, Place and Person)  Thought Content:Logical  History of Schizophrenia/Schizoaffective disorder:No  Duration of Psychotic Symptoms:Greater than six months  Hallucinations:Hallucinations: Denies today  Ideas of Reference:None  Suicidal Thoughts: Suicidal Thoughts: Denies  Homicidal Thoughts: Denies  Sensorium  Memory:Immediate Good; Recent Good; Remote Good  Judgment:Fair  Insight:Fair   Executive Functions  Concentration:Fair  Attention Span:Fair  Recall:Good  Fund of Knowledge:Fair  Language:Good   Psychomotor Activity  Psychomotor Activity:No  data recorded   Assets  Assets:Communication Skills; Physical Health   Sleep  Sleep:No data recorded Fair   Physical Exam: Physical Exam Vitals reviewed.  Pulmonary:     Effort: Pulmonary effort is normal.  Neurological:     Mental Status: He is alert.     Motor: No weakness.  Gait: Gait normal.  Psychiatric:        Thought Content: Thought content normal.    Review of Systems  Psychiatric/Behavioral:  Negative for depression, hallucinations and suicidal ideas. The patient is nervous/anxious.    Blood pressure 105/71, pulse 79, temperature 98.2 F (36.8 C), temperature source Oral, resp. rate 16, height 5\' 7"  (1.702 m), weight 75.3 kg, SpO2 98 %. Body mass index is 26 kg/m.   Treatment Plan Summary: Daily contact with patient to assess and evaluate symptoms and progress in treatment and Medication management   Assessment: -MDD with psychotic features (r/o schizoaffective d/o depressive type)  -Rule out OCD vs (and/or) GAD  -(per history): PTSD, polysubstance use, dissociative disorder   Plan:   Medications: -Continue Elavil 25mg  PO QHS. Patient non-compliant with cymbalta due to sexual side effects -Continue Melatonin 5mg  PO QHS -Continue trazodone 50 mg qhs prn for sleep  -Continue Seroquel XL 200 mg once daily in the morning  -Continue Seroquel XL 100 mg at 2pm - for anxiety, mood, and auditory hallucinations.  -Continue Seroquel XL 400mg  QHS   -Continue Seroquel 100 mg 3 times daily as needed as needed for anxiety and agitation -Continue Zyprexa 10 mg IM as needed for anxiety and agitation -Continue to hold buspar for now   -Previously discontinued Remeron as needed, to limit polypharmacy   Substance Abuse: brief intervention provided abstinence advised.  Personality disorder:  Firm and consistent boundaries with patient No extraneous items allowed in the room.  Avoiding controlled substances  Medical: PRNs for pain, constipation, indigestion  available.   Safety and Monitoring: In/voluntary admission to inpatient psychiatric unit for safety, stabilization and treatment Daily contact with patient to assess and evaluate symptoms and progress in treatment Patient's case to be discussed in multi-disciplinary team meeting Observation Level : 1:1 sitter Vital signs: q12 hours Precautions: suicide, withdrawal, elopement    , MD 12/09/2021, 3:45 PM  Total Time Spent in Direct Patient Care:  I personally spent 25 minutes on the unit in direct patient care. The direct patient care time included face-to-face time with the patient, reviewing the patient's chart, communicating with other professionals, and coordinating care. Greater than 50% of this time was spent in counseling or coordinating care with the patient regarding goals of hospitalization, psycho-education, and discharge planning needs.   , MD Psychiatrist

## 2021-12-09 NOTE — Progress Notes (Signed)
Nursing 1:1 note D:Pt observed sleeping in bed with eyes closed. RR even and unlabored. No distress noted. A: 1:1 observation continues for safety  R: pt remains safe  

## 2021-12-09 NOTE — [facility] Counselor (Addendum)
CSW received a message from patient PO officer.  She reports that patient is unable to go to the [facility].  Patient also has been declined from transitional housing due to mental health and history.  Patient currently has no place to go except for [facility], [employer].  CSW has not received response from shelter about bed openings after several attempts for calling and emailing the Sport and exercise psychologist.    CSW contacted Shelly Coss to discuss places sex offenders can go.  CSW received a list but all are out of county.  CSW sent list to PO officer to see if they would grant for him to go to any of those places.  CSW waiting for response from PO officer.   Addendum: CSW received an email back from PO officer, [name]: "Unfortunately, with shelters most counties will reject the investigation. Also, for registered sex offenders, an investigation has to be sent to the other county and approved prior to the client moving. This process can take up to 10 days. Either me or I may ask my supervisor today, will call [facility] to see if we can get him a bed or at least get an update. I have also found a  non-state funded transitional housing option which I will be going over with him upon his release. With this type of housing, they help secure a job, but he will be required to pay rent. It's similar to the house he was living in prior, it does seem to be better managed so far, and the people managing seem to be more involved and caring. Again, it's unfortunately nothing I can get him into in this very moment due to our investigation process. I am going to try to brainstorm with a coworker for a "quick fix" to see if she has any ideas on where he can go temporarily, and I will let you know if we come up with anything. Thank you so much for being so helpful and keeping my updated. I really appreciate it. " CSW will continue to update for placement.    Jon Zellman, LCSW, LCAS Clincal Social Worker  [facility]

## 2021-12-09 NOTE — [facility] Counselor (Signed)
CSW received updated email from patient PO officer: Email response below:    "I understand, at this point it sounds like he is just going to have to come out as homeless, and you can give our address and release him to [place]. If he is released next Wednesday I will be in the office at 1 pm on this day. If he is released prior, can you please remind him of the 72 hour window to report to probation and the sheriff's (excluding holidays and weekends). "   CSW also received email from shelter director regarding updates on bed availability.  "No update yet, can you bring him in next week Wednesday? If you haven't found a place for him already."   [employer], LCSW, LCAS Clincal Social Worker  [facility]

## 2021-12-09 NOTE — Progress Notes (Signed)
Pt awake, conversing with provider and CSW in hall at this time. Isolative in room earlier this shift, encouraged to join peers in dayroom for groups and leisure time and was in agreement. Attended scheduled nutrition group and was active. Emotional support, encouragement and reassurance provided to pt. 1:1 observation maintained without incident. Pt remains medication compliant. Tolerated lunch and fluids well. Safety maintained in milieu.

## 2021-12-09 NOTE — Progress Notes (Signed)
The focus of this group is to help patients review their daily goal of treatment and discuss progress on daily workbooks.  Pt attended the evening group and responded to all discussion prompts from the Writer. Pt shared that today was a good day on the unit, the highlight of which was learning of progress concerning his discharge plans. "It's good to finally know. 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 "I'll not only have a clearer mind, but I'll also have more money."  Pt rated his day an 8 out of 10 and his affect was appropriate.

## 2021-12-09 NOTE — Group Note (Addendum)
Recreation Therapy Group Note   Group Topic:Self-Esteem  Group Date: 12/09/2021 Start Time: 1005 End Time: 1045 Facilitators: Caroll Rancher, LRT,CTRS Location: [address] Dayroom   Goal Area(s) Addresses:  Patient will identify and write at least one positive trait about themself. Patient will successfully identify influential people in their life and why they admire them. Patient will acknowledge the benefit of healthy self-esteem. Patient will endorse understanding of ways to increase self-esteem.   Group Description:   LRT began group session with open dialogue asking the patients to define self-esteem and verbally identify positive qualities and traits people may possess. Patients were then instructed to design a personalized license plate, with words and drawings, representing at least 3 positive things about themselves. Pts were encouraged to include favorites, things they are proud of, what they enjoy doing, and dreams for their future. If a patient had a life motto or a meaningful phase that expressed their life values, pt's were asked to incorporate that into their design as well. Patients were given the opportunity to share their completed work with the group.   Affect/Mood: Appropriate   Participation Level: Engaged   Participation Quality: Independent   Behavior: Appropriate   Speech/Thought Process: Focused   Insight: Good   Judgement: Good   Modes of Intervention: Art   Patient Response to Interventions:  Engaged   Education Outcome:  Acknowledges education and In group clarification offered    Clinical Observations/Individualized Feedback: Pt was appropriate and engaged in group.  Pt expressed self esteem was "how you feel about yourself".  Pt identified video games as something he likes.  Pt expressed Mortal Kombat 11 was his favorite game.  Pt went on to say he ranked up to number 9 in the world with Mortal Kombat and [employer] was his favorite Editor, commissioning.  JP  is his nickname.  Pt described himself as loyal, friendly until you give him a reason not to be and he did an Systems developer competition and his cake was displayed in the mall.  Pt also felt it was easier to compliment other people and not yourself.   Plan: Continue to engage patient in RT group sessions 2-3x/week.   Caroll Rancher, Antonietta Jewel 12/09/2021 12:45 PM

## 2021-12-09 NOTE — Progress Notes (Signed)
   12/09/21 2045  Psych Admission Type (Psych Patients Only)  Admission Status Involuntary  Psychosocial Assessment  Patient Complaints Anxiety  Eye Contact Fair  Facial Expression Sad  Affect Depressed  Speech Logical/coherent  Interaction Childlike;Cautious  Motor Activity Slow  Appearance/Hygiene Disheveled  Behavior Characteristics Cooperative  Mood Depressed;Anxious  Aggressive Behavior  Effect No apparent injury  Thought Process  Coherency Circumstantial  Content Preoccupation  Delusions Paranoid  Perception Hallucinations  Hallucination Auditory  Judgment Poor  Confusion None  Danger to Self  Current suicidal ideation? Denies  Description of Agreement 1:1  Danger to Others  Danger to Others None reported or observed  Danger to Others Abnormal  Harmful Behavior to others No threats or harm toward other people

## 2021-12-09 NOTE — Progress Notes (Addendum)
Pt A & O X4. Presents with flat affect, depressed and irritable on initial interactions. Denies SI, HI and AVH when assessed "Not right now". Remains medication compliant without adverse drug reactions. Continues to voice concerns about placement; concerns validated. Received PRN Tylenol 650 mg PO at 0837 for complain of headache 5/10 and reported relief when reassessed at 0930. Emotional support, encouragement and reassurance provided to pt. 1:1 observation maintained without incident. Pt tolerates breakfast and fluids well. Cooperative with care at this time.

## 2021-12-09 NOTE — Progress Notes (Addendum)
Pt visible in dayroom this evening. Eating dinner in room with assigned MHT staff in attendance. Compliant with medications without discomfort. 1:1 observation maintained for safety without self harm gestures or outburst to note thus far.

## 2021-12-10 NOTE — Progress Notes (Signed)
Nursing 1:1 note D:Pt observed sitting in bed with eyes open. RR even and unlabored. No distress noted. A: 1:1 observation continues for safety  R: pt remains safe  

## 2021-12-10 NOTE — Progress Notes (Signed)
Pt asleep in bed, respirations noted and unlabored. Assigned MHT staff in attendance at all times. 1:1 observation maintained without incident. He tolerated lunch and fluids well. Took his scheduled noon medication without issue. However, pt declined scheduled noon groups despite multiple prompts. Safety maintained.

## 2021-12-10 NOTE — Progress Notes (Signed)
Nursing 1:1 note D:Pt observed sleeping in bed with eyes closed. RR even and unlabored. No distress noted. A: 1:1 observation continues for safety  R: pt remains safe  

## 2021-12-10 NOTE — Progress Notes (Signed)
Pt visible in dayroom watching a movie with peers. Observed to be calm, interactive with peers and staff appropriately. Compliant with scheduled medications, denies adverse drug reactions when assessed. 1:1 observation maintained without self harm gestures or outburst.

## 2021-12-10 NOTE — Progress Notes (Signed)
   12/10/21 0445  Sleep  Number of Hours 7.5

## 2021-12-10 NOTE — Progress Notes (Signed)
   12/10/21 2015  Psych Admission Type (Psych Patients Only)  Admission Status Involuntary  Psychosocial Assessment  Patient Complaints Anxiety;Depression  Eye Contact Fair  Facial Expression Sad  Affect Depressed  Speech Logical/coherent  Interaction Childlike;Cautious  Motor Activity Slow  Appearance/Hygiene Disheveled  Behavior Characteristics Cooperative  Mood Depressed;Anxious  Aggressive Behavior  Effect No apparent injury  Thought Process  Coherency Circumstantial  Content Preoccupation  Delusions Paranoid  Perception Hallucinations  Hallucination Auditory  Judgment Poor  Confusion None  Danger to Self  Current suicidal ideation? Denies  Description of Agreement 1:1  Danger to Others  Danger to Others None reported or observed  Danger to Others Abnormal  Harmful Behavior to others No threats or harm toward other people

## 2021-12-10 NOTE — [facility] Group Notes (Signed)
Spirituality group facilitated by Chaplain Katy Felipe Cabell, BCC.   Group Description: Group focused on topic of hope. Patients participated in facilitated discussion around topic, connecting with one another around experiences and definitions for hope. Group members engaged with visual explorer photos, reflecting on what hope looks like for them today. Group engaged in discussion around how their definitions of hope are present today in hospital.   Modalities: Psycho-social ed, Adlerian, Narrative, MI   Patient Progress: Did not attend.  

## 2021-12-10 NOTE — Progress Notes (Signed)
Adult Psychoeducational Group Note  Date:  12/10/2021 Time:  8:18 PM  Group Topic/Focus:  Wrap-Up Group:   The focus of this group is to help patients review their daily goal of treatment and discuss progress on daily workbooks.  Participation Level:  Active  Participation Quality:  Appropriate and Attentive  Affect:  Appropriate  Cognitive:  Alert and Appropriate  Insight: Appropriate  Engagement in Group:  Engaged  Modes of Intervention:  Discussion  Additional Comments:   Pt was engaged during group discussion. Pt states that's he had a decent day but has been dealing with flashbacks and has had a few spells of AVH. Pt was encouraged to notify his nurse at [facility]. Pt endorses fleeting feelings of anxiety. One of his goals was to communicate with nursing staff more affectively and to be less embarrassed about seeking help.  Jon Clayton 12/10/2021, 8:18 PM

## 2021-12-10 NOTE — Group Note (Signed)
[facility] LCSW Group Therapy Note  Date/Time: 11am on 6/30  Type of Therapy and Topic:  Group Therapy:  Who Am I?  Self Esteem, Self-Actualization and Understanding Self.  Participation Level:  Did not attend  Description of Group:    In this group patients will be asked to explore values, beliefs, truths, and morals as they relate to personal self.  Patients will be guided to discuss their thoughts, feelings, and behaviors related to what they identify as important to their true self. Patients will process together how values, beliefs and truths are connected to specific choices patients make every day. Each patient will be challenged to identify changes that they are motivated to make in order to improve self-esteem and self-actualization. This group will be process-oriented, with patients participating in exploration of their own experiences as well as giving and receiving support and challenge from other group members.  Therapeutic Goals: Patient will identify false beliefs that currently interfere with their self-esteem.  Patient will identify feelings, thought process, and behaviors related to self and will become aware of the uniqueness of themselves and of others.  Patient will be able to identify and verbalize values, morals, and beliefs as they relate to self. Patient will begin to learn how to build self-esteem/self-awareness by expressing what is important and unique to them personally.  Summary of Patient Progress: Did not attend             Therapeutic Modalities:   Cognitive Behavioral Therapy Solution Focused Therapy Motivational Interviewing Brief Therapy   Shakai Dolley, LCSW, LCAS Clincal Social Worker  [facility]

## 2021-12-10 NOTE — Progress Notes (Signed)
[facility] MD Progress Note  12/10/2021 12:03 PM Jon Clayton  MRN:  [id number]  Subjective:   Jon Clayton is a 26 y.o., male with a past psychiatric history significant for depression, PTSD, substance use who presented to the [facility] from ER for evaluation and management of depression and PTSD initially on 11/11/2021. He was in [facility] for 7 days and discharged on 11/18/2021 to his room in a [facility]. He did not fill his medications because he had a few pills left from his last medications. The next day he went out and put in some new job applications. He got a phone call from one of his previous applications where he was notified that he did not pass his background check due to his sexual offender status and "it all kind of rushed into my head at once" and he became suicidal.   Yesterday the psychiatry team made the following recommendations: -Continue Elavil 25mg  PO QHS. Patient non-compliant with cymbalta due to sexual side effects -Continue Melatonin 5mg  PO QHS -Continue trazodone 50 mg qhs prn for sleep  -Continue Seroquel XL 200 mg once daily in the morning  -Continue Seroquel XL 100 mg at 2pm - for anxieyt, mood, and auditory hallucinations.  -Continue Seroquel XL 400mg  QHS   On my exam today, the patient reports that his mood is "fine" denies feeling down, depressed, or sad. Reports some anxiety due to discharge planning.  Reports sleeping okay.  Patient reports that appetite is okay and concentration is okay. He denies having any auditory hallucinations up to this point today.  He denies having any suicidal thoughts today.  Denies having any homicidal thoughts today.  He denies having any other psychotic symptoms. He denies having any adverse effects to current scheduled psychiatric medications.  Yesterday, the social worker reported that she received information that the patient can be discharged to the shelter in [facility], as early as next Wednesday at 11 AM.   Patient is agreeable to this, and looking forward to this.  As discussed in social worker notes, this is the only option for the patient.  He has to remain in [place] due to parole, and this was confirmed with the parole officer yesterday.  Due to being on the sex offender registry, and unable to go back to other places within [facility], this is his only option (outside of knowingly discharging the patient to homelessness).    Principal Problem: MDD (major depressive disorder), recurrent, severe, with psychosis (HCC) Diagnosis: Principal Problem:   MDD (major depressive disorder), recurrent, severe, with psychosis (HCC) Active Problems:   PTSD (post-traumatic stress disorder)   Cannabis use disorder   Fibromyalgia   Personality disorder (HCC)   At high risk for self harm   Adult antisocial behavior   Suicidal behavior  Total Time spent with patient: 20 minutes  Past Psychiatric History:  Previous Psychiatric Diagnoses: MDD with psychotic features, PTSD, polysubstance use, dissociative disorder Current / Past Mental Health Providers: Denies Prior Inpatient or Outpatient Therapy: Reports multiple psychiatric hospitalization at least 7 in the past 12 months in different facilities  Most recently at [facility] 11/18/2021  Past Suicide Attempts: Reports at least 10 times attempted to harm himself last time by cutting his wrist using plastic spoon in May 2022 when he was in jail Past History of Homicidal Behaviors / Violent or Aggressive Behaviors: Denies History of Self-Mutilation: Vaguely reports self cutting but vague about details Previous Participation in PHP/IOP or Residential Programs:  Denies Past History of ECT / TMS / VNS: Denies Past Psychotropic Medication Trials: Reports multiple medication treatment  Past Medical History:  Past Medical History:  Diagnosis Date   Depression    Psychosis (HCC)     Past Surgical History:  Procedure Laterality Date   NASAL SINUS SURGERY  Bilateral    TONSILLECTOMY     Family History: History reviewed. No pertinent family history.  Family Psychiatric  History:  Reports maternal uncle committed suicide, reports multiple family members with bipolar disorder and schizophrenia     Social History:  Social History   Substance and Sexual Activity  Alcohol Use Not Currently   Comment: reports periodic alcohol use     Social History   Substance and Sexual Activity  Drug Use Yes   Frequency: 1.0 times per week   Types: Marijuana   Comment: 1/3 gm daily    Social History   Socioeconomic History   Marital status: Single    Spouse name: Not on file   Number of children: Not on file   Years of education: Not on file   Highest education level: 8th grade  Occupational History   Not on file  Tobacco Use   Smoking status: Every Day    Packs/day: 1.00    Years: 11.00    Total pack years: 11.00    Types: Cigarettes   Smokeless tobacco: Never  Substance and Sexual Activity   Alcohol use: Not Currently    Comment: reports periodic alcohol use   Drug use: Yes    Frequency: 1.0 times per week    Types: Marijuana    Comment: 1/3 gm daily   Sexual activity: Not Currently  Other Topics Concern   Not on file  Social History Narrative   Not on file   Social Determinants of Health   Financial Resource Strain: Not on file  Food Insecurity: Not on file  Transportation Needs: Not on file  Physical Activity: Not on file  Stress: Not on file  Social Connections: Not on file   Additional Social History:                         Sleep: Fair  Appetite:  Fair  Current Medications: Current Facility-Administered Medications  Medication Dose Route Frequency Provider Last Rate Last Admin   acetaminophen (TYLENOL) tablet 650 mg  650 mg Oral Q6H PRN White, Patrice L, NP   650 mg at 12/09/21 0837   alum & mag hydroxide-simeth (MAALOX/MYLANTA) 200-200-20 MG/5ML suspension 30 mL  30 mL Oral Q4H PRN White, Patrice  L, NP       amitriptyline (ELAVIL) tablet 25 mg  25 mg Oral QHS Hill, Shelbie Hutching, MD   25 mg at 12/09/21 2037   cloNIDine (CATAPRES) tablet 0.1 mg  0.1 mg Oral Q12H White, Patrice L, NP   0.1 mg at 12/10/21 0730   hydrOXYzine (ATARAX) tablet 25 mg  25 mg Oral TID PRN White, Patrice L, NP   25 mg at 12/09/21 2037   magnesium hydroxide (MILK OF MAGNESIA) suspension 30 mL  30 mL Oral Daily PRN White, Patrice L, NP       melatonin tablet 5 mg  5 mg Oral QHS Hill, Shelbie Hutching, MD   5 mg at 12/09/21 2037   neomycin-bacitracin-polymyxin (NEOSPORIN) ointment   Topical BID Bobbitt, Shalon E, NP   1 Application at 12/09/21 1622   nicotine (NICODERM CQ - dosed in mg/24 hours) patch  14 mg  14 mg Transdermal Daily Hill, Shelbie Hutching, MD   14 mg at 12/10/21 0733   OLANZapine (ZYPREXA) injection 10 mg  10 mg Intramuscular Q6H PRN Roselle Locus, MD   10 mg at 12/04/21 1046   ondansetron (ZOFRAN-ODT) disintegrating tablet 4 mg  4 mg Oral Q8H PRN Roselle Locus, MD       QUEtiapine (SEROQUEL XR) 24 hr tablet 100 mg  100 mg Oral Daily Julia Kulzer, MD   100 mg at 12/09/21 1423   QUEtiapine (SEROQUEL XR) 24 hr tablet 200 mg  200 mg Oral Daily Arth Nicastro, MD   200 mg at 12/10/21 0730   QUEtiapine (SEROQUEL XR) 24 hr tablet 400 mg  400 mg Oral QPC supper Roselle Locus, MD   400 mg at 12/09/21 1745   QUEtiapine (SEROQUEL) tablet 100 mg  100 mg Oral TID PRN Phineas Inches, MD   100 mg at 12/06/21 2055   traZODone (DESYREL) tablet 50 mg  50 mg Oral QHS PRN Phineas Inches, MD   50 mg at 12/09/21 2037    Lab Results:  No results found for this or any previous visit (from the past 48 hour(s)).   Blood Alcohol level:  Lab Results  Component Value Date   ETH <10 11/22/2021   ETH <10 11/04/2021    Metabolic Disorder Labs: Lab Results  Component Value Date   HGBA1C 4.9 11/10/2021   MPG 93.93 11/10/2021   No results found for: "PROLACTIN" Lab Results   Component Value Date   CHOL 111 11/10/2021   TRIG 98 11/10/2021   HDL 36 (L) 11/10/2021   CHOLHDL 3.1 11/10/2021   VLDL 20 11/10/2021   LDLCALC 55 11/10/2021    Physical Findings: AIMS: Facial and Oral Movements Muscles of Facial Expression: None, normal Lips and Perioral Area: None, normal Jaw: None, normal Tongue: None, normal,Extremity Movements Upper (arms, wrists, hands, fingers): None, normal Lower (legs, knees, ankles, toes): None, normal, Trunk Movements Neck, shoulders, hips: None, normal, Overall Severity Severity of abnormal movements (highest score from questions above): None, normal Incapacitation due to abnormal movements: None, normal Patient's awareness of abnormal movements (rate only patient's report): No Awareness, Dental Status Current problems with teeth and/or dentures?: No Does patient usually wear dentures?: No  CIWA:    COWS:     Musculoskeletal: Strength & Muscle Tone: within normal limits Gait & Station: normal Patient leans: N/A  Psychiatric Specialty Exam:  Presentation  General Appearance: Casual  Eye Contact:Good  Speech:Normal Rate  Speech Volume:Normal  Handedness:Right   Mood and Affect  Mood: Less depressed, less anxious  Affect:Full Range   Thought Process  Thought Processes:Linear  Descriptions of Associations:Intact  Orientation:Full (Time, Place and Person)  Thought Content:Logical  History of Schizophrenia/Schizoaffective disorder:No  Duration of Psychotic Symptoms:Greater than six months  Hallucinations:Hallucinations: Denies today  Ideas of Reference:None  Suicidal Thoughts: Suicidal Thoughts: Denies  Homicidal Thoughts: Denies  Sensorium  Memory:Immediate Good; Recent Good; Remote Good  Judgment:Fair  Insight:Fair   Executive Functions  Concentration:Fair  Attention Span:Fair  Recall:Good  Fund of Knowledge:Fair  Language:Good   Psychomotor Activity  Psychomotor Activity:No data  recorded   Assets  Assets:Communication Skills; Physical Health   Sleep  Sleep:No data recorded Fair   Physical Exam: Physical Exam Vitals reviewed.  Pulmonary:     Effort: Pulmonary effort is normal.  Neurological:     Mental Status: He is alert.     Motor: No weakness.  Gait: Gait normal.  Psychiatric:        Thought Content: Thought content normal.    Review of Systems  Psychiatric/Behavioral:  Negative for depression, hallucinations, substance abuse and suicidal ideas. The patient is nervous/anxious. The patient does not have insomnia.    Blood pressure 115/67, pulse 71, temperature 97.8 F (36.6 C), temperature source Oral, resp. rate 18, height 5\' 7"  (1.702 m), weight 75.3 kg, SpO2 98 %. Body mass index is 26 kg/m.   Treatment Plan Summary: Daily contact with patient to assess and evaluate symptoms and progress in treatment and Medication management   Assessment: -MDD with psychotic features (r/o schizoaffective d/o depressive type)  -Rule out OCD vs (and/or) GAD  -(per history): PTSD, polysubstance use, dissociative disorder   Plan:   Medications: -Continue Elavil 25mg  PO QHS. Patient non-compliant with cymbalta due to sexual side effects -Continue Melatonin 5mg  PO QHS -Continue trazodone 50 mg qhs prn for sleep  -Continue Seroquel XL 200 mg once daily in the morning  -Continue Seroquel XL 100 mg at 2pm - for anxiety, mood, and auditory hallucinations.  -Continue Seroquel XL 400mg  QHS   -Continue Seroquel 100 mg 3 times daily as needed as needed for anxiety and agitation -Continue Zyprexa 10 mg IM as needed for anxiety and agitation -Continue to hold buspar for now   -Previously discontinued Remeron as needed, to limit polypharmacy   Substance Abuse: brief intervention provided abstinence advised.  Personality disorder:  Firm and consistent boundaries with patient No extraneous items allowed in the room.  Avoiding controlled  substances  Medical: PRNs for pain, constipation, indigestion available.   Safety and Monitoring: In/voluntary admission to inpatient psychiatric unit for safety, stabilization and treatment Daily contact with patient to assess and evaluate symptoms and progress in treatment Patient's case to be discussed in multi-disciplinary team meeting Observation Level : 1:1 sitter Vital signs: q12 hours Precautions: suicide, withdrawal, elopement    , MD 12/10/2021, 12:03 PM  Total Time Spent in Direct Patient Care:  I personally spent 25 minutes on the unit in direct patient care. The direct patient care time included face-to-face time with the patient, reviewing the patient's chart, communicating with other professionals, and coordinating care. Greater than 50% of this time was spent in counseling or coordinating care with the patient regarding goals of hospitalization, psycho-education, and discharge planning needs.   , MD Psychiatrist

## 2021-12-10 NOTE — Group Note (Signed)
Recreation Therapy Group Note   Group Topic:Personal Development  Group Date: 12/10/2021 Start Time: 1000 End Time: 1030 Facilitators: Caroll Rancher, LRT,CTRS Location: [address] Dayroom   Goal Area(s) Addresses:  Patient will successfully define what peace means to them. Patient will successfully identify how peace can be beneficial post discharge.    Group Description: Patients were given a worksheet were they what peace means to them.  Patients would then identify positive changes they have made, positive things they look for in relationships, goals they are working towards, nonmaterial things they are grateful for and what they need to set limits on.   Affect/Mood: N/A   Participation Level: Did not attend    Clinical Observations/Individualized Feedback:     Plan: Continue to engage patient in RT group sessions 2-3x/week.   Caroll Rancher, LRT,CTRS 12/10/2021 1:29 PM
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
## 2021-12-11 NOTE — Progress Notes (Signed)
1: 1 Note: Patient maintained on constant supervision for safety.  Medication given as prescribed.  Patient in his room resting.  Routine safety checks maintained.  Patient is safe on the unit.

## 2021-12-11 NOTE — Progress Notes (Signed)
Adult Psychoeducational Group Note  Date:  12/11/2021 Time:  9:28 PM  Group Topic/Focus:  Wrap-Up Group:   The focus of this group is to help patients review their daily goal of treatment and discuss progress on daily workbooks.  Participation Level:  Active  Participation Quality:  Appropriate  Affect:  Appropriate  Cognitive:  Appropriate  Insight: Appropriate  Engagement in Group:  Developing/Improving  Modes of Intervention:  Discussion  Additional Comments:  Pt stated his goal for today was to focus on his treatment plan. Pt stated he accomplished his goal today. Pt stated he talked with his doctor and social worker about his care today. Pt rated his overall day a 7 out of 10. Pt stated he tried contact his mother several times today. Pt stated he felt better about himself today. Pt stated staff brought back all his meals today because he is on a 1:1. Pt stated he took all medications provided today. Pt stated he attend all groups held today. Pt stated his appetite was fair today. Pt rated sleep last night was pretty fair. Pt stated the goal tonight was to get some rest. Pt stated he had no physical pain tonight. Pt deny visual hallucinations and auditory issues tonight. Pt denies thoughts of harming himself or others. Pt stated he would alert staff if anything changed  Felipa Furnace 12/11/2021, 9:28 PM

## 2021-12-11 NOTE — Group Note (Addendum)
  [facility] LCSW Group Therapy Note  Date/Time:  12/11/2021 11:15AM-12:00PM  Type of Therapy and Topic:  Group Therapy:  Feelings About Hospitalization  Participation Level:  Did Not Attend   Description of Group This process group involved patients discussing their feelings related to being hospitalized, as well as the benefits they see to being in the hospital.  These feelings and benefits were itemized.  The group then brainstormed specific ways in which they could seek those same benefits when they discharge and return home.  Therapeutic Goals Patient will identify and describe positive and negative feelings related to hospitalization Patient will verbalize benefits of hospitalization to themselves personally Patients will brainstorm together ways they can obtain similar benefits in the outpatient setting, identify barriers to wellness and possible solutions  Summary of Patient Progress:  Did not attend  Therapeutic Modalities Cognitive Behavioral Therapy Motivational Interviewing    Ambrose Mantle, LCSW 12/11/2021, 1:50 PM

## 2021-12-11 NOTE — Group Note (Signed)
LCSW Group Therapy Note   Group Date: 12/11/2021 Start Time: 1115 End Time: 1200   Did not attend.  Stepen Prins J Grossman-Orr, LCSWA 12/11/2021  11:14 AM    
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
## 2021-12-11 NOTE — Progress Notes (Signed)
Did not attend group 

## 2021-12-11 NOTE — Progress Notes (Signed)
Pt remains 1:1. Pt denies ideations and hallucinations. Pt remains compliant with medications. Pt denies any pain and/or discomfort at this time.

## 2021-12-11 NOTE — [facility] Group Notes (Signed)
.  Psychoeducational Group Note  Date: 12/11/2021 Time: 0900-1000    Goal Setting   Purpose of Group: This group helps to provide patients with the steps of setting a goal that is specific, measurable, attainable, realistic and time specific. A discussion on how we keep ourselves stuck with negative self talk. Homework given for Patients to write 30 positive attributes about themselves.    Participation Level:  did not attend Jon Clayton A 

## 2021-12-11 NOTE — Progress Notes (Signed)
1:1 Note: Patient maintained on constant supervision for safety.  Medications given as prescribed.  Routine safety checks maintained.  Patient is safe on the unit with supervision.

## 2021-12-11 NOTE — Progress Notes (Signed)
Nursing 1:1 note D:Pt observed sleeping in bed with eyes closed. RR even and unlabored. No distress noted. A: 1:1 observation continues for safety  R: pt remains safe  

## 2021-12-11 NOTE — Progress Notes (Signed)
Nursing 1:1 note D:Pt observed sitting in bed with eyes open. RR even and unlabored. No distress noted. A: 1:1 observation continues for safety  R: pt remains safe  

## 2021-12-12 NOTE — Progress Notes (Signed)
[facility] MD Progress Note  12/12/2021 10:33 AM Jon Clayton  MRN:  [id number]  Subjective:   Jon Clayton is a 26 y.o., male with a past psychiatric history significant for depression, PTSD, substance use who presented to the [facility] from ER for evaluation and management of depression and PTSD initially on 11/11/2021. He was in [facility] for 7 days and discharged on 11/18/2021 to his room in a [facility]. He did not fill his medications because he had a few pills left from his last medications. The next day he went out and put in some new job applications. He got a phone call from one of his previous applications where he was notified that he did not pass his background check due to his sexual offender status and "it all kind of rushed into my head at once" and he became suicidal.   On my exam today, the patient reports that his mood is irritable.  He denies feeling down depressed or sad. Reports some anxiety due to discharge planning.  Reports sleeping okay.  Patient reports that appetite is okay and concentration is okay. He denies having any auditory hallucinations up to this point today.   He denies having any suicidal thoughts today.   Denies having any homicidal thoughts today.   He denies having any other psychotic symptoms. He denies having any adverse effects to current scheduled psychiatric medications.  Per SW:  the patient can be discharged to the shelter in [facility], as early as next Wednesday at 11 AM.  Patient is agreeable to this, and looking forward to this.  As discussed in social worker notes, this is the only option for the patient.  [place] has to [place] in [place] due to parole, and this was confirmed with the parole officer yesterday.  Due to being on the sex offender registry, and unable to go back to other places within [facility], this is his only option (outside of knowingly discharging the patient to homelessness).    Principal Problem: MDD  (major depressive disorder), recurrent, severe, with psychosis (HCC) Diagnosis: Principal Problem:   MDD (major depressive disorder), recurrent, severe, with psychosis (HCC) Active Problems:   PTSD (post-traumatic stress disorder)   Cannabis use disorder   Fibromyalgia   Personality disorder (HCC)   At high risk for self harm   Adult antisocial behavior   Suicidal behavior  Total Time spent with patient: 20 minutes  Past Psychiatric History:  Previous Psychiatric Diagnoses: MDD with psychotic features, PTSD, polysubstance use, dissociative disorder Current / Past Mental Health Providers: Denies Prior Inpatient or Outpatient Therapy: Reports multiple psychiatric hospitalization at least 7 in the past 12 months in different facilities  Most recently at [facility] 11/18/2021  Past Suicide Attempts: Reports at least 10 times attempted to harm himself last time by cutting his wrist using plastic spoon in May 2022 when he was in jail Past History of Homicidal Behaviors / Violent or Aggressive Behaviors: Denies History of Self-Mutilation: Vaguely reports self cutting but vague about details Previous Participation in PHP/IOP or Residential Programs: Denies Past History of ECT / TMS / VNS: Denies Past Psychotropic Medication Trials: Reports multiple medication treatment  Past Medical History:  Past Medical History:  Diagnosis Date   Depression    Psychosis (HCC)     Past Surgical History:  Procedure Laterality Date   NASAL SINUS SURGERY Bilateral    TONSILLECTOMY     Family History: History reviewed. No pertinent family history.  Family Psychiatric  History:  Reports maternal uncle committed suicide, reports multiple family members with bipolar disorder and schizophrenia     Social History:  Social History   Substance and Sexual Activity  Alcohol Use Not Currently   Comment: reports periodic alcohol use     Social History   Substance and Sexual Activity  Drug Use Yes   Frequency:  1.0 times per week   Types: Marijuana   Comment: 1/3 gm daily    Social History   Socioeconomic History   Marital status: Single    Spouse name: Not on file   Number of children: Not on file   Years of education: Not on file   Highest education level: 8th grade  Occupational History   Not on file  Tobacco Use   Smoking status: Every Day    Packs/day: 1.00    Years: 11.00    Total pack years: 11.00    Types: Cigarettes   Smokeless tobacco: Never  Substance and Sexual Activity   Alcohol use: Not Currently    Comment: reports periodic alcohol use   Drug use: Yes    Frequency: 1.0 times per week    Types: Marijuana    Comment: 1/3 gm daily   Sexual activity: Not Currently  Other Topics Concern   Not on file  Social History Narrative   Not on file   Social Determinants of Health   Financial Resource Strain: Not on file  Food Insecurity: Not on file  Transportation Needs: Not on file  Physical Activity: Not on file  Stress: Not on file  Social Connections: Not on file   Additional Social History:                         Sleep: Fair  Appetite:  Fair  Current Medications: Current Facility-Administered Medications  Medication Dose Route Frequency Provider Last Rate Last Admin   acetaminophen (TYLENOL) tablet 650 mg  650 mg Oral Q6H PRN White, Patrice L, NP   650 mg at 12/09/21 0837   alum & mag hydroxide-simeth (MAALOX/MYLANTA) 200-200-20 MG/5ML suspension 30 mL  30 mL Oral Q4H PRN White, Patrice L, NP       amitriptyline (ELAVIL) tablet 25 mg  25 mg Oral QHS Hill, Shelbie Hutching, MD   25 mg at 12/11/21 2036   cloNIDine (CATAPRES) tablet 0.1 mg  0.1 mg Oral Q12H Lamar Sprinkles, MD   0.1 mg at 12/12/21 0109   hydrOXYzine (ATARAX) tablet 25 mg  25 mg Oral TID PRN White, Patrice L, NP   25 mg at 12/11/21 2039   magnesium hydroxide (MILK OF MAGNESIA) suspension 30 mL  30 mL Oral Daily PRN White, Patrice L, NP       melatonin tablet 5 mg  5 mg Oral QHS Hill,  Shelbie Hutching, MD   5 mg at 12/11/21 2036   neomycin-bacitracin-polymyxin (NEOSPORIN) ointment   Topical BID Bobbitt, Shalon E, NP   Given at 12/11/21 1736   nicotine (NICODERM CQ - dosed in mg/24 hours) patch 14 mg  14 mg Transdermal Daily Hill, Shelbie Hutching, MD   14 mg at 12/12/21 0920   OLANZapine (ZYPREXA) injection 10 mg  10 mg Intramuscular Q6H PRN Roselle Locus, MD   10 mg at 12/04/21 1046   ondansetron (ZOFRAN-ODT) disintegrating tablet 4 mg  4 mg Oral Q8H PRN Roselle Locus, MD       QUEtiapine (SEROQUEL XR) 24 hr tablet 100  mg  100 mg Oral Daily Carmine Carrozza, MD   100 mg at 12/11/21 1516   QUEtiapine (SEROQUEL XR) 24 hr tablet 200 mg  200 mg Oral Daily Caryn Gienger, MD   200 mg at 12/12/21 7673   QUEtiapine (SEROQUEL XR) 24 hr tablet 400 mg  400 mg Oral QPC supper Roselle Locus, MD   400 mg at 12/11/21 1736   QUEtiapine (SEROQUEL) tablet 100 mg  100 mg Oral TID PRN Phineas Inches, MD   100 mg at 12/06/21 2055   traZODone (DESYREL) tablet 50 mg  50 mg Oral QHS PRN Phineas Inches, MD   50 mg at 12/11/21 2039    Lab Results:  No results found for this or any previous visit (from the past 48 hour(s)).   Blood Alcohol level:  Lab Results  Component Value Date   ETH <10 11/22/2021   ETH <10 11/04/2021    Metabolic Disorder Labs: Lab Results  Component Value Date   HGBA1C 4.9 11/10/2021   MPG 93.93 11/10/2021   No results found for: "PROLACTIN" Lab Results  Component Value Date   CHOL 111 11/10/2021   TRIG 98 11/10/2021   HDL 36 (L) 11/10/2021   CHOLHDL 3.1 11/10/2021   VLDL 20 11/10/2021   LDLCALC 55 11/10/2021    Physical Findings: AIMS: Facial and Oral Movements Muscles of Facial Expression: None, normal Lips and Perioral Area: None, normal Jaw: None, normal Tongue: None, normal,Extremity Movements Upper (arms, wrists, hands, fingers): None, normal Lower (legs, knees, ankles, toes): None, normal, Trunk  Movements Neck, shoulders, hips: None, normal, Overall Severity Severity of abnormal movements (highest score from questions above): None, normal Incapacitation due to abnormal movements: None, normal Patient's awareness of abnormal movements (rate only patient's report): No Awareness, Dental Status Current problems with teeth and/or dentures?: No Does patient usually wear dentures?: No  CIWA:    COWS:     Musculoskeletal: Strength & Muscle Tone: within normal limits Gait & Station: normal Patient leans: N/A  Psychiatric Specialty Exam:  Presentation  General Appearance: Appropriate for Environment; Casual  Eye Contact:Good  Speech:Normal Rate; Clear and Coherent  Speech Volume:Normal  Handedness:Right   Mood and Affect  Mood: Irritable  Affect:Appropriate Full range  Thought Process  Thought Processes:Linear  Descriptions of Associations:Intact  Orientation:Full (Time, Place and Person)  Thought Content:Logical  History of Schizophrenia/Schizoaffective disorder:No  Duration of Psychotic Symptoms:Greater than six months  Hallucinations:Hallucinations: Denies today  Ideas of Reference:None  Suicidal Thoughts: Suicidal Thoughts: Denies  Homicidal Thoughts: Denies  Sensorium  Memory:Immediate Good; Recent Good  Judgment:Fair  Insight:Fair   Executive Functions  Concentration:Good  Attention Span:Good  Recall:Good  Fund of Knowledge:Good  Language:Good   Psychomotor Activity  Psychomotor Activity:Psychomotor Activity: Normal   Assets  Assets:Communication Skills; Desire for Improvement; Physical Health; Resilience   Sleep  Sleep:Sleep: Fair Number of Hours of Sleep: 7.25 Fair   Physical Exam: Physical Exam Vitals reviewed.  Pulmonary:     Effort: Pulmonary effort is normal.  Neurological:     Mental Status: He is alert.     Motor: No weakness.     Gait: Gait normal.  Psychiatric:        Thought Content: Thought content  normal.    Review of Systems  Psychiatric/Behavioral:  Negative for depression, hallucinations, substance abuse and suicidal ideas. The patient is nervous/anxious. The patient does not have insomnia.    Blood pressure 121/72, pulse 98, temperature (!) 97.3 F (36.3 C), temperature source Oral, resp.  rate 16, height 5\' 7"  (1.702 m), weight 75.3 kg, SpO2 98 %. Body mass index is 26 kg/m.   Treatment Plan Summary: Daily contact with patient to assess and evaluate symptoms and progress in treatment and Medication management   Assessment: -MDD with psychotic features (r/o schizoaffective d/o depressive type)  -Rule out OCD vs (and/or) GAD  -(per history): PTSD, polysubstance use, dissociative disorder   Plan:   Medications: -Continue Elavil 25mg  PO QHS. Patient non-compliant with cymbalta due to sexual side effects -Continue Melatonin 5mg  PO QHS -Continue trazodone 50 mg qhs prn for sleep  -Continue Seroquel XL 200 mg once daily in the morning  -Continue Seroquel XL 100 mg at 2pm - for anxiety, mood, and auditory hallucinations.  -Continue Seroquel XL 400mg  QHS   -Continue Seroquel 100 mg 3 times daily as needed as needed for anxiety and agitation -Continue Zyprexa 10 mg IM as needed for anxiety and agitation -Continue to hold buspar for now   -Previously discontinued Remeron as needed, to limit polypharmacy   Substance Abuse: brief intervention provided abstinence advised.  Personality disorder:  Firm and consistent boundaries with patient No extraneous items allowed in the room.  Avoiding controlled substances  Medical: PRNs for pain, constipation, indigestion available.   Safety and Monitoring: In/voluntary admission to inpatient psychiatric unit for safety, stabilization and treatment Daily contact with patient to assess and evaluate symptoms and progress in treatment Patient's case to be discussed in multi-disciplinary team meeting Observation Level : 1:1 sitter Vital  signs: q12 hours Precautions: suicide, withdrawal, elopement    , MD 12/12/2021, 10:33 AM  Total Time Spent in Direct Patient Care:  I personally spent 25 minutes on the unit in direct patient care. The direct patient care time included face-to-face time with the patient, reviewing the patient's chart, communicating with other professionals, and coordinating care. Greater than 50% of this time was spent in counseling or coordinating care with the patient regarding goals of hospitalization, psycho-education, and discharge planning needs.   , MD Psychiatrist

## 2021-12-12 NOTE — Progress Notes (Signed)
Patient ID: Jon Clayton, male   DOB: 1995/09/20, 26 y.o.   MRN: [id number] 1:1 observation note: Patient observed in day room watching a movie. He understands that he must stay back from the cafeteria due to 1:1 status. Patient states that sometimes he feels like self harm; however, has not felt that way today. Encouraged to come to nursing staff with any concerns.   Continue 1:1 observation for safety.

## 2021-12-12 NOTE — [facility] Group Notes (Signed)
Adult Psychoeducational Group Note Date:  12/12/2021 Time:  0900-1045 Group Topic/Focus: PROGRESSIVE RELAXATION. A group where deep breathing is taught and tensing and relaxation muscle groups is used. Imagery is used as well.  Pts are asked to imagine 3 pillars that hold them up when they are not able to hold themselves up and to share that with the group.  Participation Level:  Active  Participation Quality:  Appropriate  Affect:  Appropriate  Cognitive:  Oriented  Insight: Improving  Engagement in Group:  Engaged  Modes of Intervention:  Activity, Discussion, Education, and Support  Additional Comments:  Rates energy at a 7/10. States this hospital holds him up as well as mom.  Dione Housekeeper

## 2021-12-12 NOTE — Progress Notes (Signed)
Patient ID: Jon Clayton, male   DOB: [birth year], 26 y.o.   MRN: [id number] 1:1 observation note: Patient observed lying in bed with blanket over his head. Patient remains under 1:1 observation due self-harm behaviors. Nursing care note indicates patient should only have mattress, pillow, towel, washcloth and 1 cup in his room only.

## 2021-12-12 NOTE — Progress Notes (Signed)
Patient ID: Jon Clayton, male   DOB: [birth year], 26 y.o.   MRN: [id number] D: Patient observed up this morning at medication window. Patient was sleeping the majority of early morning hours. Patient indicates that he remains with "some thoughts of self harm." He showed this nurse a place on his arm that he "scratched myself with my fingernails." Patient fingernails are long where patient has not cut them. He remains on a 1:1 observation for self-harm behaviors. Patient's affect remains flat; his mood depressed.   A: Continue to monitor medication management and MD orders.  Safety checks completed every 15 minutes per protocol.  Offer support and encouragement as needed.  R: Patient is receptive to staff; his behavior is appropriate.

## 2021-12-12 NOTE — Progress Notes (Signed)
Adult Psychoeducational Group Note  Date:  12/12/2021 Time:  9:34 PM  Group Topic/Focus:  Wrap-Up Group:   The focus of this group is to help patients review their daily goal of treatment and discuss progress on daily workbooks.  Participation Level:  Did Not Attend  Participation Quality:   Did Not Attend  Affect:   Did Not Attend  Cognitive:   Did Not Attend  Insight: None  Engagement in Group:   Did Not Attend  Modes of Intervention:   Did Not Attend  Additional Comments:  Pt was encouraged to attend wrap up group but did not attend.  Felipa Furnace 12/12/2021, 9:34 PM

## 2021-12-12 NOTE — Progress Notes (Signed)

## 2021-12-12 NOTE — Progress Notes (Signed)
   12/12/21 0900  Psych Admission Type (Psych Patients Only)  Admission Status Involuntary  Psychosocial Assessment  Patient Complaints Depression  Eye Contact Fair  Facial Expression Flat  Affect Depressed  Speech Logical/coherent  Interaction Cautious  Motor Activity Slow  Appearance/Hygiene Unremarkable  Behavior Characteristics Cooperative  Mood Depressed  Thought Process  Coherency Circumstantial  Content Preoccupation  Delusions Paranoid  Perception Hallucinations  Hallucination Auditory  Judgment Poor  Confusion None  Danger to Self  Current suicidal ideation? Denies  Danger to Others  Danger to Others None reported or observed

## 2021-12-12 NOTE — Progress Notes (Signed)
Patient ID: Jon Clayton, male   DOB: 11-19-1995, 26 y.o.   MRN: [id number] 1:1 observation note: Patient observed in the day room watching a movie. He has been attending group activities in the milieu today. Patient denies any thoughts of self-harm behaviors today. Patient is compliant with his medications.

## 2021-12-12 NOTE — Group Note (Signed)
[facility] LCSW Group Therapy Note  Date/Time:  12/12/2021  11:00AM-12:00PM  Type of Therapy and Topic:  Group Therapy:  Music and Mood  Participation Level:  Active   Description of Group: In this process group, members listened to a variety of genres of music and identified that different types of music evoke different responses.  Patients were encouraged to identify music that was soothing for them and music that was energizing for them.  Patients discussed how this knowledge can help with wellness and recovery in various ways including managing depression and anxiety as well as encouraging healthy sleep habits.    Therapeutic Goals: Patients will explore the impact of different varieties of music on mood Patients will verbalize the thoughts they have when listening to different types of music Patients will identify music that is soothing to them as well as music that is energizing to them Patients will discuss how to use this knowledge to assist in maintaining wellness and recovery Patients will explore the use of music as a coping skill  Summary of Patient Progress:  At the beginning of group, patient expressed his mood was "okay."  He then participated fully throughout the group with many apropos comments, showed insight.  At the end of group, patient expressed his mood was improved.    Therapeutic Modalities: Solution Focused Brief Therapy Activity   Ambrose Mantle, LCSW

## 2021-12-12 NOTE — Progress Notes (Addendum)
1:1 Nursing Note Patient has been in his room talking with sitter. Writer spoke with him 1:1 and he reported that he had a good day but did not wake up in a good mood. He reports attending group earlier today but did not want to go group tonight. He was compliant with his medications. 1:1 continues and patient is safe.

## 2021-12-12 NOTE — Progress Notes (Signed)
Writer asked staff 3 different times to come to dayroom to have vitals. Jon Clayton rolled over 3 different times, put the covers over his face, Finally Jon Clayton said,"they need to come to his room to get vitals." He is not going to damn dayroom for vitals. They came to him the last two weeks for vitals. Jon Clayton said, "he communicate usage profanity. Writer was able to take Jon Clayton's vitals sitting and standing.

## 2021-12-13 NOTE — Progress Notes (Addendum)
[facility] MD Progress Note  12/13/2021 3:58 PM Jon Clayton  MRN:  [id number]  Subjective:   Jon Clayton is a 26 y.o., male with a past psychiatric history significant for depression, PTSD, substance use who presented to the [facility] from ER for evaluation and management of depression and PTSD initially on 11/11/2021. He was in [facility] for 7 days and discharged on 11/18/2021 to his room in a [facility]. He did not fill his medications because he had a few pills left from his last medications. The next day he went out and put in some new job applications. He got a phone call from one of his previous applications where he was notified that he did not pass his background check due to his sexual offender status and "it all kind of rushed into my head at once" and he became suicidal.   On my exam today, the patient reports that his mood is better and less irritable than yesterday.  He denies feeling down depressed or sad. Reports some anxiety due to discharge planning.  Reports sleeping okay.  Patient reports that appetite is okay and concentration is okay. He denies having any auditory hallucinations up to this point today.   He denies having any suicidal thoughts today.   Denies having any homicidal thoughts today.   He denies having any other psychotic symptoms. He denies having any adverse effects to current scheduled psychiatric medications.  Per SW:  the patient can be discharged to the shelter in [facility], as early as next Wednesday at 11 AM.  Patient is agreeable to this, and looking forward to this.  As discussed in social worker notes, this is the only option for the patient.  He has to remain in [place] due to parole, and this was confirmed with the parole officer yesterday.  Due to being on the sex offender registry, and unable to go back to other places within [facility], this is his only option (outside of knowingly discharging the patient to  homelessness).    Principal Problem: MDD (major depressive disorder), recurrent, severe, with psychosis (HCC) Diagnosis: Principal Problem:   MDD (major depressive disorder), recurrent, severe, with psychosis (HCC) Active Problems:   PTSD (post-traumatic stress disorder)   Cannabis use disorder   Fibromyalgia   Personality disorder (HCC)   At high risk for self harm   Adult antisocial behavior   Suicidal behavior  Total Time spent with patient: 20 minutes  Past Psychiatric History:  Previous Psychiatric Diagnoses: MDD with psychotic features, PTSD, polysubstance use, dissociative disorder Current / Past Mental Health Providers: Denies Prior Inpatient or Outpatient Therapy: Reports multiple psychiatric hospitalization at least 7 in the past 12 months in different facilities  Most recently at [facility] 11/18/2021  Past Suicide Attempts: Reports at least 10 times attempted to harm himself last time by cutting his wrist using plastic spoon in May 2022 when he was in jail Past History of Homicidal Behaviors / Violent or Aggressive Behaviors: Denies History of Self-Mutilation: Vaguely reports self cutting but vague about details Previous Participation in PHP/IOP or Residential Programs: Denies Past History of ECT / TMS / VNS: Denies Past Psychotropic Medication Trials: Reports multiple medication treatment  Past Medical History:  Past Medical History:  Diagnosis Date   Depression    Psychosis (HCC)     Past Surgical History:  Procedure Laterality Date   NASAL SINUS SURGERY Bilateral    TONSILLECTOMY     Family History: History reviewed.  No pertinent family history.  Family Psychiatric  History:  Reports maternal uncle committed suicide, reports multiple family members with bipolar disorder and schizophrenia     Social History:  Social History   Substance and Sexual Activity  Alcohol Use Not Currently   Comment: reports periodic alcohol use     Social History   Substance and  Sexual Activity  Drug Use Yes   Frequency: 1.0 times per week   Types: Marijuana   Comment: 1/3 gm daily    Social History   Socioeconomic History   Marital status: Single    Spouse name: Not on file   Number of children: Not on file   Years of education: Not on file   Highest education level: 8th grade  Occupational History   Not on file  Tobacco Use   Smoking status: Every Day    Packs/day: 1.00    Years: 11.00    Total pack years: 11.00    Types: Cigarettes   Smokeless tobacco: Never  Substance and Sexual Activity   Alcohol use: Not Currently    Comment: reports periodic alcohol use   Drug use: Yes    Frequency: 1.0 times per week    Types: Marijuana    Comment: 1/3 gm daily   Sexual activity: Not Currently  Other Topics Concern   Not on file  Social History Narrative   Not on file   Social Determinants of Health   Financial Resource Strain: Not on file  Food Insecurity: Not on file  Transportation Needs: Not on file  Physical Activity: Not on file  Stress: Not on file  Social Connections: Not on file   Additional Social History:                         Sleep: Fair  Appetite:  Fair  Current Medications: Current Facility-Administered Medications  Medication Dose Route Frequency Provider Last Rate Last Admin   acetaminophen (TYLENOL) tablet 650 mg  650 mg Oral Q6H PRN White, Patrice L, NP   650 mg at 12/09/21 0837   alum & mag hydroxide-simeth (MAALOX/MYLANTA) 200-200-20 MG/5ML suspension 30 mL  30 mL Oral Q4H PRN White, Patrice L, NP       amitriptyline (ELAVIL) tablet 25 mg  25 mg Oral QHS Hill, Shelbie Hutching, MD   25 mg at 12/12/21 2045   cloNIDine (CATAPRES) tablet 0.1 mg  0.1 mg Oral Q12H Lamar Sprinkles, MD   0.1 mg at 12/13/21 7124   hydrOXYzine (ATARAX) tablet 25 mg  25 mg Oral TID PRN White, Patrice L, NP   25 mg at 12/12/21 2046   magnesium hydroxide (MILK OF MAGNESIA) suspension 30 mL  30 mL Oral Daily PRN White, Patrice L, NP        melatonin tablet 5 mg  5 mg Oral QHS Hill, Shelbie Hutching, MD   5 mg at 12/12/21 2045   neomycin-bacitracin-polymyxin (NEOSPORIN) ointment   Topical BID Bobbitt, Shalon E, NP   Given at 12/11/21 1736   nicotine (NICODERM CQ - dosed in mg/24 hours) patch 14 mg  14 mg Transdermal Daily Hill, Shelbie Hutching, MD   14 mg at 12/13/21 0828   OLANZapine (ZYPREXA) injection 10 mg  10 mg Intramuscular Q6H PRN Roselle Locus, MD   10 mg at 12/04/21 1046   ondansetron (ZOFRAN-ODT) disintegrating tablet 4 mg  4 mg Oral Q8H PRN Roselle Locus, MD       QUEtiapine (SEROQUEL  XR) 24 hr tablet 100 mg  100 mg Oral Daily Maxamilian Amadon, MD   100 mg at 12/13/21 1458   QUEtiapine (SEROQUEL XR) 24 hr tablet 200 mg  200 mg Oral Daily Kaelene Elliston, MD   200 mg at 12/13/21 0829   QUEtiapine (SEROQUEL XR) 24 hr tablet 400 mg  400 mg Oral QPC supper Roselle Locus, MD   400 mg at 12/12/21 1756   QUEtiapine (SEROQUEL) tablet 100 mg  100 mg Oral TID PRN Phineas Inches, MD   100 mg at 12/06/21 2055   traZODone (DESYREL) tablet 50 mg  50 mg Oral QHS PRN Phineas Inches, MD   50 mg at 12/12/21 2047    Lab Results:  No results found for this or any previous visit (from the past 48 hour(s)).   Blood Alcohol level:  Lab Results  Component Value Date   ETH <10 11/22/2021   ETH <10 11/04/2021    Metabolic Disorder Labs: Lab Results  Component Value Date   HGBA1C 4.9 11/10/2021   MPG 93.93 11/10/2021   No results found for: "PROLACTIN" Lab Results  Component Value Date   CHOL 111 11/10/2021   TRIG 98 11/10/2021   HDL 36 (L) 11/10/2021   CHOLHDL 3.1 11/10/2021   VLDL 20 11/10/2021   LDLCALC 55 11/10/2021    Physical Findings: AIMS: Facial and Oral Movements Muscles of Facial Expression: None, normal Lips and Perioral Area: None, normal Jaw: None, normal Tongue: None, normal,Extremity Movements Upper (arms, wrists, hands, fingers): None, normal Lower (legs, knees,  ankles, toes): None, normal, Trunk Movements Neck, shoulders, hips: None, normal, Overall Severity Severity of abnormal movements (highest score from questions above): None, normal Incapacitation due to abnormal movements: None, normal Patient's awareness of abnormal movements (rate only patient's report): No Awareness, Dental Status Current problems with teeth and/or dentures?: No Does patient usually wear dentures?: No  CIWA:    COWS:     Musculoskeletal: Strength & Muscle Tone: within normal limits Gait & Station: normal Patient leans: N/A  Psychiatric Specialty Exam:  Presentation  General Appearance: Appropriate for Environment; Casual  Eye Contact:Good  Speech:Normal Rate; Clear and Coherent  Speech Volume:Normal  Handedness:Right   Mood and Affect  Mood: euthymic, anxious  Affect:Appropriate Full range  Thought Process  Thought Processes:Linear  Descriptions of Associations:Intact  Orientation:Full (Time, Place and Person)  Thought Content:Logical  History of Schizophrenia/Schizoaffective disorder:No  Duration of Psychotic Symptoms:Greater than six months  Hallucinations:Hallucinations: Denies today  Ideas of Reference:None  Suicidal Thoughts: Suicidal Thoughts: Denies  Homicidal Thoughts: Denies  Sensorium  Memory:Immediate Good; Recent Good  Judgment:Fair  Insight:Fair   Executive Functions  Concentration:Good  Attention Span:Good  Recall:Good  Fund of Knowledge:Good  Language:Good   Psychomotor Activity  Psychomotor Activity:No data recorded   Assets  Assets:Communication Skills; Desire for Improvement; Physical Health; Resilience   Sleep  Sleep:No data recorded Fair   Physical Exam: Physical Exam Vitals reviewed.  Pulmonary:     Effort: Pulmonary effort is normal.  Neurological:     Mental Status: He is alert.     Motor: No weakness.     Gait: Gait normal.  Psychiatric:        Thought Content: Thought  content normal.    Review of Systems  Psychiatric/Behavioral:  Negative for depression, hallucinations, substance abuse and suicidal ideas. The patient is nervous/anxious. The patient does not have insomnia.    Blood pressure 105/60, pulse 83, temperature (!) 97.4 F (36.3 C), temperature source Oral,  resp. rate 16, height 5\' 7"  (1.702 m), weight 75.3 kg, SpO2 99 %. Body mass index is 26 kg/m.   Treatment Plan Summary: Daily contact with patient to assess and evaluate symptoms and progress in treatment and Medication management   Assessment: -MDD with psychotic features (r/o schizoaffective d/o depressive type)  -Rule out OCD vs (and/or) GAD  -(per history): PTSD, polysubstance use, dissociative disorder   Plan:   Medications: -Continue Elavil 25mg  PO QHS. Patient non-compliant with cymbalta due to sexual side effects -Continue Melatonin 5mg  PO QHS -Continue trazodone 50 mg qhs prn for sleep  -Continue Seroquel XL 200 mg once daily in the morning  -Continue Seroquel XL 100 mg at 2pm - for anxiety, mood, and auditory hallucinations.  -Continue Seroquel XL 400mg  QHS   -ordered EKG for cardiac monitoring on antipsychotic and TCA. NSR. Qtc okay.   -Continue Seroquel 100 mg 3 times daily as needed as needed for anxiety and agitation -Continue Zyprexa 10 mg IM as needed for anxiety and agitation -Continue to hold buspar for now   -Previously discontinued Remeron as needed, to limit polypharmacy   Substance Abuse: brief intervention provided abstinence advised.  Personality disorder:  Firm and consistent boundaries with patient No extraneous items allowed in the room.  Avoiding controlled substances  Medical: PRNs for pain, constipation, indigestion available.   Safety and Monitoring: In/voluntary admission to inpatient psychiatric unit for safety, stabilization and treatment Daily contact with patient to assess and evaluate symptoms and progress in treatment Patient's case  to be discussed in multi-disciplinary team meeting Observation Level : 1:1 sitter Vital signs: q12 hours Precautions: suicide, withdrawal, elopement    , MD 12/13/2021, 3:58 PM  Total Time Spent in Direct Patient Care:  I personally spent 25 minutes on the unit in direct patient care. The direct patient care time included face-to-face time with the patient, reviewing the patient's chart, communicating with other professionals, and coordinating care. Greater than 50% of this time was spent in counseling or coordinating care with the patient regarding goals of hospitalization, psycho-education, and discharge planning needs.   , MD Psychiatrist

## 2021-12-13 NOTE — Progress Notes (Signed)
1:1 Nursing Note - Patient lying in bed asleep no distress note. !:1 continues and patient is safe with sitter at bedside.

## 2021-12-13 NOTE — Progress Notes (Signed)
Adult Psychoeducational Group Note  Date:  12/13/2021 Time:  10:21 PM  Group Topic/Focus:  Wrap-Up Group:   The focus of this group is to help patients review their daily goal of treatment and discuss progress on daily workbooks.  Participation Level:  Did Not Attend  Participation Quality:   Did Not Attend  Affect:   Did Not Attend  Cognitive:   Did Not Attend  Insight: None  Engagement in Group:   Did Not Attend  Modes of Intervention:   Did Not Attend  Additional Comments:   Pt was encouraged to attend wrap up group but did not attend.  Felipa Furnace 12/13/2021, 10:21 PM

## 2021-12-13 NOTE — Progress Notes (Signed)
1:1 Note: Patient visible in milieu watching TV and interacting with peers.  Medication given as prescribed.  Routine safety checks maintained.  Patient is safe on the unit.

## 2021-12-13 NOTE — Progress Notes (Signed)
1:1 Note: Patient maintained on constant supervision for safety.  Patient in the dayroom watching TV and interacting with staff and peers.  Routine safety checks maintained.  Patient is safe on the unit with supervision.

## 2021-12-13 NOTE — Progress Notes (Signed)
   12/13/21 2200  Psych Admission Type (Psych Patients Only)  Admission Status Involuntary  Psychosocial Assessment  Patient Complaints Depression  Eye Contact Brief  Facial Expression Flat  Affect Appropriate to circumstance  Speech Logical/coherent  Interaction Assertive  Motor Activity Slow  Appearance/Hygiene Unremarkable  Behavior Characteristics Cooperative  Mood Depressed  Aggressive Behavior  Effect No apparent injury  Thought Process  Coherency Circumstantial  Content Preoccupation  Delusions Paranoid  Perception Hallucinations  Hallucination Auditory  Judgment Poor  Confusion None  Danger to Self  Current suicidal ideation? Denies  Danger to Others  Danger to Others None reported or observed  Danger to Others Abnormal  Harmful Behavior to others No threats or harm toward other people

## 2021-12-13 NOTE — Group Note (Signed)
LCSW Group Therapy Note  Group Date: 12/13/2021 Start Time: 1300 End Time: 1400   Type of Therapy and Topic:  Group Therapy: Anger Cues and Responses  Participation Level:  Did Not Attend   Description of Group:   In this group, patients learned how to recognize the physical, cognitive, emotional, and behavioral responses they have to anger-provoking situations.  They identified a recent time they became angry and how they reacted.  They analyzed how their reaction was possibly beneficial and how it was possibly unhelpful.  The group discussed a variety of healthier coping skills that could help with such a situation in the future.  Focus was placed on how helpful it is to recognize the underlying emotions to our anger, because working on those can lead to a more permanent solution as well as our ability to focus on the important rather than the urgent.  Therapeutic Goals: Patients will remember their last incident of anger and how they felt emotionally and physically, what their thoughts were at the time, and how they behaved. Patients will identify how their behavior at that time worked for them, as well as how it worked against them. Patients will explore possible new behaviors to use in future anger situations. Patients will learn that anger itself is normal and cannot be eliminated, and that healthier reactions can assist with resolving conflict rather than worsening situations.  Summary of Patient Progress:  Did not attend  Therapeutic Modalities:   Cognitive Behavioral Therapy    Laterica Matarazzo E Bracy Pepper, LCSW 12/13/2021  2:07 PM    

## 2021-12-13 NOTE — Progress Notes (Signed)
1:1 Nursing note - Patient lying in bed asleep no distress noted. Respirations even and unlabored. 1:1 continues for patient's safety.

## 2021-12-13 NOTE — Progress Notes (Signed)
Patient did not attend group.

## 2021-12-13 NOTE — Progress Notes (Signed)
1:1 Note: Patient maintained on constant supervision for safety.  Medication given as prescribed.  Patient visible in milieu for therapy and activities.  Patient is safe on the unit.  Denies suicidal thoughts, auditory and visual hallucinations.

## 2021-12-14 NOTE — Progress Notes (Signed)
Pt stated he was anxious and excited about his D/C tomorrow about what is waiting for him in the near future    12/14/21 2115  Psych Admission Type (Psych Patients Only)  Admission Status Involuntary  Psychosocial Assessment  Patient Complaints Anxiety;Worrying;Nervousness  Eye Contact Brief  Facial Expression Flat  Affect Appropriate to circumstance  Speech Logical/coherent  Interaction Assertive  Motor Activity Slow  Appearance/Hygiene Unremarkable  Behavior Characteristics Cooperative  Mood Depressed  Aggressive Behavior  Effect No apparent injury  Thought Process  Coherency Circumstantial  Content Preoccupation  Delusions Paranoid  Perception Hallucinations  Hallucination Auditory  Judgment Poor  Confusion None  Danger to Self  Current suicidal ideation? Denies  Danger to Others  Danger to Others None reported or observed  Danger to Others Abnormal  Harmful Behavior to others No threats or harm toward other people

## 2021-12-14 NOTE — Progress Notes (Signed)
   12/14/21 1000  Psych Admission Type (Psych Patients Only)  Admission Status Involuntary  Psychosocial Assessment  Patient Complaints None  Eye Contact Brief  Facial Expression Flat  Affect Appropriate to circumstance  Speech Logical/coherent  Interaction Assertive  Motor Activity Slow  Appearance/Hygiene Unremarkable  Behavior Characteristics Cooperative  Mood Depressed  Aggressive Behavior  Effect No apparent injury  Thought Process  Coherency Circumstantial  Content Preoccupation  Delusions None reported or observed  Perception WDL  Hallucination None reported or observed  Judgment Poor  Confusion None  Danger to Self  Current suicidal ideation? Denies  Danger to Others  Danger to Others None reported or observed  Danger to Others Abnormal  Harmful Behavior to others No threats or harm toward other people

## 2021-12-14 NOTE — Progress Notes (Signed)
[facility] MD Progress Note  12/14/2021 3:20 PM Jon Clayton  MRN:  [id number]  Subjective:   Jon Clayton is a 26 y.o., male with a past psychiatric history significant for depression, PTSD, substance use who presented to the [facility] from ER for evaluation and management of depression and PTSD initially on 11/11/2021. He was in [facility] for 7 days and discharged on 11/18/2021 to his room in a [facility]. He did not fill his medications because he had a few pills left from his last medications. The next day he went out and put in some new job applications. He got a phone call from one of his previous applications where he was notified that he did not pass his background check due to his sexual offender status and "it all kind of rushed into my head at once" and he became suicidal.   On my exam today, the patient reports that his mood is better and euthymic. He denies feeling down depressed or sad. Reports some anxiety due to discharge planning.  Reports sleeping okay.  Patient reports that appetite is okay and concentration is okay. He denies having any auditory hallucinations up to this point today.   He denies having any suicidal thoughts today.   Denies having any homicidal thoughts today.   He denies having any other psychotic symptoms. He denies having any adverse effects to current scheduled psychiatric medications.  Per SW:  The patient can be discharged to the shelter in [facility], as early as next Wednesday at 11 AM.  Patient is agreeable to this, and looking forward to this.  As discussed in social worker notes, this is the only option for the patient.  He has to remain in [place] due to parole, and this was confirmed with the parole officer yesterday.  Due to being on the sex offender registry, and unable to go back to other places within [facility], this is his only option (outside of knowingly discharging the patient to homelessness).    Principal  Problem: MDD (major depressive disorder), recurrent, severe, with psychosis (HCC) Diagnosis: Principal Problem:   MDD (major depressive disorder), recurrent, severe, with psychosis (HCC) Active Problems:   PTSD (post-traumatic stress disorder)   Cannabis use disorder   Fibromyalgia   Personality disorder (HCC)   At high risk for self harm   Adult antisocial behavior   Suicidal behavior  Total Time spent with patient: 20 minutes  Past Psychiatric History:  Previous Psychiatric Diagnoses: MDD with psychotic features, PTSD, polysubstance use, dissociative disorder Current / Past Mental Health Providers: Denies Prior Inpatient or Outpatient Therapy: Reports multiple psychiatric hospitalization at least 7 in the past 12 months in different facilities  Most recently at [facility] 11/18/2021  Past Suicide Attempts: Reports at least 10 times attempted to harm himself last time by cutting his wrist using plastic spoon in May 2022 when he was in jail Past History of Homicidal Behaviors / Violent or Aggressive Behaviors: Denies History of Self-Mutilation: Vaguely reports self cutting but vague about details Previous Participation in PHP/IOP or Residential Programs: Denies Past History of ECT / TMS / VNS: Denies Past Psychotropic Medication Trials: Reports multiple medication treatment  Past Medical History:  Past Medical History:  Diagnosis Date   Depression    Psychosis (HCC)     Past Surgical History:  Procedure Laterality Date   NASAL SINUS SURGERY Bilateral    TONSILLECTOMY     Family History: History reviewed. No pertinent family history.  Family Psychiatric  History:  Reports maternal uncle committed suicide, reports multiple family members with bipolar disorder and schizophrenia     Social History:  Social History   Substance and Sexual Activity  Alcohol Use Not Currently   Comment: reports periodic alcohol use     Social History   Substance and Sexual Activity  Drug Use Yes    Frequency: 1.0 times per week   Types: Marijuana   Comment: 1/3 gm daily    Social History   Socioeconomic History   Marital status: Single    Spouse name: Not on file   Number of children: Not on file   Years of education: Not on file   Highest education level: 8th grade  Occupational History   Not on file  Tobacco Use   Smoking status: Every Day    Packs/day: 1.00    Years: 11.00    Total pack years: 11.00    Types: Cigarettes   Smokeless tobacco: Never  Substance and Sexual Activity   Alcohol use: Not Currently    Comment: reports periodic alcohol use   Drug use: Yes    Frequency: 1.0 times per week    Types: Marijuana    Comment: 1/3 gm daily   Sexual activity: Not Currently  Other Topics Concern   Not on file  Social History Narrative   Not on file   Social Determinants of Health   Financial Resource Strain: Not on file  Food Insecurity: Not on file  Transportation Needs: Not on file  Physical Activity: Not on file  Stress: Not on file  Social Connections: Not on file   Additional Social History:                         Sleep: Fair  Appetite:  Fair  Current Medications: Current Facility-Administered Medications  Medication Dose Route Frequency Provider Last Rate Last Admin   acetaminophen (TYLENOL) tablet 650 mg  650 mg Oral Q6H PRN White, Patrice L, NP   650 mg at 12/09/21 0837   alum & mag hydroxide-simeth (MAALOX/MYLANTA) 200-200-20 MG/5ML suspension 30 mL  30 mL Oral Q4H PRN White, Patrice L, NP       amitriptyline (ELAVIL) tablet 25 mg  25 mg Oral QHS Hill, Shelbie Hutching, MD   25 mg at 12/13/21 2051   cloNIDine (CATAPRES) tablet 0.1 mg  0.1 mg Oral Q12H Lamar Sprinkles, MD   0.1 mg at 12/14/21 0816   hydrOXYzine (ATARAX) tablet 25 mg  25 mg Oral TID PRN White, Patrice L, NP   25 mg at 12/13/21 2051   magnesium hydroxide (MILK OF MAGNESIA) suspension 30 mL  30 mL Oral Daily PRN White, Patrice L, NP       melatonin tablet 5 mg  5 mg  Oral QHS Hill, Shelbie Hutching, MD   5 mg at 12/13/21 2051   neomycin-bacitracin-polymyxin (NEOSPORIN) ointment   Topical BID Bobbitt, Shalon E, NP   Given at 12/11/21 1736   nicotine (NICODERM CQ - dosed in mg/24 hours) patch 14 mg  14 mg Transdermal Daily Hill, Shelbie Hutching, MD   14 mg at 12/14/21 0817   OLANZapine (ZYPREXA) injection 10 mg  10 mg Intramuscular Q6H PRN Roselle Locus, MD   10 mg at 12/04/21 1046   ondansetron (ZOFRAN-ODT) disintegrating tablet 4 mg  4 mg Oral Q8H PRN Roselle Locus, MD       QUEtiapine (SEROQUEL XR) 24 hr tablet 100  mg  100 mg Oral Daily Aubrei Bouchie, MD   100 mg at 12/14/21 1426   QUEtiapine (SEROQUEL XR) 24 hr tablet 200 mg  200 mg Oral Daily Maniah Nading, MD   200 mg at 12/14/21 0816   QUEtiapine (SEROQUEL XR) 24 hr tablet 400 mg  400 mg Oral QPC supper Roselle Locus, MD   400 mg at 12/13/21 1803   QUEtiapine (SEROQUEL) tablet 100 mg  100 mg Oral TID PRN Phineas Inches, MD   100 mg at 12/06/21 2055   traZODone (DESYREL) tablet 50 mg  50 mg Oral QHS PRN Phineas Inches, MD   50 mg at 12/13/21 2051    Lab Results:  No results found for this or any previous visit (from the past 48 hour(s)).   Blood Alcohol level:  Lab Results  Component Value Date   ETH <10 11/22/2021   ETH <10 11/04/2021    Metabolic Disorder Labs: Lab Results  Component Value Date   HGBA1C 4.9 11/10/2021   MPG 93.93 11/10/2021   No results found for: "PROLACTIN" Lab Results  Component Value Date   CHOL 111 11/10/2021   TRIG 98 11/10/2021   HDL 36 (L) 11/10/2021   CHOLHDL 3.1 11/10/2021   VLDL 20 11/10/2021   LDLCALC 55 11/10/2021    Physical Findings: AIMS: Facial and Oral Movements Muscles of Facial Expression: None, normal Lips and Perioral Area: None, normal Jaw: None, normal Tongue: None, normal,Extremity Movements Upper (arms, wrists, hands, fingers): None, normal Lower (legs, knees, ankles, toes): None, normal, Trunk  Movements Neck, shoulders, hips: None, normal, Overall Severity Severity of abnormal movements (highest score from questions above): None, normal Incapacitation due to abnormal movements: None, normal Patient's awareness of abnormal movements (rate only patient's report): No Awareness, Dental Status Current problems with teeth and/or dentures?: No Does patient usually wear dentures?: No  CIWA:    COWS:     Musculoskeletal: Strength & Muscle Tone: within normal limits Gait & Station: normal Patient leans: N/A  Psychiatric Specialty Exam:  Presentation  General Appearance: Appropriate for Environment; Casual  Eye Contact:Good  Speech:Normal Rate; Clear and Coherent  Speech Volume:Normal  Handedness:Right   Mood and Affect  Mood: euthymic, anxious  Affect:Appropriate Full range  Thought Process  Thought Processes:Linear  Descriptions of Associations:Intact  Orientation:Full (Time, Place and Person)  Thought Content:Logical  History of Schizophrenia/Schizoaffective disorder:No  Duration of Psychotic Symptoms:Greater than six months  Hallucinations:Hallucinations: Denies   Ideas of Reference:None  Suicidal Thoughts: Suicidal Thoughts: Denies  Homicidal Thoughts: Denies  Sensorium  Memory:Immediate Good; Recent Good  Judgment:Fair  Insight:Fair   Executive Functions  Concentration:Good  Attention Span:Good  Recall:Good  Fund of Knowledge:Good  Language:Good   Psychomotor Activity  Psychomotor Activity:No data recorded Normal  Assets  Assets:Communication Skills; Desire for Improvement; Physical Health; Resilience   Sleep  Sleep:No data recorded Fair   Physical Exam: Physical Exam Vitals reviewed.  Pulmonary:     Effort: Pulmonary effort is normal.  Neurological:     Mental Status: He is alert.     Motor: No weakness.     Gait: Gait normal.  Psychiatric:        Thought Content: Thought content normal.    Review of Systems   Psychiatric/Behavioral:  Negative for depression, hallucinations, substance abuse and suicidal ideas. The patient is nervous/anxious. The patient does not have insomnia.    Blood pressure 115/67, pulse 86, temperature 98.1 F (36.7 C), temperature source Oral, resp. rate 16, height 5\' 7"  (  1.702 m), weight 75.3 kg, SpO2 99 %. Body mass index is 26 kg/m.   Treatment Plan Summary: Daily contact with patient to assess and evaluate symptoms and progress in treatment and Medication management   Assessment: -MDD with psychotic features (r/o schizoaffective d/o depressive type)  -Rule out OCD vs (and/or) GAD  -(per history): PTSD, polysubstance use, dissociative disorder   Plan:   Medications: -Continue Elavil 25mg  QHS. Patient non-compliant with cymbalta due to sexual side effects -Continue Melatonin 5mg  QHS -Continue trazodone 50 mg qhs prn for sleep  -Continue Seroquel XL 200 mg once daily in the morning  -Continue Seroquel XL 100 mg at 2pm - for anxiety, mood, and auditory hallucinations.  -Continue Seroquel XL 400mg  QHS   -(completed) Ordered EKG for cardiac monitoring on antipsychotic and TCA. NSR. Qtc okay.   -Continue Seroquel 100 mg 3 times daily as needed as needed for anxiety and agitation -Continue Zyprexa 10 mg IM as needed for anxiety and agitation -Continue to hold buspar for now   -Previously discontinued Remeron as needed, to limit polypharmacy   Substance Abuse: brief intervention provided abstinence advised.  Personality disorder:  Firm and consistent boundaries with patient No extraneous items allowed in the room.  Avoiding controlled substances  Medical: PRNs for pain, constipation, indigestion available.   Safety and Monitoring: In/voluntary admission to inpatient psychiatric unit for safety, stabilization and treatment Daily contact with patient to assess and evaluate symptoms and progress in treatment Patient's case to be discussed in multi-disciplinary  team meeting Observation Level : 1:1 sitter Vital signs: q12 hours Precautions: suicide, withdrawal, elopement    , MD 12/14/2021, 3:20 PM  Total Time Spent in Direct Patient Care:  I personally spent 25 minutes on the unit in direct patient care. The direct patient care time included face-to-face time with the patient, reviewing the patient's chart, communicating with other professionals, and coordinating care. Greater than 50% of this time was spent in counseling or coordinating care with the patient regarding goals of hospitalization, psycho-education, and discharge planning needs.   , MD Psychiatrist

## 2021-12-14 NOTE — [facility] Group Notes (Signed)
Patient attended orientation group and completed self inventory sheet.  

## 2021-12-14 NOTE — Progress Notes (Signed)
Nursing 1:1 note D:Pt observed sitting in bed with eyes open. RR even and unlabored. No distress noted. A: 1:1 observation continues for safety  R: pt remains safe  

## 2021-12-14 NOTE — Progress Notes (Signed)
Nursing 1:1 note D:Pt observed sleeping in bed with eyes closed. RR even and unlabored. No distress noted. A: 1:1 observation continues for safety  R: pt remains safe  

## 2021-12-14 NOTE — Progress Notes (Signed)
   12/14/21 0545  Sleep  Number of Hours 6

## 2021-12-14 NOTE — Progress Notes (Signed)
The focus of this group is to help patients review their daily goal of treatment and discuss progress on daily workbooks.  Pt attended the evening group and responded to all discussion prompts from the Writer. Pt shared that today was a good day on the unit, the highlight of which was looking forward to his pending discharge.  Jon Clayton told that his goal this week was to apply for disability upon discharge.  Pt rated his day a 7 out of 10 and his affect was appropriate.

## 2021-12-15 ENCOUNTER — Encounter (HOSPITAL_COMMUNITY): Payer: Self-pay

## 2021-12-15 MED ORDER — QUETIAPINE FUMARATE ER 400 MG PO TB24: 400.0000 mg | ORAL_TABLET | Freq: Every day | ORAL | 0 refills | Status: DC

## 2021-12-15 MED ORDER — MELATONIN 5 MG PO TABS: 5.0000 mg | ORAL_TABLET | Freq: Every day | ORAL | 0 refills | Status: AC

## 2021-12-15 MED ORDER — QUETIAPINE FUMARATE ER 200 MG PO TB24: 200.0000 mg | ORAL_TABLET | Freq: Every morning | ORAL | 0 refills | Status: DC

## 2021-12-15 MED ORDER — TRAZODONE HCL 50 MG PO TABS
50.0000 mg | ORAL_TABLET | Freq: Every evening | ORAL | 0 refills | Status: DC | PRN
Start: 2021-12-15 — End: 2022-07-05

## 2021-12-15 MED ORDER — HYDROXYZINE HCL 25 MG PO TABS: 25.0000 mg | ORAL_TABLET | Freq: Three times a day (TID) | ORAL | 0 refills | Status: AC | PRN

## 2021-12-15 MED ORDER — NICOTINE 14 MG/24HR TD PT24: 14.0000 mg | MEDICATED_PATCH | Freq: Every day | TRANSDERMAL | 0 refills | Status: AC

## 2021-12-15 MED ORDER — AMITRIPTYLINE HCL 25 MG PO TABS: 25.0000 mg | ORAL_TABLET | Freq: Every day | ORAL | 0 refills | Status: DC

## 2021-12-15 MED ORDER — CLONIDINE HCL 0.1 MG PO TABS: 0.1000 mg | ORAL_TABLET | Freq: Two times a day (BID) | ORAL | 0 refills | Status: DC

## 2021-12-15 MED ORDER — QUETIAPINE FUMARATE ER 50 MG PO TB24
100.0000 mg | ORAL_TABLET | Freq: Every day | ORAL | 0 refills | Status: DC
Start: 2021-12-15 — End: 2022-07-05

## 2021-12-15 MED ORDER — BACITRACIN-NEOMYCIN-POLYMYXIN OINTMENT TUBE: 1.0000 | TOPICAL_OINTMENT | Freq: Two times a day (BID) | CUTANEOUS | Status: DC

## 2021-12-15 NOTE — Progress Notes (Signed)
Recreation Therapy Notes  INPATIENT RECREATION TR PLAN  Patient Details Name: Kaheem Halleck MRN: [id number] DOB: 08-Feb-1996 Today's Date: 12/15/2021  Rec Therapy Plan Is patient appropriate for Therapeutic Recreation?: Yes Treatment times per week: about 3 days Estimated Length of Stay: 5-7 days TR Treatment/Interventions: Group participation (Comment)  Discharge Criteria Pt will be discharged from therapy if:: Discharged Treatment plan/goals/alternatives discussed and agreed upon by:: Patient/family  Discharge Summary Short term goals set: See patient care plan Short term goals met: Not met Progress toward goals comments: Groups attended Which groups?: Self-esteem, Other (Comment) (Self expression) Reason goals not met: Pt attended two group sessions and participated in one. Therapeutic equipment acquired: N/A Reason patient discharged from therapy: Discharge from hospital Pt/family agrees with progress & goals achieved: Yes Date patient discharged from therapy: 12/15/21   Victorino Sparrow, Vickki Muff, Delayne Sanzo A 12/15/2021, 1:37 PM

## 2021-12-15 NOTE — BH IP Treatment Plan (Signed)
Interdisciplinary Treatment and Diagnostic Plan Update  12/15/2021 Time of Session: 10:30am  Alexiz Sustaita MRN: [id number]  Principal Diagnosis: MDD (major depressive disorder), recurrent, severe, with psychosis (HCC)  Secondary Diagnoses: Principal Problem:   MDD (major depressive disorder), recurrent, severe, with psychosis (HCC) Active Problems:   PTSD (post-traumatic stress disorder)   Cannabis use disorder   Fibromyalgia   Personality disorder (HCC)   At high risk for self harm   Adult antisocial behavior   Suicidal behavior   Current Medications:  Current Facility-Administered Medications  Medication Dose Route Frequency Provider Last Rate Last Admin   acetaminophen (TYLENOL) tablet 650 mg  650 mg Oral Q6H PRN White, Patrice L, NP   650 mg at 12/09/21 0837   alum & mag hydroxide-simeth (MAALOX/MYLANTA) 200-200-20 MG/5ML suspension 30 mL  30 mL Oral Q4H PRN White, Patrice L, NP       amitriptyline (ELAVIL) tablet 25 mg  25 mg Oral QHS Hill, Shelbie Hutching, MD   25 mg at 12/14/21 2101   cloNIDine (CATAPRES) tablet 0.1 mg  0.1 mg Oral Q12H Lamar Sprinkles, MD   0.1 mg at 12/15/21 0836   hydrOXYzine (ATARAX) tablet 25 mg  25 mg Oral TID PRN White, Patrice L, NP   25 mg at 12/15/21 0831   magnesium hydroxide (MILK OF MAGNESIA) suspension 30 mL  30 mL Oral Daily PRN White, Patrice L, NP       melatonin tablet 5 mg  5 mg Oral QHS Hill, Shelbie Hutching, MD   5 mg at 12/14/21 2101   neomycin-bacitracin-polymyxin (NEOSPORIN) ointment   Topical BID Bobbitt, Shalon E, NP   1 Application at 12/15/21 0846   nicotine (NICODERM CQ - dosed in mg/24 hours) patch 14 mg  14 mg Transdermal Daily Hill, Shelbie Hutching, MD   14 mg at 12/14/21 0817   OLANZapine (ZYPREXA) injection 10 mg  10 mg Intramuscular Q6H PRN Roselle Locus, MD   10 mg at 12/04/21 1046   ondansetron (ZOFRAN-ODT) disintegrating tablet 4 mg  4 mg Oral Q8H PRN Roselle Locus, MD       QUEtiapine (SEROQUEL XR) 24 hr  tablet 100 mg  100 mg Oral Daily Massengill, Nathan, MD   100 mg at 12/14/21 1426   QUEtiapine (SEROQUEL XR) 24 hr tablet 200 mg  200 mg Oral Daily Massengill, Nathan, MD   200 mg at 12/15/21 0830   QUEtiapine (SEROQUEL XR) 24 hr tablet 400 mg  400 mg Oral QPC supper Roselle Locus, MD   400 mg at 12/14/21 1819   QUEtiapine (SEROQUEL) tablet 100 mg  100 mg Oral TID PRN Phineas Inches, MD   100 mg at 12/14/21 2101   traZODone (DESYREL) tablet 50 mg  50 mg Oral QHS PRN Phineas Inches, MD   50 mg at 12/13/21 2051   PTA Medications: Medications Prior to Admission  Medication Sig Dispense Refill Last Dose   busPIRone (BUSPAR) 15 MG tablet Take 1 tablet (15 mg total) by mouth 2 (two) times daily. 60 tablet 0    cloNIDine (CATAPRES) 0.1 MG tablet Take 1 tablet (0.1 mg total) by mouth every 12 (twelve) hours. 60 tablet 0    DULoxetine (CYMBALTA) 60 MG capsule Take 1 capsule (60 mg total) by mouth daily. 30 capsule 0    neomycin-bacitracin-polymyxin [phone] OINT Apply 1 application. topically daily as needed (apply to excoriation).  0    nicotine (NICODERM CQ - DOSED IN MG/24 HOURS) 21 mg/24hr patch Place 1 patch (  21 mg total) onto the skin daily for 28 days. (Patient not taking: Reported on 11/22/2021) 28 patch 0    QUEtiapine (SEROQUEL) 200 MG tablet Take 1 tablet (200 mg total) by mouth 2 (two) times daily. 60 tablet 0    QUEtiapine (SEROQUEL) 400 MG tablet Take 1 tablet (400 mg total) by mouth at bedtime. 30 tablet 0    [DISCONTINUED] hydrOXYzine (ATARAX) 25 MG tablet Take 1 tablet (25 mg total) by mouth 3 (three) times daily as needed for anxiety. 30 tablet 0    [DISCONTINUED] traZODone (DESYREL) 50 MG tablet Take 1 tablet (50 mg total) by mouth at bedtime as needed for sleep. 30 tablet 0     Patient Stressors: Neurosurgeon issue   Medication change or noncompliance    Patient Strengths: Ability for insight  Capable of independent living  Physical Health    Treatment Modalities: Medication Management, Group therapy, Case management,  1 to 1 session with clinician, Psychoeducation, Recreational therapy.   Physician Treatment Plan for Primary Diagnosis: MDD (major depressive disorder), recurrent, severe, with psychosis (HCC) Long Term Goal(s): Improvement in symptoms so as ready for discharge   Short Term Goals: Ability to identify changes in lifestyle to reduce recurrence of condition will improve Ability to verbalize feelings will improve Ability to disclose and discuss suicidal ideas Ability to demonstrate self-control will improve Ability to identify and develop effective coping behaviors will improve Ability to maintain clinical measurements within normal limits will improve Compliance with prescribed medications will improve Ability to identify triggers associated with substance abuse/mental health issues will improve  Medication Management: Evaluate patient's response, side effects, and tolerance of medication regimen.  Therapeutic Interventions: 1 to 1 sessions, Unit Group sessions and Medication administration.  Evaluation of Outcomes: Adequate for Discharge  Physician Treatment Plan for Secondary Diagnosis: Principal Problem:   MDD (major depressive disorder), recurrent, severe, with psychosis (HCC) Active Problems:   PTSD (post-traumatic stress disorder)   Cannabis use disorder   Fibromyalgia   Personality disorder (HCC)   At high risk for self harm   Adult antisocial behavior   Suicidal behavior  Long Term Goal(s): Improvement in symptoms so as ready for discharge   Short Term Goals: Ability to identify changes in lifestyle to reduce recurrence of condition will improve Ability to verbalize feelings will improve Ability to disclose and discuss suicidal ideas Ability to demonstrate self-control will improve Ability to identify and develop effective coping behaviors will improve Ability to maintain clinical  measurements within normal limits will improve Compliance with prescribed medications will improve Ability to identify triggers associated with substance abuse/mental health issues will improve     Medication Management: Evaluate patient's response, side effects, and tolerance of medication regimen.  Therapeutic Interventions: 1 to 1 sessions, Unit Group sessions and Medication administration.  Evaluation of Outcomes: Adequate for Discharge   RN Treatment Plan for Primary Diagnosis: MDD (major depressive disorder), recurrent, severe, with psychosis (HCC) Long Term Goal(s): Knowledge of disease and therapeutic regimen to maintain health will improve  Short Term Goals: Ability to remain free from injury will improve, Ability to participate in decision making will improve, Ability to verbalize feelings will improve, Ability to disclose and discuss suicidal ideas, and Ability to identify and develop effective coping behaviors will improve  Medication Management: RN will administer medications as ordered by provider, will assess and evaluate patient's response and provide education to patient for prescribed medication. RN will report any adverse and/or side effects to prescribing  provider.  Therapeutic Interventions: 1 on 1 counseling sessions, Psychoeducation, Medication administration, Evaluate responses to treatment, Monitor vital signs and CBGs as ordered, Perform/monitor CIWA, COWS, AIMS and Fall Risk screenings as ordered, Perform wound care treatments as ordered.  Evaluation of Outcomes: Adequate for Discharge   LCSW Treatment Plan for Primary Diagnosis: MDD (major depressive disorder), recurrent, severe, with psychosis ([place]) Long Term Goal(s): Safe transition to appropriate next level of care at discharge, Engage patient in therapeutic group addressing interpersonal concerns.  Short Term Goals: Engage patient in aftercare planning with referrals and resources, Increase social support,  Increase emotional regulation, Facilitate acceptance of mental health diagnosis and concerns, Identify triggers associated with mental health/substance abuse issues, and Increase skills for wellness and recovery  Therapeutic Interventions: Assess for all discharge needs, 1 to 1 time with Social worker, Explore available resources and support systems, Assess for adequacy in community support network, Educate family and significant other(s) on suicide prevention, Complete Psychosocial Assessment, Interpersonal group therapy.  Evaluation of Outcomes: Adequate for Discharge   Progress in Treatment: Attending groups: No. Participating in groups: No. Taking medication as prescribed: Yes. Toleration medication: Yes. Family/Significant other contact made: Yes, individual(s) contacted:  PO officer Patient understands diagnosis: Yes. Discussing patient identified problems/goals with staff: Yes. Medical problems stabilized or resolved: Yes. Denies suicidal/homicidal ideation: Yes. Issues/concerns per patient self-inventory: No.   New problem(s) identified: No, Describe:  none reported   New Short Term/Long Term Goal(s):    medication stabilization, elimination of SI thoughts, development of comprehensive mental wellness plan.      Patient Goals: Patient continues to work on goals stated in initial treatment team: Medication stabilization, elimination of SI thoughts, development of comprehensive mental wellness plan.       Discharge Plan or Barriers: patient was discharge to a shelter and will follow up with [facility] in [facility] for therapy and medication management.    Reason for Continuation of Hospitalization: Medication stabilization   Estimated Length of Stay: Adequate for Discharge    Last [place] Suicide Severity Risk Score: [facility] Admission (Current) from 11/25/2021 in [facility] [facility] ED from 11/22/2021 in [facility] Admission (Discharged) from 11/09/2021 in [place] [facility]  C-SSRS RISK CATEGORY High Risk High Risk High Risk       Last PHQ 2/9 Scores:    11/08/2021   11:57 AM 11/05/2021    3:57 AM 11/04/2021    6:30 AM  Depression screen PHQ 2/9  Decreased Interest 2 3 3   Down, Depressed, Hopeless 2 3 3   PHQ - 2 Score 4 6 6   Altered sleeping 3 3 3   Tired, decreased energy 3 3 3   Change in appetite 3 2 3   Feeling bad or failure about yourself  3 3 3   Trouble concentrating 3 2 2   Moving slowly or fidgety/restless 3 2 2   Suicidal thoughts 3 3 3   PHQ-9 Score 25 24 25   Difficult doing work/chores Very difficult Extremely dIfficult Extremely dIfficult    Scribe for Treatment Team: Darleen Crocker, Latanya Presser 12/15/2021 11:48 AM

## 2021-12-15 NOTE — Plan of Care (Signed)
Patient attended two recreation therapy group sessions and participated in one at completion of recreation therapy group sessions.   Caroll Rancher, LRT,CTRS

## 2021-12-15 NOTE — Progress Notes (Signed)
   12/15/21 0500  Sleep  Number of Hours 6.75

## 2021-12-15 NOTE — Progress Notes (Signed)
Nursing 1:1 note D:Pt observed sleeping in bed with eyes closed. RR even and unlabored. No distress noted. A: 1:1 observation continues for safety  R: pt remains safe  

## 2021-12-15 NOTE — [facility] Suicide Risk Assessment (Signed)
[name] Discharge Suicide Risk Assessment   Principal Problem: MDD (major depressive disorder), recurrent, severe, with psychosis (HCC) Discharge Diagnoses: Principal Problem:   MDD (major depressive disorder), recurrent, severe, with psychosis (HCC) Active Problems:   PTSD (post-traumatic stress disorder)   Cannabis use disorder   Fibromyalgia   Personality disorder (HCC)   At high risk for self harm   Adult antisocial behavior   Suicidal behavior   Total Time spent with patient: 20 minutes   Jon Clayton is a 26 y.o., male with a past psychiatric history significant for depression, PTSD, substance use who presented to the [facility] from ER for evaluation and management of depression and PTSD initially on 11/11/2021. He was in [facility] for 7 days and discharged on 11/18/2021 to [facility] in a [facility]. The next day he went out and put in some new job applications. He got a phone call from one of his previous applications where he was notified that he did not pass his background check due to his sexual offender status and "it all kind of rushed into my head at once" and he became suicidal.  During the patient's hospitalization, patient had extensive initial psychiatric evaluation, and follow-up psychiatric evaluations every day.   Psychiatric diagnoses provided upon initial assessment:  -MDD with psychotic features (r/o schizoaffective d/o depressive type)  -Rule out OCD vs (and/or) GAD  -(per history): PTSD, polysubstance use, dissociative disorder   Patient's psychiatric medications were adjusted on admission:  Start Zydis 5mg  PO QHS - melt tab less likely to be lost due to vomiting.  Start Remeron prn for sleep Start Elavil 25mg  PO QHS for mood, anxiety and fibromyalgia.  Stop cymbalta.     During the hospitalization, other adjustments were made to the patient's psychiatric medication regimen:  -Zyrexa was changed to seroquel  -Seroquel was titrated to dose  XR 200 mg qam, XR 100 mg 2pm, and XR 400 mg at bedtime   Patient's care was discussed during the interdisciplinary team meeting every day during the hospitalization.   The patient denied having side effects to prescribed psychiatric medication.   Gradually, patient started adjusting to milieu. The patient was evaluated each day by a clinical provider to ascertain response to treatment. Improvement was noted by the patient's report of decreasing symptoms, improved sleep and appetite, affect, medication tolerance, behavior, and participation in unit programming.  Patient was asked each day to complete a self inventory noting mood, mental status, pain, new symptoms, anxiety and concerns.     Symptoms were reported as significantly decreased or resolved completely by discharge.    On day of discharge, the patient reports that their mood is stable. The patient denied having suicidal thoughts for more than 48 hours prior to discharge.  Patient denies having homicidal thoughts, which was explored carefully.  Patient denies having auditory hallucinations.  Patient denies any visual hallucinations or other symptoms of psychosis. The patient was motivated to continue taking medication with a goal of continued improvement in mental health.    The patient reports their target psychiatric symptoms of depression, suicidal thoughts, and homicidal thoughts, all responded well to the psychiatric medications, and the patient reports overall benefit other psychiatric hospitalization. Supportive psychotherapy was provided to the patient. The patient also participated in regular group therapy while hospitalized. Coping skills, problem solving as well as relaxation therapies were also part of the unit programming.   Labs were reviewed with the patient, and abnormal results were discussed with  the patient.   The patient is able to verbalize their individual safety plan to this provider.   # It is recommended to the  patient to continue psychiatric medications as prescribed, after discharge from the hospital.     # It is recommended to the patient to follow up with your outpatient psychiatric provider and PCP.   # It was discussed with the patient, the impact of alcohol, drugs, tobacco have been there overall psychiatric and medical wellbeing, and total abstinence from substance use was recommended the patient.ed.   # Prescriptions provided or sent directly to preferred pharmacy at discharge. Patient agreeable to plan. Given opportunity to ask questions. Appears to feel comfortable with discharge.    # In the event of worsening symptoms, the patient is instructed to call the crisis hotline, 911 and or go to the nearest ED for appropriate evaluation and treatment of symptoms. To follow-up with primary care provider for other medical issues, concerns and or health care needs   # Patient was discharged shelter in [facility], with a plan to follow up as noted below.       Psychiatric Specialty Exam  Presentation  General Appearance: Appropriate for Environment; Casual; Fairly Groomed  Eye Contact:Good  Speech:Clear and Coherent; Normal Rate  Speech Volume:Normal  Handedness:Right   Mood and Affect  Mood:Euthymic; Anxious  Duration of Depression Symptoms: Greater than two weeks  Affect:Appropriate; Congruent; Full Range   Thought Process  Thought Processes:Linear  Descriptions of Associations:Intact  Orientation:Full (Time, Place and Person)  Thought Content:Logical  History of Schizophrenia/Schizoaffective disorder:No  Duration of Psychotic Symptoms:Greater than six months  Hallucinations:Hallucinations: None  Ideas of Reference:None  Suicidal Thoughts:Suicidal Thoughts: No  Homicidal Thoughts:Homicidal Thoughts: No (denies HI. denies thoughts to harm anyone.)   Sensorium  Memory:Immediate Good; Recent Good; Remote Good  Judgment:Fair  Insight:Fair   Executive  Functions  Concentration:Good  Attention Span:Good  Recall:Good  Fund of Knowledge:Good  Language:Good   Psychomotor Activity  Psychomotor Activity:Psychomotor Activity: Normal (no eps on day of dc. aims score zero on day of dc.)   Assets  Assets:Communication Skills; Desire for Improvement; Physical Health; Resilience   Sleep  Sleep:Sleep: Good   Physical Exam: Physical Exam See discharge summary  ROS See discharge summary   Blood pressure 117/73, pulse 83, temperature 97.8 F (36.6 C), temperature source Oral, resp. rate 20, height 5\' 7"  (1.702 m), weight 75.3 kg, SpO2 100 %. Body mass index is 26 kg/m.  Mental Status Per Nursing Assessment::   On Admission:  Suicidal ideation indicated by patient, Self-harm thoughts, Thoughts of violence towards others, Self-harm behaviors, Intention to act on suicide plan, Belief that plan would result in death  Demographic factors:  Male, Low socioeconomic status, Living alone, Unemployed, Adolescent or young adult, Caucasian Loss Factors:  Decrease in vocational status, Financial problems / change in socioeconomic status Historical Factors:  Prior suicide attempts, Impulsivity Risk Reduction Factors:   Positive coping skills or problem solving skills  Continued Clinical Symptoms:  MDD - mood is stable. No psychotic symptoms reported. Denies SI. Denies HI.   Cognitive Features That Contribute To Risk:  None    Suicide Risk:  Mild:  There are no identifiable suicide plans, no associated intent, mild dysphoria and related symptoms, good self-control (both objective and subjective assessment), few other risk factors, and identifiable protective factors, including available and accessible social support.    Follow-up Information     [facility]. Go on 12/17/2021.  Why: You have a hospital follow up appointment for therapy and medication management services on 12/17/21 at 1:00 pm.   This appointment will be held  in person. Contact information: [address] [place] [address] [phone]                 Plan Of Care/Follow-up recommendations:   Activity: as tolerated   Diet: heart healthy   Other: -Follow-up with your outpatient psychiatric provider -instructions on appointment date, time, and address (location) are provided to you in discharge paperwork.   -Take your psychiatric medications as prescribed at discharge - instructions are provided to you in the discharge paperwork   -Follow-up with outpatient primary care doctor and other specialists -for management of chronic medical disease.   -Testing: Follow-up with outpatient provider for abnormal lab results: none   -Recommend abstinence from alcohol, tobacco, and other illicit drug use at discharge.    -If your psychiatric symptoms recur, worsen, or if you have side effects to your psychiatric medications, call your outpatient psychiatric provider, 911, [phone] or go to the nearest emergency department.   -If suicidal thoughts recur, call your outpatient psychiatric provider, 911, [phone] or go to the nearest emergency department.    Cristy Hilts, MD 12/15/2021, 9:57 AM

## 2021-12-15 NOTE — Discharge Summary (Signed)
Physician Discharge Summary Note  Patient:  Jon Clayton is an 26 y.o., male MRN:  [id number] DOB:  02-Jan-1996 Patient phone:  [phone] (home)  Patient address:   [address] [place] [address],  Total Time spent with patient: 20 minutes  Date of Admission:  11/25/2021 Date of Discharge: 12-15-2021  Reason for Admission:   Jon Clayton is a 26 y.o., male with a past psychiatric history significant for depression, PTSD, substance use who presented to the [facility] from ER for evaluation and management of depression and PTSD initially on 11/11/2021. He was in [facility] for 7 days and discharged on 11/18/2021 to his [facility] in a [facility]. He did not fill his medications because he had a few pills left from his last medications. The next day he went out and put in some new job applications. He got a phone call from one of his previous applications where he was notified that he did not pass his background check due to his sexual offender status and "it all kind of rushed into my head at once" and he became suicidal.  Principal Problem: MDD (major depressive disorder), recurrent, severe, with psychosis (HCC) Discharge Diagnoses: Principal Problem:   MDD (major depressive disorder), recurrent, severe, with psychosis (HCC) Active Problems:   PTSD (post-traumatic stress disorder)   Cannabis use disorder   Fibromyalgia   Personality disorder (HCC)   At high risk for self harm   Adult antisocial behavior   Suicidal behavior   Past Psychiatric History:  Previous Psychiatric Diagnoses: MDD with psychotic features, PTSD, polysubstance use, dissociative disorder Current / Past Mental Health Providers: Denies Prior Inpatient or Outpatient Therapy: Reports multiple psychiatric hospitalization at least 7 in the past 12 months in different facilities  Most recently at [facility] 11/18/2021  Past Suicide Attempts: Reports at least 10 times attempted to harm himself last time  by cutting his wrist using plastic spoon in May 2022 when he was in jail Past History of Homicidal Behaviors / Violent or Aggressive Behaviors: Denies History of Self-Mutilation: Vaguely reports self cutting but vague about details Previous Participation in PHP/IOP or Residential Programs: Denies Past History of ECT / TMS / VNS: Denies Past Psychotropic Medication Trials: Reports multiple medication treatment  Past Medical History:  Past Medical History:  Diagnosis Date   Depression    Psychosis (HCC)     Past Surgical History:  Procedure Laterality Date   NASAL SINUS SURGERY Bilateral    TONSILLECTOMY     Family History: History reviewed. No pertinent family history.  Family Psychiatric  History:  Reports maternal uncle committed suicide, reports multiple family members with bipolar disorder and schizophrenia       Social History:  Social History   Substance and Sexual Activity  Alcohol Use Not Currently   Comment: reports periodic alcohol use     Social History   Substance and Sexual Activity  Drug Use Yes   Frequency: 1.0 times per week   Types: Marijuana   Comment: 1/3 gm daily    Social History   Socioeconomic History   Marital status: Single    Spouse name: Not on file   Number of children: Not on file   Years of education: Not on file   Highest education level: 8th grade  Occupational History   Not on file  Tobacco Use   Smoking status: Every Day    Packs/day: 1.00    Years: 11.00    Total pack years: 11.00  Types: Cigarettes   Smokeless tobacco: Never  Substance and Sexual Activity   Alcohol use: Not Currently    Comment: reports periodic alcohol use   Drug use: Yes    Frequency: 1.0 times per week    Types: Marijuana    Comment: 1/3 gm daily   Sexual activity: Not Currently  Other Topics Concern   Not on file  Social History Narrative   Not on file   Social Determinants of Health   Financial Resource Strain: Not on file  Food  Insecurity: Not on file  Transportation Needs: Not on file  Physical Activity: Not on file  Stress: Not on file  Social Connections: Not on file    Hospital Course:    During the patient's hospitalization, patient had extensive initial psychiatric evaluation, and follow-up psychiatric evaluations every day.  Psychiatric diagnoses provided upon initial assessment:  -MDD with psychotic features (r/o schizoaffective d/o depressive type)  -Rule out OCD vs (and/or) GAD  -(per history): PTSD, polysubstance use, dissociative disorder  Patient's psychiatric medications were adjusted on admission:  Start Zydis  PO QHS - melt tab less likely to be lost due to vomiting.  Start Remeron prn for sleep Start Elavil  PO QHS for mood, anxiety and fibromyalgia.  Stop cymbalta.    During the hospitalization, other adjustments were made to the patient's psychiatric medication regimen:  -Zyrexa was changed to seroquel  -Seroquel was titrated to dose XR 200 mg qam, XR 100 mg 2pm, and XR 400 mg at bedtime  Patient's care was discussed during the interdisciplinary team meeting every day during the hospitalization.  The patient denied having side effects to prescribed psychiatric medication.  Gradually, patient started adjusting to milieu. The patient was evaluated each day by a clinical provider to ascertain response to treatment. Improvement was noted by the patient's report of decreasing symptoms, improved sleep and appetite, affect, medication tolerance, behavior, and participation in unit programming.  Patient was asked each day to complete a self inventory noting mood, mental status, pain, new symptoms, anxiety and concerns.    Symptoms were reported as significantly decreased or resolved completely by discharge.   On day of discharge, the patient reports that their mood is stable. The patient denied having suicidal thoughts for more than 48 hours prior to discharge.  Patient denies having  homicidal thoughts, which was explored carefully.  Patient denies having auditory hallucinations.  Patient denies any visual hallucinations or other symptoms of psychosis. The patient was motivated to continue taking medication with a goal of continued improvement in mental health.   The patient reports their target psychiatric symptoms of depression, suicidal thoughts, and homicidal thoughts, all responded well to the psychiatric medications, and the patient reports overall benefit other psychiatric hospitalization. Supportive psychotherapy was provided to the patient. The patient also participated in regular group therapy while hospitalized. Coping skills, problem solving as well as relaxation therapies were also part of the unit programming.  Labs were reviewed with the patient, and abnormal results were discussed with the patient.  The patient is able to verbalize their individual safety plan to this provider.  # It is recommended to the patient to continue psychiatric medications as prescribed, after discharge from the hospital.    # It is recommended to the patient to follow up with your outpatient psychiatric provider and PCP.  # It was discussed with the patient, the impact of alcohol, drugs, tobacco have been there overall psychiatric and medical wellbeing, and total abstinence from substance  use was recommended the patient.ed.  # Prescriptions provided or sent directly to preferred pharmacy at discharge. Patient agreeable to plan. Given opportunity to ask questions. Appears to feel comfortable with discharge.    # In the event of worsening symptoms, the patient is instructed to call the crisis hotline, 911 and or go to the nearest ED for appropriate evaluation and treatment of symptoms. To follow-up with primary care provider for other medical issues, concerns and or health care needs  # Patient was discharged shelter in [facility], with a plan to follow up as noted below.   Physical  Findings: AIMS: Facial and Oral Movements Muscles of Facial Expression: None, normal Lips and Perioral Area: None, normal Jaw: None, normal Tongue: None, normal,Extremity Movements Upper (arms, wrists, hands, fingers): None, normal Lower (legs, knees, ankles, toes): None, normal, Trunk Movements Neck, shoulders, hips: None, normal, Overall Severity Severity of abnormal movements (highest score from questions above): None, normal Incapacitation due to abnormal movements: None, normal Patient's awareness of abnormal movements (rate only patient's report): No Awareness, Dental Status Current problems with teeth and/or dentures?: No Does patient usually wear dentures?: No  CIWA:    COWS:     Musculoskeletal: Strength & Muscle Tone: within normal limits Gait & Station: normal Patient leans: N/A   Psychiatric Specialty Exam:  Presentation  General Appearance: Appropriate for Environment; Casual; Fairly Groomed  Eye Contact:Good  Speech:Clear and Coherent; Normal Rate  Speech Volume:Normal  Handedness:Right   Mood and Affect  Mood:Euthymic; Anxious  Affect:Appropriate; Congruent; Full Range   Thought Process  Thought Processes:Linear  Descriptions of Associations:Intact  Orientation:Full (Time, Place and Person)  Thought Content:Logical  History of Schizophrenia/Schizoaffective disorder:No  Duration of Psychotic Symptoms:Greater than six months  Hallucinations:Hallucinations: None  Ideas of Reference:None  Suicidal Thoughts:Suicidal Thoughts: No  Homicidal Thoughts:Homicidal Thoughts: No (denies HI. denies thoughts to harm anyone.)   Sensorium  Memory:Immediate Good; Recent Good; Remote Good  Judgment:Fair  Insight:Fair   Executive Functions  Concentration:Good  Attention Span:Good  Recall:Good  Fund of Knowledge:Good  Language:Good   Psychomotor Activity  Psychomotor Activity:Psychomotor Activity: Normal (no eps on day of dc. aims score  zero on day of dc.)   Assets  Assets:Communication Skills; Desire for Improvement; Physical Health; Resilience   Sleep  Sleep:Sleep: Good    Physical Exam: Physical Exam Vitals reviewed.  Pulmonary:     Effort: Pulmonary effort is normal.  Neurological:     Mental Status: He is alert.     Motor: No weakness.     Gait: Gait normal.  Psychiatric:        Mood and Affect: Mood normal.        Behavior: Behavior normal.        Thought Content: Thought content normal.    Review of Systems  Constitutional:  Negative for chills and fever.  Cardiovascular:  Negative for chest pain and palpitations.  Neurological:  Negative for dizziness, tingling, tremors and headaches.  Psychiatric/Behavioral:  Negative for depression, hallucinations, memory loss, substance abuse and suicidal ideas. The patient is nervous/anxious. The patient does not have insomnia.   All other systems reviewed and are negative.  Blood pressure 117/73, pulse 83, temperature 97.8 F (36.6 C), temperature source Oral, resp. rate 20, height 5\' 7"  (1.702 m), weight 75.3 kg, SpO2 100 %. Body mass index is 26 kg/m.   Social History   Tobacco Use  Smoking Status Every Day   Packs/day: 1.00   Years: 11.00   Total pack years:  11.00   Types: Cigarettes  Smokeless Tobacco Never   Tobacco Cessation:  A prescription for an FDA-approved tobacco cessation medication provided at discharge   Blood Alcohol level:  Lab Results  Component Value Date   [facility] <10 11/22/2021   ETH <10 11/04/2021    Metabolic Disorder Labs:  Lab Results  Component Value Date   HGBA1C 4.9 11/10/2021   MPG 93.93 11/10/2021   No results found for: "PROLACTIN" Lab Results  Component Value Date   CHOL 111 11/10/2021   TRIG 98 11/10/2021   HDL 36 (L) 11/10/2021   CHOLHDL 3.1 11/10/2021   VLDL 20 11/10/2021   LDLCALC 55 11/10/2021    See Psychiatric Specialty Exam and Suicide Risk Assessment completed by Attending Physician prior to  discharge.  Discharge destination:  Other:  shelter in [place]  Is patient on multiple antipsychotic therapies at discharge:  No   Has Patient had three or more failed trials of antipsychotic monotherapy by history:  No  Recommended Plan for Multiple Antipsychotic Therapies: NA  Discharge Instructions     Diet - low sodium heart healthy   Complete by: As directed    Increase activity slowly   Complete by: As directed       Allergies as of 12/15/2021       Reactions   Pertussis Vaccines Other (See Comments)   Ped. MD said he was allergic to a medication in the vaccine.   Amoxicillin Rash   Blue Mussel [mytilus] Rash   Itchy throat   Penicillins Rash        Medication List     STOP taking these medications    busPIRone 15 MG tablet Commonly known as: BUSPAR   DULoxetine 60 MG capsule Commonly known as: CYMBALTA   nicotine 21 mg/24hr patch Commonly known as: NICODERM CQ - dosed in mg/24 hours Replaced by: nicotine 14 mg/24hr patch   QUEtiapine 200 MG tablet Commonly known as: SEROQUEL Replaced by: QUEtiapine 200 MG 24 hr tablet   QUEtiapine 400 MG tablet Commonly known as: SEROQUEL Replaced by: QUEtiapine 400 MG 24 hr tablet       TAKE these medications      Indication  amitriptyline 25 MG tablet Commonly known as: ELAVIL Take 1 tablet (25 mg total) by mouth at bedtime.  Indication: Depression   cloNIDine 0.1 MG tablet Commonly known as: CATAPRES Take 1 tablet (0.1 mg total) by mouth every 12 (twelve) hours.  Indication: impulse control   hydrOXYzine 25 MG tablet Commonly known as: ATARAX Take 1 tablet (25 mg total) by mouth 3 (three) times daily as needed for anxiety.  Indication: Feeling Anxious   melatonin 5 MG Tabs Take 1 tablet (5 mg total) by mouth at bedtime.  Indication: Trouble Sleeping   neomycin-bacitracin-polymyxin Oint Commonly known as: NEOSPORIN Apply 1 Application topically 2 (two) times daily. What changed:  medication  strength when to take this reasons to take this  Indication: abrasion   nicotine 14 mg/24hr patch Commonly known as: NICODERM CQ - dosed in mg/24 hours Place 1 patch (14 mg total) onto the skin daily for 28 days. Start taking on: December 16, 2021 Replaces: nicotine 21 mg/24hr patch  Indication: Nicotine Addiction   QUEtiapine 400 MG 24 hr tablet Commonly known as: SEROQUEL XR Take 1 tablet (400 mg total) by mouth daily after supper. Replaces: QUEtiapine 400 MG tablet  Indication: Major Depressive Disorder   QUEtiapine 200 MG 24 hr tablet Commonly known as: SEROQUEL XR Take  1 tablet (200 mg total) by mouth in the morning. Replaces: QUEtiapine 200 MG tablet  Indication: Major Depressive Disorder   QUEtiapine 50 MG Tb24 24 hr tablet Commonly known as: SEROQUEL XR Take 2 tablets (100 mg total) by mouth daily after lunch.  Indication: Major Depressive Disorder   traZODone 50 MG tablet Commonly known as: DESYREL Take 1 tablet (50 mg total) by mouth at bedtime as needed for sleep.  Indication: Trouble Sleeping, Major Depressive Disorder        Follow-up Information     [facility]. Go on 12/17/2021.   Why: You have a hospital follow up appointment for therapy and medication management services on 12/17/21 at 1:00 pm.   This appointment will be held in person. Contact information: [address] [place] [address] [phone]                 Follow-up recommendations:   Activity: as tolerated  Diet: heart healthy  Other: -Follow-up with your outpatient psychiatric provider -instructions on appointment date, time, and address (location) are provided to you in discharge paperwork.  -Take your psychiatric medications as prescribed at discharge - instructions are provided to you in the discharge paperwork  -Follow-up with outpatient primary care doctor and other specialists -for management of chronic medical disease.  -Testing: Follow-up with  outpatient provider for abnormal lab results: none  -Recommend abstinence from alcohol, tobacco, and other illicit drug use at discharge.   -If your psychiatric symptoms recur, worsen, or if you have side effects to your psychiatric medications, call your outpatient psychiatric provider, 911, [phone] or go to the nearest emergency department.  -If suicidal thoughts recur, call your outpatient psychiatric provider, 911, [phone] or go to the nearest emergency department.    Signed: Cristy Hilts, MD 12/15/2021, 9:51 AM   Total Time Spent in Direct Patient Care:  I personally spent 35 minutes on the unit in direct patient care. The direct patient care time included face-to-face time with the patient, reviewing the patient's chart, communicating with other professionals, and coordinating care. Greater than 50% of this time was spent in counseling or coordinating care with the patient regarding goals of hospitalization, psycho-education, and discharge planning needs.   Phineas Inches, MD Psychiatrist

## 2021-12-15 NOTE — Progress Notes (Signed)
  [facility] Adult Case Management Discharge Plan :  Will you be returning to the same living situation after discharge:  No. Patient will be going to a shelter, [facility]  At discharge, do you have transportation home?: Yes,  patient will get a taxi Do you have the ability to pay for your medications: Yes,  insurance  Release of information consent forms completed and in the chart;  Patient's signature needed at discharge.  Patient to Follow up at:  Follow-up Information     [facility]. Go on 12/17/2021.   Why: You have a hospital follow up appointment for therapy and medication management services on 12/17/21 at 1:00 pm.   This appointment will be held in person. Contact information: [address] [place] [address] [phone]                 Next level of care provider has access to [facility] Link:no  Safety Planning and Suicide Prevention discussed: Yes,  PO officer, Raquel James     Has patient been referred to the Quitline?: Patient refused referral, CSW provided patient with pamphlet for follow up if he changes his mind.  Patient has been referred for addiction treatment: N/A  Eular Panek E Fedra Lanter, LCSW 12/15/2021, 9:50 AM

## 2021-12-15 NOTE — Progress Notes (Signed)
D: Pt A & O X 4. Denies SI, HI, AVH and pain at this time. D/C to [facility] as ordered. Picked up by taxi cab.  A: D/C instructions reviewed with pt including prescriptions and follow up appointment; compliance encouraged. All belongings from locker 47 given to pt at time of departure. Scheduled and PRN medications given with verbal education and effects monitored. Safety checks maintained without incident till time of d/c.  R: Pt receptive to care. Compliant with medications when offered. Denies adverse drug reactions when assessed. Verbalized understanding related to d/c instructions. Signed belonging sheet in agreement with items received from locker. Ambulatory with a steady gait. Appears to be in no physical distress at time of departure.

## 2022-04-26 ENCOUNTER — Other Ambulatory Visit: Payer: Self-pay

## 2022-04-26 ENCOUNTER — Emergency Department (HOSPITAL_COMMUNITY)
Admission: EM | Admit: 2022-04-26 | Discharge: 2022-04-27 | Disposition: A | Discharge status: Other Acute Inpatient Hospital | Payer: Medicaid Other | Attending: Emergency Medicine | Admitting: Emergency Medicine

## 2022-04-26 ENCOUNTER — Ambulatory Visit (HOSPITAL_COMMUNITY)
Admission: AD | Admit: 2022-04-26 | Discharge: 2022-04-26 | Disposition: A | Discharge status: Home / Self Care | Payer: Medicaid Other | Attending: Psychiatry | Admitting: Psychiatry

## 2022-04-26 DIAGNOSIS — F1910 Other psychoactive substance abuse, uncomplicated: Secondary | ICD-10-CM

## 2022-04-26 DIAGNOSIS — F419 Anxiety disorder, unspecified: Secondary | ICD-10-CM | POA: Insufficient documentation

## 2022-04-26 DIAGNOSIS — Y900 Blood alcohol level of less than 20 mg/100 ml: Secondary | ICD-10-CM | POA: Insufficient documentation

## 2022-04-26 DIAGNOSIS — F1510 Other stimulant abuse, uncomplicated: Secondary | ICD-10-CM | POA: Diagnosis not present

## 2022-04-26 DIAGNOSIS — F1210 Cannabis abuse, uncomplicated: Secondary | ICD-10-CM | POA: Diagnosis not present

## 2022-04-26 DIAGNOSIS — R443 Hallucinations, unspecified: Secondary | ICD-10-CM | POA: Insufficient documentation

## 2022-04-26 LAB — CBC WITH DIFFERENTIAL/PLATELET
Abs Immature Granulocytes: 0.02 10*3/uL (ref 0.00–0.07)
Basophils Absolute: 0 10*3/uL (ref 0.0–0.1)
Basophils Relative: 0 %
Eosinophils Absolute: 0.1 10*3/uL (ref 0.0–0.5)
Eosinophils Relative: 1 %
HCT: 46.4 % (ref 39.0–52.0)
Hemoglobin: 15.4 g/dL (ref 13.0–17.0)
Immature Granulocytes: 0 %
Lymphocytes Relative: 46 %
Lymphs Abs: 4.3 10*3/uL — ABNORMAL HIGH (ref 0.7–4.0)
MCH: 29.3 pg (ref 26.0–34.0)
MCHC: 33.2 g/dL (ref 30.0–36.0)
MCV: 88.4 fL (ref 80.0–100.0)
Monocytes Absolute: 0.5 10*3/uL (ref 0.1–1.0)
Monocytes Relative: 6 %
Neutro Abs: 4.3 10*3/uL (ref 1.7–7.7)
Neutrophils Relative %: 47 %
Platelets: 250 10*3/uL (ref 150–400)
RBC: 5.25 MIL/uL (ref 4.22–5.81)
RDW: 14 % (ref 11.5–15.5)
WBC: 9.3 10*3/uL (ref 4.0–10.5)
nRBC: 0 % (ref 0.0–0.2)

## 2022-04-26 LAB — ETHANOL: Alcohol, Ethyl (B): <10 mg/dL (ref <10)

## 2022-04-26 LAB — RAPID URINE DRUG SCREEN, HOSP PERFORMED
Amphetamines: POSITIVE — AB
Barbiturates: NOT DETECTED
Benzodiazepines: NOT DETECTED
Cocaine: NOT DETECTED
Opiates: NOT DETECTED
Tetrahydrocannabinol: POSITIVE — AB

## 2022-04-26 LAB — COMPREHENSIVE METABOLIC PANEL
ALT: 14 U/L (ref 0–44)
AST: 15 U/L (ref 15–41)
Albumin: 4.5 g/dL (ref 3.5–5.0)
Alkaline Phosphatase: 48 U/L (ref 38–126)
Anion gap: 6 (ref 5–15)
BUN: 10 mg/dL (ref 6–20)
CO2: 29 mmol/L (ref 22–32)
Calcium: 9.5 mg/dL (ref 8.9–10.3)
Chloride: 105 mmol/L (ref 98–111)
Creatinine, Ser: 1.02 mg/dL (ref 0.61–1.24)
GFR, Estimated: >60 mL/min (ref >60)
Glucose, Bld: 86 mg/dL (ref 70–99)
Potassium: 3.8 mmol/L (ref 3.5–5.1)
Sodium: 140 mmol/L (ref 135–145)
Total Bilirubin: 0.6 mg/dL (ref 0.3–1.2)
Total Protein: 7 g/dL (ref 6.5–8.1)

## 2022-04-26 NOTE — H&P (Signed)
Behavioral Health Medical Screening Exam  HPI: Jon Clayton is a 26 y.o. Caucasian male who presents voluntarily as a walk-in to [facility] for complaint of worsening auditory hallucination for the past 3 days.  Patient has past psychiatric history significant for adult antisocial behavior, high risk for self-harm, cannabis use disorder, major depressive disorder, recurrent severe, with psychosis, personality disorder, polysubstance use disorder, PTSD, suicide behavior, and suicidal ideation.  Patient is familiar to [facility] and have several ED visits for mental health illness as well as homelessness.  He was admitted treated and discharged from [facility] in May 2023, and in June 2023.  Assessment: Patient is seen and examined sitting on a chair in the screen room.  Appears calm, quiet, disheveled and unkempt.  Able to participate in the exam .  Chart reviewed and findings shared with the treatment team and discussed with Dr. Lucianne Muss.  Alert and oriented x4, speech clear but soft in volume.  Mood is dysphoric and affect congruent.  Patient reported he has been hearing voices for the past 3 weeks and got worse the past 3 days.  Reports he is not sure for the triggers, "however a lot has been going in the community he lives at [employer]."  Reports hearing people talk about him and several children and women screaming.  Patient reports being on amitriptyline, Seroquel, and trazodone for his mental health problems.  However, "Dr. Leonard Schwartz" at [facility], took him off from these medications except for trazodone because of increase in his heart rate.  Patient reports that his mother is slowly dying and he does not want to be hospitalized at [facility] or any behavioral [facility] outside [place], [place].  Patient denies SI, HI, or VH.  Reports paranoia as indicated above, hearing people talk about him.  Endorses several suicide attempts in the past with last attempt in 2023 when he attempted to jump off  from the parking deck at [facility] and was stopped when his landlord called him on the phone.  Reports self-injurious behavior of cutting on his thigh 3 weeks ago.  Reports anxiety and rates as 8/10 with 10 being the worst.  Reports not being able to sleep last night due to racing thoughts.  Reports support system to be homeless shelter at [employer].  Reports family history of mental illness with father and mother diagnosed with schizophrenia.  Reports drinking 40 ounces of malt liquor daily, marijuana use of 1 g daily, tobacco use about 2 packs daily via pipes, vapes, or smoking.  Instruction provided on cessation of polysubstance uses as they adversely affect overall psychiatric and medical wellbeing patient nodded in agreement.  Denies access to firearms  Disposition: Based on my assessment of patient, he appears to be at risk to himself.  He meets the criteria for inpatient psychiatric admission.  Currently there is no bed availability at [facility], EDP at [facility] called and report provided.  Patient was transferred via safe transport to [facility] emergency room.  Total Time spent with patient: 30 minutes  Psychiatric Specialty Exam:  Presentation  General Appearance:  Bizarre; Disheveled  Eye Contact: Fleeting  Speech: Clear and Coherent  Speech Volume: Decreased  Handedness: Right  Mood and Affect  Mood: Anxious; Depressed Dysphoric  Affect: Congruent  Thought Process  Thought Processes: Coherent; Linear  Descriptions of Associations:Intact  Orientation:Full (Time, Place and Person)  Thought Content:Logical  History of Schizophrenia/Schizoaffective disorder:No  Duration of Psychotic Symptoms:Greater than six months  Hallucinations:Hallucinations: Auditory Description of Auditory Hallucinations: Hearing people talking especially kids and women screaming.  Ideas of Reference:None  Suicidal Thoughts:Suicidal Thoughts: No  Homicidal  Thoughts:Homicidal Thoughts: No  Sensorium  Memory: Immediate Fair; Recent Fair; Remote Fair  Judgment: Fair  Insight: Fair  Chartered certified accountant: Fair  Attention Span: Fair  Recall: Fair  Fund of Knowledge: Fair  Language: Good  Psychomotor Activity  Psychomotor Activity: Psychomotor Activity: Normal   Assets  Assets: Communication Skills; Physical Health; Resilience  Sleep  Sleep: Sleep: Poor Number of Hours of Sleep: 0  Physical Exam: Physical Exam Vitals and nursing note reviewed.  HENT:     Head: Normocephalic.     Right Ear: External ear normal.     Left Ear: External ear normal.     Nose: Nose normal.     Mouth/Throat:     Mouth: Mucous membranes are moist.     Pharynx: Oropharynx is clear.  Eyes:     Extraocular Movements: Extraocular movements intact.     Conjunctiva/sclera: Conjunctivae normal.     Pupils: Pupils are equal, round, and reactive to light.  Cardiovascular:     Rate and Rhythm: Tachycardia present.     Comments: Blood pressure 116/64, pulse 103 nursing staff to recheck vital signs. Pulmonary:     Effort: Pulmonary effort is normal.  Abdominal:     Palpations: Abdomen is soft.  Genitourinary:    Comments: Deferred Musculoskeletal:        General: Normal range of motion.     Cervical back: Normal range of motion and neck supple.  Skin:    General: Skin is warm.  Neurological:     General: No focal deficit present.     Mental Status: He is alert and oriented to person, place, and time.  Psychiatric:        Mood and Affect: Mood normal.        Behavior: Behavior normal.    Review of Systems  Constitutional: Negative.  Negative for chills and fever.  HENT: Negative.  Negative for hearing loss and tinnitus.   Eyes: Negative.  Negative for blurred vision and double vision.  Respiratory: Negative.  Negative for cough, sputum production, shortness of breath and wheezing.   Cardiovascular: Negative.         Pressure 116/64, pulse 103.  Nursing staff to recheck vital signs  Gastrointestinal: Negative.  Negative for abdominal pain, constipation, diarrhea, heartburn, nausea and vomiting.  Genitourinary: Negative.  Negative for dysuria, frequency and urgency.  Musculoskeletal: Negative.  Negative for myalgias and neck pain.  Skin: Negative.  Negative for itching and rash.  Neurological: Negative.  Negative for dizziness and headaches.  Endo/Heme/Allergies: Negative.  Negative for environmental allergies and polydipsia. Does not bruise/bleed easily.  Psychiatric/Behavioral:  Positive for hallucinations. The patient is nervous/anxious.    Blood pressure 116/64, pulse (!) 103, temperature 97.8 F (36.6 C), temperature source Oral, resp. rate 16, SpO2 95 %. There is no height or weight on file to calculate BMI.  Musculoskeletal: Strength & Muscle Tone: within normal limits Gait & Station: normal Patient leans: N/A  [place] Scale:  Flowsheet Row OP [facility] from [facility] Admission (Discharged) from [facility] from 11/22/2021 in [facility] EMERGENCY DEPARTMENT  C-SSRS RISK CATEGORY High Risk High Risk High Risk       Recommendations:  Based on my evaluation the patient appears to have an emergency medical  condition for which I recommend the patient be transferred to the [facility] emergency department for medical clearance  Cecilie Lowers, FNP 04/26/2022, 5:54 PM

## 2022-04-26 NOTE — ED Notes (Signed)
Pt dressed out in [employer], belongings gathered and labeled. 2 pt belonging bags, 1 large black bag placed in pt belonging area in triage.

## 2022-04-26 NOTE — ED Provider Notes (Addendum)
[facility]-EMERGENCY DEPT Provider Note   CSN: [id number] Arrival date & time: 04/26/22  1916     History  No chief complaint on file.   Jon Clayton is a 26 y.o. male.  Pt reports he is having hallucinations and is here for help.  Pt reports he has had multiple hospitalizations for psychiatric issues.  Pt reports he is not taking his medications.  Pt reports he uses meth and marijuana daily.  Pt reports some alcohol use.    The history is provided by the patient. No language interpreter was used.  Mental Health Problem Presenting symptoms: hallucinations   Timing:  Intermittent Progression:  Worsening Chronicity:  Recurrent Context: alcohol use and drug abuse   Treatment compliance:  None of the time Relieved by:  Nothing Worsened by:  Nothing Associated symptoms: irritability   Risk factors: hx of mental illness        Home Medications Prior to Admission medications   Medication Sig Start Date End Date Taking? Authorizing Provider  amitriptyline (ELAVIL) 25 MG tablet Take 1 tablet (25 mg total) by mouth at bedtime. 12/15/21 01/14/22  Massengill, Harrold Donath, MD  cloNIDine (CATAPRES) 0.1 MG tablet Take 1 tablet (0.1 mg total) by mouth every 12 (twelve) hours. 12/15/21 01/14/22  Massengill, Harrold Donath, MD  neomycin-bacitracin-polymyxin (NEOSPORIN) OINT Apply 1 Application topically 2 (two) times daily. 12/15/21   Massengill, Harrold Donath, MD  QUEtiapine (SEROQUEL XR) 200 MG 24 hr tablet Take 1 tablet (200 mg total) by mouth in the morning. 12/15/21 01/14/22  Massengill, Harrold Donath, MD  QUEtiapine (SEROQUEL XR) 400 MG 24 hr tablet Take 1 tablet (400 mg total) by mouth daily after supper. 12/15/21 01/14/22  Massengill, Harrold Donath, MD  QUEtiapine (SEROQUEL XR) 50 MG TB24 24 hr tablet Take 2 tablets (100 mg total) by mouth daily after lunch. 12/15/21 01/14/22  Massengill, Harrold Donath, MD  traZODone (DESYREL) 50 MG tablet Take 1 tablet (50 mg total) by mouth at bedtime as needed for sleep. 12/15/21 01/14/22   Massengill, Harrold Donath, MD      Allergies    Pertussis vaccines, Amoxicillin, Blue mussel [mytilus], and Penicillins    Review of Systems   Review of Systems  Constitutional:  Positive for irritability.  Psychiatric/Behavioral:  Positive for hallucinations.   All other systems reviewed and are negative.   Physical Exam Updated Vital Signs BP 122/72 (BP Location: Left Arm)   Pulse (!) 112   Temp 97.8 F (36.6 C) (Oral)   Resp 16   Ht 5\' 7"  (1.702 m)   Wt 70.3 kg   SpO2 100%   BMI 24.28 kg/m  Physical Exam Vitals and nursing note reviewed.  Constitutional:      Appearance: He is well-developed.     Comments: unkept  HENT:     Head: Normocephalic.     Mouth/Throat:     Mouth: Mucous membranes are moist.  Eyes:     Pupils: Pupils are equal, round, and reactive to light.     Comments: Injected bilat conjunctiva   Cardiovascular:     Rate and Rhythm: Normal rate and regular rhythm.  Pulmonary:     Effort: Pulmonary effort is normal.  Abdominal:     General: There is no distension.  Musculoskeletal:        General: Normal range of motion.     Cervical back: Normal range of motion.  Neurological:     Mental Status: He is alert and oriented to person, place, and time.  Psychiatric:  Comments: Anxious      ED Results / Procedures / Treatments   Labs (all labs ordered are listed, but only abnormal results are displayed) Labs Reviewed - No data to display  EKG None  Radiology No results found.  Procedures Procedures    Medications Ordered in ED Medications - No data to display  ED Course/ Medical Decision Making/ A&P                           Medical Decision Making Pt complains of hallucinations.  Pt was seen at behavioral health and sent here for medical clearance.   Amount and/or Complexity of Data Reviewed Labs: ordered. Decision-making details documented in ED Course.    Details: Labs ordered reviewed and interpreted.            Final  Clinical Impression(s) / ED Diagnoses Final diagnoses:  Hallucinations  Substance abuse [facility])    Rx / DC Orders ED Discharge Orders     None         Osie Cheeks 04/26/22 2007    Joud Ingwersen K, PA-C 04/26/22 2047    Pricilla Loveless, MD 04/26/22 [phone]

## 2022-04-26 NOTE — Progress Notes (Signed)
Pt was accepted to [facility] 04/27/22; Main Campus  Pt meets inpatient criteria per Alan Mulder, NP  Attending Physician will be Dr. Estill Cotta  Report can be called to: - [phone]* pager   Pt can arrive after 8:00am  Care Team notified: Donaciano Eva, RN  Kelton Pillar, LCSWA 04/26/2022 @ 10:13 PM

## 2022-04-26 NOTE — ED Triage Notes (Signed)
26 yo male presents c/o of sleep deprivation and hallucinations. Pt states he has been awake for 5 days straight. Pt state he is an active drug user and Is trying to get clean. Last time he did meth was yesterday but he did smoke some marijuana today. Pt denies active suicidal ideation. Pt states he came to the ED to get help in order to get clean.

## 2022-04-26 NOTE — Progress Notes (Signed)
Inpatient Behavioral Health Placement  Pt meets inpatient criteria per Alan Mulder, NP. There are no available beds at [facility] per [facility].  Referral was sent to the following facilities;   Destination  Service Provider Address Phone Fax  [facility] [address] [place] [address] [phone]  [facility]  [address], [place] [address] [phone]  [facility]  [address] [place], [place] [address] [phone]  [facility]  [address] [place] [address] [phone]  [facility]  [address] [place] [address] [phone]  [facility]  [address], [place] [address] [phone]  [facility]  [address], [place] [address] [phone]  [facility]  [address] [place] [address] [phone]  [facility]  [address], [place] [address] [phone]  [facility]  [address] [place] [address] [phone]  [facility]  [address] [place] [address] [phone]  [facility]  [address] [place], [place] [address] [phone]    Situation ongoing,  CSW will follow up.   Maryjean Ka, MSW, LCSWA 04/26/2022  @ 10:10 PM

## 2022-04-27 NOTE — ED Notes (Signed)
Jon Clayton has been called [phone]  and number left for call back

## 2022-04-27 NOTE — ED Notes (Signed)
[facility] was called again for a return call for transport of patient

## 2022-04-27 NOTE — ED Provider Notes (Signed)
Emergency Medicine Observation Re-evaluation Note  Jon Clayton is a 26 y.o. male, seen on rounds today.  Pt initially presented to the ED for complaints of Insomnia and Hallucinations Currently, the patient is asleep, resting.  Physical Exam  BP 115/71 (BP Location: Left Arm)   Pulse (!) 56   Temp 97.8 F (36.6 C) (Oral)   Resp 16   Ht 5\' 7"  (1.702 m)   Wt 70.3 kg   SpO2 98%   BMI 24.28 kg/m  Physical Exam General: NAD Cardiac: well perfused Lungs: even and unlabored Psych: no agitation  ED Course / MDM  EKG:   I have reviewed the labs performed to date as well as medications administered while in observation.  Recent changes in the last 24 hours include none.  Plan  Current plan is for Admission to [facility] today, EMTALA completed.[facility], MD 04/27/22 [phone]

## 2022-06-03 ENCOUNTER — Emergency Department (HOSPITAL_BASED_OUTPATIENT_CLINIC_OR_DEPARTMENT_OTHER)
Admission: EM | Admit: 2022-06-03 | Discharge: 2022-06-07 | Disposition: A | Discharge status: Other Acute Inpatient Hospital | Payer: Medicaid Other | Attending: Emergency Medicine | Admitting: Emergency Medicine

## 2022-06-03 ENCOUNTER — Encounter (HOSPITAL_BASED_OUTPATIENT_CLINIC_OR_DEPARTMENT_OTHER): Payer: Self-pay

## 2022-06-03 ENCOUNTER — Other Ambulatory Visit: Payer: Self-pay

## 2022-06-03 DIAGNOSIS — F1210 Cannabis abuse, uncomplicated: Secondary | ICD-10-CM | POA: Diagnosis present

## 2022-06-03 DIAGNOSIS — F1510 Other stimulant abuse, uncomplicated: Secondary | ICD-10-CM | POA: Diagnosis present

## 2022-06-03 DIAGNOSIS — Z20822 Contact with and (suspected) exposure to covid-19: Secondary | ICD-10-CM | POA: Diagnosis not present

## 2022-06-03 DIAGNOSIS — F17210 Nicotine dependence, cigarettes, uncomplicated: Secondary | ICD-10-CM | POA: Diagnosis not present

## 2022-06-03 DIAGNOSIS — F1290 Cannabis use, unspecified, uncomplicated: Secondary | ICD-10-CM | POA: Insufficient documentation

## 2022-06-03 DIAGNOSIS — F333 Major depressive disorder, recurrent, severe with psychotic symptoms: Secondary | ICD-10-CM | POA: Diagnosis not present

## 2022-06-03 DIAGNOSIS — R440 Auditory hallucinations: Secondary | ICD-10-CM | POA: Diagnosis present

## 2022-06-03 DIAGNOSIS — R45851 Suicidal ideations: Secondary | ICD-10-CM | POA: Insufficient documentation

## 2022-06-03 DIAGNOSIS — F1590 Other stimulant use, unspecified, uncomplicated: Secondary | ICD-10-CM | POA: Diagnosis not present

## 2022-06-03 DIAGNOSIS — F1520 Other stimulant dependence, uncomplicated: Secondary | ICD-10-CM | POA: Diagnosis present

## 2022-06-03 DIAGNOSIS — R443 Hallucinations, unspecified: Secondary | ICD-10-CM

## 2022-06-03 HISTORY — DX: Post-traumatic stress disorder, unspecified: F43.10

## 2022-06-03 LAB — COMPREHENSIVE METABOLIC PANEL
ALT: 13 U/L (ref 0–44)
AST: 19 U/L (ref 15–41)
Albumin: 4.4 g/dL (ref 3.5–5.0)
Alkaline Phosphatase: 57 U/L (ref 38–126)
Anion gap: 8 (ref 5–15)
BUN: 7 mg/dL (ref 6–20)
CO2: 22 mmol/L (ref 22–32)
Calcium: 9.1 mg/dL (ref 8.9–10.3)
Chloride: 107 mmol/L (ref 98–111)
Creatinine, Ser: 0.93 mg/dL (ref 0.61–1.24)
GFR, Estimated: >60 mL/min (ref >60)
Glucose, Bld: 99 mg/dL (ref 70–99)
Potassium: 3.5 mmol/L (ref 3.5–5.1)
Sodium: 137 mmol/L (ref 135–145)
Total Bilirubin: 0.9 mg/dL (ref 0.3–1.2)
Total Protein: 7.4 g/dL (ref 6.5–8.1)

## 2022-06-03 LAB — RESP PANEL BY RT-PCR (RSV, FLU A&B, COVID)  RVPGX2
Influenza A by PCR: NEGATIVE
Influenza B by PCR: NEGATIVE
Resp Syncytial Virus by PCR: NEGATIVE
SARS Coronavirus 2 by RT PCR: NEGATIVE

## 2022-06-03 LAB — RAPID URINE DRUG SCREEN, HOSP PERFORMED
Amphetamines: POSITIVE — AB
Barbiturates: NOT DETECTED
Benzodiazepines: NOT DETECTED
Cocaine: NOT DETECTED
Opiates: NOT DETECTED
Tetrahydrocannabinol: POSITIVE — AB

## 2022-06-03 LAB — CBC
HCT: 43.8 % (ref 39.0–52.0)
Hemoglobin: 14.8 g/dL (ref 13.0–17.0)
MCH: 29 pg (ref 26.0–34.0)
MCHC: 33.8 g/dL (ref 30.0–36.0)
MCV: 85.7 fL (ref 80.0–100.0)
Platelets: 249 10*3/uL (ref 150–400)
RBC: 5.11 MIL/uL (ref 4.22–5.81)
RDW: 14 % (ref 11.5–15.5)
WBC: 8.5 10*3/uL (ref 4.0–10.5)
nRBC: 0 % (ref 0.0–0.2)

## 2022-06-03 LAB — ACETAMINOPHEN LEVEL: Acetaminophen (Tylenol), Serum: <10 ug/mL — ABNORMAL LOW (ref 10–30)

## 2022-06-03 LAB — ETHANOL: Alcohol, Ethyl (B): <10 mg/dL (ref <10)

## 2022-06-03 LAB — SALICYLATE LEVEL: Salicylate Lvl: <7 mg/dL — ABNORMAL LOW (ref 7.0–30.0)

## 2022-06-03 MED ORDER — ZIPRASIDONE MESYLATE 20 MG IM SOLR
20.0000 mg | Freq: Once | INTRAMUSCULAR | Status: DC
  Filled 2022-06-03: qty 20

## 2022-06-03 MED ORDER — ZIPRASIDONE MESYLATE 20 MG IM SOLR
20.0000 mg | Freq: Once | INTRAMUSCULAR | Status: AC
  Administered 2022-06-03: 20 mg via INTRAMUSCULAR
  Filled 2022-06-03: qty 20

## 2022-06-03 MED ORDER — LORAZEPAM 1 MG PO TABS
1.0000 mg | ORAL_TABLET | Freq: Once | ORAL | Status: AC
  Administered 2022-06-03: 1 mg via ORAL
  Filled 2022-06-03: qty 1

## 2022-06-03 MED ORDER — MIDAZOLAM HCL 2 MG/2ML IJ SOLN
INTRAMUSCULAR | Status: AC
  Administered 2022-06-03: 2 mg via INTRAMUSCULAR
  Filled 2022-06-03: qty 2

## 2022-06-03 MED ORDER — RISPERIDONE 1 MG PO TBDP
2.0000 mg | ORAL_TABLET | Freq: Once | ORAL | Status: AC
  Administered 2022-06-03: 2 mg via ORAL

## 2022-06-03 MED ORDER — MIDAZOLAM HCL 5 MG/5ML IJ SOLN
2.0000 mg | Freq: Once | INTRAMUSCULAR | Status: AC
  Filled 2022-06-03: qty 2

## 2022-06-03 MED ORDER — RISPERIDONE 1 MG PO TBDP
2.0000 mg | ORAL_TABLET | Freq: Every day | ORAL | Status: DC
  Filled 2022-06-03: qty 2

## 2022-06-03 NOTE — ED Notes (Signed)
Pt was given two medications to help him relax as he was in the room hitting things and yelling out profanities and saying explicit things to the male staff. Pt did not want to lay down at the time and he was pacing the room. Security and PD were at the bedside attempting to calm pt down.
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
## 2022-06-03 NOTE — ED Notes (Signed)
No sitter available 7p-7a from staffing.
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
## 2022-06-03 NOTE — ED Notes (Signed)
Cursing loudly, [place] [employer] at bedside

## 2022-06-03 NOTE — BH Assessment (Signed)
Received order for TTS assessment. Per RN, Pt received medication for agitation and is currently unable to participate. TTS will complete assessment when Pt is alert and able to participate.   Pamalee Leyden, [facility], [facility] Triage Specialist [phone]

## 2022-06-03 NOTE — ED Notes (Addendum)
Pt's belongings a counted for and placed in cabinet above nurse's station , valuables given to security : phone , cash and cards.

## 2022-06-03 NOTE — ED Notes (Signed)
Pt is resting with eyes closed. Noted equal rise and fall of chest. Pt vitals WDL. Will continue to monitor. Safety attendant at the bedside to ensure pt safety.

## 2022-06-03 NOTE — ED Notes (Addendum)
Patient's med locked in pyxis

## 2022-06-03 NOTE — ED Notes (Signed)
Staffing called, spoke with Reggie. Informed of need of sitter

## 2022-06-03 NOTE — ED Notes (Signed)
ED Provider at bedside. 

## 2022-06-03 NOTE — ED Notes (Signed)
IVC papers faxed to magistrate per Reece Levy, Diplomatic Services operational officer

## 2022-06-04 MED ORDER — QUETIAPINE FUMARATE 300 MG PO TABS: 400.0000 mg | ORAL_TABLET | Freq: Every day | ORAL | Status: DC

## 2022-06-04 MED ORDER — TRAZODONE HCL 100 MG PO TABS
50.0000 mg | ORAL_TABLET | Freq: Every evening | ORAL | Status: DC | PRN
  Administered 2022-06-06: 50 mg via ORAL
  Filled 2022-06-04 (×2): qty 1

## 2022-06-04 MED ORDER — LORAZEPAM 2 MG/ML IJ SOLN: 2.0000 mg | Freq: Once | INTRAMUSCULAR | Status: DC

## 2022-06-04 MED ORDER — STERILE WATER FOR INJECTION IJ SOLN
INTRAMUSCULAR | Status: AC
  Filled 2022-06-04: qty 10

## 2022-06-04 MED ORDER — CLONIDINE HCL 0.1 MG PO TABS
0.1000 mg | ORAL_TABLET | Freq: Two times a day (BID) | ORAL | Status: DC
  Administered 2022-06-05: 0.1 mg via ORAL
  Filled 2022-06-04: qty 1

## 2022-06-04 MED ORDER — ZIPRASIDONE MESYLATE 20 MG IM SOLR
INTRAMUSCULAR | Status: AC
  Administered 2022-06-04: 20 mg
  Filled 2022-06-04: qty 20

## 2022-06-04 MED ORDER — ZIPRASIDONE MESYLATE 20 MG IM SOLR: 20.0000 mg | Freq: Once | INTRAMUSCULAR | Status: DC

## 2022-06-04 MED ORDER — ZIPRASIDONE MESYLATE 20 MG IM SOLR
20.0000 mg | Freq: Once | INTRAMUSCULAR | Status: AC
  Administered 2022-06-04: 20 mg via INTRAMUSCULAR
  Filled 2022-06-04: qty 20

## 2022-06-04 MED ORDER — AMITRIPTYLINE HCL 25 MG PO TABS
25.0000 mg | ORAL_TABLET | Freq: Every day | ORAL | Status: DC
  Administered 2022-06-05 – 2022-06-06 (×2): 25 mg via ORAL
  Filled 2022-06-04 (×2): qty 1

## 2022-06-04 NOTE — ED Notes (Signed)
Patient currently resting. Even rise and fall of chest noted. No distress noted at this time.

## 2022-06-04 NOTE — ED Notes (Signed)
CAmeron, CRN at [facility] ED made aware about pt transfer and immediate transport

## 2022-06-04 NOTE — Consult Note (Signed)
Patient Received Geodon injection and is sleeping at this time.  Arouses and falls back asleep.  Will try again later for Psychiatry evaluation.

## 2022-06-04 NOTE — ED Notes (Signed)
Pt in assigned room resting in bed, pleasant, no complaints. Vitals assessed. [employer]

## 2022-06-04 NOTE — ED Provider Notes (Signed)
Patient has been accepted for transfer ED ED to [facility] psych patient pending formal recommendations from psych but has been interviewed.  Patient has psychosis and has history of being combative here.  Currently required Geodon last night required Geodon again today.  Accepted by Dr. Charm Barges at [facility].   Vanetta Mulders, MD 06/04/22 1116

## 2022-06-04 NOTE — ED Notes (Signed)
Pt belongings placed in tcu locker #37 3 labeled bags.

## 2022-06-04 NOTE — Progress Notes (Signed)
Per Hillery Jacks, NP, patient meets criteria for inpatient treatment. There are no available beds at [facility] today. CSW faxed referrals to the following facilities for review:  [facility]  Pending - Request Sent N/A [address], [place] [address] [phone] --  [facility]  Pending - Request Sent N/A [address] [place] [address] [phone] --  [facility]  Pending - Request Sent N/A [address] [place] [address] [phone] --  [facility]  Pending - Request Sent N/A [address] [place] [address] [phone] --  [facility]  Pending - Request Sent N/A [address] [place] [address] [phone] --  [facility]  Pending - Request Sent N/A [address] [place], [place] [address] [phone] --  [facility]  Pending - Request Sent N/A [address] [place] [address] [phone] --  [facility]  Pending - Request Sent N/A [address] [place] [address] [phone] --  [facility]  Pending - Request Sent N/A [address] [place] [address] [phone] --  [facility]  Pending - Request Sent N/A [address] [place] [address] [phone] --  [facility]  Pending - Request Sent N/A [address] [place] [address] [phone] --  [facility]  Pending - Request Sent N/A [address] [place] [address] [phone] --  [facility]  Pending - Request Sent N/A [address] [place] [address] [phone] --  [facility]  Pending - Request Sent N/A [address], [place] [address] [phone] --  [facility]  Pending - Request Sent N/A [address], [place] [address] [phone] --  [facility]  Pending - Request Sent N/A [address] [place] [address] [phone] --   TTS will continue to seek bed placement.  Crissie Reese, MSW, Lenice Pressman Phone: [phone] Disposition/TOC

## 2022-06-04 NOTE — ED Provider Notes (Addendum)
Patient has been interviewed by TTS.  Patient getting a little bit combative again will difficult to control.  Will give Geodon.  Patient is IVC.   Vanetta Mulders, MD 06/04/22 1030    Vanetta Mulders, MD 06/04/22 1030

## 2022-06-04 NOTE — Progress Notes (Signed)
Jon Clayton with [facility] admissions advised patient is under further review.  Crissie Reese, MSW, Lenice Pressman Phone: [phone] Disposition/TOC

## 2022-06-04 NOTE — ED Notes (Signed)
Patient currently resting. Even rise and fall of chest noted. One bag of belongings with security (cash, phone, credit cards, lighters, papers, and knife) per patient request. Three bags of patient belongings placed in cabinets for rooms 9-12.

## 2022-06-04 NOTE — ED Provider Notes (Signed)
Patient highly agitated with aggressive behavior.  Will give Geodon and Ativan.  Placed in soft restraints.   Virgina Norfolk, DO 06/04/22 1939

## 2022-06-04 NOTE — ED Notes (Signed)
Pt noted to have left the department during shift change. Per oncoming RN, pt was visualized walking out of ED to main hallway near registration office. Security notified and were able to locate pt and walk him back to hallway B. EDP notified of need for medication and/or restraints due to pt leaving the department 3 times so far in this visit.
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
## 2022-06-04 NOTE — ED Notes (Signed)
TTS in progress 

## 2022-06-05 DIAGNOSIS — F333 Major depressive disorder, recurrent, severe with psychotic symptoms: Secondary | ICD-10-CM

## 2022-06-05 DIAGNOSIS — F1510 Other stimulant abuse, uncomplicated: Secondary | ICD-10-CM | POA: Diagnosis present

## 2022-06-05 DIAGNOSIS — F1520 Other stimulant dependence, uncomplicated: Secondary | ICD-10-CM | POA: Diagnosis present

## 2022-06-05 MED ORDER — QUETIAPINE FUMARATE 100 MG PO TABS
200.0000 mg | ORAL_TABLET | Freq: Every day | ORAL | Status: DC
  Administered 2022-06-05: 200 mg via ORAL
  Filled 2022-06-05: qty 2

## 2022-06-05 MED ORDER — DIVALPROEX SODIUM ER 500 MG PO TB24
500.0000 mg | ORAL_TABLET | Freq: Every day | ORAL | Status: DC
  Administered 2022-06-05 – 2022-06-07 (×3): 500 mg via ORAL
  Filled 2022-06-05 (×3): qty 1

## 2022-06-05 MED ORDER — IBUPROFEN 200 MG PO TABS
600.0000 mg | ORAL_TABLET | Freq: Four times a day (QID) | ORAL | Status: DC | PRN
  Administered 2022-06-05: 600 mg via ORAL
  Filled 2022-06-05: qty 3

## 2022-06-05 MED ORDER — QUETIAPINE FUMARATE 100 MG PO TABS: 100.0000 mg | ORAL_TABLET | Freq: Every day | ORAL | Status: DC

## 2022-06-05 MED ORDER — QUETIAPINE FUMARATE 100 MG PO TABS: 200.0000 mg | ORAL_TABLET | Freq: Every day | ORAL | Status: DC

## 2022-06-05 NOTE — ED Notes (Signed)
FOOD ORDER FOR 06-06-22  Breakfast  Omelet with spinach, tomatoes, Swiss and ham strawberry Greek yogurt banana x 1 orange juice x 2  Lunch meatloaf with gravy collard greens baked sweet potato with brown sugar and butter ginger ale x 2 chocolate chip cookie x 2  Dinner Christmas special

## 2022-06-05 NOTE — Progress Notes (Signed)
CSW followed-up [facility]. It was reported that there are currently no appropriate beds available.  Tiphanie Vo, MSW, LCSW-A, LCAS Phone: [phone] Disposition/TOC  

## 2022-06-05 NOTE — ED Notes (Signed)
Patient calm and resting all night.

## 2022-06-05 NOTE — Consult Note (Signed)
BH ED ASSESSMENT   Reason for Consult:  Psychiatry evaluation Referring Physician:  ER Physician Patient Identification: Jon RipaJamie Clayton MRN:  [id number] ED Chief Complaint: MDD (major depressive disorder), recurrent, severe, with psychosis (HCC)  Diagnosis:  Principal Problem:   MDD (major depressive disorder), recurrent, severe, with psychosis (HCC) Active Problems:   Cannabis use disorder   Amphetamine-type substance use disorder, severe (HCC)   ED Assessment Time Calculation: Start Time: 1517 Stop Time: 1540 Total Time in Minutes (Assessment Completion): 23   Subjective:   Jon Clayton is a 26 y.o. male patient admitted with hx significant for MDD, PTSD.and substance abuse [facility] for endorsing Depression and suicide ideation with plan to walk into traffic. Patient reports hearing voices saying different things to him.  HPI:  Efforts to evaluate patient since yesterday failed due to being medicated for agitation, aggression and difficult to redirect.  He was on restraint last night after receiving Geodon as well.  This morning he slept until this afternoon and participated in the evaluation.  He reports sadness, hopelessness, irritability, worthlessness, restlessness and aggressiveness and people are out to get him.  Patient's urine is positive for Cannabis and Amphetamine.  He does not remember when he took his medications last.  [facility] and [facility] [place].  Patient states he is now receiving [facility]  [facility] [place].  Patient remains restless, pacing in his room.  His last Medications are resumed.  We will continue to monitor while seeking for inpatient Psychiatry Hospitalization.  We will fax out records to facilities with available beds.  Patient denies SI/HI/VH.  Past Psychiatric History: MDD with Psychotic Features, PTSD, Polysubstance use  and dissociative disorder  Risk to Self or  Others: Is the patient at risk to self? No Has the patient been a risk to self in the past 6 months? No Has the patient been a risk to self within the distant past? No Is the patient a risk to others? No Has the patient been a risk to others in the past 6 months? No Has the patient been a risk to others within the distant past? No  [place] Scale:  Flowsheet Row ED [facility] Most recent reading at 06/04/2022  9:43 AM ED [facility] Most recent reading at 04/26/2022  8:28 PM [facility] Most recent reading at 04/26/2022  5:46 PM  C-SSRS RISK CATEGORY High Risk High Risk High Risk       AIMS:  , , ,  ,   ASAM: ASAM Multidimensional Assessment Summary Dimension 1:  Description of individual's past and current experiences of substance use and withdrawal: Pt reports two yers of sobriety, Relapsed. DImension 1:  Acute Intoxication and/or Withdrawal Potential Severity Rating: Mild Dimension 2:  Description of patient's biomedical conditions and  complications: Pt did not reports biomedical conditons Dimension 2:  Biomedical Conditions and Complications Severity Rating: None Dimension 3:  Description of emotional, behavioral, or cognitive conditions and complications: MMD, PTDS Dimension 3:  Emotional, behavioral or cognitive (EBC) conditions and complications severity rating: Severe Dimension 4:  Description of Readiness to Change criteria: contemplation Dimension 4:  Readiness to Change Severity Rating: Severe Dimension 5:  Relapse, continued use, or continued problem potential critiera description: Relapse Dimension 5:  Relapse, continued use, or continued  problem potential severity rating: Severe Dimension 6:  Recovery/Iiving environment criteria description: Homeless Dimension 6:  Recovery/living environment severity  rating: Very Severe ASAM's Severity Rating Score: 13 ASAM Recommended Level of Treatment: Level I Outpatient Treatment (N/A)  Substance Abuse:  Alcohol / Drug Use Pain Medications: See MAR Prescriptions: See MAR Over the Counter: See MAR History of alcohol / drug use?: Yes Longest period of sobriety (when/how long): Pt reports two year sobriety, unable to specify timeline Negative Consequences of Use: Personal relationships, Financial (Pt denies) Withdrawal Symptoms: Agitation (Pt denies)  Past Medical History:  Past Medical History:  Diagnosis Date   Depression    Psychosis (HCC)    PTSD (post-traumatic stress disorder)     Past Surgical History:  Procedure Laterality Date   NASAL SINUS SURGERY Bilateral    TONSILLECTOMY     Family History: History reviewed. No pertinent family history. Family Psychiatric  History: Multiple family members suffers from Bipolar disorder and Schizophrenia, Maternal uncle committed suicide. Social History:  Social History   Substance and Sexual Activity  Alcohol Use Not Currently   Comment: reports periodic alcohol use     Social History   Substance and Sexual Activity  Drug Use Yes   Frequency: 1.0 times per week   Types: Marijuana   Comment: 1/3 gm daily    Social History   Socioeconomic History   Marital status: Single    Spouse name: Not on file   Number of children: Not on file   Years of education: Not on file   Highest education level: 8th grade  Occupational History   Not on file  Tobacco Use   Smoking status: Every Day    Packs/day: 1.00    Years: 11.00    Total pack years: 11.00    Types: Cigarettes   Smokeless tobacco: Never  Vaping Use   Vaping Use: Never used  Substance and Sexual Activity   Alcohol use: Not Currently    Comment: reports periodic alcohol use   Drug use: Yes    Frequency: 1.0 times per week    Types: Marijuana    Comment: 1/3 gm daily   Sexual activity: Not Currently  Other Topics Concern    Not on file  Social History Narrative   Not on file   Social Determinants of Health   Financial Resource Strain: Not on file  Food Insecurity: Not on file  Transportation Needs: Not on file  Physical Activity: Not on file  Stress: Not on file  Social Connections: Not on file   Additional Social History:    Allergies:   Allergies  Allergen Reactions   Amoxicillin Anaphylaxis and Rash   Penicillins Anaphylaxis and Rash   Pertussis Vaccines Hives and Other (See Comments)    Ped. MD said he was allergic to a medication in the vaccine.   Blue Mussel [Mytilus] Swelling and Rash    Itchy throat    Labs:  No results found for this or any previous visit (from the past 48 hour(s)).   Current Facility-Administered Medications  Medication Dose Route Frequency Provider Last Rate Last Admin   amitriptyline (ELAVIL) tablet 25 mg  25 mg Oral QHS Maurene Hollin C, NP       divalproex (DEPAKOTE ER) 24 hr tablet 500 mg  500 mg Oral Daily Deshayla Empson C, NP       LORazepam (ATIVAN) injection 2 mg  2 mg Intramuscular Once Curatolo, Adam, DO       [START ON  06/06/2022] QUEtiapine (SEROQUEL) tablet 100 mg  100 mg Oral Q1200 Maksim Peregoy C, NP       QUEtiapine (SEROQUEL) tablet 200 mg  200 mg Oral QHS Jessilynn Taft C, NP       traZODone (DESYREL) tablet 50 mg  50 mg Oral QHS PRN Dahlia Byes C, NP       Current Outpatient Medications  Medication Sig Dispense Refill   Divalproex Sodium (DEPAKOTE PO) Take by mouth.     QUEtiapine (SEROQUEL) 100 MG tablet Take 100 mg by mouth daily.     SERTRALINE HCL PO Take 1 tablet by mouth daily.     amitriptyline (ELAVIL) 25 MG tablet Take 1 tablet (25 mg total) by mouth at bedtime. (Patient not taking: Reported on 06/05/2022) 30 tablet 0   cloNIDine (CATAPRES) 0.1 MG tablet Take 1 tablet (0.1 mg total) by mouth every 12 (twelve) hours. (Patient not taking: Reported on 06/05/2022) 60 tablet 0   QUEtiapine (SEROQUEL XR) 200 MG 24 hr  tablet Take 1 tablet (200 mg total) by mouth in the morning. (Patient not taking: Reported on 06/05/2022) 30 tablet 0   QUEtiapine (SEROQUEL XR) 400 MG 24 hr tablet Take 1 tablet (400 mg total) by mouth daily after supper. (Patient not taking: Reported on 06/05/2022) 30 tablet 0   QUEtiapine (SEROQUEL XR) 50 MG TB24 24 hr tablet Take 2 tablets (100 mg total) by mouth daily after lunch. (Patient not taking: Reported on 06/05/2022) 60 tablet 0   traZODone (DESYREL) 50 MG tablet Take 1 tablet (50 mg total) by mouth at bedtime as needed for sleep. (Patient not taking: Reported on 06/05/2022) 30 tablet 0    Musculoskeletal: Strength & Muscle Tone: within normal limits Gait & Station: normal Patient leans: Front   Psychiatric Specialty Exam: Presentation  General Appearance:  Disheveled; Casual  Eye Contact: Fleeting  Speech: Clear and Coherent; Normal Rate  Speech Volume: Decreased  Handedness: Right   Mood and Affect  Mood: Labile; Depressed; Anxious  Affect: Congruent; Labile; Depressed   Thought Process  Thought Processes: Coherent; Linear  Descriptions of Associations:Intact  Orientation:Full (Time, Place and Person)  Thought Content:Logical  History of Schizophrenia/Schizoaffective disorder:No  Duration of Psychotic Symptoms:Greater than six months  Hallucinations:Hallucinations: Auditory Description of Auditory Hallucinations: Many voices saying different things.  Ideas of Reference:None  Suicidal Thoughts:Suicidal Thoughts: No  Homicidal Thoughts:Homicidal Thoughts: No   Sensorium  Memory: Immediate Fair; Recent Fair; Remote Fair  Judgment: Impaired  Insight: Shallow   Executive Functions  Concentration: Poor  Attention Span: Poor  Recall: Fair  Fund of Knowledge: Fair  Language: Good   Psychomotor Activity  Psychomotor Activity: Psychomotor Activity: Increased   Assets  Assets: Communication Skills; Desire for  Improvement    Sleep  Sleep: Sleep: Fair   Physical Exam: Physical Exam Vitals and nursing note reviewed.  Constitutional:      Appearance: Normal appearance.  HENT:     Head: Normocephalic and atraumatic.     Nose: Nose normal.  Cardiovascular:     Rate and Rhythm: Normal rate and regular rhythm.  Pulmonary:     Effort: Pulmonary effort is normal.  Musculoskeletal:        General: Normal range of motion.     Cervical back: Normal range of motion.  Skin:    General: Skin is warm and dry.  Neurological:     Mental Status: He is alert and oriented to person, place, and time.    Review of Systems  Constitutional: Negative.   HENT: Negative.    Eyes: Negative.   Respiratory: Negative.    Cardiovascular: Negative.   Gastrointestinal: Negative.   Genitourinary: Negative.   Musculoskeletal: Negative.   Skin: Negative.   Neurological: Negative.   Endo/Heme/Allergies: Negative.   Psychiatric/Behavioral:  Positive for depression, hallucinations, substance abuse and suicidal ideas.    Blood pressure 130/86, pulse 75, temperature 98.6 F (37 C), temperature source Oral, resp. rate 18, height 5\' 7"  (1.702 m), weight 68 kg, SpO2 100 %. Body mass index is 23.49 kg/m.  Medical Decision Making: Patient continues to require inpatient Psychiatric hospitalization.  We have resumed home Medications and will continue to make changes as needed.  He denies SI/HI/VH.  He paces in his room but have not shown any aggressive behavior today.  Problem 1: Recurrent Major Depressive disorder, severe with Psychotic features  Problem 2: Cannabis use disorder  Problem 3: Amphetamine-type substance use disorder, severe.  Disposition:  Admit, seek bed placement.  , NP 06/05/2022 4:14 PM

## 2022-06-06 ENCOUNTER — Emergency Department (HOSPITAL_COMMUNITY): Payer: Medicaid Other

## 2022-06-06 MED ORDER — QUETIAPINE FUMARATE 300 MG PO TABS: 300.0000 mg | ORAL_TABLET | Freq: Every day | ORAL | Status: DC

## 2022-06-06 MED ORDER — QUETIAPINE FUMARATE 100 MG PO TABS
200.0000 mg | ORAL_TABLET | Freq: Every day | ORAL | Status: DC
  Administered 2022-06-06: 200 mg via ORAL
  Filled 2022-06-06: qty 2

## 2022-06-06 MED ORDER — QUETIAPINE FUMARATE 100 MG PO TABS: 200.0000 mg | ORAL_TABLET | Freq: Every day | ORAL | Status: DC

## 2022-06-06 MED ORDER — QUETIAPINE FUMARATE 50 MG PO TABS
150.0000 mg | ORAL_TABLET | Freq: Every day | ORAL | Status: DC
  Administered 2022-06-07: 150 mg via ORAL
  Filled 2022-06-06: qty 1

## 2022-06-06 NOTE — ED Notes (Signed)
Lying in bed watching tv behavior appropriate at this time affect remains flat mood labile but  self controlled.

## 2022-06-06 NOTE — ED Notes (Signed)
Portable xray right hand completed

## 2022-06-06 NOTE — Progress Notes (Signed)
LCSW Progress Note  [id number]   [name]  06/06/2022  9:58 AM  Description:   Inpatient Psychiatric Referral  Patient was recommended inpatient per Dahlia Byes, NP. There are no available beds at [facility]. Patient was referred to the following facilities:   Destination  Service Provider Address Phone Fax  [facility]  [address], [place] [address] [phone]  [facility] [address] [place]  [address], [place] [address] [phone]  [facility]  [address] [place] [address] [phone]  [facility]  [address] [place] [address] [phone]  [facility]  [address] [place], [place] [address] [phone]  [facility]  [address] [place] [address] [phone]  [facility]  [address] [place] [address] [phone]  [facility]  [address], [place] [address] [phone]  [facility]  [address] [place] [address] [phone]  [facility]  [address], [place] [address] [phone]  [facility]  [address] [place] [address] [phone]  [facility]  [address], [place] [address] [phone]  [facility]  [address] [place] [address] [phone]  [facility]  [address] [place], [place] [address] [phone]  [facility]  [address] [place] [address] [phone]  [facility]  [address], [place] [address] [phone]  [facility]  [address] [place], [place] [address] [phone]  [facility]  [address] [place], [place] [address] [phone]  [facility]  [address] [place] [address] [phone]  [facility]  [address] [place] [address] [phone]  [facility]  [address] [place] [address] [phone]      Situation ongoing, CSW to continue following and update chart as more information becomes available.      Jon Clayton, Theresia Majors  06/06/2022 9:58 AM

## 2022-06-06 NOTE — ED Notes (Signed)
Verbal order obtained that is ok to give missed dose depakote that was schedule for the 10:00 am now per [facility] NP.  Feelings of frustration process with Joedy currently denies AVH verbally appropriate conversation coherent and linear. Affect is very flat mood is less labile than yesterday.

## 2022-06-06 NOTE — ED Notes (Signed)
Sleeping with no indications of distress or sleep disturbance, respirations are easy and skin color  WNL.

## 2022-06-06 NOTE — Consult Note (Signed)
Patient is accepted at [facility] for inpatient Psychiatric treatment.  Patient will be transported tomorrow.  Meanwhile has been calm in his room all day.  He denies seeing his deceased  friend so far today.  He denies SI/HI/AVH.

## 2022-06-06 NOTE — Progress Notes (Signed)
Pt was accepted to [facility] TOMORROW 06/07/2022; Bed assignment: main campus  Pt meets inpatient criteria per Dahlia Byes, NP  Attending Physician will be Estill Cotta, MD  Report can be called to: [phone] (this is a pager, please leave call-back number when giving report)  Pt can arrive tomorrow 06/07/2022 after 8 AM  Care Team Notified: Dahlia Byes, NP, Carleene Overlie, EMT, and Alfonzo Feller, RN  [place], [place]  06/06/2022 10:57 AM

## 2022-06-06 NOTE — ED Provider Notes (Signed)
Emergency Medicine Observation Re-evaluation Note  Alasdair Kleve is a 26 y.o. male, seen on rounds today.  Pt initially presented to the ED for complaints of Suicidal Currently, the patient is here for depression, suicidal ideation, with agitation and psychosis. Under IVC.  Physical Exam  BP 128/80 (BP Location: Left Arm)   Pulse 93   Temp 98.6 F (37 C) (Oral)   Resp 16   Ht 5\' 7"  (1.702 m)   Wt 68 kg   SpO2 99%   BMI 23.49 kg/m  Physical Exam General: NAD Cardiac: RR Lungs: even unlabored Psych: NA  ED Course / MDM  EKG:   I have reviewed the labs performed to date as well as medications administered while in observation.  Recent changes in the last 24 hours include none.  Plan  Current plan is for inpt treatment.    , MD 06/06/22 2256
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
## 2022-06-07 NOTE — ED Notes (Signed)
Pt did not receive a breakfast tray but was given a sausage biscuit before transport came

## 2022-06-07 NOTE — ED Notes (Signed)
SD called back and states they will call us 30 min prior to pickup;

## 2022-06-07 NOTE — ED Notes (Signed)
Called [facility] and left message with call back number to give pt report.  Transportation called by [facility] prior to him leaving this morning.  Pt currently asleep and had no issues overnight.

## 2022-06-07 NOTE — ED Notes (Signed)
[facility] called for transport a second time and message left.  No one answering the transport phone.  2 messages left.

## 2022-06-07 NOTE — ED Notes (Signed)
[facility] called advised they will be arriving in 30 min to transport pt

## 2022-06-07 NOTE — ED Notes (Signed)
Pt was given morning meds

## 2022-06-07 NOTE — ED Notes (Signed)
Currently appears to be asleep pt has blanket over face to block light. No signs of distress or broken sleep indicated, skin color remains WNL respirations appear easy.

## 2022-06-07 NOTE — ED Provider Notes (Signed)
Plan is for patient to be transferred to [facility] this morning.  Patient is currently resting in no acute distress.  Vital signs are stable.  Medically cleared for transport for further psychiatric inpatient treatment   Linwood Dibbles, MD 06/07/22 [phone]

## 2022-06-07 NOTE — ED Notes (Signed)
Pt report given to Raquel James, Charity fundraiser at [facility] via phone.

## 2022-07-04 ENCOUNTER — Other Ambulatory Visit: Payer: Self-pay

## 2022-07-04 ENCOUNTER — Ambulatory Visit (HOSPITAL_COMMUNITY)
Admission: EM | Admit: 2022-07-04 | Discharge: 2022-07-07 | Disposition: A | Discharge status: Home / Self Care | Payer: Medicaid Other | Attending: Nurse Practitioner | Admitting: Nurse Practitioner

## 2022-07-04 DIAGNOSIS — Z1152 Encounter for screening for COVID-19: Secondary | ICD-10-CM | POA: Diagnosis not present

## 2022-07-04 DIAGNOSIS — Z72811 Adult antisocial behavior: Secondary | ICD-10-CM | POA: Diagnosis present

## 2022-07-04 DIAGNOSIS — F1510 Other stimulant abuse, uncomplicated: Secondary | ICD-10-CM | POA: Diagnosis present

## 2022-07-04 DIAGNOSIS — F1290 Cannabis use, unspecified, uncomplicated: Secondary | ICD-10-CM

## 2022-07-04 DIAGNOSIS — R45851 Suicidal ideations: Secondary | ICD-10-CM | POA: Diagnosis not present

## 2022-07-04 DIAGNOSIS — R45850 Homicidal ideations: Secondary | ICD-10-CM | POA: Insufficient documentation

## 2022-07-04 DIAGNOSIS — F4310 Post-traumatic stress disorder, unspecified: Secondary | ICD-10-CM | POA: Diagnosis present

## 2022-07-04 DIAGNOSIS — R440 Auditory hallucinations: Secondary | ICD-10-CM | POA: Insufficient documentation

## 2022-07-04 DIAGNOSIS — R442 Other hallucinations: Secondary | ICD-10-CM | POA: Diagnosis not present

## 2022-07-04 DIAGNOSIS — F1210 Cannabis abuse, uncomplicated: Secondary | ICD-10-CM | POA: Diagnosis present

## 2022-07-04 DIAGNOSIS — F609 Personality disorder, unspecified: Secondary | ICD-10-CM | POA: Diagnosis present

## 2022-07-04 DIAGNOSIS — F333 Major depressive disorder, recurrent, severe with psychotic symptoms: Secondary | ICD-10-CM | POA: Diagnosis present

## 2022-07-04 DIAGNOSIS — F1191 Opioid use, unspecified, in remission: Secondary | ICD-10-CM

## 2022-07-04 DIAGNOSIS — F1990 Other psychoactive substance use, unspecified, uncomplicated: Secondary | ICD-10-CM | POA: Diagnosis present

## 2022-07-04 DIAGNOSIS — F1520 Other stimulant dependence, uncomplicated: Secondary | ICD-10-CM

## 2022-07-04 LAB — CBC WITH DIFFERENTIAL/PLATELET
Abs Immature Granulocytes: 0.11 10*3/uL — ABNORMAL HIGH (ref 0.00–0.07)
Basophils Absolute: 0.1 10*3/uL (ref 0.0–0.1)
Basophils Relative: 1 %
Eosinophils Absolute: 0.2 10*3/uL (ref 0.0–0.5)
Eosinophils Relative: 2 %
HCT: 41 % (ref 39.0–52.0)
Hemoglobin: 13.8 g/dL (ref 13.0–17.0)
Immature Granulocytes: 1 %
Lymphocytes Relative: 31 %
Lymphs Abs: 3.2 10*3/uL (ref 0.7–4.0)
MCH: 29.6 pg (ref 26.0–34.0)
MCHC: 33.7 g/dL (ref 30.0–36.0)
MCV: 88 fL (ref 80.0–100.0)
Monocytes Absolute: 0.8 10*3/uL (ref 0.1–1.0)
Monocytes Relative: 8 %
Neutro Abs: 5.9 10*3/uL (ref 1.7–7.7)
Neutrophils Relative %: 57 %
Platelets: 259 10*3/uL (ref 150–400)
RBC: 4.66 MIL/uL (ref 4.22–5.81)
RDW: 14 % (ref 11.5–15.5)
WBC: 10.2 10*3/uL (ref 4.0–10.5)
nRBC: 0 % (ref 0.0–0.2)

## 2022-07-04 LAB — POCT URINE DRUG SCREEN - MANUAL ENTRY (I-SCREEN)
POC Amphetamine UR: NOT DETECTED
POC Buprenorphine (BUP): NOT DETECTED
POC Cocaine UR: NOT DETECTED
POC Marijuana UR: NOT DETECTED
POC Methadone UR: NOT DETECTED
POC Methamphetamine UR: NOT DETECTED
POC Morphine: NOT DETECTED
POC Oxazepam (BZO): NOT DETECTED
POC Oxycodone UR: NOT DETECTED
POC Secobarbital (BAR): NOT DETECTED

## 2022-07-04 LAB — POC SARS CORONAVIRUS 2 AG: SARSCOV2ONAVIRUS 2 AG: NEGATIVE

## 2022-07-04 MED ORDER — OLANZAPINE 7.5 MG PO TABS
7.5000 mg | ORAL_TABLET | Freq: Every day | ORAL | Status: DC
  Administered 2022-07-04 – 2022-07-06 (×3): 7.5 mg via ORAL
  Filled 2022-07-04 (×3): qty 1

## 2022-07-04 MED ORDER — TRAZODONE HCL 50 MG PO TABS
50.0000 mg | ORAL_TABLET | Freq: Every evening | ORAL | Status: DC | PRN
  Administered 2022-07-04 – 2022-07-06 (×3): 50 mg via ORAL
  Filled 2022-07-04 (×3): qty 1

## 2022-07-04 MED ORDER — HYDROXYZINE HCL 25 MG PO TABS
25.0000 mg | ORAL_TABLET | Freq: Three times a day (TID) | ORAL | Status: DC | PRN
  Administered 2022-07-04 – 2022-07-06 (×3): 25 mg via ORAL
  Filled 2022-07-04 (×3): qty 1

## 2022-07-04 MED ORDER — ALUM & MAG HYDROXIDE-SIMETH 200-200-20 MG/5ML PO SUSP: 30.0000 mL | ORAL | Status: DC | PRN

## 2022-07-04 MED ORDER — LORAZEPAM 1 MG PO TABS: 1.0000 mg | ORAL_TABLET | ORAL | Status: DC | PRN

## 2022-07-04 MED ORDER — MAGNESIUM HYDROXIDE 400 MG/5ML PO SUSP: 30.0000 mL | Freq: Every day | ORAL | Status: DC | PRN

## 2022-07-04 MED ORDER — ZIPRASIDONE MESYLATE 20 MG IM SOLR: 20.0000 mg | Freq: Two times a day (BID) | INTRAMUSCULAR | Status: DC | PRN

## 2022-07-04 MED ORDER — ACETAMINOPHEN 325 MG PO TABS
650.0000 mg | ORAL_TABLET | Freq: Four times a day (QID) | ORAL | Status: DC | PRN
  Administered 2022-07-04 – 2022-07-07 (×2): 650 mg via ORAL
  Filled 2022-07-04 (×2): qty 2
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
## 2022-07-04 NOTE — ED Provider Notes (Signed)
[facility] Continuous Assessment Admission H&P  Date: 07/04/22 Patient Name: Jon Clayton MRN: [id number] Chief Complaint:" I'm hallucinating".  Diagnoses:  Final diagnoses:  Severe auditory hallucinations  Suicidal ideations  Homicidal ideations  Tactile hallucinations    HPI: Jon Clayton is a 27 year old male with psychiatric history significant for MDD, PTSD, polysubstance abuse, personality disorder, antisocial behavior, at high risk of self-harm who presented voluntarily to [facility] accompanied by [facility] staff  Ms Loleta Clayton [phone] with complaints of ongoing auditory hallucinations all weekend, SI, HI, violent thoughts, and paranoia.  Patient was seen face-to-face by this provider and chart reviewed. Patient is familiar to [facility] and have several ED visits for mental health illness as well as homelessness. He was admitted, treated, and discharged from [facility] in May 2023, and in June 2023. Then at [facility] in [place] in December. Patient states he was just medications at [facility] in [facility], and at currently at [facility] where he came from tonight.   On evaluation, patient is alert, oriented x 2, and cooperative. Speech is blocked. Pt appears disheveled. Eye contact is fair. Mood is anxious, affect is congruent with mood. Thought process is linear and circumstantial and thought content is wdl. Pt endorses active SI, passive HI,AVH, and paranoia There is no objective indication that the patient is responding to internal stimuli. There are delusions elicited during this assessment, as patient reported he believes people in the next room are always talking about him.  Patient reports "Ms Loleta Clayton brought me in, she's a IT trainer, I'm mainly hearing voices, I also, like feel hands touching me, it's like, I feel like, people are out to get me, it's like, for example, conversations out there, I may not know what they say, but my brain makes it like they talking about me, to kill me, and  I'm also having some SI, but I've had it most of my life, I'm having a few different plans, about 9 months ago, picked up by police standing in the middle of the highway, trying to get hit by a car, or poisoning myself with apple seeds, which is most recent, I had a whole bag saved up before I left, just threw them away".  Patient reports "sometimes I feel like I'm going to hurt someone, right now no, right before I came here yes.  Sometimes I have visual hallucinations, not as often as I hear things, but sometimes".  Patient reports "I was going to [place] [facility] to find housing, healing, and hope, basically to recover from drug addiction, I've been 33 days sober, they said that because of the problems I'm dealing with, I need to come here and when I get better, I can come back there".  Patient reports he takes regular psychiatric medications, but is unable to recall the names of these medications.  Patient reports his sleep and appetite as poor.  Patient denies access to a gun or weapon.  Patient reports he is currently homeless, and is originally from [place], [place].  Patient reports it will be 2 years in July since he left home.  Support, encouragement, and reassurance provided about ongoing stressors.  Patient provided with opportunity for questions.  Collateral information was obtained from [facility] staff, Ms Loleta Clayton, who brought the patient directly from the center.  She reports that she worked with the patient on the weekend and he was having auditory hallucinations, having thoughts of harming himself and others, having violent thoughts, and needing higher level of care.  She reports the patient sleeps through the night with his meds, but during the day, not so much.  She reports that the patient was admitted at [facility] from [facility]. Ms Loleta Clayton provided a recent list of the patient's medications from [facility] and when he was admitted at [facility] in December.  Per Mar from  [facility], the patient is currently taking hydroxyzine 25 mg p.o. every 6 hours as needed for anxiety, lithium carbonate 300 mg p.o. 3 times daily for mood and suicidal thoughts, Divalproex DR  500 mg 1 tab twice daily for bipolar, olanzapine 5 mg p.o. daily for bipolar and olanzapine 15 mg p.o. nightly for bipolar.  Total Time spent with patient: 20 minutes  Musculoskeletal  Strength & Muscle Tone: within normal limits Gait & Station: normal Patient leans: N/A  Psychiatric Specialty Exam  Presentation General Appearance:  Disheveled  Eye Contact: Fair  Speech: Blocked  Speech Volume: Normal  Handedness: Right   Mood and Affect  Mood: Anxious  Affect: Congruent   Thought Process  Thought Processes: Linear  Descriptions of Associations:Circumstantial  Orientation:Partial  Thought Content:WDL  Diagnosis of Schizophrenia or Schizoaffective disorder in past: No  Duration of Psychotic Symptoms: Greater than six months  Hallucinations:Hallucinations: Auditory; Visual; Tactile Description of Auditory Hallucinations: Pt reports hearing voices directed at him Description of Visual Hallucinations: Pt reports seeing things sometimes  Ideas of Reference:None  Suicidal Thoughts:Suicidal Thoughts: Yes, Active SI Active Intent and/or Plan: With Plan  Homicidal Thoughts:Homicidal Thoughts: Yes, Passive HI Passive Intent and/or Plan: Without Plan   Sensorium  Memory: Immediate Fair  Judgment: Poor  Insight: Shallow   Executive Functions  Concentration: Fair  Attention Span: Fair  Recall: [employer] of Knowledge: Fair  Language: Fair   Psychomotor Activity  Psychomotor Activity: Psychomotor Activity: Normal   Assets  Assets: Communication Skills; Desire for Improvement; Resilience   Sleep  Sleep: Sleep: Poor   Nutritional Assessment (For OBS and FBC admissions only) Has the patient had a weight loss or gain of 10 pounds or more in  the last 3 months?: No Has the patient had a decrease in food intake/or appetite?: No Does the patient have dental problems?: No Does the patient have eating habits or behaviors that may be indicators of an eating disorder including binging or inducing vomiting?: No Has the patient been eating poorly because of a decreased appetite?: 0    Physical Exam Constitutional:      General: He is not in acute distress.    Appearance: He is not diaphoretic.  HENT:     Head: Normocephalic.     Right Ear: External ear normal.     Left Ear: External ear normal.     Nose: No congestion.  Eyes:     General:        Right eye: No discharge.        Left eye: No discharge.  Cardiovascular:     Rate and Rhythm: Normal rate.  Pulmonary:     Effort: No respiratory distress.  Chest:     Chest wall: No tenderness.  Neurological:     Mental Status: He is alert. Mental status is at baseline.  Psychiatric:        Attention and Perception: He perceives auditory hallucinations.        Mood and Affect: Mood is anxious. Affect is blunt.        Speech: Speech normal.        Behavior: Behavior is  cooperative.        Thought Content: Thought content is paranoid and delusional. Thought content includes homicidal and suicidal ideation. Thought content includes suicidal plan. Thought content does not include homicidal plan.        Judgment: Judgment is inappropriate.    Review of Systems  Constitutional:  Negative for chills, diaphoresis and fever.  HENT:  Negative for congestion.   Eyes:  Negative for discharge.  Respiratory:  Negative for cough, shortness of breath and wheezing.   Cardiovascular:  Negative for chest pain and palpitations.  Gastrointestinal:  Negative for diarrhea, nausea and vomiting.  Neurological:  Negative for tingling, seizures, loss of consciousness and headaches.  Psychiatric/Behavioral:  Positive for hallucinations and suicidal ideas. Negative for depression and substance abuse.  The patient is nervous/anxious.     Blood pressure 130/80, pulse 92, temperature 98.1 F (36.7 C), temperature source Oral, resp. rate 18, SpO2 100 %. There is no height or weight on file to calculate BMI.  Past Psychiatric History:  See H & P   Is the patient at risk to self? Yes  Has the patient been a risk to self in the past 6 months? Yes .    Has the patient been a risk to self within the distant past? Yes   Is the patient a risk to others? Yes   Has the patient been a risk to others in the past 6 months? No   Has the patient been a risk to others within the distant past? Yes   Past Medical History:  See H & P  Family History: N/A  Social History: N/A  Last Labs:  Admission on 07/04/2022  Component Date Value Ref Range Status   WBC 07/04/2022 10.2  4.0 - 10.5 K/uL Final   RBC 07/04/2022 4.66  4.22 - 5.81 MIL/uL Final   Hemoglobin 07/04/2022 13.8  13.0 - 17.0 g/dL Final   HCT 28/41/324401/22/2024 41.0  39.0 - 52.0 % Final   MCV 07/04/2022 88.0  80.0 - 100.0 fL Final   MCH 07/04/2022 29.6  26.0 - 34.0 pg Final   MCHC 07/04/2022 33.7  30.0 - 36.0 g/dL Final   RDW 01/02/725301/22/2024 14.0  11.5 - 15.5 % Final   Platelets 07/04/2022 259  150 - 400 K/uL Final   nRBC 07/04/2022 0.0  0.0 - 0.2 % Final   Neutrophils Relative % 07/04/2022 57  % Final   Neutro Abs 07/04/2022 5.9  1.7 - 7.7 K/uL Final   Lymphocytes Relative 07/04/2022 31  % Final   Lymphs Abs 07/04/2022 3.2  0.7 - 4.0 K/uL Final   Monocytes Relative 07/04/2022 8  % Final   Monocytes Absolute 07/04/2022 0.8  0.1 - 1.0 K/uL Final   Eosinophils Relative 07/04/2022 2  % Final   Eosinophils Absolute 07/04/2022 0.2  0.0 - 0.5 K/uL Final   Basophils Relative 07/04/2022 1  % Final   Basophils Absolute 07/04/2022 0.1  0.0 - 0.1 K/uL Final   Immature Granulocytes 07/04/2022 1  % Final   Abs Immature Granulocytes 07/04/2022 0.11 (H)  0.00 - 0.07 K/uL Final   Performed at [facility], [address] [place], [place] [address]   POC  Amphetamine UR 07/04/2022 None Detected  NONE DETECTED (Cut Off Level 1000 ng/mL) Final   POC Secobarbital (BAR) 07/04/2022 None Detected  NONE DETECTED (Cut Off Level 300 ng/mL) Final   POC Buprenorphine (BUP) 07/04/2022 None Detected  NONE DETECTED (Cut Off Level 10 ng/mL) Final  POC Oxazepam (BZO) 07/04/2022 None Detected  NONE DETECTED (Cut Off Level 300 ng/mL) Final   POC Cocaine UR 07/04/2022 None Detected  NONE DETECTED (Cut Off Level 300 ng/mL) Final   POC Methamphetamine UR 07/04/2022 None Detected  NONE DETECTED (Cut Off Level 1000 ng/mL) Final   POC Morphine 07/04/2022 None Detected  NONE DETECTED (Cut Off Level 300 ng/mL) Final   POC Methadone UR 07/04/2022 None Detected  NONE DETECTED (Cut Off Level 300 ng/mL) Final   POC Oxycodone UR 07/04/2022 None Detected  NONE DETECTED (Cut Off Level 100 ng/mL) Final   POC Marijuana UR 07/04/2022 None Detected  NONE DETECTED (Cut Off Level 50 ng/mL) Final   SARSCOV2ONAVIRUS 2 AG 07/04/2022 NEGATIVE  NEGATIVE Final   Comment: (NOTE) SARS-CoV-2 antigen NOT DETECTED.   Negative results are presumptive.  Negative results do not preclude SARS-CoV-2 infection and should not be used as the sole basis for treatment or other patient management decisions, including infection  control decisions, particularly in the presence of clinical signs and  symptoms consistent with COVID-19, or in those who have been in contact with the virus.  Negative results must be combined with clinical observations, patient history, and epidemiological information. The expected result is Negative.  Fact Sheet for Patients: https://www.jennings-kim.com/  Fact Sheet for Healthcare Providers: https://alexander-rogers.biz/  This test is not yet approved or cleared by the [place] FDA and  has been authorized for detection and/or diagnosis of SARS-CoV-2 by FDA under an Emergency Use Authorization (EUA).  This EUA will remain in effect  (meaning this test can be used) for the duration of  the COV                          ID-19 declaration under Section 564(b)(1) of the Act, 21 U.S.C. section 360bbb-3(b)(1), unless the authorization is terminated or revoked sooner.    Admission on 06/03/2022, Discharged on 06/07/2022  Component Date Value Ref Range Status   Sodium 06/03/2022 137  135 - 145 mmol/L Final   Potassium 06/03/2022 3.5  3.5 - 5.1 mmol/L Final   Chloride 06/03/2022 107  98 - 111 mmol/L Final   CO2 06/03/2022 22  22 - 32 mmol/L Final   Glucose, Bld 06/03/2022 99  70 - 99 mg/dL Final   Glucose reference range applies only to samples taken after fasting for at least 8 hours.   BUN 06/03/2022 7  6 - 20 mg/dL Final   Creatinine, Ser 06/03/2022 0.93  0.61 - 1.24 mg/dL Final   Calcium 64/33/2951 9.1  8.9 - 10.3 mg/dL Final   Total Protein 88/41/6606 7.4  6.5 - 8.1 g/dL Final   Albumin 30/16/0109 4.4  3.5 - 5.0 g/dL Final   AST 32/35/5732 19  15 - 41 U/L Final   ALT 06/03/2022 13  0 - 44 U/L Final   Alkaline Phosphatase 06/03/2022 57  38 - 126 U/L Final   Total Bilirubin 06/03/2022 0.9  0.3 - 1.2 mg/dL Final   GFR, Estimated 06/03/2022 >60  >60 mL/min Final   Comment: (NOTE) Calculated using the CKD-EPI Creatinine Equation (2021)    Anion gap 06/03/2022 8  5 - 15 Final   Performed at [facility], [address] [place], [place] [address]   Alcohol, Ethyl (B) 06/03/2022 <10  <10 mg/dL Final   Comment: (NOTE) Lowest detectable limit for serum alcohol is 10 mg/dL.  For medical purposes only. Performed at [facility],  [address] [place], [place] [address]    Salicylate Lvl 06/03/2022 <7.0 (L)  7.0 - 30.0 mg/dL Final   Performed at [facility], [address] [place], [place] [address]   Acetaminophen (Tylenol), Serum 06/03/2022 <10 (L)  10 - 30 ug/mL Final   Comment: (NOTE) Therapeutic concentrations vary significantly. A range of 10-30 ug/mL  may be an effective  concentration for many patients. However, some  are best treated at concentrations outside of this range. Acetaminophen concentrations >150 ug/mL at 4 hours after ingestion  and >50 ug/mL at 12 hours after ingestion are often associated with  toxic reactions.  Performed at [facility], [address] [place], [place] [address]    WBC 06/03/2022 8.5  4.0 - 10.5 K/uL Final   RBC 06/03/2022 5.11  4.22 - 5.81 MIL/uL Final   Hemoglobin 06/03/2022 14.8  13.0 - 17.0 g/dL Final   HCT 12/04/7626 43.8  39.0 - 52.0 % Final   MCV 06/03/2022 85.7  80.0 - 100.0 fL Final   MCH 06/03/2022 29.0  26.0 - 34.0 pg Final   MCHC 06/03/2022 33.8  30.0 - 36.0 g/dL Final   RDW 31/51/7616 14.0  11.5 - 15.5 % Final   Platelets 06/03/2022 249  150 - 400 K/uL Final   nRBC 06/03/2022 0.0  0.0 - 0.2 % Final   Performed at [facility], [address] [place], [place] [address]   Opiates 06/03/2022 NONE DETECTED  NONE DETECTED Final   Cocaine 06/03/2022 NONE DETECTED  NONE DETECTED Final   Benzodiazepines 06/03/2022 NONE DETECTED  NONE DETECTED Final   Amphetamines 06/03/2022 POSITIVE (A)  NONE DETECTED Final   Tetrahydrocannabinol 06/03/2022 POSITIVE (A)  NONE DETECTED Final   Barbiturates 06/03/2022 NONE DETECTED  NONE DETECTED Final   Comment: (NOTE) DRUG SCREEN FOR MEDICAL PURPOSES ONLY.  IF CONFIRMATION IS NEEDED FOR ANY PURPOSE, NOTIFY LAB WITHIN 5 DAYS.  LOWEST DETECTABLE LIMITS FOR URINE DRUG SCREEN Drug Class                     Cutoff (ng/mL) Amphetamine and metabolites    1000 Barbiturate and metabolites    200 Benzodiazepine                 200 Opiates and metabolites        300 Cocaine and metabolites        300 THC                            50 Performed at [facility], [address] [place], [place] [address]    SARS Coronavirus 2 by RT PCR 06/03/2022 NEGATIVE  NEGATIVE Final   Comment: (NOTE) SARS-CoV-2 target nucleic acids are NOT DETECTED.  The  SARS-CoV-2 RNA is generally detectable in upper respiratory specimens during the acute phase of infection. The lowest concentration of SARS-CoV-2 viral copies this assay can detect is 138 copies/mL. A negative result does not preclude SARS-Cov-2 infection and should not be used as the sole basis for treatment or other patient management decisions. A negative result may occur with  improper specimen collection/handling, submission of specimen other than nasopharyngeal swab, presence of viral mutation(s) within the areas targeted by this assay, and inadequate number of viral copies(<138 copies/mL). A negative result must be combined with clinical observations, patient history, and epidemiological information. The expected result is  Negative.  Fact Sheet for Patients:  BloggerCourse.comhttps://www.fda.gov/media/152166/download  Fact Sheet for Healthcare Providers:  SeriousBroker.ithttps://www.fda.gov/media/152162/download  This test is no                          t yet approved or cleared by the [place] FDA and  has been authorized for detection and/or diagnosis of SARS-CoV-2 by FDA under an Emergency Use Authorization (EUA). This EUA will remain  in effect (meaning this test can be used) for the duration of the COVID-19 declaration under Section 564(b)(1) of the Act, 21 U.S.C.section 360bbb-3(b)(1), unless the authorization is terminated  or revoked sooner.       Influenza A by PCR 06/03/2022 NEGATIVE  NEGATIVE Final   Influenza B by PCR 06/03/2022 NEGATIVE  NEGATIVE Final   Comment: (NOTE) The Xpert Xpress SARS-CoV-2/FLU/RSV plus assay is intended as an aid in the diagnosis of influenza from Nasopharyngeal swab specimens and should not be used as a sole basis for treatment. Nasal washings and aspirates are unacceptable for Xpert Xpress SARS-CoV-2/FLU/RSV testing.  Fact Sheet for Patients: BloggerCourse.comhttps://www.fda.gov/media/152166/download  Fact Sheet for Healthcare  Providers: SeriousBroker.ithttps://www.fda.gov/media/152162/download  This test is not yet approved or cleared by the [place] FDA and has been authorized for detection and/or diagnosis of SARS-CoV-2 by FDA under an Emergency Use Authorization (EUA). This EUA will remain in effect (meaning this test can be used) for the duration of the COVID-19 declaration under Section 564(b)(1) of the Act, 21 U.S.C. section 360bbb-3(b)(1), unless the authorization is terminated or revoked.     Resp Syncytial Virus by PCR 06/03/2022 NEGATIVE  NEGATIVE Final   Comment: (NOTE) Fact Sheet for Patients: BloggerCourse.comhttps://www.fda.gov/media/152166/download  Fact Sheet for Healthcare Providers: SeriousBroker.ithttps://www.fda.gov/media/152162/download  This test is not yet approved or cleared by the [place] FDA and has been authorized for detection and/or diagnosis of SARS-CoV-2 by FDA under an Emergency Use Authorization (EUA). This EUA will remain in effect (meaning this test can be used) for the duration of the COVID-19 declaration under Section 564(b)(1) of the Act, 21 U.S.C. section 360bbb-3(b)(1), unless the authorization is terminated or revoked.  Performed at [facility], [address] [place], [place] [address]   Admission on 04/26/2022, Discharged on 04/27/2022  Component Date Value Ref Range Status   WBC 04/26/2022 9.3  4.0 - 10.5 K/uL Final   RBC 04/26/2022 5.25  4.22 - 5.81 MIL/uL Final   Hemoglobin 04/26/2022 15.4  13.0 - 17.0 g/dL Final   HCT 31/51/761611/14/2023 46.4  39.0 - 52.0 % Final   MCV 04/26/2022 88.4  80.0 - 100.0 fL Final   MCH 04/26/2022 29.3  26.0 - 34.0 pg Final   MCHC 04/26/2022 33.2  30.0 - 36.0 g/dL Final   RDW 07/37/106211/14/2023 14.0  11.5 - 15.5 % Final   Platelets 04/26/2022 250  150 - 400 K/uL Final   nRBC 04/26/2022 0.0  0.0 - 0.2 % Final   Neutrophils Relative % 04/26/2022 47  % Final   Neutro Abs 04/26/2022 4.3  1.7 - 7.7 K/uL Final   Lymphocytes Relative 04/26/2022 46  % Final   Lymphs  Abs 04/26/2022 4.3 (H)  0.7 - 4.0 K/uL Final   Monocytes Relative 04/26/2022 6  % Final   Monocytes Absolute 04/26/2022 0.5  0.1 - 1.0 K/uL Final   Eosinophils Relative 04/26/2022 1  % Final   Eosinophils Absolute 04/26/2022 0.1  0.0 - 0.5 K/uL Final   Basophils Relative 04/26/2022 0  %  Final   Basophils Absolute 04/26/2022 0.0  0.0 - 0.1 K/uL Final   Immature Granulocytes 04/26/2022 0  % Final   Abs Immature Granulocytes 04/26/2022 0.02  0.00 - 0.07 K/uL Final   Performed at [facility], [address], [place], [place] [address]   Sodium 04/26/2022 140  135 - 145 mmol/L Final   Potassium 04/26/2022 3.8  3.5 - 5.1 mmol/L Final   Chloride 04/26/2022 105  98 - 111 mmol/L Final   CO2 04/26/2022 29  22 - 32 mmol/L Final   Glucose, Bld 04/26/2022 86  70 - 99 mg/dL Final   Glucose reference range applies only to samples taken after fasting for at least 8 hours.   BUN 04/26/2022 10  6 - 20 mg/dL Final   Creatinine, Ser 04/26/2022 1.02  0.61 - 1.24 mg/dL Final   Calcium 93/81/0175 9.5  8.9 - 10.3 mg/dL Final   Total Protein 04/06/8526 7.0  6.5 - 8.1 g/dL Final   Albumin 78/24/2353 4.5  3.5 - 5.0 g/dL Final   AST 61/44/3154 15  15 - 41 U/L Final   ALT 04/26/2022 14  0 - 44 U/L Final   Alkaline Phosphatase 04/26/2022 48  38 - 126 U/L Final   Total Bilirubin 04/26/2022 0.6  0.3 - 1.2 mg/dL Final   GFR, Estimated 04/26/2022 >60  >60 mL/min Final   Comment: (NOTE) Calculated using the CKD-EPI Creatinine Equation (2021)    Anion gap 04/26/2022 6  5 - 15 Final   Performed at [facility], [address] [place], [place] [address]   Alcohol, Ethyl (B) 04/26/2022 <10  <10 mg/dL Final   Comment: (NOTE) Lowest detectable limit for serum alcohol is 10 mg/dL.  For medical purposes only. Performed at [facility], [address] [place], [place] [address]    Opiates 04/26/2022 NONE DETECTED  NONE DETECTED Final   Cocaine 04/26/2022 NONE  DETECTED  NONE DETECTED Final   Benzodiazepines 04/26/2022 NONE DETECTED  NONE DETECTED Final   Amphetamines 04/26/2022 POSITIVE (A)  NONE DETECTED Final   Tetrahydrocannabinol 04/26/2022 POSITIVE (A)  NONE DETECTED Final   Barbiturates 04/26/2022 NONE DETECTED  NONE DETECTED Final   Comment: (NOTE) DRUG SCREEN FOR MEDICAL PURPOSES ONLY.  IF CONFIRMATION IS NEEDED FOR ANY PURPOSE, NOTIFY LAB WITHIN 5 DAYS.  LOWEST DETECTABLE LIMITS FOR URINE DRUG SCREEN Drug Class                     Cutoff (ng/mL) Amphetamine and metabolites    1000 Barbiturate and metabolites    200 Benzodiazepine                 200 Opiates and metabolites        300 Cocaine and metabolites        300 THC                            50 Performed at [facility], [address] [place] [address], [place], [place] [address]     Allergies: Amoxicillin, Penicillins, Other, Pertussis vaccines, and Blue mussel [mytilus]  Medications:  Facility Ordered Medications  Medication   acetaminophen (TYLENOL) tablet 650 mg   alum & mag hydroxide-simeth (MAALOX/MYLANTA) 200-200-20 MG/5ML suspension 30 mL   magnesium hydroxide (MILK OF MAGNESIA) suspension 30 mL   hydrOXYzine (ATARAX) tablet 25 mg   traZODone (DESYREL) tablet 50 mg   OLANZapine (ZYPREXA) tablet 7.5  mg   ziprasidone (GEODON) injection 20 mg   And   LORazepam (ATIVAN) tablet 1 mg   PTA Medications  Medication Sig   cloNIDine (CATAPRES) 0.1 MG tablet Take 1 tablet (0.1 mg total) by mouth every 12 (twelve) hours. (Patient not taking: Reported on 06/05/2022)   amitriptyline (ELAVIL) 25 MG tablet Take 1 tablet (25 mg total) by mouth at bedtime. (Patient not taking: Reported on 06/05/2022)   QUEtiapine (SEROQUEL XR) 400 MG 24 hr tablet Take 1 tablet (400 mg total) by mouth daily after supper. (Patient not taking: Reported on 06/05/2022)   QUEtiapine (SEROQUEL XR) 200 MG 24 hr tablet Take 1 tablet (200 mg total) by mouth in the morning. (Patient not taking:  Reported on 06/05/2022)   QUEtiapine (SEROQUEL XR) 50 MG TB24 24 hr tablet Take 2 tablets (100 mg total) by mouth daily after lunch. (Patient not taking: Reported on 06/05/2022)   traZODone (DESYREL) 50 MG tablet Take 1 tablet (50 mg total) by mouth at bedtime as needed for sleep. (Patient not taking: Reported on 06/05/2022)   Divalproex Sodium (DEPAKOTE PO) Take by mouth.   SERTRALINE HCL PO Take 1 tablet by mouth daily.   QUEtiapine (SEROQUEL) 100 MG tablet Take 100 mg by mouth daily.    Medical Decision Making  Recommend inpatient psychiatric admission for stabilization and treatment.  Lab Orders         Resp panel by RT-PCR (RSV, Flu A&B, Covid) Anterior Nasal Swab         CBC with Differential/Platelet         Comprehensive metabolic panel         Hemoglobin A1c         Lithium level         Valproic acid level         Lipid panel         TSH         POCT Urine Drug Screen - (I-Screen)         POC SARS Coronavirus 2 Ag      EKG Medications ordered at this encounter -Agitation protocol meds Geodon 20 mg IM every 12 hours as needed agitation And Ativan 1 mg tablet p.o. as needed, anxiety, severe agitation x 1 dose  Zyprexa 7.5 mg p.o. nightly mood stabilization/hallucinations  Order PRNs -Tylenol 650 mg p.o. every 6 hours as needed pain -Maalox 30 ml p.o. every 4 hours as needed indigestion -Atarax 25 mg p.o. 3 times daily as needed anxiety -MOM 30 ml p.o. daily as needed constipation -Trazodone 50 mg p.o. nightly as needed sleep  Home medications under review pending laboratory levels obtained Lithium level obtained and low at 0.49- Lithium carbonate restarted at 300 mg Po TID with extra 300 mg added for mood stabilization. Valproic acid level normal at 52-Restart Depakote 500 mg PO BID/mood stabilization. No adjustments.   Pottasium level slighty low at 3.4. Ordered 10 MEQ of Klor-Con CR tab.  Free T3 and Free T4 labs ordered for elevated TSH levels of 5.790    Recommendations  Based on my evaluation the patient does not appear to have an emergency medical condition.  Recommend inpatient psychiatric admission for stabilization and treatment.  Mancel Balehinwendu V Kassidee Narciso, NP 07/04/22  11:58 PM

## 2022-07-04 NOTE — ED Notes (Signed)
Pt is in the shower at this hour. No apparent distress. RR even and unlabored. Pt came to be evaluated because of hallucinations and depression. Taking meds as prescribed. From [facility] and can return once evaluated and discharged. Monitored for safety.

## 2022-07-04 NOTE — ED Notes (Signed)
Pt resting comfortably at this hour. Just took a shower. Generalized pain has decreased. No apparent distress. RR even and unlabored. Monitored for safety.

## 2022-07-05 LAB — COMPREHENSIVE METABOLIC PANEL
ALT: 17 U/L (ref 0–44)
AST: 21 U/L (ref 15–41)
Albumin: 4.2 g/dL (ref 3.5–5.0)
Alkaline Phosphatase: 44 U/L (ref 38–126)
Anion gap: 7 (ref 5–15)
BUN: 12 mg/dL (ref 6–20)
CO2: 30 mmol/L (ref 22–32)
Calcium: 9 mg/dL (ref 8.9–10.3)
Chloride: 102 mmol/L (ref 98–111)
Creatinine, Ser: 0.95 mg/dL (ref 0.61–1.24)
GFR, Estimated: >60 mL/min (ref >60)
Glucose, Bld: 71 mg/dL (ref 70–99)
Potassium: 3.4 mmol/L — ABNORMAL LOW (ref 3.5–5.1)
Sodium: 139 mmol/L (ref 135–145)
Total Bilirubin: 0.3 mg/dL (ref 0.3–1.2)
Total Protein: 6.7 g/dL (ref 6.5–8.1)

## 2022-07-05 LAB — LIPID PANEL
Cholesterol: 150 mg/dL (ref 0–200)
HDL: 50 mg/dL (ref >40)
LDL Cholesterol: 79 mg/dL (ref 0–99)
Total CHOL/HDL Ratio: 3 RATIO
Triglycerides: 106 mg/dL (ref <150)
VLDL: 21 mg/dL (ref 0–40)

## 2022-07-05 LAB — HEMOGLOBIN A1C
Hgb A1c MFr Bld: 4.9 % (ref 4.8–5.6)
Mean Plasma Glucose: 93.93 mg/dL

## 2022-07-05 LAB — RESP PANEL BY RT-PCR (RSV, FLU A&B, COVID)  RVPGX2
Influenza A by PCR: NEGATIVE
Influenza B by PCR: NEGATIVE
Resp Syncytial Virus by PCR: NEGATIVE
SARS Coronavirus 2 by RT PCR: NEGATIVE

## 2022-07-05 LAB — LITHIUM LEVEL: Lithium Lvl: 0.49 mmol/L — ABNORMAL LOW (ref 0.60–1.20)

## 2022-07-05 LAB — TSH: TSH: 5.79 u[IU]/mL — ABNORMAL HIGH (ref 0.350–4.500)

## 2022-07-05 LAB — VALPROIC ACID LEVEL: Valproic Acid Lvl: 52 ug/mL (ref 50.0–100.0)

## 2022-07-05 LAB — T4, FREE: Free T4: 0.65 ng/dL (ref 0.61–1.12)

## 2022-07-05 MED ORDER — DIVALPROEX SODIUM 500 MG PO DR TAB
500.0000 mg | DELAYED_RELEASE_TABLET | Freq: Two times a day (BID) | ORAL | Status: DC
  Administered 2022-07-05 – 2022-07-07 (×5): 500 mg via ORAL
  Filled 2022-07-05 (×5): qty 1

## 2022-07-05 MED ORDER — LITHIUM CARBONATE ER 300 MG PO TBCR: 300.0000 mg | EXTENDED_RELEASE_TABLET | Freq: Two times a day (BID) | ORAL | Status: DC

## 2022-07-05 MED ORDER — LITHIUM CARBONATE ER 450 MG PO TBCR: 450.0000 mg | EXTENDED_RELEASE_TABLET | Freq: Once | ORAL | Status: DC

## 2022-07-05 MED ORDER — LITHIUM CARBONATE ER 300 MG PO TBCR
300.0000 mg | EXTENDED_RELEASE_TABLET | Freq: Three times a day (TID) | ORAL | Status: DC
  Administered 2022-07-05 – 2022-07-07 (×7): 300 mg via ORAL
  Filled 2022-07-05 (×7): qty 1

## 2022-07-05 MED ORDER — LITHIUM CARBONATE ER 300 MG PO TBCR
300.0000 mg | EXTENDED_RELEASE_TABLET | Freq: Once | ORAL | Status: AC
  Administered 2022-07-05: 300 mg via ORAL
  Filled 2022-07-05: qty 1

## 2022-07-05 MED ORDER — POTASSIUM CHLORIDE CRYS ER 20 MEQ PO TBCR: 20.0000 meq | EXTENDED_RELEASE_TABLET | Freq: Once | ORAL | Status: DC

## 2022-07-05 MED ORDER — POTASSIUM CHLORIDE CRYS ER 10 MEQ PO TBCR
10.0000 meq | EXTENDED_RELEASE_TABLET | Freq: Once | ORAL | Status: AC
  Administered 2022-07-05: 10 meq via ORAL
  Filled 2022-07-05: qty 1

## 2022-07-05 NOTE — Discharge Instructions (Addendum)
Base on the information you have provided and the presenting issue, outpatient services and resources for substance abuse treatment  have been recommended.  It is imperative that you follow through with treatment recommendations within 5-7 days from the of discharge to mitigate further risk to your safety and mental well-being. A list of referrals has been provided below to get you started.  You are not limited to the list provided.  In case of an urgent crisis, you may contact the Mobile Crisis Unit with [facility] at [phone].   [facility] ([facility] is a [facility] with the mission of reducing opioid-related mortality in [place]. [facility] provides post-overdose response, support to individuals involved in the justice system, naloxone (Narcan) distribution, and mobile syringe services. All services are provided at no cost to program participants. Text or Call: 3010855213 Email: info@[facility] .org   Outpatient Treatment Outpatient treatment may involve a combination of  any of the following: medication, individual counseling, group counseling, and case management (assistance accessing other community resources).  Research shows that medication should be the first line of treatment when treating opioid use disorder, usually combined with counseling.    [facility] [address], [place], [place] [address]  Phone: [phone] Offers Subutex and Methadone, as well as  intensive outpatient treatment If you do not have health insurance, services are available at no charge to eligible individuals through funding provided by [facility]. [facility] also accepts Medicaid, Medicare, and is in-network with a wide array of private insurance providers.  [facility] [address] [place], [place] [address]  [phone] Offers transitional/supportive housing,  intensive outpatient treatment, and  referrals for medication If you do not have health insurance, services are available at no charge to eligible individuals through funding provided by [facility]. [facility] also accepts Medicaid.  [facility], [place], [place] [address]  Phone: [phone] Offers Suboxone and Methadone Accepts Medicaid, Medicare, private insurance, and private pay [phone]  daily for methadone)  [facility] [address], [place], [place] [address]  Phone: [phone] Offers Suboxone and Subutex Accepts Medicaid, Medicare, and  private insurance  [facility] [address] [place], [place] [address]  Phone: [phone] [address] [place],   [address]  Phone: [phone] Offers Suboxone and Methadone Accepts Medicaid, Medicare, and private pay ($12 daily for Methadone)

## 2022-07-05 NOTE — ED Notes (Signed)
Pt asleep at this hour. No apparent distress. RR even and unlabored. Monitored for safety.

## 2022-07-05 NOTE — ED Notes (Signed)
Pt resting quietly.  Breathing even and unlabored.  Will continue to monitor for safety  ?

## 2022-07-05 NOTE — ED Provider Notes (Cosign Needed Addendum)
Behavioral Health Progress Note  Date and Time: 07/05/2022 11:58 AM Name: Cal Gindlesperger MRN:  [id number]  Subjective:   Doryan Bahl is a 27 y.o. male with PMH of SCzA v SIPD, stimulant use d/o (meth), MDD, PTSD, inpatient psych admission, who presented voluntary to [facility] (07/04/2022) from [facility] with [facility] staff Ms. Shanon Wonton [phone] for SI in the setting argument with another patient at [facility].   Patient is alert and oriented x4, and appears pleasant, calm, and cooperative when responding to staff. Thought and speech is linear, logical, coherent, organized.   Evaluation on unit: Patient reports feeling good. Patient reports hearing own voices in his head that tell him to hurt himself and to jump over the nursing station, but reports he has being ignoring the voices telling him to do so because staff would intervene and he is currently on parol/ probation. He reports the voices are " rude to him", but he is unconcerned. [facility] (M[facility]) and reports being sober for 33 days. His current medications are lithium, valproate, and Olanzapine. Patient has been compliant with rx and denies any side effects, except some initial mild dizziness that goes away shortly after.  Patient states he is sleeping fair and appetite is good. He denies any current SI or HI. Denies AVH since this morning. Denies paranoia. Did not appear internally/externally preoccupied.   Diagnosis:  Final diagnoses:  Severe auditory hallucinations  Suicidal ideations  Homicidal ideations  Tactile hallucinations    Total Time spent with patient: 30 minutes  Past Psychiatric History: MDD, PTSD, Polysubstance abuse Past Medical History: NA Family History: NA Family Psychiatric  History: NA Social History: patient has been homeless for the last 2 years.  Additional Social History:    Pain Medications: See MAR Prescriptions: See  MAR Over the Counter: See MAR History of alcohol / drug use?: Yes Longest period of sobriety (when/how long): Had two years of being sober tehn relapsed for 4 months.  He is currently 33 days sober. Negative Consequences of Use: Personal relationships, Financial Withdrawal Symptoms: None                    Sleep: Fair  Appetite:  Good  Current Medications:  Current Facility-Administered Medications  Medication Dose Route Frequency Provider Last Rate Last Admin   acetaminophen (TYLENOL) tablet 650 mg  650 mg Oral Q6H PRN Onuoha, Chinwendu V, NP   650 mg at 07/04/22 2246   alum & mag hydroxide-simeth (MAALOX/MYLANTA) 200-200-20 MG/5ML suspension 30 mL  30 mL Oral Q4H PRN Onuoha, Chinwendu V, NP       divalproex (DEPAKOTE) DR tablet 500 mg  500 mg Oral Q12H Onuoha, Chinwendu V, NP   500 mg at 07/05/22 0947   hydrOXYzine (ATARAX) tablet 25 mg  25 mg Oral TID PRN Onuoha, Chinwendu V, NP   25 mg at 07/04/22 2246   lithium carbonate (LITHOBID) ER tablet 300 mg  300 mg Oral TID Onuoha, Chinwendu V, NP   300 mg at 07/05/22 0947   ziprasidone (GEODON) injection 20 mg  20 mg Intramuscular Q12H PRN Onuoha, Chinwendu V, NP       And   LORazepam (ATIVAN) tablet 1 mg  1 mg Oral PRN Onuoha, Chinwendu V, NP       magnesium hydroxide (MILK OF MAGNESIA) suspension 30 mL  30 mL Oral Daily PRN Onuoha, Chinwendu V, NP       OLANZapine (ZYPREXA) tablet  7.5 mg  7.5 mg Oral QHS Onuoha, Chinwendu V, NP   7.5 mg at 07/04/22 2246   traZODone (DESYREL) tablet 50 mg  50 mg Oral QHS PRN Onuoha, Chinwendu V, NP   50 mg at 07/04/22 2246   Current Outpatient Medications  Medication Sig Dispense Refill   albuterol (VENTOLIN HFA) 108 (90 Base) MCG/ACT inhaler Inhale 2 puffs into the lungs every 6 (six) hours as needed for wheezing or shortness of breath.     divalproex (DEPAKOTE) 500 MG DR tablet Take 500 mg by mouth 2 (two) times daily.     hydrOXYzine (VISTARIL) 25 MG capsule Take 25 mg by mouth every 6 (six)  hours as needed for anxiety.     lithium carbonate (LITHOBID) 300 MG ER tablet Take 300 mg by mouth 3 (three) times daily.     OLANZapine (ZYPREXA) 15 MG tablet Take 15 mg by mouth at bedtime.     OLANZapine (ZYPREXA) 5 MG tablet Take 5 mg by mouth daily.      Labs  Lab Results:  Admission on 07/04/2022  Component Date Value Ref Range Status   SARS Coronavirus 2 by RT PCR 07/04/2022 NEGATIVE  NEGATIVE Final   Comment: (NOTE) SARS-CoV-2 target nucleic acids are NOT DETECTED.  The SARS-CoV-2 RNA is generally detectable in upper respiratory specimens during the acute phase of infection. The lowest concentration of SARS-CoV-2 viral copies this assay can detect is 138 copies/mL. A negative result does not preclude SARS-Cov-2 infection and should not be used as the sole basis for treatment or other patient management decisions. A negative result may occur with  improper specimen collection/handling, submission of specimen other than nasopharyngeal swab, presence of viral mutation(s) within the areas targeted by this assay, and inadequate number of viral copies(<138 copies/mL). A negative result must be combined with clinical observations, patient history, and epidemiological information. The expected result is Negative.  Fact Sheet for Patients:  BloggerCourse.comhttps://www.fda.gov/media/152166/download  Fact Sheet for Healthcare Providers:  SeriousBroker.ithttps://www.fda.gov/media/152162/download  This test is no                          t yet approved or cleared by the [place] FDA and  has been authorized for detection and/or diagnosis of SARS-CoV-2 by FDA under an Emergency Use Authorization (EUA). This EUA will remain  in effect (meaning this test can be used) for the duration of the COVID-19 declaration under Section 564(b)(1) of the Act, 21 U.S.C.section 360bbb-3(b)(1), unless the authorization is terminated  or revoked sooner.       Influenza A by PCR 07/04/2022 NEGATIVE  NEGATIVE Final    Influenza B by PCR 07/04/2022 NEGATIVE  NEGATIVE Final   Comment: (NOTE) The Xpert Xpress SARS-CoV-2/FLU/RSV plus assay is intended as an aid in the diagnosis of influenza from Nasopharyngeal swab specimens and should not be used as a sole basis for treatment. Nasal washings and aspirates are unacceptable for Xpert Xpress SARS-CoV-2/FLU/RSV testing.  Fact Sheet for Patients: BloggerCourse.comhttps://www.fda.gov/media/152166/download  Fact Sheet for Healthcare Providers: SeriousBroker.ithttps://www.fda.gov/media/152162/download  This test is not yet approved or cleared by the [place] FDA and has been authorized for detection and/or diagnosis of SARS-CoV-2 by FDA under an Emergency Use Authorization (EUA). This EUA will remain in effect (meaning this test can be used) for the duration of the COVID-19 declaration under Section 564(b)(1) of the Act, 21 U.S.C. section 360bbb-3(b)(1), unless the authorization is terminated or revoked.     Resp Syncytial Virus by  PCR 07/04/2022 NEGATIVE  NEGATIVE Final   Comment: (NOTE) Fact Sheet for Patients: BloggerCourse.com  Fact Sheet for Healthcare Providers: SeriousBroker.it  This test is not yet approved or cleared by the [place] FDA and has been authorized for detection and/or diagnosis of SARS-CoV-2 by FDA under an Emergency Use Authorization (EUA). This EUA will remain in effect (meaning this test can be used) for the duration of the COVID-19 declaration under Section 564(b)(1) of the Act, 21 U.S.C. section 360bbb-3(b)(1), unless the authorization is terminated or revoked.  Performed at [facility], [address] [place], [place] [address]    WBC 07/04/2022 10.2  4.0 - 10.5 K/uL Final   RBC 07/04/2022 4.66  4.22 - 5.81 MIL/uL Final   Hemoglobin 07/04/2022 13.8  13.0 - 17.0 g/dL Final   HCT 65/78/4696 41.0  39.0 - 52.0 % Final   MCV 07/04/2022 88.0  80.0 - 100.0 fL Final   MCH 07/04/2022 29.6   26.0 - 34.0 pg Final   MCHC 07/04/2022 33.7  30.0 - 36.0 g/dL Final   RDW 29/52/8413 14.0  11.5 - 15.5 % Final   Platelets 07/04/2022 259  150 - 400 K/uL Final   nRBC 07/04/2022 0.0  0.0 - 0.2 % Final   Neutrophils Relative % 07/04/2022 57  % Final   Neutro Abs 07/04/2022 5.9  1.7 - 7.7 K/uL Final   Lymphocytes Relative 07/04/2022 31  % Final   Lymphs Abs 07/04/2022 3.2  0.7 - 4.0 K/uL Final   Monocytes Relative 07/04/2022 8  % Final   Monocytes Absolute 07/04/2022 0.8  0.1 - 1.0 K/uL Final   Eosinophils Relative 07/04/2022 2  % Final   Eosinophils Absolute 07/04/2022 0.2  0.0 - 0.5 K/uL Final   Basophils Relative 07/04/2022 1  % Final   Basophils Absolute 07/04/2022 0.1  0.0 - 0.1 K/uL Final   Immature Granulocytes 07/04/2022 1  % Final   Abs Immature Granulocytes 07/04/2022 0.11 (H)  0.00 - 0.07 K/uL Final   Performed at [facility], [address] [place], [place] [address]   Sodium 07/04/2022 139  135 - 145 mmol/L Final   Potassium 07/04/2022 3.4 (L)  3.5 - 5.1 mmol/L Final   Chloride 07/04/2022 102  98 - 111 mmol/L Final   CO2 07/04/2022 30  22 - 32 mmol/L Final   Glucose, Bld 07/04/2022 71  70 - 99 mg/dL Final   Glucose reference range applies only to samples taken after fasting for at least 8 hours.   BUN 07/04/2022 12  6 - 20 mg/dL Final   Creatinine, Ser 07/04/2022 0.95  0.61 - 1.24 mg/dL Final   Calcium 02/72/5366 9.0  8.9 - 10.3 mg/dL Final   Total Protein 44/08/4740 6.7  6.5 - 8.1 g/dL Final   Albumin 59/56/3875 4.2  3.5 - 5.0 g/dL Final   AST 64/33/2951 21  15 - 41 U/L Final   ALT 07/04/2022 17  0 - 44 U/L Final   Alkaline Phosphatase 07/04/2022 44  38 - 126 U/L Final   Total Bilirubin 07/04/2022 0.3  0.3 - 1.2 mg/dL Final   GFR, Estimated 07/04/2022 >60  >60 mL/min Final   Comment: (NOTE) Calculated using the CKD-EPI Creatinine Equation (2021)    Anion gap 07/04/2022 7  5 - 15 Final   Performed at [facility], [address] [place], [place]  [address]   Hgb A1c MFr Bld 07/04/2022 4.9  4.8 - 5.6 % Final  Comment: (NOTE) Pre diabetes:          5.7%-6.4%  Diabetes:              >6.4%  Glycemic control for   <7.0% adults with diabetes    Mean Plasma Glucose 07/04/2022 93.93  mg/dL Final   Performed at [facility], [address] [place], [place] [address]   POC Amphetamine UR 07/04/2022 None Detected  NONE DETECTED (Cut Off Level 1000 ng/mL) Final   POC Secobarbital (BAR) 07/04/2022 None Detected  NONE DETECTED (Cut Off Level 300 ng/mL) Final   POC Buprenorphine (BUP) 07/04/2022 None Detected  NONE DETECTED (Cut Off Level 10 ng/mL) Final   POC Oxazepam (BZO) 07/04/2022 None Detected  NONE DETECTED (Cut Off Level 300 ng/mL) Final   POC Cocaine UR 07/04/2022 None Detected  NONE DETECTED (Cut Off Level 300 ng/mL) Final   POC Methamphetamine UR 07/04/2022 None Detected  NONE DETECTED (Cut Off Level 1000 ng/mL) Final   POC Morphine 07/04/2022 None Detected  NONE DETECTED (Cut Off Level 300 ng/mL) Final   POC Methadone UR 07/04/2022 None Detected  NONE DETECTED (Cut Off Level 300 ng/mL) Final   POC Oxycodone UR 07/04/2022 None Detected  NONE DETECTED (Cut Off Level 100 ng/mL) Final   POC Marijuana UR 07/04/2022 None Detected  NONE DETECTED (Cut Off Level 50 ng/mL) Final   Lithium Lvl 07/04/2022 0.49 (L)  0.60 - 1.20 mmol/L Final   Performed at [facility], [address], [place], [place] [address]   Valproic Acid Lvl 07/04/2022 52  50.0 - 100.0 ug/mL Final   Performed at [facility], [address], [place], [place] [address]   SARSCOV2ONAVIRUS 2 AG 07/04/2022 NEGATIVE  NEGATIVE Final   Comment: (NOTE) SARS-CoV-2 antigen NOT DETECTED.   Negative results are presumptive.  Negative results do not preclude SARS-CoV-2 infection and should not be used as the sole basis for treatment or other patient management decisions, including infection  control decisions, particularly in the presence of clinical signs and   symptoms consistent with COVID-19, or in those who have been in contact with the virus.  Negative results must be combined with clinical observations, patient history, and epidemiological information. The expected result is Negative.  Fact Sheet for Patients: https://www.jennings-kim.com/  Fact Sheet for Healthcare Providers: https://alexander-rogers.biz/  This test is not yet approved or cleared by the [place] FDA and  has been authorized for detection and/or diagnosis of SARS-CoV-2 by FDA under an Emergency Use Authorization (EUA).  This EUA will remain in effect (meaning this test can be used) for the duration of  the COV                          ID-19 declaration under Section 564(b)(1) of the Act, 21 U.S.C. section 360bbb-3(b)(1), unless the authorization is terminated or revoked sooner.     Cholesterol 07/04/2022 150  0 - 200 mg/dL Final   Triglycerides 91/47/8295 106  <150 mg/dL Final   HDL 62/13/0865 50  >40 mg/dL Final   Total CHOL/HDL Ratio 07/04/2022 3.0  RATIO Final   VLDL 07/04/2022 21  0 - 40 mg/dL Final   LDL Cholesterol 07/04/2022 79  0 - 99 mg/dL Final   Comment:        Total Cholesterol/HDL:CHD Risk Coronary Heart Disease Risk Table  Men   Women  1/2 Average Risk   3.4   3.3  Average Risk       5.0   4.4  2 X Average Risk   9.6   7.1  3 X Average Risk  23.4   11.0        Use the calculated Patient Ratio above and the CHD Risk Table to determine the patient's CHD Risk.        ATP III CLASSIFICATION (LDL):  <100     mg/dL   Optimal  100-129  mg/dL   Near or Above                    Optimal  130-159  mg/dL   Borderline  160-189  mg/dL   High  >190     mg/dL   Very High Performed at [facility] [address] [place], [place] [address]    TSH 07/04/2022 5.790 (H)  0.350 - 4.500 uIU/mL Final   Comment: Performed by a 3rd Generation assay with a functional sensitivity of <=0.01 uIU/mL. Performed  at [facility], [place] [address], [place], [place] [address]    Free T4 07/04/2022 0.65  0.61 - 1.12 ng/dL Final   Comment: (NOTE) Biotin ingestion may interfere with free T4 tests. If the results are inconsistent with the TSH level, previous test results, or the clinical presentation, then consider biotin interference. If needed, order repeat testing after stopping biotin. Performed at [facility], [place] [address] [place], [place] [address]   Admission on 06/03/2022, Discharged on 06/07/2022  Component Date Value Ref Range Status   Sodium 06/03/2022 137  135 - 145 mmol/L Final   Potassium 06/03/2022 3.5  3.5 - 5.1 mmol/L Final   Chloride 06/03/2022 107  98 - 111 mmol/L Final   CO2 06/03/2022 22  22 - 32 mmol/L Final   Glucose, Bld 06/03/2022 99  70 - 99 mg/dL Final   Glucose reference range applies only to samples taken after fasting for at least 8 hours.   BUN 06/03/2022 7  6 - 20 mg/dL Final   Creatinine, Ser 06/03/2022 0.93  0.61 - 1.24 mg/dL Final   Calcium 06/03/2022 9.1  8.9 - 10.3 mg/dL Final   Total Protein 06/03/2022 7.4  6.5 - 8.1 g/dL Final   Albumin 06/03/2022 4.4  3.5 - 5.0 g/dL Final   AST 06/03/2022 19  15 - 41 U/L Final   ALT 06/03/2022 13  0 - 44 U/L Final   Alkaline Phosphatase 06/03/2022 57  38 - 126 U/L Final   Total Bilirubin 06/03/2022 0.9  0.3 - 1.2 mg/dL Final   GFR, Estimated 06/03/2022 >60  >60 mL/min Final   Comment: (NOTE) Calculated using the CKD-EPI Creatinine Equation (2021)    Anion gap 06/03/2022 8  5 - 15 Final   Performed at [facility], [address] [place], [place] [address]   Alcohol, Ethyl (B) 06/03/2022 <10  <10 mg/dL Final   Comment: (NOTE) Lowest detectable limit for serum alcohol is 10 mg/dL.  For medical purposes only. Performed at [facility], [address] [place], [place] [address]    Salicylate Lvl 82/50/0370 <7.0 (L)  7.0 - 30.0 mg/dL Final   Performed at [facility], [address] [place], [place] [address]   Acetaminophen (Tylenol), Serum 06/03/2022 <10 (L)  10 - 30 ug/mL Final   Comment: (  NOTE) Therapeutic concentrations vary significantly. A range of 10-30 ug/mL  may be an effective concentration for many patients. However, some  are best treated at concentrations outside of this range. Acetaminophen concentrations >150 ug/mL at 4 hours after ingestion  and >50 ug/mL at 12 hours after ingestion are often associated with  toxic reactions.  Performed at [facility] [place], [address] [place], [place] [address]    WBC 06/03/2022 8.5  4.0 - 10.5 K/uL Final   RBC 06/03/2022 5.11  4.22 - 5.81 MIL/uL Final   Hemoglobin 06/03/2022 14.8  13.0 - 17.0 g/dL Final   HCT 84/69/629512/22/2023 43.8  39.0 - 52.0 % Final   MCV 06/03/2022 85.7  80.0 - 100.0 fL Final   MCH 06/03/2022 29.0  26.0 - 34.0 pg Final   MCHC 06/03/2022 33.8  30.0 - 36.0 g/dL Final   RDW 28/41/324412/22/2023 14.0  11.5 - 15.5 % Final   Platelets 06/03/2022 249  150 - 400 K/uL Final   nRBC 06/03/2022 0.0  0.0 - 0.2 % Final   Performed at [facility] [place] [facility], [address] [place], [place] [address]   Opiates 06/03/2022 NONE DETECTED  NONE DETECTED Final   Cocaine 06/03/2022 NONE DETECTED  NONE DETECTED Final   Benzodiazepines 06/03/2022 NONE DETECTED  NONE DETECTED Final   Amphetamines 06/03/2022 POSITIVE (A)  NONE DETECTED Final   Tetrahydrocannabinol 06/03/2022 POSITIVE (A)  NONE DETECTED Final   Barbiturates 06/03/2022 NONE DETECTED  NONE DETECTED Final   Comment: (NOTE) DRUG SCREEN FOR MEDICAL PURPOSES ONLY.  IF CONFIRMATION IS NEEDED FOR ANY PURPOSE, NOTIFY LAB WITHIN 5 DAYS.  LOWEST DETECTABLE LIMITS FOR URINE DRUG SCREEN Drug Class                     Cutoff (ng/mL) Amphetamine and metabolites    1000 Barbiturate and metabolites    200 Benzodiazepine                 200 Opiates and metabolites        300 Cocaine and metabolites        300 THC                             50 Performed at [facility] [place] [facility], [address] [place], [place] [address]    SARS Coronavirus 2 by RT PCR 06/03/2022 NEGATIVE  NEGATIVE Final   Comment: (NOTE) SARS-CoV-2 target nucleic acids are NOT DETECTED.  The SARS-CoV-2 RNA is generally detectable in upper respiratory specimens during the acute phase of infection. The lowest concentration of SARS-CoV-2 viral copies this assay can detect is 138 copies/mL. A negative result does not preclude SARS-Cov-2 infection and should not be used as the sole basis for treatment or other patient management decisions. A negative result may occur with  improper specimen collection/handling, submission of specimen other than nasopharyngeal swab, presence of viral mutation(s) within the areas targeted by this assay, and inadequate number of viral copies(<138 copies/mL). A negative result must be combined with clinical observations, patient history, and epidemiological information. The expected result is Negative.  Fact Sheet for Patients:  BloggerCourse.comhttps://www.fda.gov/media/152166/download  Fact Sheet for Healthcare Providers:  SeriousBroker.ithttps://www.fda.gov/media/152162/download  This test is no                          t yet approved or cleared by the [place] FDA  and  has been authorized for detection and/or diagnosis of SARS-CoV-2 by FDA under an Emergency Use Authorization (EUA). This EUA will remain  in effect (meaning this test can be used) for the duration of the COVID-19 declaration under Section 564(b)(1) of the Act, 21 U.S.C.section 360bbb-3(b)(1), unless the authorization is terminated  or revoked sooner.       Influenza A by PCR 06/03/2022 NEGATIVE  NEGATIVE Final   Influenza B by PCR 06/03/2022 NEGATIVE  NEGATIVE Final   Comment: (NOTE) The Xpert Xpress SARS-CoV-2/FLU/RSV plus assay is intended as an aid in the diagnosis of influenza from Nasopharyngeal swab specimens and should not be used as a sole basis for treatment. Nasal  washings and aspirates are unacceptable for Xpert Xpress SARS-CoV-2/FLU/RSV testing.  Fact Sheet for Patients: EntrepreneurPulse.com.au  Fact Sheet for Healthcare Providers: IncredibleEmployment.be  This test is not yet approved or cleared by the [place] FDA and has been authorized for detection and/or diagnosis of SARS-CoV-2 by FDA under an Emergency Use Authorization (EUA). This EUA will remain in effect (meaning this test can be used) for the duration of the COVID-19 declaration under Section 564(b)(1) of the Act, 21 U.S.C. section 360bbb-3(b)(1), unless the authorization is terminated or revoked.     Resp Syncytial Virus by PCR 06/03/2022 NEGATIVE  NEGATIVE Final   Comment: (NOTE) Fact Sheet for Patients: EntrepreneurPulse.com.au  Fact Sheet for Healthcare Providers: IncredibleEmployment.be  This test is not yet approved or cleared by the [place] FDA and has been authorized for detection and/or diagnosis of SARS-CoV-2 by FDA under an Emergency Use Authorization (EUA). This EUA will remain in effect (meaning this test can be used) for the duration of the COVID-19 declaration under Section 564(b)(1) of the Act, 21 U.S.C. section 360bbb-3(b)(1), unless the authorization is terminated or revoked.  Performed at [facility], [address] [place], [place] [address]   Admission on 04/26/2022, Discharged on 04/27/2022  Component Date Value Ref Range Status   WBC 04/26/2022 9.3  4.0 - 10.5 K/uL Final   RBC 04/26/2022 5.25  4.22 - 5.81 MIL/uL Final   Hemoglobin 04/26/2022 15.4  13.0 - 17.0 g/dL Final   HCT 04/26/2022 46.4  39.0 - 52.0 % Final   MCV 04/26/2022 88.4  80.0 - 100.0 fL Final   MCH 04/26/2022 29.3  26.0 - 34.0 pg Final   MCHC 04/26/2022 33.2  30.0 - 36.0 g/dL Final   RDW 04/26/2022 14.0  11.5 - 15.5 % Final   Platelets 04/26/2022 250  150 - 400 K/uL Final   nRBC  04/26/2022 0.0  0.0 - 0.2 % Final   Neutrophils Relative % 04/26/2022 47  % Final   Neutro Abs 04/26/2022 4.3  1.7 - 7.7 K/uL Final   Lymphocytes Relative 04/26/2022 46  % Final   Lymphs Abs 04/26/2022 4.3 (H)  0.7 - 4.0 K/uL Final   Monocytes Relative 04/26/2022 6  % Final   Monocytes Absolute 04/26/2022 0.5  0.1 - 1.0 K/uL Final   Eosinophils Relative 04/26/2022 1  % Final   Eosinophils Absolute 04/26/2022 0.1  0.0 - 0.5 K/uL Final   Basophils Relative 04/26/2022 0  % Final   Basophils Absolute 04/26/2022 0.0  0.0 - 0.1 K/uL Final   Immature Granulocytes 04/26/2022 0  % Final   Abs Immature Granulocytes 04/26/2022 0.02  0.00 - 0.07 K/uL Final   Performed at [facility], [place] [address], [place], [place] [address]   Sodium 04/26/2022 140  135 - 145 mmol/L Final   Potassium 04/26/2022 3.8  3.5 - 5.1 mmol/L Final   Chloride 04/26/2022 105  98 - 111 mmol/L Final   CO2 04/26/2022 29  22 - 32 mmol/L Final   Glucose, Bld 04/26/2022 86  70 - 99 mg/dL Final   Glucose reference range applies only to samples taken after fasting for at least 8 hours.   BUN 04/26/2022 10  6 - 20 mg/dL Final   Creatinine, Ser 04/26/2022 1.02  0.61 - 1.24 mg/dL Final   Calcium 16/10/960411/14/2023 9.5  8.9 - 10.3 mg/dL Final   Total Protein 54/09/811911/14/2023 7.0  6.5 - 8.1 g/dL Final   Albumin 14/78/295611/14/2023 4.5  3.5 - 5.0 g/dL Final   AST 21/30/865711/14/2023 15  15 - 41 U/L Final   ALT 04/26/2022 14  0 - 44 U/L Final   Alkaline Phosphatase 04/26/2022 48  38 - 126 U/L Final   Total Bilirubin 04/26/2022 0.6  0.3 - 1.2 mg/dL Final   GFR, Estimated 04/26/2022 >60  >60 mL/min Final   Comment: (NOTE) Calculated using the CKD-EPI Creatinine Equation (2021)    Anion gap 04/26/2022 6  5 - 15 Final   Performed at [facility], [address] [place], [place] [address]   Alcohol, Ethyl (B) 04/26/2022 <10  <10 mg/dL Final   Comment: (NOTE) Lowest detectable limit for serum alcohol is 10 mg/dL.  For medical  purposes only. Performed at [facility], [address] [place], [place] [address]    Opiates 04/26/2022 NONE DETECTED  NONE DETECTED Final   Cocaine 04/26/2022 NONE DETECTED  NONE DETECTED Final   Benzodiazepines 04/26/2022 NONE DETECTED  NONE DETECTED Final   Amphetamines 04/26/2022 POSITIVE (A)  NONE DETECTED Final   Tetrahydrocannabinol 04/26/2022 POSITIVE (A)  NONE DETECTED Final   Barbiturates 04/26/2022 NONE DETECTED  NONE DETECTED Final   Comment: (NOTE) DRUG SCREEN FOR MEDICAL PURPOSES ONLY.  IF CONFIRMATION IS NEEDED FOR ANY PURPOSE, NOTIFY LAB WITHIN 5 DAYS.  LOWEST DETECTABLE LIMITS FOR URINE DRUG SCREEN Drug Class                     Cutoff (ng/mL) Amphetamine and metabolites    1000 Barbiturate and metabolites    200 Benzodiazepine                 200 Opiates and metabolites        300 Cocaine and metabolites        300 THC                            50 Performed at [facility], [address] [place], [place] [id number]     Blood Alcohol level:  Lab Results  Component Value Date   [facility]10 06/03/2022   ETH <10 04/26/2022    Metabolic Disorder Labs: Lab Results  Component Value Date   HGBA1C 4.9 07/04/2022   MPG 93.93 07/04/2022   MPG 93.93 11/10/2021   No results found for: "PROLACTIN" Lab Results  Component Value Date   CHOL 150 07/04/2022   TRIG 106 07/04/2022   HDL 50 07/04/2022   CHOLHDL 3.0 07/04/2022   VLDL 21 07/04/2022   LDLCALC 79 07/04/2022   LDLCALC 55 11/10/2021    Therapeutic Lab Levels: Lab Results  Component Value Date   LITHIUM 0.49 (L) 07/04/2022   Lab Results  Component Value Date  VALPROATE 52 07/04/2022   No results found for: "CBMZ"  Physical Findings   AIMS    Flowsheet Row [facility] Ad[facility]) [facility]  AIMS Total Score 0 0       AUDIT    Flowsheet Row [facility]) [facility] Ad[facility] (Dis[facility]  Alcohol Use Disorder Identification Test Final Score (AUDIT) 0 0      PHQ2-9    Flowsheet Row ED from 11/05/2021 in [facility] ED from 11/04/2021 in [facility] Department at [facility] ED from 11/03/2021 in [facility] Emergency Department at [facility]  PHQ-2 Total Score 4 6 6   PHQ-9 Total Score 25 24 25       Flowsheet Row ED from 07/04/2022 in [facility] ED from 06/03/2022 in [facility] Emergency Department at [facility] ED from 04/26/2022 in [facility] Emergency Department at [facility]  C-SSRS RISK CATEGORY High Risk High Risk High Risk        Musculoskeletal  Strength & Muscle Tone: within normal limits Gait & Station: normal Patient leans: N/A  Psychiatric Specialty Exam  Presentation  General Appearance:  Appropriate for Environment; Well Groomed  Eye Contact: Fair  Speech: Clear and Coherent; Normal Rate  Speech Volume: Normal  Handedness: Right   Mood and Affect  Mood: Hopeless; Depressed  Affect: Appropriate; Congruent; Full Range   Thought Process  Thought Processes: Coherent; Goal Directed  Descriptions of Associations:Intact  Orientation:Full (Time, Place and Person)  Thought Content:Paranoid Ideation; Delusions  Diagnosis of Schizophrenia or Schizoaffective disorder in past: Yes  Duration of Psychotic Symptoms: NA  Hallucinations:Denied AVH, paranoia. Did not appear to be responding internally. Ideas of Reference:Paranoia  Suicidal Thoughts:Denied Homicidal Thoughts:Denied  Sensorium  Memory: Recent Good  Judgment: Good  Insight: Good  Executive Functions  Concentration: Fair  Attention  Span: Good  Recall: Fair  Fund of Knowledge: Fair  Language: Good   Psychomotor Activity  Psychomotor Activity: Psychomotor Activity: Normal; Psychomotor Retardation   Assets  Assets: Communication Skills; Desire for Improvement; Physical Health; Social Support; Talents/Skills   Sleep  Sleep: Fair  Nutritional Assessment (For OBS and FBC admissions only) Has the patient had a weight loss or gain of 10 pounds or more in the last 3 months?: No Has the patient had a decrease in food intake/or appetite?: No Does the patient have dental problems?: No Does the patient have eating habits or behaviors that may be indicators of an eating disorder including binging or inducing vomiting?: No Has the patient been eating poorly because of a decreased appetite?: 0    Physical Exam  Physical Exam Constitutional:      General: He is not in acute distress.    Appearance: Normal appearance. He is not ill-appearing, toxic-appearing or diaphoretic.  HENT:     Head: Normocephalic and atraumatic.  Musculoskeletal:     Cervical back: Normal range of motion.  Neurological:     General: No focal deficit present.     Mental Status: He is alert and oriented to person, place, and time.   Review of Systems  Constitutional: Negative.   HENT: Negative.    Eyes: Negative.   Respiratory: Negative.    Cardiovascular: Negative.   Gastrointestinal: Negative.   Genitourinary: Negative.  Musculoskeletal: Negative.   Neurological:  Positive for dizziness.  Endo/Heme/Allergies: Negative.    Blood pressure 104/63, pulse 76, temperature 98.6 F (37 C), temperature source Oral, resp. rate 16, SpO2 97 %. There is no height or weight on file to calculate BMI.  Treatment Plan Summary: Plan DC back to [facility], Student-PA 07/05/2022 11:58 AM _______________________________________________________  I was present for the entirety of the evaluation on 07/05/2022. I reviewed the  patient's chart, and I participated in key portions of the service. I discussed the case with the PA student, and I agree with the assessment and plan of care as documented in the PA student's note. Case was discussed with attending.  Jabriel Vanduyne is a 27 y.o. male with PMH of SCzA v SIPD, stimulant use d/o (meth), MDD, PTSD, inpatient psych admission, who presented voluntary to [facility] (07/04/2022) from [facility] with [facility] staff Ms. Shanon Wonton [phone] for SI in the setting argument with another patient at [facility].   Patient is A&Ox4, with linear and logical thought process. He is pleasant, engaged, and cooperative with evaluation. He was able to give a coherent history. Patient is not suicidal, homicidal, psychotic or manic.   Per patient, he was at [facility] for ~42mo, then transferred directly to [facility] at [place], which he was there for ~4days. Patient reported that he got frustrated at a patient and said that he wanted to die, which prompted staff to send him to [facility] for re-assessment. Patient reported that he is able to return to [facility] once psychiatrically stable.  Attempted to get in contact with [facility] multiple times to see what the reason for [facility] admission was and to confirm patient's narrative. HIPAA compliant voicemail was left x4. Patient is psychiatricaly stable to DC back to [facility].   Princess Bruins, DO PGY-2

## 2022-07-05 NOTE — ED Notes (Signed)
[facility] was called to inform that [name] was medically cleared.  I was told by Cleotis Lema MT that the pt  needs to be in a facility for "seven days as an in-patient" "for stabilization" as per their director Rosette Reveal

## 2022-07-05 NOTE — ED Notes (Signed)
Pt alert at this hour. No apparent distress. RR even and unlabored. Pt expresses some anxiety and depression today. He knows he cannot return to [facility] for 7 days. Pt expresses desire to obtain a bed at [facility] or [facility]. "I was recently at [facility]. I want to give [facility] a try if possible." Pt given PRNs for anxiety. Monitored for safety.

## 2022-07-05 NOTE — ED Notes (Signed)
Patient taking a shower.

## 2022-07-05 NOTE — ED Notes (Signed)
Pt is sitting up in bed and has been given breakfast.  Pt has blunted flat affect but good eye contact. Pt reports that SI thoughts " come and go".  Pt unable to give a plan at this time. Also states that [facility] " are there sometimes".  Staff will continue to monitor for safety.

## 2022-07-06 LAB — T3, FREE: T3, Free: 3.6 pg/mL (ref 2.0–4.4)

## 2022-07-06 NOTE — ED Notes (Signed)
Pt resting quietly.  Breathing even and unlabored. No distress noted.   

## 2022-07-06 NOTE — Care Management (Signed)
OBS Care Management   Per the Director at [facility] the patient is not able to come back to the facility because they feel that the patient needs a higher level of care.  Writer contacted [facility] and spoke to the [employer] Mr. Darden Dates.  Per Mr. Tee the patient was at his facility in the past and due to his mental health issues he also was discharged and required a higher level of care.   The patient reports that he his a registered sex offender and is not able to go to most homeless shelters or rescue missions.   Writer left a Dance movement psychotherapist for the patient parole officer [place] [phone]).   Writer contacted [place] [phone]) and spoke to [employer].  Writer coordinated a telephone intake interview in order for the patient to be placed at their facility.    Per Minister Elray Mcgregor, the patient is accepted to their program pending the confirmation from his probation officer Dorothyann Peng) the patient's probation can be transferred to [facility] in [place].   Patient reports that if he is able to get his contact numbers out of his phone he feels that he would be able to find some friends that would allow him to stay with them.   Writer informed the NP, Almyra Free working with the patient.

## 2022-07-06 NOTE — ED Notes (Signed)
Patient resting quietly in bed and watching TV. Respirations even and unlabored. No acute distress noted. Will continue to monitor for safety.

## 2022-07-06 NOTE — ED Provider Notes (Shared)
Behavioral Health Progress Note  Date and Time: 07/06/2022 10:21 AM Name: Jon RipaJamie Clayton MRN:  [id number]  Subjective:  Jon RipaJamie Clayton is a 27 y.o. male with PMH of SCzA v SIPD, stimulant use d/o (meth), MDD, PTSD, inpatient psych admission, who [facility] Ms. Shanon Wonton [phone] for SI in the setting argument [facility].    Patient is alert and oriented x4, and appears calm, and cooperative when responding to staff.   Thought and speech is linear, logical, coherent, organized.    Evaluation on unit: Patient reports feeling fatigued, but otherwise good. He reports waking up through the night without any specific reason. Patient states he is in a depressed mood today, with symptoms being 6 out of 10 on a scale of 0-10.  His current medications are lithium, valproate, and Olanzapine. Patient has been compliant with rx and denies any side effects, except some initial mild dizziness and feeling bloated. Patient states he is sleeping fair and appetite is good. He denies any current SI or HI. Denies AVH. Denies paranoia. Did not appear internally/externally preoccupied.     Diagnosis:  Final diagnoses:  Severe auditory hallucinations  Suicidal ideations  Homicidal ideations  Tactile hallucinations    Total Time spent with patient: 30 minutes  Additional Social History:    Pain Medications: See MAR Prescriptions: See MAR Over the Counter: See MAR History of alcohol / drug use?: Yes Longest period of sobriety (when/how long): Had two years of being sober tehn relapsed for 4 months.  He is currently 33 days sober. Negative Consequences of Use: Personal relationships, Financial Withdrawal Symptoms: None  Sleep: Fair  Appetite:  Good  Current Medications:  Current Facility-Administered Medications  Medication Dose Route Frequency Provider Last Rate Last Admin   acetaminophen  (TYLENOL) tablet 650 mg  650 mg Oral Q6H PRN Onuoha, Chinwendu V, NP   650 mg at 07/04/22 2246   alum & mag hydroxide-simeth (MAALOX/MYLANTA) 200-200-20 MG/5ML suspension 30 mL  30 mL Oral Q4H PRN Onuoha, Chinwendu V, NP       divalproex (DEPAKOTE) DR tablet 500 mg  500 mg Oral Q12H Onuoha, Chinwendu V, NP   500 mg at 07/06/22 [id number]   hydrOXYzine (ATARAX) tablet 25 mg  25 mg Oral TID PRN Onuoha, Chinwendu V, NP   25 mg at 07/05/22 1949   lithium carbonate (LITHOBID) ER tablet 300 mg  300 mg Oral TID Onuoha, Chinwendu V, NP   300 mg at 07/06/22 [id number]   ziprasidone (GEODON) injection 20 mg  20 mg Intramuscular Q12H PRN Onuoha, Chinwendu V, NP       And   LORazepam (ATIVAN) tablet 1 mg  1 mg Oral PRN Onuoha, Chinwendu V, NP       magnesium hydroxide (MILK OF MAGNESIA) suspension 30 mL  30 mL Oral Daily PRN Onuoha, Chinwendu V, NP       OLANZapine (ZYPREXA) tablet 7.5 mg  7.5 mg Oral QHS Onuoha, Chinwendu V, NP   7.5 mg at 07/05/22 2116   traZODone (DESYREL) tablet 50 mg  50 mg Oral QHS PRN Onuoha, Chinwendu V, NP   50 mg at 07/05/22 2117   Current Outpatient Medications  Medication Sig Dispense Refill   albuterol (VENTOLIN HFA) 108 (90 Base) MCG/ACT inhaler Inhale 2 puffs into the lungs every 6 (six) hours as needed for wheezing or shortness of breath.     divalproex (DEPAKOTE) 500 MG DR tablet Take  500 mg by mouth 2 (two) times daily.     hydrOXYzine (VISTARIL) 25 MG capsule Take 25 mg by mouth every 6 (six) hours as needed for anxiety.     lithium carbonate (LITHOBID) 300 MG ER tablet Take 300 mg by mouth 3 (three) times daily.     OLANZapine (ZYPREXA) 15 MG tablet Take 15 mg by mouth at bedtime.     OLANZapine (ZYPREXA) 5 MG tablet Take 5 mg by mouth daily.      Labs  Lab Results:  Admission on 07/04/2022  Component Date Value Ref Range Status   SARS Coronavirus 2 by RT PCR 07/04/2022 NEGATIVE  NEGATIVE Final   Comment: (NOTE) SARS-CoV-2 target nucleic acids are NOT DETECTED.  The  SARS-CoV-2 RNA is generally detectable in upper respiratory specimens during the acute phase of infection. The lowest concentration of SARS-CoV-2 viral copies this assay can detect is 138 copies/mL. A negative result does not preclude SARS-Cov-2 infection and should not be used as the sole basis for treatment or other patient management decisions. A negative result may occur with  improper specimen collection/handling, submission of specimen other than nasopharyngeal swab, presence of viral mutation(s) within the areas targeted by this assay, and inadequate number of viral copies(<138 copies/mL). A negative result must be combined with clinical observations, patient history, and epidemiological information. The expected result is Negative.  Fact Sheet for Patients:  BloggerCourse.comhttps://www.fda.gov/media/152166/download  Fact Sheet for Healthcare Providers:  SeriousBroker.ithttps://www.fda.gov/media/152162/download  This test is no                          t yet approved or cleared by the [place] FDA and  has been authorized for detection and/or diagnosis of SARS-CoV-2 by FDA under an Emergency Use Authorization (EUA). This EUA will remain  in effect (meaning this test can be used) for the duration of the COVID-19 declaration under Section 564(b)(1) of the Act, 21 U.S.C.section 360bbb-3(b)(1), unless the authorization is terminated  or revoked sooner.       Influenza A by PCR 07/04/2022 NEGATIVE  NEGATIVE Final   Influenza B by PCR 07/04/2022 NEGATIVE  NEGATIVE Final   Comment: (NOTE) The Xpert Xpress SARS-CoV-2/FLU/RSV plus assay is intended as an aid in the diagnosis of influenza from Nasopharyngeal swab specimens and should not be used as a sole basis for treatment. Nasal washings and aspirates are unacceptable for Xpert Xpress SARS-CoV-2/FLU/RSV testing.  Fact Sheet for Patients: BloggerCourse.comhttps://www.fda.gov/media/152166/download  Fact Sheet for Healthcare  Providers: SeriousBroker.ithttps://www.fda.gov/media/152162/download  This test is not yet approved or cleared by the [place] FDA and has been authorized for detection and/or diagnosis of SARS-CoV-2 by FDA under an Emergency Use Authorization (EUA). This EUA will remain in effect (meaning this test can be used) for the duration of the COVID-19 declaration under Section 564(b)(1) of the Act, 21 U.S.C. section 360bbb-3(b)(1), unless the authorization is terminated or revoked.     Resp Syncytial Virus by PCR 07/04/2022 NEGATIVE  NEGATIVE Final   Comment: (NOTE) Fact Sheet for Patients: BloggerCourse.comhttps://www.fda.gov/media/152166/download  Fact Sheet for Healthcare Providers: SeriousBroker.ithttps://www.fda.gov/media/152162/download  This test is not yet approved or cleared by the [place] FDA and has been authorized for detection and/or diagnosis of SARS-CoV-2 by FDA under an Emergency Use Authorization (EUA). This EUA will remain in effect (meaning this test can be used) for the duration of the COVID-19 declaration under Section 564(b)(1) of the Act, 21 U.S.C. section 360bbb-3(b)(1), unless the authorization is terminated or revoked.  Performed at [facility], [place] [address] [place], [place] [address]    WBC 07/04/2022 10.2  4.0 - 10.5 K/uL Final   RBC 07/04/2022 4.66  4.22 - 5.81 MIL/uL Final   Hemoglobin 07/04/2022 13.8  13.0 - 17.0 g/dL Final   HCT 07/04/2022 41.0  39.0 - 52.0 % Final   MCV 07/04/2022 88.0  80.0 - 100.0 fL Final   MCH 07/04/2022 29.6  26.0 - 34.0 pg Final   MCHC 07/04/2022 33.7  30.0 - 36.0 g/dL Final   RDW 07/04/2022 14.0  11.5 - 15.5 % Final   Platelets 07/04/2022 259  150 - 400 K/uL Final   nRBC 07/04/2022 0.0  0.0 - 0.2 % Final   Neutrophils Relative % 07/04/2022 57  % Final   Neutro Abs 07/04/2022 5.9  1.7 - 7.7 K/uL Final   Lymphocytes Relative 07/04/2022 31  % Final   Lymphs Abs 07/04/2022 3.2  0.7 - 4.0 K/uL Final   Monocytes Relative 07/04/2022 8  % Final   Monocytes  Absolute 07/04/2022 0.8  0.1 - 1.0 K/uL Final   Eosinophils Relative 07/04/2022 2  % Final   Eosinophils Absolute 07/04/2022 0.2  0.0 - 0.5 K/uL Final   Basophils Relative 07/04/2022 1  % Final   Basophils Absolute 07/04/2022 0.1  0.0 - 0.1 K/uL Final   Immature Granulocytes 07/04/2022 1  % Final   Abs Immature Granulocytes 07/04/2022 0.11 (H)  0.00 - 0.07 K/uL Final   Performed at [facility], [place] [address], [place], [place] [address]   Sodium 07/04/2022 139  135 - 145 mmol/L Final   Potassium 07/04/2022 3.4 (L)  3.5 - 5.1 mmol/L Final   Chloride 07/04/2022 102  98 - 111 mmol/L Final   CO2 07/04/2022 30  22 - 32 mmol/L Final   Glucose, Bld 07/04/2022 71  70 - 99 mg/dL Final   Glucose reference range applies only to samples taken after fasting for at least 8 hours.   BUN 07/04/2022 12  6 - 20 mg/dL Final   Creatinine, Ser 07/04/2022 0.95  0.61 - 1.24 mg/dL Final   Calcium 07/04/2022 9.0  8.9 - 10.3 mg/dL Final   Total Protein 07/04/2022 6.7  6.5 - 8.1 g/dL Final   Albumin 07/04/2022 4.2  3.5 - 5.0 g/dL Final   AST 07/04/2022 21  15 - 41 U/L Final   ALT 07/04/2022 17  0 - 44 U/L Final   Alkaline Phosphatase 07/04/2022 44  38 - 126 U/L Final   Total Bilirubin 07/04/2022 0.3  0.3 - 1.2 mg/dL Final   GFR, Estimated 07/04/2022 >60  >60 mL/min Final   Comment: (NOTE) Calculated using the CKD-EPI Creatinine Equation (2021)    Anion gap 07/04/2022 7  5 - 15 Final   Performed at [place] [address], [place], [place] [address]   Hgb A1c MFr Bld 07/04/2022 4.9  4.8 - 5.6 % Final   Comment: (NOTE) Pre diabetes:          5.7%-6.4%  Diabetes:              >6.4%  Glycemic control for   <7.0% adults with diabetes    Mean Plasma Glucose 07/04/2022 93.93  mg/dL Final   Performed at [facility], [place] [address], [place],  [address]   POC Amphetamine UR 07/04/2022 None Detected  NONE DETECTED (Cut Off Level 1000 ng/mL) Final   POC Secobarbital (BAR) 07/04/2022  None Detected  NONE DETECTED (Cut Off Level 300 ng/mL) Final   POC Buprenorphine (BUP) 07/04/2022 None Detected  NONE DETECTED (Cut Off Level 10 ng/mL) Final   POC Oxazepam (BZO) 07/04/2022 None Detected  NONE DETECTED (Cut Off Level 300 ng/mL) Final   POC Cocaine UR 07/04/2022 None Detected  NONE DETECTED (Cut Off Level 300 ng/mL) Final   POC Methamphetamine UR 07/04/2022 None Detected  NONE DETECTED (Cut Off Level 1000 ng/mL) Final   POC Morphine 07/04/2022 None Detected  NONE DETECTED (Cut Off Level 300 ng/mL) Final   POC Methadone UR 07/04/2022 None Detected  NONE DETECTED (Cut Off Level 300 ng/mL) Final   POC Oxycodone UR 07/04/2022 None Detected  NONE DETECTED (Cut Off Level 100 ng/mL) Final   POC Marijuana UR 07/04/2022 None Detected  NONE DETECTED (Cut Off Level 50 ng/mL) Final   Lithium Lvl 07/04/2022 0.49 (L)  0.60 - 1.20 mmol/L Final   Performed at [facility], [place] [address] [place], [place] [address]   Valproic Acid Lvl 07/04/2022 52  50.0 - 100.0 ug/mL Final   Performed at [place] [address] [place], [place] [address]   SARSCOV2ONAVIRUS 2 AG 07/04/2022 NEGATIVE  NEGATIVE Final   Comment: (NOTE) SARS-CoV-2 antigen NOT DETECTED.   Negative results are presumptive.  Negative results do not preclude SARS-CoV-2 infection and should not be used as the sole basis for treatment or other patient management decisions, including infection  control decisions, particularly in the presence of clinical signs and  symptoms consistent with COVID-19, or in those who have been in contact with the virus.  Negative results must be combined with clinical observations, patient history, and epidemiological information. The expected result is Negative.  Fact Sheet for Patients: HandmadeRecipes.com.cy  Fact Sheet for Healthcare Providers: FuneralLife.at  This test is not yet approved or cleared by the [place] FDA and   has been authorized for detection and/or diagnosis of SARS-CoV-2 by FDA under an Emergency Use Authorization (EUA).  This EUA will remain in effect (meaning this test can be used) for the duration of  the COV                          ID-19 declaration under Section 564(b)(1) of the Act, 21 U.S.C. section 360bbb-3(b)(1), unless the authorization is terminated or revoked sooner.     Cholesterol 07/04/2022 150  0 - 200 mg/dL Final   Triglycerides 07/04/2022 106  <150 mg/dL Final   HDL 07/04/2022 50  >40 mg/dL Final   Total CHOL/HDL Ratio 07/04/2022 3.0  RATIO Final   VLDL 07/04/2022 21  0 - 40 mg/dL Final   LDL Cholesterol 07/04/2022 79  0 - 99 mg/dL Final   Comment:        Total Cholesterol/HDL:CHD Risk Coronary Heart Disease Risk Table                     Men   Women  1/2 Average Risk   3.4   3.3  Average Risk       5.0   4.4  2 X Average Risk   9.6   7.1  3 X Average Risk  23.4   11.0        Use the calculated Patient Ratio above and the CHD Risk Table to determine the patient's CHD Risk.        ATP III CLASSIFICATION (LDL):  <100  mg/dL   Optimal  197-588  mg/dL   Near or Above                    Optimal  130-159  mg/dL   Borderline  325-498  mg/dL   High  >264     mg/dL   Very High Performed at [facility], [address] [place], [place] [address]    TSH 07/04/2022 5.790 (H)  0.350 - 4.500 uIU/mL Final   Comment: Performed by a 3rd Generation assay with a functional sensitivity of <=0.01 uIU/mL. Performed at [facility], [address], [place], [place] [address]    T3, Free 07/04/2022 3.6  2.0 - 4.4 pg/mL Final   Comment: (NOTE) Performed At: [facility] [address] [place], [place] [id number] Jolene Schimke MD RX:[id number]    Free T4 07/04/2022 0.65  0.61 - 1.12 ng/dL Final   Comment: (NOTE) Biotin ingestion may interfere with free T4 tests. If the results are inconsistent with the TSH level, previous test results, or  the clinical presentation, then consider biotin interference. If needed, order repeat testing after stopping biotin. Performed at [facility], [address], [place], [place] [address]   Admission on 06/03/2022, Discharged on 06/07/2022  Component Date Value Ref Range Status   Sodium 06/03/2022 137  135 - 145 mmol/L Final   Potassium 06/03/2022 3.5  3.5 - 5.1 mmol/L Final   Chloride 06/03/2022 107  98 - 111 mmol/L Final   CO2 06/03/2022 22  22 - 32 mmol/L Final   Glucose, Bld 06/03/2022 99  70 - 99 mg/dL Final   Glucose reference range applies only to samples taken after fasting for at least 8 hours.   BUN 06/03/2022 7  6 - 20 mg/dL Final   Creatinine, Ser 06/03/2022 0.93  0.61 - 1.24 mg/dL Final   Calcium 81/77/1165 9.1  8.9 - 10.3 mg/dL Final   Total Protein 79/08/8331 7.4  6.5 - 8.1 g/dL Final   Albumin 83/29/1916 4.4  3.5 - 5.0 g/dL Final   AST 60/60/0459 19  15 - 41 U/L Final   ALT 06/03/2022 13  0 - 44 U/L Final   Alkaline Phosphatase 06/03/2022 57  38 - 126 U/L Final   Total Bilirubin 06/03/2022 0.9  0.3 - 1.2 mg/dL Final   GFR, Estimated 06/03/2022 >60  >60 mL/min Final   Comment: (NOTE) Calculated using the CKD-EPI Creatinine Equation (2021)    Anion gap 06/03/2022 8  5 - 15 Final   Performed at [facility], [address] [place], [place] [address]   Alcohol, Ethyl (B) 06/03/2022 <10  <10 mg/dL Final   Comment: (NOTE) Lowest detectable limit for serum alcohol is 10 mg/dL.  For medical purposes only. Performed at [facility], [address] [place], [place] [address]    Salicylate Lvl 06/03/2022 <7.0 (L)  7.0 - 30.0 mg/dL Final   Performed at [facility], [address] [place], [place] [address]   Acetaminophen (Tylenol), Serum 06/03/2022 <10 (L)  10 - 30 ug/mL Final   Comment: (NOTE) Therapeutic concentrations vary significantly. A range of 10-30 ug/mL  may be an effective concentration for many patients. However,  some  are best treated at concentrations outside of this range. Acetaminophen concentrations >150 ug/mL at 4 hours after ingestion  and >50 ug/mL at 12 hours after ingestion are often associated with  toxic reactions.  Performed at [facility], [address] [place], [place] [address]    WBC 06/03/2022 8.5  4.0 - 10.5 K/uL Final   RBC 06/03/2022 5.11  4.22 - 5.81 MIL/uL Final   Hemoglobin 06/03/2022 14.8  13.0 - 17.0 g/dL Final   HCT 03/50/0938 43.8  39.0 - 52.0 % Final   MCV 06/03/2022 85.7  80.0 - 100.0 fL Final   MCH 06/03/2022 29.0  26.0 - 34.0 pg Final   MCHC 06/03/2022 33.8  30.0 - 36.0 g/dL Final   RDW 18/29/9371 14.0  11.5 - 15.5 % Final   Platelets 06/03/2022 249  150 - 400 K/uL Final   nRBC 06/03/2022 0.0  0.0 - 0.2 % Final   Performed at [facility], [address] [place], [place] [address]   Opiates 06/03/2022 NONE DETECTED  NONE DETECTED Final   Cocaine 06/03/2022 NONE DETECTED  NONE DETECTED Final   Benzodiazepines 06/03/2022 NONE DETECTED  NONE DETECTED Final   Amphetamines 06/03/2022 POSITIVE (A)  NONE DETECTED Final   Tetrahydrocannabinol 06/03/2022 POSITIVE (A)  NONE DETECTED Final   Barbiturates 06/03/2022 NONE DETECTED  NONE DETECTED Final   Comment: (NOTE) DRUG SCREEN FOR MEDICAL PURPOSES ONLY.  IF CONFIRMATION IS NEEDED FOR ANY PURPOSE, NOTIFY LAB WITHIN 5 DAYS.  LOWEST DETECTABLE LIMITS FOR URINE DRUG SCREEN Drug Class                     Cutoff (ng/mL) Amphetamine and metabolites    1000 Barbiturate and metabolites    200 Benzodiazepine                 200 Opiates and metabolites        300 Cocaine and metabolites        300 THC                            50 Performed at [facility], [address] [place], [place] [address]    SARS Coronavirus 2 by RT PCR 06/03/2022 NEGATIVE  NEGATIVE Final   Comment: (NOTE) SARS-CoV-2 target nucleic acids are NOT DETECTED.  The SARS-CoV-2 RNA is generally detectable in  upper respiratory specimens during the acute phase of infection. The lowest concentration of SARS-CoV-2 viral copies this assay can detect is 138 copies/mL. A negative result does not preclude SARS-Cov-2 infection and should not be used as the sole basis for treatment or other patient management decisions. A negative result may occur with  improper specimen collection/handling, submission of specimen other than nasopharyngeal swab, presence of viral mutation(s) within the areas targeted by this assay, and inadequate number of viral copies(<138 copies/mL). A negative result must be combined with clinical observations, patient history, and epidemiological information. The expected result is Negative.  Fact Sheet for Patients:  BloggerCourse.com  Fact Sheet for Healthcare Providers:  SeriousBroker.it  This test is no                          t yet approved or cleared by the [place] FDA and  has been authorized for detection and/or diagnosis of SARS-CoV-2 by FDA under an Emergency Use Authorization (EUA). This EUA will remain  in effect (meaning this test can be used) for the duration of the COVID-19 declaration under Section 564(b)(1) of the Act, 21 U.S.C.section 360bbb-3(b)(1), unless the authorization is terminated  or revoked sooner.  Influenza A by PCR 06/03/2022 NEGATIVE  NEGATIVE Final   Influenza B by PCR 06/03/2022 NEGATIVE  NEGATIVE Final   Comment: (NOTE) The Xpert Xpress SARS-CoV-2/FLU/RSV plus assay is intended as an aid in the diagnosis of influenza from Nasopharyngeal swab specimens and should not be used as a sole basis for treatment. Nasal washings and aspirates are unacceptable for Xpert Xpress SARS-CoV-2/FLU/RSV testing.  Fact Sheet for Patients: BloggerCourse.comhttps://www.fda.gov/media/152166/download  Fact Sheet for Healthcare Providers: SeriousBroker.ithttps://www.fda.gov/media/152162/download  This test is not yet approved  or cleared by the [place] FDA and has been authorized for detection and/or diagnosis of SARS-CoV-2 by FDA under an Emergency Use Authorization (EUA). This EUA will remain in effect (meaning this test can be used) for the duration of the COVID-19 declaration under Section 564(b)(1) of the Act, 21 U.S.C. section 360bbb-3(b)(1), unless the authorization is terminated or revoked.     Resp Syncytial Virus by PCR 06/03/2022 NEGATIVE  NEGATIVE Final   Comment: (NOTE) Fact Sheet for Patients: BloggerCourse.comhttps://www.fda.gov/media/152166/download  Fact Sheet for Healthcare Providers: SeriousBroker.ithttps://www.fda.gov/media/152162/download  This test is not yet approved or cleared by the [place] FDA and has been authorized for detection and/or diagnosis of SARS-CoV-2 by FDA under an Emergency Use Authorization (EUA). This EUA will remain in effect (meaning this test can be used) for the duration of the COVID-19 declaration under Section 564(b)(1) of the Act, 21 U.S.C. section 360bbb-3(b)(1), unless the authorization is terminated or revoked.  Performed at [facility], [address] [place], [place] [address]   Admission on 04/26/2022, Discharged on 04/27/2022  Component Date Value Ref Range Status   WBC 04/26/2022 9.3  4.0 - 10.5 K/uL Final   RBC 04/26/2022 5.25  4.22 - 5.81 MIL/uL Final   Hemoglobin 04/26/2022 15.4  13.0 - 17.0 g/dL Final   HCT 19/14/782911/14/2023 46.4  39.0 - 52.0 % Final   MCV 04/26/2022 88.4  80.0 - 100.0 fL Final   MCH 04/26/2022 29.3  26.0 - 34.0 pg Final   MCHC 04/26/2022 33.2  30.0 - 36.0 g/dL Final   RDW 56/21/308611/14/2023 14.0  11.5 - 15.5 % Final   Platelets 04/26/2022 250  150 - 400 K/uL Final   nRBC 04/26/2022 0.0  0.0 - 0.2 % Final   Neutrophils Relative % 04/26/2022 47  % Final   Neutro Abs 04/26/2022 4.3  1.7 - 7.7 K/uL Final   Lymphocytes Relative 04/26/2022 46  % Final   Lymphs Abs 04/26/2022 4.3 (H)  0.7 - 4.0 K/uL Final   Monocytes Relative 04/26/2022 6  % Final    Monocytes Absolute 04/26/2022 0.5  0.1 - 1.0 K/uL Final   Eosinophils Relative 04/26/2022 1  % Final   Eosinophils Absolute 04/26/2022 0.1  0.0 - 0.5 K/uL Final   Basophils Relative 04/26/2022 0  % Final   Basophils Absolute 04/26/2022 0.0  0.0 - 0.1 K/uL Final   Immature Granulocytes 04/26/2022 0  % Final   Abs Immature Granulocytes 04/26/2022 0.02  0.00 - 0.07 K/uL Final   Performed at [facility], [address] [place], [place] [address]   Sodium 04/26/2022 140  135 - 145 mmol/L Final   Potassium 04/26/2022 3.8  3.5 - 5.1 mmol/L Final   Chloride 04/26/2022 105  98 - 111 mmol/L Final   CO2 04/26/2022 29  22 - 32 mmol/L Final   Glucose, Bld 04/26/2022 86  70 - 99 mg/dL Final   Glucose reference range applies only to samples taken after fasting for  at least 8 hours.   BUN 04/26/2022 10  6 - 20 mg/dL Final   Creatinine, Ser 04/26/2022 1.02  0.61 - 1.24 mg/dL Final   Calcium 20/25/4270 9.5  8.9 - 10.3 mg/dL Final   Total Protein 62/37/6283 7.0  6.5 - 8.1 g/dL Final   Albumin 15/17/6160 4.5  3.5 - 5.0 g/dL Final   AST 73/71/0626 15  15 - 41 U/L Final   ALT 04/26/2022 14  0 - 44 U/L Final   Alkaline Phosphatase 04/26/2022 48  38 - 126 U/L Final   Total Bilirubin 04/26/2022 0.6  0.3 - 1.2 mg/dL Final   GFR, Estimated 04/26/2022 >60  >60 mL/min Final   Comment: (NOTE) Calculated using the CKD-EPI Creatinine Equation (2021)    Anion gap 04/26/2022 6  5 - 15 Final   Performed at [facility], [address] [place], [place] [address]   Alcohol, Ethyl (B) 04/26/2022 <10  <10 mg/dL Final   Comment: (NOTE) Lowest detectable limit for serum alcohol is 10 mg/dL.  For medical purposes only. Performed at [facility], [address], [place], [place] [address]    Opiates 04/26/2022 NONE DETECTED  NONE DETECTED Final   Cocaine 04/26/2022 NONE DETECTED  NONE DETECTED Final   Benzodiazepines 04/26/2022 NONE DETECTED  NONE DETECTED  Final   Amphetamines 04/26/2022 POSITIVE (A)  NONE DETECTED Final   Tetrahydrocannabinol 04/26/2022 POSITIVE (A)  NONE DETECTED Final   Barbiturates 04/26/2022 NONE DETECTED  NONE DETECTED Final   Comment: (NOTE) DRUG SCREEN FOR MEDICAL PURPOSES ONLY.  IF CONFIRMATION IS NEEDED FOR ANY PURPOSE, NOTIFY LAB WITHIN 5 DAYS.  LOWEST DETECTABLE LIMITS FOR URINE DRUG SCREEN Drug Class                     Cutoff (ng/mL) Amphetamine and metabolites    1000 Barbiturate and metabolites    200 Benzodiazepine                 200 Opiates and metabolites        300 Cocaine and metabolites        300 THC                            50 Performed at [facility], [address], [place], [place] [address]     Blood Alcohol level:  Lab Results  Component Value Date   [facility] <10 06/03/2022   ETH <10 04/26/2022    Metabolic Disorder Labs: Lab Results  Component Value Date   HGBA1C 4.9 07/04/2022   MPG 93.93 07/04/2022   MPG 93.93 11/10/2021   No results found for: "PROLACTIN" Lab Results  Component Value Date   CHOL 150 07/04/2022   TRIG 106 07/04/2022   HDL 50 07/04/2022   CHOLHDL 3.0 07/04/2022   VLDL 21 07/04/2022   LDLCALC 79 07/04/2022   LDLCALC 55 11/10/2021    Therapeutic Lab Levels: Lab Results  Component Value Date   LITHIUM 0.49 (L) 07/04/2022   Lab Results  Component Value Date   VALPROATE 52 07/04/2022   No results found for: "CBMZ"  Physical Findings   AIMS    Flowsheet Row [facility]) [facility] Admission (Discharged) [facility]  AIMS Total Score 0 0      AUDIT    Flowsheet Row Ad[facility] (Discharged) [facility] Admission (Discharged) [facility]  Alcohol Use Disorder Identification Test Final Score (AUDIT) 0 0       PHQ2-9    Flowsheet Row [facility]  PHQ-2 Total Score 4 6 6   PHQ-9 Total Score 25 24 25       Flowsheet Row ED [facility]  C-SSRS RISK CATEGORY High Risk High Risk High Risk        Musculoskeletal  Strength & Muscle Tone: within normal limits Gait & Station: normal Patient leans: N/A  Psychiatric Specialty Exam  Presentation  General Appearance:  Appropriate for Environment; Well Groomed  Eye Contact: Good  Speech: Clear and Coherent; Normal Rate  Speech Volume: Normal  Handedness: Right   Mood and Affect  Mood: Depressed  Affect: Appropriate; Congruent; Full Range   Thought Process  Thought Processes: Goal Directed  Descriptions of Associations:Intact  Orientation:Full (Time, Place and Person)  Thought Content:Logical  Diagnosis of Schizophrenia or Schizoaffective disorder in past: Yes    Hallucinations:Hallucinations: None  Ideas of Reference:None  Suicidal Thoughts:Suicidal Thoughts: Yes, Passive SI Active Intent and/or Plan: Without Intent; Without Plan  Homicidal Thoughts:Homicidal Thoughts: No HI Passive Intent and/or Plan: Without Intent; Without Plan   Sensorium  Memory: Immediate Good  Judgment: Good  Insight: Good   Executive Functions  Concentration: Good  Attention Span: Good  Recall: Good  Fund of Knowledge: Good  Language: Good   Psychomotor Activity  Psychomotor Activity: Psychomotor Activity: Psychomotor Retardation; Normal   Assets  Assets: Communication Skills; Desire for Improvement;  Physical Health; Social Support   Sleep  Sleep: Sleep: Fair Number of Hours of Sleep: 7   Nutritional Assessment (For OBS and FBC admissions only) Does the patient have dental problems?: No    Physical Exam  Physical Exam ROS Blood pressure 99/71, pulse 74, temperature 97.9 F (36.6 C), temperature source Oral, resp. rate 16, SpO2 98 %. There is no height or weight on file to calculate BMI.  Treatment Plan Summary: Patients remains psychiatric cleared. Working with  CSW Ava Elisabeth MostStevenson to [facility]. Patient does not meet requirement for inpatient psych.   Rejeana Brockatalie Giovannina Mun, Student-PA 07/06/2022 10:21 AM

## 2022-07-06 NOTE — ED Notes (Signed)
Pt was given a muffin and cereal for breakfast. ?

## 2022-07-06 NOTE — ED Notes (Signed)
Pt laying quietly in bed watching TV.  No distress noted. Staff will continue to monitor for safety.  

## 2022-07-06 NOTE — ED Notes (Signed)
Pt awake and alert. Watching TV with peer. Awaiting Dispo

## 2022-07-06 NOTE — Care Management (Signed)
OBS Care Management   Writer spoke with the Supervisor at [facility] Butch Penny [phone]) in order to coordinate a discharge for the patient back to [facility].   Per Butch Penny, she will have to speak to the Director of the facility arrangements for the patient can be returned back to the facilty.   Writer is awaiting a call back from [facility].

## 2022-07-06 NOTE — ED Notes (Signed)
Pt sleeping soundly. Breathing even and unlabored.  In view of nursing station. Staff will continue to monitor for safety.  

## 2022-07-06 NOTE — ED Notes (Signed)
Pt asleep at this hour. No apparent distress. RR even and unlabored. Monitored for safety.

## 2022-07-06 NOTE — ED Notes (Signed)
Pt is in the bed sleeping. Respirations are even and unlabored. No acute distress noted. Will continue to monitor for safety. 

## 2022-07-07 DIAGNOSIS — F1191 Opioid use, unspecified, in remission: Secondary | ICD-10-CM

## 2022-07-07 MED ORDER — DIVALPROEX SODIUM 500 MG PO DR TAB: 500.0000 mg | DELAYED_RELEASE_TABLET | Freq: Two times a day (BID) | ORAL | 0 refills | Status: DC

## 2022-07-07 MED ORDER — HYDROXYZINE PAMOATE 25 MG PO CAPS: 25.0000 mg | ORAL_CAPSULE | Freq: Four times a day (QID) | ORAL | 0 refills | Status: DC | PRN

## 2022-07-07 MED ORDER — ALBUTEROL SULFATE HFA 108 (90 BASE) MCG/ACT IN AERS: 2.0000 | INHALATION_SPRAY | Freq: Four times a day (QID) | RESPIRATORY_TRACT | 0 refills | Status: DC | PRN

## 2022-07-07 MED ORDER — OLANZAPINE 7.5 MG PO TABS: 7.5000 mg | ORAL_TABLET | Freq: Every day | ORAL | 0 refills | Status: DC

## 2022-07-07 MED ORDER — LITHIUM CARBONATE ER 300 MG PO TBCR: 300.0000 mg | EXTENDED_RELEASE_TABLET | Freq: Three times a day (TID) | ORAL | 0 refills | Status: DC

## 2022-07-07 NOTE — ED Notes (Signed)
Pt is in the bed sleeping. Respirations are even and unlabored. No acute distress noted. Will continue to monitor for safety. 

## 2022-07-07 NOTE — ED Notes (Signed)
Patient alert and oriented x 3.Endorses SI/HI denies AVH. States that he wants to harm himself and everyone around him. States that he would have harmed himself last night but states he did not have anything to harm himself with.Routine conducted according to faculty protocol. Encourage patient to notify staff with any needs or concerns. Patient verbalized agreement and understanding. Will continue to monitor for safety.

## 2022-07-07 NOTE — Care Management (Addendum)
OBS Care Management   Writer spoke to Mrs. Green who is the Publishing copy at [facility].  Per Mrs. Green they are able to pick up the patient today between 10am and 10:30am   Writer informed the NP working with the patient.

## 2022-07-07 NOTE — ED Provider Notes (Signed)
FBC/OBS ASAP Discharge Summary  Date and Time: 07/07/2022, 9:58 AM  Name: Jon Clayton  Age: 27 y.o.  DOB: January 24, 1996  MRN:  [id number]   Discharge Diagnoses:  Principal Problem:   Suicidal ideation Active Problems:   PTSD (post-traumatic stress disorder)   MDD (major depressive disorder), recurrent, severe, with psychosis (HCC)   Polysubstance use disorder   Cannabis use disorder   Personality disorder (HCC)   Adult antisocial behavior   Amphetamine-type substance use disorder, severe (HCC)   History of heroin use    HPI:  Per H&P: " HPI: Jon Clayton is a 27 year old male with psychiatric history significant for MDD, PTSD, polysubstance abuse, personality disorder, antisocial behavior, at high risk of self-harm who presented voluntarily to [facility] accompanied by [facility] staff  Ms Radonna Ricker [phone] with complaints of ongoing auditory hallucinations all weekend, SI, HI, violent thoughts, and paranoia.   Patient was seen face-to-face by this provider and chart reviewed. Patient is familiar to [facility] and have several ED visits for mental health illness as well as homelessness. He was admitted, treated, and discharged from [facility] in May 2023, and in June 2023. Then at [facility] in [place] in December. Patient states he was just medications at [facility] in [facility], and at currently at [facility] where he came from tonight.    On evaluation, patient is alert, oriented x 2, and cooperative. Speech is blocked. Pt appears disheveled. Eye contact is fair. Mood is anxious, affect is congruent with mood. Thought process is linear and circumstantial and thought content is wdl. Pt endorses active SI, passive HI,AVH, and paranoia There is no objective indication that the patient is responding to internal stimuli. There are delusions elicited during this assessment, as patient reported he believes people in the next room are always talking about him.   Patient reports "Ms Radonna Ricker brought me in, she's a  Stage manager, I'm mainly hearing voices, I also, like feel hands touching me, it's like, I feel like, people are out to get me, it's like, for example, conversations out there, I may not know what they say, but my brain makes it like they talking about me, to kill me, and I'm also having some SI, but I've had it most of my life, I'm having a few different plans, about 9 months ago, picked up by police standing in the middle of the highway, trying to get hit by a car, or poisoning myself with apple seeds, which is most recent, I had a whole bag saved up before I left, just threw them away".   Patient reports "sometimes I feel like I'm going to hurt someone, right now no, right before I came here yes.  Sometimes I have visual hallucinations, not as often as I hear things, but sometimes".   Patient reports "I was going to [facility] to find housing, healing, and hope, basically to recover from drug addiction, I've been 33 days sober, they said that because of the problems I'm dealing with, I need to come here and when I get better, I can come back there".  Patient reports he takes regular psychiatric medications, but is unable to recall the names of these medications.   Patient reports his sleep and appetite as poor.  Patient denies access to a gun or weapon.  Patient reports he is currently homeless, and is originally from [place], [place].  Patient reports it will be 2 years in July since he left home.   Support, encouragement, and reassurance  provided about ongoing stressors.  Patient provided with opportunity for questions.   Collateral information was obtained from [facility] staff, Ms Radonna Ricker, who brought the patient directly from the center.  She reports that she worked with the patient on the weekend and he was having auditory hallucinations, having thoughts of harming himself and others, having violent thoughts, and needing higher level of care.  She reports the patient sleeps through the night  with his meds, but during the day, not so much.  She reports that the patient was admitted at [facility] from [facility]. Ms Radonna Ricker provided a recent list of the patient's medications from [facility] and when he was admitted at [facility] in December.  Per Mar from [facility], the patient is currently taking hydroxyzine 25 mg p.o. every 6 hours as needed for anxiety, lithium carbonate 300 mg p.o. 3 times daily for mood and suicidal thoughts, Divalproex DR  500 mg 1 tab twice daily for bipolar, olanzapine 5 mg p.o. daily for bipolar and olanzapine 15 mg p.o. nightly for bipolar. "  Subjective:  Safety planning completed. Patient denied access to guns or weapons. Patient reported chronic passive SI, unchanged for months. Denied HI/AVH, paranoia, and contracted to safety. Aware of 988, 911, and ability to return to the [facility] if needed.   Suicidal Thoughts: Yes, Passive SI Passive Intent and/or Plan: Without Intent, Without Plan Homicidal Thoughts: No Hallucinations: None Description of Auditory Hallucinations: - Description of Visual Hallucinations: - Ideas of VZD:GLOV   Mood: Dysphoric Sleep:Good Appetite: Good  Review of Systems  Respiratory:  Negative for shortness of breath.   Cardiovascular:  Negative for chest pain.  Gastrointestinal:  Negative for nausea and vomiting.  Neurological:  Negative for dizziness and headaches.    Stay Summary:  Jon Clayton is a 27 y.o. male with PMH of SCzA v SIPD, stimulant use d/o (meth), MDD, PTSD, inpatient psych admission, h/o self-reported suicide attempts, who presented [facility] (07[facility]04/20[facility]) from [facility] with [facility] staff Ms. Shanon Wonton [phone] for SI in the setting argument with another patient at [facility].   Patient was admitted to [facility] from [facility] (~33mo stay). Then admitted door-to-door to [facility] (stayed for ~4days), then admitted door-to-door to [facility]. Patient was then D/C'd home with  case worker (Mrs. Chilton Si) with [facility] at [phone] Total duration of encounter: 3 days   PTA Rx:  divalproex, 500 mg, Q12H lithium carbonate, 300 mg, TID OLANZapine, 7.5 mg, QHS  Rx Change: None, continued home rx.  Behavioral concerns: No acute behavioral concerns, no agitation PRNs required. Adherent to rx.   Principal Problem:   Suicidal ideation Active Problems:   PTSD (post-traumatic stress disorder)   MDD (major depressive disorder), recurrent, severe, with psychosis (HCC)   Polysubstance use disorder   Cannabis use disorder   Personality disorder (HCC)   Adult antisocial behavior   Amphetamine-type substance use disorder, severe (HCC)   History of heroin use   Tobacco Cessation:  N/A, patient does not currently use tobacco products  Clinical Course as of 07/07/22 0958  Tue Jul 05, 2022  1032 Lipid panel wnl [JN]  1032 POCT Urine Drug Screen - (I-Screen) neg [JN]  1032 Hemoglobin A1C: 4.9 [JN]  1032 Lithium(!): 0.49 [JN]  1032 Valproic Acid,S: 52 [JN]  1033 CBC with Differential/Platelet(!) wnl [JN]  1033 T4,Free(Direct): 0.65 [JN]  1033 TSH(!): 5.790 [JN]  1033 Comprehensive metabolic panel(!) unremarkable [JN]    Clinical Course User Index [JN] Princess Bruins,  DO    All medication's risk, benefits, and side effects were discussed with patient prior to starting, dose changed or discontinuation.  While future psychiatric events cannot be accurately predicted, the patient does not currently require acute inpatient psychiatric care and does not currently meet [facility] involuntary commitment criteria.   Past Psychiatric History: Per H&P Past Medical History:  Past Medical History:  Diagnosis Date   Depression    Psychosis (HCC)    PTSD (post-traumatic stress disorder)     Past Surgical History:  Procedure Laterality Date   NASAL SINUS SURGERY Bilateral    TONSILLECTOMY     Family History:  No family history on file. Family  Psychiatric History: Per H&P Social History:  Social History   Substance and Sexual Activity  Alcohol Use Not Currently   Comment: reports periodic alcohol use     Social History   Substance and Sexual Activity  Drug Use Yes   Frequency: 1.0 times per week   Types: Marijuana   Comment: 1/3 gm daily    Social History   Socioeconomic History   Marital status: Single    Spouse name: Not on file   Number of children: Not on file   Years of education: Not on file   Highest education level: 8th grade  Occupational History   Not on file  Tobacco Use   Smoking status: Every Day    Packs/day: 1.00    Years: 11.00    Total pack years: 11.00    Types: Cigarettes   Smokeless tobacco: Never  Vaping Use   Vaping Use: Never used  Substance and Sexual Activity   Alcohol use: Not Currently    Comment: reports periodic alcohol use   Drug use: Yes    Frequency: 1.0 times per week    Types: Marijuana    Comment: 1/3 gm daily   Sexual activity: Not Currently  Other Topics Concern   Not on file  Social History Narrative   Not on file   Social Determinants of Health   Financial Resource Strain: Not on file  Food Insecurity: Not on file  Transportation Needs: Not on file  Physical Activity: Not on file  Stress: Not on file  Social Connections: Not on file   SDOH:  SDOH Screenings   Alcohol Screen: Low Risk  (11/25/2021)  Depression (PHQ2-9): High Risk (11/08/2021)  Tobacco Use: High Risk (06/03/2022)    Current Medications:  Current Facility-Administered Medications  Medication Dose Route Frequency Provider Last Rate Last Admin   acetaminophen (TYLENOL) tablet 650 mg  650 mg Oral Q6H PRN Onuoha, Chinwendu V, NP   650 mg at 07/07/22 0925   alum & mag hydroxide-simeth (MAALOX/MYLANTA) 200-200-20 MG/5ML suspension 30 mL  30 mL Oral Q4H PRN Onuoha, Chinwendu V, NP       divalproex (DEPAKOTE) DR tablet 500 mg  500 mg Oral Q12H Onuoha, Chinwendu V, NP   500 mg at 07/07/22 0925    hydrOXYzine (ATARAX) tablet 25 mg  25 mg Oral TID PRN Onuoha, Chinwendu V, NP   25 mg at 07/06/22 2104   lithium carbonate (LITHOBID) ER tablet 300 mg  300 mg Oral TID Onuoha, Chinwendu V, NP   300 mg at 07/07/22 0925   ziprasidone (GEODON) injection 20 mg  20 mg Intramuscular Q12H PRN Onuoha, Chinwendu V, NP       And   LORazepam (ATIVAN) tablet 1 mg  1 mg Oral PRN Onuoha, Chinwendu V, NP  magnesium hydroxide (MILK OF MAGNESIA) suspension 30 mL  30 mL Oral Daily PRN Onuoha, Chinwendu V, NP       OLANZapine (ZYPREXA) tablet 7.5 mg  7.5 mg Oral QHS Onuoha, Chinwendu V, NP   7.5 mg at 07/06/22 2105   traZODone (DESYREL) tablet 50 mg  50 mg Oral QHS PRN Onuoha, Chinwendu V, NP   50 mg at 07/06/22 2106   Current Outpatient Medications  Medication Sig Dispense Refill   albuterol (VENTOLIN HFA) 108 (90 Base) MCG/ACT inhaler Inhale 2 puffs into the lungs every 6 (six) hours as needed for wheezing or shortness of breath. 6.7 g 0   divalproex (DEPAKOTE) 500 MG DR tablet Take 1 tablet (500 mg total) by mouth 2 (two) times daily. 60 tablet 0   hydrOXYzine (VISTARIL) 25 MG capsule Take 1 capsule (25 mg total) by mouth every 6 (six) hours as needed for anxiety. 30 capsule 0   lithium carbonate (LITHOBID) 300 MG ER tablet Take 1 tablet (300 mg total) by mouth 3 (three) times daily. 90 tablet 0   OLANZapine (ZYPREXA) 7.5 MG tablet Take 1 tablet (7.5 mg total) by mouth at bedtime. 30 tablet 0    PTA Medications: (Not in a hospital admission)     11/08/2021   11:57 AM 11/05/2021    3:57 AM 11/04/2021    6:30 AM  Depression screen PHQ 2/9  Decreased Interest 2 3 3   Down, Depressed, Hopeless 2 3 3   PHQ - 2 Score 4 6 6   Altered sleeping 3 3 3   Tired, decreased energy 3 3 3   Change in appetite 3 2 3   Feeling bad or failure about yourself  3 3 3   Trouble concentrating 3 2 2   Moving slowly or fidgety/restless 3 2 2   Suicidal thoughts 3 3 3   PHQ-9 Score 25 24 25   Difficult doing work/chores Very  difficult Extremely dIfficult Extremely dIfficult    Flowsheet Row ED from 07/04/2022 in [facility] ED from 06/03/2022 in [facility] Department at [facility] ED from 04/26/2022 in [facility] Emergency Department at [place] Error: Q6 is Yes, you must answer 7 High Risk High Risk       Musculoskeletal  Strength & Muscle Tone: within normal limits Gait & Station: normal Patient leans: N/A   Psychiatric Specialty Exam   Presentation General Appearance:Appropriate for Environment, Casual, Fairly Groomed Eye Contact:Good Speech:Clear and Coherent, Normal Rate Volume:Normal Handedness:Right  Mood and Affect  Mood:Dysphoric Affect:Appropriate, Congruent, Full Range  Thought Process  Thought Process:Coherent, Goal Directed, Linear Descriptions of Associations:Intact  Thought Content Suicidal Thoughts:Suicidal Thoughts: Yes, Passive SI Passive Intent and/or Plan: Without Intent, Without Plan Homicidal Thoughts:Homicidal Thoughts: No Hallucinations:Hallucinations: None Description of Auditory Hallucinations: - Description of Visual Hallucinations: - Ideas of Reference:None Thought Content:Logical, WDL  Sensorium Memory:Immediate Good Judgment:Fair Insight:Fair  Executive Functions  Orientation:Full (Time, Place and Person) Language:Good Concentration:Good [place] of Knowledge:Good  Psychomotor Activity  Psychomotor Activity:Psychomotor Activity: Normal  Assets Assets:Communication Skills, Desire for Improvement, Resilience, Leisure Time, Physical Health  Sleep Quality:Good  Physical Exam  BP (!) 109/57 (BP Location: Left Arm)   Pulse 74   Temp 97.9 F (36.6 C) (Oral)   Resp 18   SpO2 98%   Physical Exam Vitals and nursing note reviewed.  Constitutional:      General: He is not in acute distress.    Appearance: He is not ill-appearing,  toxic-appearing or diaphoretic.  HENT:     Head: Normocephalic.  Pulmonary:     Effort: Pulmonary effort is normal. No respiratory distress.  Neurological:     Mental Status: He is alert.     Demographic Factors:  Male, Adolescent or young adult, Caucasian, Low socioeconomic status, Living alone, and Unemployed  Loss Factors: Decrease in vocational status, Legal issues, and Financial problems/change in socioeconomic status  Historical Factors: Prior suicide attempts and Impulsivity  Risk Reduction Factors:   Religious beliefs about death and Positive therapeutic relationship  Continued Clinical Symptoms:  More than one psychiatric diagnosis Previous Psychiatric Diagnoses and Treatments  Cognitive Features That Contribute To Risk:  Loss of executive function    Suicide Risk:  Mild:  Suicidal ideation of limited frequency, intensity, duration, and specificity.  There are no identifiable plans, no associated intent, mild dysphoria and related symptoms, good self-control (both objective and subjective assessment), few other risk factors, and identifiable protective factors, including available and accessible social support.  Plan Of Care/Follow-up recommendations:  Activity and diet at tolerated.  Please: Take all medications as prescribed by your mental healthcare provider. Report any adverse effects and or reactions from the medicines to your outpatient provider promptly. Do not engage in alcohol and or illegal drug use while on prescription medicines.  Disposition: Home with case worker   Total Time spent with patient: 20 minutes  Signed: Merrily Brittle, DO Psychiatry Resident, PGY-2 [facility] 07/07/2022, 9:58 AM

## 2022-07-07 NOTE — Care Management (Signed)
OBS Care Management   Writer met with the patient and he reports that non of the people that he called would be able to assist him.   Patient reports that he spoke to a case worker (Mrs. Nyoka Cowden) with [facility] at [phone].  Patient reports that if he is discharged then Mrs. Nyoka Cowden will be able to pick up the patient.    Writer left a voice mail message with Mrs. Green to verify that they will be able to pick up the patient if he is discharged today.  Writer will inform the NP.

## 2022-07-07 NOTE — ED Notes (Signed)
Provider states that she seen the note of the patients statement of harming himself Rn place in chart.Jon Clayton

## 2022-07-07 NOTE — ED Notes (Signed)
Patient denied tylenol or any pain  medication for his back pain.Will continue to monitor for safety.

## 2022-07-07 NOTE — ED Notes (Signed)
Rn spoke with Ms Jon Clayton @ [phone].Ms Jon Clayton sent someone to pick up [facility]. He left at 1046.Patient A&O x 4, ambulatory. Patient discharged in no acute distress. Patient denied SI/HI, A/VH upon discharge. Patient verbalized understanding of all discharge instructions explained by staff, to include follow up appointments, RX's and safety plan. Pt belongings returned to patient from locker #  26 intact. Patient escorted to lobby via staff for transport to destination. Safety maintained.

## 2022-07-07 NOTE — ED Notes (Signed)
Patient request tylenol for back pain

## 2022-07-20 ENCOUNTER — Encounter (HOSPITAL_BASED_OUTPATIENT_CLINIC_OR_DEPARTMENT_OTHER): Payer: Self-pay

## 2022-07-20 ENCOUNTER — Emergency Department (HOSPITAL_BASED_OUTPATIENT_CLINIC_OR_DEPARTMENT_OTHER): Payer: Medicaid Other

## 2022-07-20 ENCOUNTER — Other Ambulatory Visit: Payer: Self-pay

## 2022-07-20 ENCOUNTER — Emergency Department (HOSPITAL_BASED_OUTPATIENT_CLINIC_OR_DEPARTMENT_OTHER)
Admission: EM | Admit: 2022-07-20 | Discharge: 2022-07-21 | Disposition: A | Discharge status: Home / Self Care | Payer: Medicaid Other | Attending: Emergency Medicine | Admitting: Emergency Medicine

## 2022-07-20 DIAGNOSIS — F209 Schizophrenia, unspecified: Secondary | ICD-10-CM | POA: Diagnosis not present

## 2022-07-20 DIAGNOSIS — Z1152 Encounter for screening for COVID-19: Secondary | ICD-10-CM | POA: Insufficient documentation

## 2022-07-20 DIAGNOSIS — F1910 Other psychoactive substance abuse, uncomplicated: Secondary | ICD-10-CM | POA: Insufficient documentation

## 2022-07-20 DIAGNOSIS — Z5900 Homelessness unspecified: Secondary | ICD-10-CM

## 2022-07-20 DIAGNOSIS — F1990 Other psychoactive substance use, unspecified, uncomplicated: Secondary | ICD-10-CM | POA: Diagnosis not present

## 2022-07-20 DIAGNOSIS — R45850 Homicidal ideations: Secondary | ICD-10-CM | POA: Insufficient documentation

## 2022-07-20 DIAGNOSIS — R45851 Suicidal ideations: Secondary | ICD-10-CM | POA: Diagnosis not present

## 2022-07-20 DIAGNOSIS — T50901A Poisoning by unspecified drugs, medicaments and biological substances, accidental (unintentional), initial encounter: Secondary | ICD-10-CM | POA: Diagnosis not present

## 2022-07-20 LAB — RAPID URINE DRUG SCREEN, HOSP PERFORMED
Amphetamines: NOT DETECTED
Barbiturates: NOT DETECTED
Benzodiazepines: NOT DETECTED
Cocaine: NOT DETECTED
Opiates: NOT DETECTED
Tetrahydrocannabinol: NOT DETECTED

## 2022-07-20 LAB — COMPREHENSIVE METABOLIC PANEL
ALT: 12 U/L (ref 0–44)
AST: 24 U/L (ref 15–41)
Albumin: 4.2 g/dL (ref 3.5–5.0)
Alkaline Phosphatase: 47 U/L (ref 38–126)
Anion gap: 8 (ref 5–15)
BUN: 18 mg/dL (ref 6–20)
CO2: 25 mmol/L (ref 22–32)
Calcium: 8.4 mg/dL — ABNORMAL LOW (ref 8.9–10.3)
Chloride: 102 mmol/L (ref 98–111)
Creatinine, Ser: 1.06 mg/dL (ref 0.61–1.24)
GFR, Estimated: >60 mL/min (ref >60)
Glucose, Bld: 164 mg/dL — ABNORMAL HIGH (ref 70–99)
Potassium: 3.7 mmol/L (ref 3.5–5.1)
Sodium: 135 mmol/L (ref 135–145)
Total Bilirubin: 0.5 mg/dL (ref 0.3–1.2)
Total Protein: 6.9 g/dL (ref 6.5–8.1)

## 2022-07-20 LAB — CBC
HCT: 41.9 % (ref 39.0–52.0)
Hemoglobin: 13.8 g/dL (ref 13.0–17.0)
MCH: 29.5 pg (ref 26.0–34.0)
MCHC: 32.9 g/dL (ref 30.0–36.0)
MCV: 89.5 fL (ref 80.0–100.0)
Platelets: 261 10*3/uL (ref 150–400)
RBC: 4.68 MIL/uL (ref 4.22–5.81)
RDW: 13.5 % (ref 11.5–15.5)
WBC: 10.9 10*3/uL — ABNORMAL HIGH (ref 4.0–10.5)
nRBC: 0 % (ref 0.0–0.2)

## 2022-07-20 LAB — ACETAMINOPHEN LEVEL: Acetaminophen (Tylenol), Serum: <10 ug/mL — ABNORMAL LOW (ref 10–30)

## 2022-07-20 LAB — ETHANOL: Alcohol, Ethyl (B): <10 mg/dL (ref <10)

## 2022-07-20 LAB — SALICYLATE LEVEL: Salicylate Lvl: <7 mg/dL — ABNORMAL LOW (ref 7.0–30.0)

## 2022-07-20 MED ORDER — OLANZAPINE 7.5 MG PO TABS: 7.5000 mg | ORAL_TABLET | Freq: Every day | ORAL | Status: DC

## 2022-07-20 MED ORDER — ALBUTEROL SULFATE HFA 108 (90 BASE) MCG/ACT IN AERS: 2.0000 | INHALATION_SPRAY | Freq: Four times a day (QID) | RESPIRATORY_TRACT | Status: DC | PRN

## 2022-07-20 MED ORDER — HYDROXYZINE HCL 25 MG PO TABS: 25.0000 mg | ORAL_TABLET | Freq: Four times a day (QID) | ORAL | Status: DC | PRN

## 2022-07-20 MED ORDER — LITHIUM CARBONATE ER 300 MG PO TBCR: 300.0000 mg | EXTENDED_RELEASE_TABLET | Freq: Three times a day (TID) | ORAL | Status: DC

## 2022-07-20 MED ORDER — ACETAMINOPHEN 325 MG PO TABS: 650.0000 mg | ORAL_TABLET | ORAL | Status: DC | PRN

## 2022-07-20 MED ORDER — NICOTINE 21 MG/24HR TD PT24: 21.0000 mg | MEDICATED_PATCH | Freq: Every day | TRANSDERMAL | Status: DC

## 2022-07-20 MED ORDER — DIVALPROEX SODIUM 250 MG PO DR TAB
500.0000 mg | DELAYED_RELEASE_TABLET | Freq: Two times a day (BID) | ORAL | Status: DC
  Administered 2022-07-20: 500 mg via ORAL
  Filled 2022-07-20: qty 2

## 2022-07-20 NOTE — ED Notes (Signed)
ED Provider at bedside. 

## 2022-07-20 NOTE — ED Notes (Signed)
Pt given a Mac and cheese dinner and ginger ale per request

## 2022-07-20 NOTE — ED Notes (Signed)
States at times hears voices, "its negative stuff man", "I am not doing good enough"

## 2022-07-20 NOTE — BH Assessment (Addendum)
This clinician called [facility] and spoke to [place].  Explained that patient be seen after 21:00 by TTS.
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
## 2022-07-20 NOTE — ED Notes (Signed)
Spoke with MD Melina Copa in regards to pt requiring sitter due to previous shift stating pt was not going to require 1 on 1 observation. MD Melina Copa discontinuing sitter order at this time.

## 2022-07-20 NOTE — ED Notes (Signed)
Pt is speaking to TTS at this time

## 2022-07-20 NOTE — ED Triage Notes (Signed)
Patient presents to ED. Patient is currently homeless. Patient states "I was just in the hospital but they kicked me out because I was mean". Patient reports SI with a plan to stand in traffic.
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
## 2022-07-20 NOTE — ED Notes (Signed)
Security requested to wand and ck patient Snacks and drinks provided to client Very calm and cooperative at this time.

## 2022-07-20 NOTE — ED Notes (Signed)
TTS will see pt in the next 30-45 minutes, per Network engineer. Primary RN aware.

## 2022-07-20 NOTE — ED Provider Notes (Signed)
[facility] Provider Note   CSN: [id number] Arrival date & time: 07/20/22  1328     History {Add pertinent medical, surgical, social history, OB history to HPI:1} Chief Complaint  Patient presents with   Suicidal    Jon Clayton is a 27 y.o. male.  He has a history of substance abuse mental health disorder.  He was brought in by police for evaluation of depression and suicidal ideation and homicidal ideation.  He said he was just [facility] of [facility] after being disruptive.  He thinks he has been using methamphetamines but he is not sure.  He has not been taking his psychiatric medications for at least a week.  He is homeless.  He denies any acute medical complaints.  The history is provided by the patient.  Mental Health Problem Presenting symptoms: homicidal ideas, self-mutilation and suicidal thoughts   Degree of incapacity (severity):  Unable to specify Onset quality:  Unable to specify Treatment compliance:  Untreated Ineffective treatments:  None tried Associated symptoms: no abdominal pain, no chest pain and no headaches   Risk factors: hx of mental illness and recent psychiatric admission        Home Medications Prior to Admission medications   Medication Sig Start Date End Date Taking? Authorizing Provider  albuterol (VENTOLIN HFA) 108 (90 Base) MCG/ACT inhaler Inhale 2 puffs into the lungs every 6 (six) hours as needed for wheezing or shortness of breath. 07/07/22   Merrily Brittle, DO  divalproex (DEPAKOTE) 500 MG DR tablet Take 1 tablet (500 mg total) by mouth 2 (two) times daily. 07/07/22 08/06/22  Merrily Brittle, DO  hydrOXYzine (VISTARIL) 25 MG capsule Take 1 capsule (25 mg total) by mouth every 6 (six) hours as needed for anxiety. 07/07/22   Merrily Brittle, DO  lithium carbonate (LITHOBID) 300 MG ER tablet Take 1 tablet (300 mg total) by mouth 3 (three) times daily. 07/07/22 08/06/22  Merrily Brittle, DO  OLANZapine  (ZYPREXA) 7.5 MG tablet Take 1 tablet (7.5 mg total) by mouth at bedtime. 07/07/22 08/06/22  Merrily Brittle, DO      Allergies    Amoxicillin, Penicillins, Other, Pertussis vaccines, and Blue mussel [mytilus]    Review of Systems   Review of Systems  Constitutional:  Negative for fever.  HENT:  Negative for sore throat.   Respiratory:  Negative for shortness of breath.   Cardiovascular:  Negative for chest pain.  Gastrointestinal:  Negative for abdominal pain.  Genitourinary:  Negative for dysuria.  Skin:  Negative for rash.  Neurological:  Negative for headaches.  Psychiatric/Behavioral:  Positive for homicidal ideas, self-injury and suicidal ideas.     Physical Exam Updated Vital Signs BP 114/86 (BP Location: Right Arm)   Pulse 90   Temp 98.5 F (36.9 C) (Oral)   Resp 20   Ht 5\' 7"  (1.702 m)   Wt 71.7 kg   SpO2 99%   BMI 24.75 kg/m  Physical Exam Vitals and nursing note reviewed.  Constitutional:      General: He is not in acute distress.    Appearance: Normal appearance. He is well-developed.  HENT:     Head: Normocephalic and atraumatic.  Eyes:     Conjunctiva/sclera: Conjunctivae normal.  Cardiovascular:     Rate and Rhythm: Normal rate and regular rhythm.     Heart sounds: No murmur heard. Pulmonary:     Effort: Pulmonary effort is normal. No respiratory distress.  Breath sounds: Normal breath sounds.  Abdominal:     Palpations: Abdomen is soft.     Tenderness: There is no abdominal tenderness.  Musculoskeletal:        General: No deformity.     Cervical back: Neck supple.     Comments: Superficial scratches to his forearms  Skin:    General: Skin is warm and dry.     Capillary Refill: Capillary refill takes less than 2 seconds.  Neurological:     General: No focal deficit present.     Mental Status: He is alert.     ED Results / Procedures / Treatments   Labs (all labs ordered are listed, but only abnormal results are displayed) Labs Reviewed   RAPID URINE DRUG SCREEN, HOSP PERFORMED  COMPREHENSIVE METABOLIC PANEL  ETHANOL  SALICYLATE LEVEL  ACETAMINOPHEN LEVEL  CBC    EKG None  Radiology No results found.  Procedures Procedures  {Document cardiac monitor, telemetry assessment procedure when appropriate:1}  Medications Ordered in ED Medications - No data to display  ED Course/ Medical Decision Making/ A&P   {   Click here for ABCD2, HEART and other calculatorsREFRESH Note before signing :1}                          Medical Decision Making Amount and/or Complexity of Data Reviewed Labs: ordered.   This patient complains of ***; this involves an extensive number of treatment Options and is a complaint that carries with it a high risk of complications and morbidity. The differential includes ***  I ordered, reviewed and interpreted labs, which included *** I ordered medication *** and reviewed PMP when indicated. I ordered imaging studies which included *** and I independently    visualized and interpreted imaging which showed *** Additional history obtained from *** Previous records obtained and reviewed *** I consulted *** and discussed lab and imaging findings and discussed disposition.  Cardiac monitoring reviewed, *** Social determinants considered, *** Critical Interventions: ***  After the interventions stated above, I reevaluated the patient and found *** Admission and further testing considered, ***   {Document critical care time when appropriate:1} {Document review of labs and clinical decision tools ie heart score, Chads2Vasc2 etc:1}  {Document your independent review of radiology images, and any outside records:1} {Document your discussion with family members, caretakers, and with consultants:1} {Document social determinants of health affecting pt's care:1} {Document your decision making why or why not admission, treatments were needed:1} Final Clinical Impression(s) / ED Diagnoses Final  diagnoses:  None    Rx / DC Orders ED Discharge Orders     None

## 2022-07-20 NOTE — ED Notes (Signed)
Pt's belongings placed in belonging cabinet.  1 Pt Belonging Bag

## 2022-07-20 NOTE — ED Notes (Signed)
Pt requested some graham crackers and peanut butter. Pt given the same.

## 2022-07-21 ENCOUNTER — Encounter (HOSPITAL_COMMUNITY): Payer: Self-pay

## 2022-07-21 ENCOUNTER — Other Ambulatory Visit: Payer: Self-pay

## 2022-07-21 ENCOUNTER — Emergency Department (EMERGENCY_DEPARTMENT_HOSPITAL)
Admission: EM | Admit: 2022-07-21 | Discharge: 2022-07-22 | Disposition: A | Discharge status: Other Acute Inpatient Hospital | Payer: Medicaid Other | Source: Home / Self Care | Attending: Emergency Medicine | Admitting: Emergency Medicine

## 2022-07-21 DIAGNOSIS — Z20822 Contact with and (suspected) exposure to covid-19: Secondary | ICD-10-CM | POA: Insufficient documentation

## 2022-07-21 DIAGNOSIS — T50901A Poisoning by unspecified drugs, medicaments and biological substances, accidental (unintentional), initial encounter: Secondary | ICD-10-CM | POA: Diagnosis not present

## 2022-07-21 DIAGNOSIS — R45851 Suicidal ideations: Secondary | ICD-10-CM

## 2022-07-21 DIAGNOSIS — F1990 Other psychoactive substance use, unspecified, uncomplicated: Secondary | ICD-10-CM | POA: Diagnosis present

## 2022-07-21 DIAGNOSIS — F1910 Other psychoactive substance abuse, uncomplicated: Secondary | ICD-10-CM | POA: Insufficient documentation

## 2022-07-21 DIAGNOSIS — Z5900 Homelessness unspecified: Secondary | ICD-10-CM

## 2022-07-21 LAB — CBC
HCT: 43.7 % (ref 39.0–52.0)
Hemoglobin: 13.8 g/dL (ref 13.0–17.0)
MCH: 29.2 pg (ref 26.0–34.0)
MCHC: 31.6 g/dL (ref 30.0–36.0)
MCV: 92.4 fL (ref 80.0–100.0)
Platelets: 240 10*3/uL (ref 150–400)
RBC: 4.73 MIL/uL (ref 4.22–5.81)
RDW: 13.4 % (ref 11.5–15.5)
WBC: 6.4 10*3/uL (ref 4.0–10.5)
nRBC: 0 % (ref 0.0–0.2)

## 2022-07-21 LAB — COMPREHENSIVE METABOLIC PANEL
ALT: 12 U/L (ref 0–44)
ALT: 11 U/L (ref 0–44)
AST: 18 U/L (ref 15–41)
AST: 18 U/L (ref 15–41)
Albumin: 3.8 g/dL (ref 3.5–5.0)
Albumin: 3.4 g/dL — ABNORMAL LOW (ref 3.5–5.0)
Alkaline Phosphatase: 46 U/L (ref 38–126)
Alkaline Phosphatase: 38 U/L (ref 38–126)
Anion gap: 10 (ref 5–15)
Anion gap: 8 (ref 5–15)
BUN: 11 mg/dL (ref 6–20)
BUN: 12 mg/dL (ref 6–20)
CO2: 25 mmol/L (ref 22–32)
CO2: 26 mmol/L (ref 22–32)
Calcium: 8.7 mg/dL — ABNORMAL LOW (ref 8.9–10.3)
Calcium: 8.5 mg/dL — ABNORMAL LOW (ref 8.9–10.3)
Chloride: 105 mmol/L (ref 98–111)
Chloride: 107 mmol/L (ref 98–111)
Creatinine, Ser: 0.94 mg/dL (ref 0.61–1.24)
Creatinine, Ser: 0.99 mg/dL (ref 0.61–1.24)
GFR, Estimated: >60 mL/min (ref >60)
GFR, Estimated: >60 mL/min (ref >60)
Glucose, Bld: 127 mg/dL — ABNORMAL HIGH (ref 70–99)
Glucose, Bld: 94 mg/dL (ref 70–99)
Potassium: 4.3 mmol/L (ref 3.5–5.1)
Potassium: 4.3 mmol/L (ref 3.5–5.1)
Sodium: 140 mmol/L (ref 135–145)
Sodium: 141 mmol/L (ref 135–145)
Total Bilirubin: 0.3 mg/dL (ref 0.3–1.2)
Total Bilirubin: 0.6 mg/dL (ref 0.3–1.2)
Total Protein: 6 g/dL — ABNORMAL LOW (ref 6.5–8.1)
Total Protein: 5.4 g/dL — ABNORMAL LOW (ref 6.5–8.1)

## 2022-07-21 LAB — SALICYLATE LEVEL
Salicylate Lvl: <7 mg/dL — ABNORMAL LOW (ref 7.0–30.0)
Salicylate Lvl: <7 mg/dL — ABNORMAL LOW (ref 7.0–30.0)

## 2022-07-21 LAB — RAPID URINE DRUG SCREEN, HOSP PERFORMED
Amphetamines: NOT DETECTED
Barbiturates: NOT DETECTED
Benzodiazepines: NOT DETECTED
Cocaine: NOT DETECTED
Opiates: NOT DETECTED
Tetrahydrocannabinol: NOT DETECTED

## 2022-07-21 LAB — LITHIUM LEVEL
Lithium Lvl: <0.06 mmol/L — ABNORMAL LOW (ref 0.60–1.20)
Lithium Lvl: <0.06 mmol/L — ABNORMAL LOW (ref 0.60–1.20)

## 2022-07-21 LAB — ETHANOL: Alcohol, Ethyl (B): <10 mg/dL (ref <10)

## 2022-07-21 LAB — ACETAMINOPHEN LEVEL
Acetaminophen (Tylenol), Serum: <10 ug/mL — ABNORMAL LOW (ref 10–30)
Acetaminophen (Tylenol), Serum: <10 ug/mL — ABNORMAL LOW (ref 10–30)

## 2022-07-21 NOTE — ED Notes (Addendum)
Pt came to this RN after dressing in burgundy scrubs & handed over 7 yellow, circular pills with Y17 on one side & LU on the opposite side that he stated he thinks are Seroquel that he admitted to taking 10 of before coming to ED. This RN noted he was getting very lethargic & his eyes rolled into the back of his head seconds before swaying on his feet then told him to go sit down before falling. Pt was saying he was nauseous as well, this RN informed Charge RN & pt was roomed.

## 2022-07-21 NOTE — Consult Note (Addendum)
[facility] Consult   Reason for Consult:  Suicidal with plan Referring Physician:  Gwenevere Abbot, PA-C   Patient Identification: Jon Clayton MRN:  [id number] Principal Diagnosis: Suicidal ideation Diagnosis:  Principal Problem:   Suicidal ideation Active Problems:   Polysubstance use disorder  Total Time spent with patient: 45 minutes  Subjective:   [name] is a 27 y.o. male patient admitted with suicidal ideation with a plan to run into traffic.  HPI:  [name] is a 27 y.o male patient with history of multiple psych hospitalization. Patient was discharged from the ER today in [facility] for the same presentation. Patient presented back the ED today due to suicidal ideation with a plan to go into traffic.   Patient was seen face to face by this provider and chart reviewed.   On evaluation patient is alert and oriented x3, speech is clear and coherent. Patient's eye contact is good, mood is dysphoric, affect is appropriate. Patient's thought process is goal directed and thought content is within normal limits.  Patient endorses suicidal ideation with several plans; going into traffic, cutting himself or jump off a high building. Patient denies HI, AVH, or paranoia.There is no indication that the patient is responding to internal stimuli and no delusion noted.    Patient is familiar to [facility] and past several ED visits for mental health evaluation and homelessness.   During assessment, patient reported that he was seen at [facility] yesterday, he said he was kicked out after being disruptive, patient says he was just upset because he was not able to speak to his mom.   Patient's last visits to the ED was 07/20/22, he was evaluated for suicidal ideation with plans to stand infront of traffic. Patient was evaluated and discharged this morning to follow-up with outpatient psychiatric and was given resources.  Patient returned back today to the ED  endorsing suicidal ideation several plans; to cut himself, plan to stand in traffic, plan to jump off from a high building.  Patient did not contract for safety.   Patient reported that he took several Seroquel before presenting to [facility], he said he took the pills in an attempt to go to sleep and stay asleep.  Patient denies using any substances or alcohol before presenting to the ED today.   Support, encouragement and reassurance provided about ongoing stressors and patient provided with opportunity for questions.    Recommends inpatient psychiatric admission for stabilization and treatment.   Past Psychiatric History: Suicidal ideation, MDD, cannabis use disorder, PTSD, antisocial behavior, personality disorder, polysubstance abuse  Risk to Self: Yes Risk to Others: No  Prior Inpatient Therapy: Yes Prior Outpatient Therapy: Yes  Past Medical History:  Past Medical History:  Diagnosis Date   Depression    Psychosis ([place])    PTSD (post-traumatic stress disorder)     Past Surgical History:  Procedure Laterality Date   NASAL SINUS SURGERY Bilateral    TONSILLECTOMY     Family History: History reviewed. No pertinent family history. Family Psychiatric  History: Not provided Social History:  Social History   Substance and Sexual Activity  Alcohol Use Not Currently   Comment: reports periodic alcohol use     Social History   Substance and Sexual Activity  Drug Use Yes   Frequency: 1.0 times per week   Types: Marijuana   Comment: 1/3 gm daily    Social History   Socioeconomic History   Marital status: Single  Spouse name: Not on file   Number of children: Not on file   Years of education: Not on file   Highest education level: 8th grade  Occupational History   Not on file  Tobacco Use   Smoking status: Every Day    Packs/day: 1.00    Years: 11.00    Total pack years: 11.00    Types: Cigarettes   Smokeless tobacco: Never  Vaping Use   Vaping  Use: Never used  Substance and Sexual Activity   Alcohol use: Not Currently    Comment: reports periodic alcohol use   Drug use: Yes    Frequency: 1.0 times per week    Types: Marijuana    Comment: 1/3 gm daily   Sexual activity: Not Currently  Other Topics Concern   Not on file  Social History Narrative   Not on file   Social Determinants of Health   Financial Resource Strain: Not on file  Food Insecurity: Not on file  Transportation Needs: Not on file  Physical Activity: Not on file  Stress: Not on file  Social Connections: Not on file   Additional Social History:    Allergies:   Allergies  Allergen Reactions   Amoxicillin Anaphylaxis and Rash   Penicillins Anaphylaxis and Rash   Other Swelling and Other (See Comments)    Pt said he is allergic to tapioca. Causes big knots to appear on his tongue.   Pertussis Vaccines Hives and Other (See Comments)    Ped. MD said he was allergic to a medication in the vaccine.   Blue Mussel [Mytilus] Swelling, Rash and Other (See Comments)    Itchy throat    Labs:  Results for orders placed or performed during the hospital encounter of 07/21/22 (from the past 48 hour(s))  Ethanol     Status: None   Collection Time: 07/21/22  2:25 PM  Result Value Ref Range   Alcohol, Ethyl (B) <10 <10 mg/dL    Comment: (NOTE) Lowest detectable limit for serum alcohol is 10 mg/dL.  For medical purposes only. Performed at [facility], [place] [address], [place], [place] [id number]   Salicylate level     Status: Abnormal   Collection Time: 07/21/22  2:25 PM  Result Value Ref Range   Salicylate Lvl Q000111Q (L) 7.0 - 30.0 mg/dL    Comment: Performed at [facility] [address], [place], [place] [address]  Acetaminophen level     Status: Abnormal   Collection Time: 07/21/22  2:25 PM  Result Value Ref Range   Acetaminophen (Tylenol), Serum <10 (L) 10 - 30 ug/mL    Comment: (NOTE) Therapeutic concentrations vary significantly. A  range of 10-30 ug/mL  may be an effective concentration for many patients. However, some  are best treated at concentrations outside of this range. Acetaminophen concentrations >150 ug/mL at 4 hours after ingestion  and >50 ug/mL at 12 hours after ingestion are often associated with  toxic reactions.  Performed at [facility], [place] [address], [place], [place] [address]   Comprehensive metabolic panel     Status: Abnormal   Collection Time: 07/21/22  2:29 PM  Result Value Ref Range   Sodium 140 135 - 145 mmol/L   Potassium 4.3 3.5 - 5.1 mmol/L   Chloride 105 98 - 111 mmol/L   CO2 25 22 - 32 mmol/L   Glucose, Bld 127 (H) 70 - 99 mg/dL    Comment: Glucose reference range  applies only to samples taken after fasting for at least 8 hours.   BUN 11 6 - 20 mg/dL   Creatinine, Ser 0.94 0.61 - 1.24 mg/dL   Calcium 8.7 (L) 8.9 - 10.3 mg/dL   Total Protein 6.0 (L) 6.5 - 8.1 g/dL   Albumin 3.8 3.5 - 5.0 g/dL   AST 18 15 - 41 U/L   ALT 12 0 - 44 U/L   Alkaline Phosphatase 46 38 - 126 U/L   Total Bilirubin 0.3 0.3 - 1.2 mg/dL   GFR, Estimated >60 >60 mL/min    Comment: (NOTE) Calculated using the CKD-EPI Creatinine Equation (2021)    Anion gap 10 5 - 15    Comment: Performed at [facility], [place] [address] [place], [place] [address]  cbc     Status: None   Collection Time: 07/21/22  2:29 PM  Result Value Ref Range   WBC 6.4 4.0 - 10.5 K/uL   RBC 4.73 4.22 - 5.81 MIL/uL   Hemoglobin 13.8 13.0 - 17.0 g/dL   HCT 43.7 39.0 - 52.0 %   MCV 92.4 80.0 - 100.0 fL   MCH 29.2 26.0 - 34.0 pg   MCHC 31.6 30.0 - 36.0 g/dL   RDW 13.4 11.5 - 15.5 %   Platelets 240 150 - 400 K/uL   nRBC 0.0 0.0 - 0.2 %    Comment: Performed at [facility], [place] [address] [place], [place] [address]  Urine rapid drug screen (hosp performed)     Status: None   Collection Time: 07/21/22  3:24 PM  Result Value Ref Range   Opiates NONE DETECTED NONE DETECTED   Cocaine NONE DETECTED NONE DETECTED    Benzodiazepines NONE DETECTED NONE DETECTED   Amphetamines NONE DETECTED NONE DETECTED   Tetrahydrocannabinol NONE DETECTED NONE DETECTED   Barbiturates NONE DETECTED NONE DETECTED    Comment: (NOTE) DRUG SCREEN FOR MEDICAL PURPOSES ONLY.  IF CONFIRMATION IS NEEDED FOR ANY PURPOSE, NOTIFY LAB WITHIN 5 DAYS.  LOWEST DETECTABLE LIMITS FOR URINE DRUG SCREEN Drug Class                     Cutoff (ng/mL) Amphetamine and metabolites    1000 Barbiturate and metabolites    200 Benzodiazepine                 200 Opiates and metabolites        300 Cocaine and metabolites        300 THC                            50 Performed at [facility], [place] [address], [place], [place] [address]   Lithium level     Status: Abnormal   Collection Time: 07/21/22  5:53 PM  Result Value Ref Range   Lithium Lvl <0.06 (L) 0.60 - 1.20 mmol/L    Comment: Performed at [facility] [address] [place], [place] [address]  Comprehensive metabolic panel     Status: Abnormal   Collection Time: 07/21/22  5:53 PM  Result Value Ref Range   Sodium 141 135 - 145 mmol/L   Potassium 4.3 3.5 - 5.1 mmol/L   Chloride 107 98 - 111 mmol/L   CO2 26 22 - 32 mmol/L   Glucose, Bld 94 70 - 99 mg/dL    Comment: Glucose reference range applies only to samples taken  after fasting for at least 8 hours.   BUN 12 6 - 20 mg/dL   Creatinine, Ser 0.99 0.61 - 1.24 mg/dL   Calcium 8.5 (L) 8.9 - 10.3 mg/dL   Total Protein 5.4 (L) 6.5 - 8.1 g/dL   Albumin 3.4 (L) 3.5 - 5.0 g/dL   AST 18 15 - 41 U/L   ALT 11 0 - 44 U/L   Alkaline Phosphatase 38 38 - 126 U/L   Total Bilirubin 0.6 0.3 - 1.2 mg/dL   GFR, Estimated >60 >60 mL/min    Comment: (NOTE) Calculated using the CKD-EPI Creatinine Equation (2021)    Anion gap 8 5 - 15    Comment: Performed at [facility], [place] [address], [place], [place] [address]  Acetaminophen level     Status: Abnormal   Collection Time: 07/21/22  5:53 PM  Result Value Ref Range    Acetaminophen (Tylenol), Serum <10 (L) 10 - 30 ug/mL    Comment: (NOTE) Therapeutic concentrations vary significantly. A range of 10-30 ug/mL  may be an effective concentration for many patients. However, some  are best treated at concentrations outside of this range. Acetaminophen concentrations >150 ug/mL at 4 hours after ingestion  and >50 ug/mL at 12 hours after ingestion are often associated with  toxic reactions.  Performed at [facility], [place] [address], [place], [place] [id number]   Salicylate level     Status: Abnormal   Collection Time: 07/21/22  5:53 PM  Result Value Ref Range   Salicylate Lvl Q000111Q (L) 7.0 - 30.0 mg/dL    Comment: Performed at [facility] [address] [place], [place] [address]    No current facility-administered medications for this encounter.   Current Outpatient Medications  Medication Sig Dispense Refill   albuterol (VENTOLIN HFA) 108 (90 Base) MCG/ACT inhaler Inhale 2 puffs into the lungs every 6 (six) hours as needed for wheezing or shortness of breath. 6.7 g 0   divalproex (DEPAKOTE) 500 MG DR tablet Take 1 tablet (500 mg total) by mouth 2 (two) times daily. 60 tablet 0   hydrOXYzine (VISTARIL) 25 MG capsule Take 1 capsule (25 mg total) by mouth every 6 (six) hours as needed for anxiety. 30 capsule 0   lithium carbonate (LITHOBID) 300 MG ER tablet Take 1 tablet (300 mg total) by mouth 3 (three) times daily. 90 tablet 0   OLANZapine (ZYPREXA) 7.5 MG tablet Take 1 tablet (7.5 mg total) by mouth at bedtime. 30 tablet 0    Musculoskeletal: Strength & Muscle Tone: within normal limits Gait & Station: normal Patient leans: N/A   Psychiatric Specialty Exam:  Presentation  General Appearance:  Appropriate for Environment  Eye Contact: Good  Speech: Clear and Coherent  Speech Volume: Normal  Handedness: Right   Mood and Affect  Mood: Dysphoric  Affect: Appropriate   Thought Process  Thought Processes: Goal  Directed  Descriptions of Associations:Intact  Orientation:Full (Time, Place and Person)  Thought Content:WDL  History of Schizophrenia/Schizoaffective disorder:Yes  Duration of Psychotic Symptoms:Greater than six months  Hallucinations:Hallucinations: None  Ideas of Reference:None  Suicidal Thoughts:Suicidal Thoughts: Yes, Active SI Active Intent and/or Plan: With Intent; With Plan SI Passive Intent and/or Plan: With Intent  Homicidal Thoughts:Homicidal Thoughts: No   Sensorium  Memory: Immediate Good; Recent Good; Remote Good  Judgment: Fair  Insight: Fair   Executive Functions  Concentration: Good  Attention Span: Good  Recall: Good  Fund of Knowledge: Good  Language:  Good   Psychomotor Activity  Psychomotor Activity: Psychomotor Activity: Normal   Assets  Assets: Communication Skills; Physical Health; Desire for Improvement   Sleep  Sleep: Sleep: Good   Physical Exam: Physical Exam Vitals and nursing note reviewed.  Eyes:     General:        Right eye: No discharge.        Left eye: No discharge.  Pulmonary:     Effort: No respiratory distress.     Breath sounds: No wheezing.  Neurological:     Mental Status: He is alert and oriented to person, place, and time.     Motor: No weakness.  Psychiatric:        Attention and Perception: He does not perceive auditory or visual hallucinations.        Mood and Affect: Mood is not anxious.        Speech: Speech normal.        Behavior: Behavior is cooperative.        Thought Content: Thought content is not paranoid. Thought content includes suicidal ideation. Thought content does not include homicidal ideation. Thought content includes suicidal plan. Thought content does not include homicidal plan.    Review of Systems  Constitutional:  Negative for fever.  HENT:  Negative for ear discharge and ear pain.   Eyes:  Negative for discharge and redness.  Respiratory:  Negative for cough,  shortness of breath and wheezing.   Cardiovascular:  Negative for chest pain.  Gastrointestinal:  Negative for nausea and vomiting.  Neurological:  Negative for seizures and weakness.  Psychiatric/Behavioral:  Positive for substance abuse and suicidal ideas. Negative for depression and hallucinations.    Blood pressure 107/62, pulse 77, temperature (!) 97.3 F (36.3 C), temperature source Oral, resp. rate 15, height 5' 7"$  (1.702 m), weight 71.7 kg, SpO2 95 %. Body mass index is 24.75 kg/m.  Treatment Plan Summary: Plan : Recommend inpatient admission for treatment and stabilization.  Disposition: Recommend psychiatric Inpatient admission when medically cleared.  Earney Mallet, NP 07/21/2022 7:14 PM

## 2022-07-21 NOTE — ED Provider Notes (Signed)
[facility] Provider Note   CSN: [id number] Arrival date & time: 07/21/22  1401     History  No chief complaint on file.   Jon Clayton is a 27 y.o. male.  Presents later emergency department complaining of suicidal thoughts with a plan to step on traffic.  He was discharged from the ER in [facility] today for the same problem.  States symptoms did not change.  He states he hears voices talking about him but denies command hallucinations. He reports he took 1-15 seroquel about an hour PTA, he is not sure if is is ER or IR, or the dosage. He denies other ingestions  HPI     Home Medications Prior to Admission medications   Medication Sig Start Date End Date Taking? Authorizing Provider  albuterol (VENTOLIN HFA) 108 (90 Base) MCG/ACT inhaler Inhale 2 puffs into the lungs every 6 (six) hours as needed for wheezing or shortness of breath. 07/07/22   Merrily Brittle, DO  divalproex (DEPAKOTE) 500 MG DR tablet Take 1 tablet (500 mg total) by mouth 2 (two) times daily. 07/07/22 08/06/22  Merrily Brittle, DO  hydrOXYzine (VISTARIL) 25 MG capsule Take 1 capsule (25 mg total) by mouth every 6 (six) hours as needed for anxiety. 07/07/22   Merrily Brittle, DO  lithium carbonate (LITHOBID) 300 MG ER tablet Take 1 tablet (300 mg total) by mouth 3 (three) times daily. 07/07/22 08/06/22  Merrily Brittle, DO  OLANZapine (ZYPREXA) 7.5 MG tablet Take 1 tablet (7.5 mg total) by mouth at bedtime. 07/07/22 08/06/22  Merrily Brittle, DO      Allergies    Amoxicillin, Penicillins, Other, Pertussis vaccines, and Blue mussel [mytilus]    Review of Systems   Review of Systems  Physical Exam Updated Vital Signs BP 105/60   Pulse 98   Temp 98.6 F (37 C)   Resp 14   Ht 5' 7"$  (1.702 m)   Wt 71.7 kg   SpO2 93%   BMI 24.75 kg/m  Physical Exam Constitutional:      General: He is not in acute distress.    Comments: Drowsy but eyes are spontaneously open   HENT:      Head: Normocephalic and atraumatic.     Mouth/Throat:     Mouth: Mucous membranes are moist.  Eyes:     Extraocular Movements: Extraocular movements intact.     Pupils: Pupils are equal, round, and reactive to light.  Cardiovascular:     Rate and Rhythm: Normal rate.  Pulmonary:     Effort: Pulmonary effort is normal.     Breath sounds: Normal breath sounds.  Abdominal:     Palpations: Abdomen is soft.     Tenderness: There is no abdominal tenderness.  Musculoskeletal:        General: Normal range of motion.     Cervical back: Normal range of motion.  Neurological:     General: No focal deficit present.  Psychiatric:        Behavior: Behavior normal.     ED Results / Procedures / Treatments   Labs (all labs ordered are listed, but only abnormal results are displayed) Labs Reviewed  COMPREHENSIVE METABOLIC PANEL - Abnormal; Notable for the following components:      Result Value   Glucose, Bld 127 (*)    Calcium 8.7 (*)    Total Protein 6.0 (*)    All other components within normal limits  SALICYLATE LEVEL - Abnormal; Notable  for the following components:   Salicylate Lvl Q000111Q (*)    All other components within normal limits  ACETAMINOPHEN LEVEL - Abnormal; Notable for the following components:   Acetaminophen (Tylenol), Serum <10 (*)    All other components within normal limits  ETHANOL  CBC  RAPID URINE DRUG SCREEN, HOSP PERFORMED  RAPID URINE DRUG SCREEN, HOSP PERFORMED    EKG EKG Interpretation  Date/Time:  Thursday July 21 2022 15:24:19 EST Ventricular Rate:  99 PR Interval:  130 QRS Duration: 83 QT Interval:  343 QTC Calculation: 441 R Axis:   69 Text Interpretation: Sinus rhythm Baseline wander in lead(s) V1 similar to prior no stemi Confirmed by Wynona Dove ([id number]) on 07/21/2022 3:27:09 PM  Radiology DG Hand Complete Right  Result Date: 07/20/2022 CLINICAL DATA:  Right hand pain without known injury. EXAM: RIGHT HAND - COMPLETE 3+ VIEW COMPARISON:   June 06, 2022. FINDINGS: There is no evidence of fracture or dislocation. There is no evidence of arthropathy or other focal bone abnormality. Soft tissues are unremarkable. IMPRESSION: Negative. Electronically Signed   By: Marijo Conception M.D.   On: 07/20/2022 15:58    Procedures Procedures    Medications Ordered in ED Medications - No data to display  ED Course/ Medical Decision Making/ A&P Clinical Course as of 07/21/22 1640  Thu Jul 21, 2022  1639 Spoke with [place] control.  They recommend a 4-hour repeat Tylenol salicylate level and CMP.  If we are able to find out if this was immediate release or extended release they state that the observation period for immediately since 4 hours and extended release is 8 hours. Care is supportive  Patient is spontaneously awake but drowsy [CB]    Clinical Course User Index [CB] Gwenevere Abbot, PA-C                             Medical Decision Making Presents for concern of suicidal thoughts and having taking 10-15 Seroquel unknown dosage and known if it is extended release or immediate release about an hour prior to arrival.  Differential diagnose includes suicidal ideation, overdose, depression, anxiety, other  Co morbidities that complicate the patient evaluation MDD   Additional history obtained:  Additional history obtained from EMR External records from outside source obtained and reviewed including discharge from this morning   Lab Tests:  I Ordered, and personally interpreted labs.  The pertinent results include: Reassuring CBC, CMP, lipase, Tylenol salicylate and lithium levels including repeat labs      Consultations Obtained:  I requested consultation with the poison center,  and discussed lab and imaging findings as well as pertinent plan - they recommend: Noted in clinical course but advised supportive care, repeat labs in 4 hours and can be cleared by 8 hours   Problem List / ED Course /  Critical interventions / Medication management Patient is here for suicidal thoughts, will be medically cleared around 10 PM and can be evaluated by TTS or suicidal thoughts he is voluntary        Amount and/or Complexity of Data Reviewed Labs: ordered.   Signed out to oncoming provider        Final Clinical Impression(s) / ED Diagnoses Final diagnoses:  None    Rx / DC Orders ED Discharge Orders          Ordered    Lithium level        07/21/22  [address]              Darci Current 07/21/22 1910    Davonna Belling, MD 07/22/22 [phone]

## 2022-07-21 NOTE — Consult Note (Signed)
Telepsych Consultation   Reason for Consult:  Suicidal ideation Referring Physician:  Dr Tyrone Nine Location of Patient: [place] [facility] Location of Provider: [place] [facility]  Patient Identification: Jon Clayton MRN:  [id number] Principal Diagnosis: Homeless Diagnosis:  Principal Problem:   Homeless   Total Time spent with patient: 30 minutes  Subjective:   Jon Clayton is a 27 y.o. male patient admitted with suicidal ideation.  Patient states "I want to stay sober but being on the street it is hard to stay sober.  The earliest I can go back to rehab at [facility] in [facility] is Monday."  HPI: Patient is reassessed, virtually, via telepsychiatry monitor, by nurse practitioner.  Chart reviewed and patient discussed with Dr. [place] Abbot on 07/21/2022.  Patient is visualized seated on hospital stretcher, no apparent distress.  He is alert and oriented, pleasant and cooperative during assessment.  Patient presents with euthymic mood, congruent affect.  Jon Clayton denies suicidal and homicidal ideation.  He easily contracts verbally for safety with this Probation officer.  He denies auditory and visual hallucination.  There is no evidence of delusional thought content and no indication that patient is responding to internal stimuli.  Patient reports he is followed by outpatient psychiatry through continuing care services in [facility].  He is able to meet with medication management provider monthly however he meets with counselor rarely and would like to be linked with alternative outpatient individual counseling/talk therapy.  He reports he has not been compliant with medications, current medications include Depakote.  Jon Clayton endorses multiple previous inpatient psychiatric hospitalizations.  Most recently hospitalized for a 5-day inpatient stay at [facility], discharged on yesterday.  Medications updated at [facility] to include only Depakote, he reports several other medications were  discontinued.  He is unable to recall previous medications.  Patient has a history of polysubstance use disorder.  He reports he has been completely sober for 7 days.  Most recently he used alcohol 7 days ago also used methamphetamine 7 days ago.  He is concerned that methamphetamine may have contained some other substance.  He remains committed to his sobriety and plans to seek residential substance use treatment at [facility] point, he has an intake appointment on Monday 07/25/2022.  Jon Clayton is currently homeless in [place].  Patient denies access to weapons.  Through [facility] care services in [facility] he is on a list to be placed in subsidized housing.  He is excited to say he has #31 on the list and will soon be offered housing.  He is actively seeking employment and has placed many applications.  He endorses average sleep and appetite.  Patient offered support and encouragement.  He declines any person to contact for collateral information at this time.  He verbalizes understanding of strict return precautions.   Patient educated and verbalizes understanding of mental health resources and other crisis services in the community. They are instructed to call 911 and present to the nearest emergency room should patient experience any suicidal/homicidal ideation, auditory/visual/hallucinations, or detrimental worsening of mental health condition.       Past Psychiatric History: PTSD, suicidal ideation, major depressive disorder, recurrent severe with psychosis, polysubstance use disorder, cannabis use disorder, suicidal behavior, amphetamine substance use disorder, history of heroin use  Risk to Self:  Denies Risk to Others:  Denies Prior Inpatient Therapy:  Patient endorses multiple inpatient psychiatric admissions, most recent admission at [facility], discharged from [facility] on yesterday. Prior Outpatient Therapy:  Currently linked  with outpatient provider, prefers follow-up at  [facility] behavioral health outpatient, resources provided  Past Medical History:  Past Medical History:  Diagnosis Date   Depression    Psychosis (HCC)    PTSD (post-traumatic stress disorder)     Past Surgical History:  Procedure Laterality Date   NASAL SINUS SURGERY Bilateral    TONSILLECTOMY     Family History: History reviewed. No pertinent family history. Family Psychiatric  History: None reported Social History:  Social History   Substance and Sexual Activity  Alcohol Use Not Currently   Comment: reports periodic alcohol use     Social History   Substance and Sexual Activity  Drug Use Yes   Frequency: 1.0 times per week   Types: Marijuana   Comment: 1/3 gm daily    Social History   Socioeconomic History   Marital status: Single    Spouse name: Not on file   Number of children: Not on file   Years of education: Not on file   Highest education level: 8th grade  Occupational History   Not on file  Tobacco Use   Smoking status: Every Day    Packs/day: 1.00    Years: 11.00    Total pack years: 11.00    Types: Cigarettes   Smokeless tobacco: Never  Vaping Use   Vaping Use: Never used  Substance and Sexual Activity   Alcohol use: Not Currently    Comment: reports periodic alcohol use   Drug use: Yes    Frequency: 1.0 times per week    Types: Marijuana    Comment: 1/3 gm daily   Sexual activity: Not Currently  Other Topics Concern   Not on file  Social History Narrative   Not on file   Social Determinants of Health   Financial Resource Strain: Not on file  Food Insecurity: Not on file  Transportation Needs: Not on file  Physical Activity: Not on file  Stress: Not on file  Social Connections: Not on file   Additional Social History:    Allergies:   Allergies  Allergen Reactions   Amoxicillin Anaphylaxis and Rash   Penicillins Anaphylaxis and Rash   Other Swelling and Other (See Comments)    Pt said he is allergic to tapioca. Causes  big knots to appear on his tongue.   Pertussis Vaccines Hives and Other (See Comments)    Ped. MD said he was allergic to a medication in the vaccine.   Blue Mussel [Mytilus] Swelling, Rash and Other (See Comments)    Itchy throat    Labs:  Results for orders placed or performed during the hospital encounter of 07/20/22 (from the past 48 hour(s))  Rapid urine drug screen (hospital performed)     Status: None   Collection Time: 07/20/22  2:31 PM  Result Value Ref Range   Opiates NONE DETECTED NONE DETECTED   Cocaine NONE DETECTED NONE DETECTED   Benzodiazepines NONE DETECTED NONE DETECTED   Amphetamines NONE DETECTED NONE DETECTED   Tetrahydrocannabinol NONE DETECTED NONE DETECTED   Barbiturates NONE DETECTED NONE DETECTED    Comment: (NOTE) DRUG SCREEN FOR MEDICAL PURPOSES ONLY.  IF CONFIRMATION IS NEEDED FOR ANY PURPOSE, NOTIFY LAB WITHIN 5 DAYS.  LOWEST DETECTABLE LIMITS FOR URINE DRUG SCREEN Drug Class                     Cutoff (ng/mL) Amphetamine and metabolites    1000 Barbiturate and metabolites    200 Benzodiazepine  200 Opiates and metabolites        300 Cocaine and metabolites        300 THC                            50 Performed at [facility], [address] [place], [place] [address]   Comprehensive metabolic panel     Status: Abnormal   Collection Time: 07/20/22  3:00 PM  Result Value Ref Range   Sodium 135 135 - 145 mmol/L   Potassium 3.7 3.5 - 5.1 mmol/L   Chloride 102 98 - 111 mmol/L   CO2 25 22 - 32 mmol/L   Glucose, Bld 164 (H) 70 - 99 mg/dL    Comment: Glucose reference range applies only to samples taken after fasting for at least 8 hours.   BUN 18 6 - 20 mg/dL   Creatinine, Ser 1.06 0.61 - 1.24 mg/dL   Calcium 8.4 (L) 8.9 - 10.3 mg/dL   Total Protein 6.9 6.5 - 8.1 g/dL   Albumin 4.2 3.5 - 5.0 g/dL   AST 24 15 - 41 U/L   ALT 12 0 - 44 U/L   Alkaline Phosphatase 47 38 - 126 U/L   Total Bilirubin 0.5 0.3 - 1.2 mg/dL    GFR, Estimated >60 >60 mL/min    Comment: (NOTE) Calculated using the CKD-EPI Creatinine Equation (2021)    Anion gap 8 5 - 15    Comment: Performed at [facility], [address] [place], [place] [address]  Ethanol     Status: None   Collection Time: 07/20/22  3:00 PM  Result Value Ref Range   Alcohol, Ethyl (B) <10 <10 mg/dL    Comment: (NOTE) Lowest detectable limit for serum alcohol is 10 mg/dL.  For medical purposes only. Performed at [facility], [address] [place], [place] [address]   Salicylate level     Status: Abnormal   Collection Time: 07/20/22  3:00 PM  Result Value Ref Range   Salicylate Lvl <9.3 (L) 7.0 - 30.0 mg/dL    Comment: Performed at [facility], [address] [place], [place] [address]  Acetaminophen level     Status: Abnormal   Collection Time: 07/20/22  3:00 PM  Result Value Ref Range   Acetaminophen (Tylenol), Serum <10 (L) 10 - 30 ug/mL    Comment: (NOTE) Therapeutic concentrations vary significantly. A range of 10-30 ug/mL  may be an effective concentration for many patients. However, some  are best treated at concentrations outside of this range. Acetaminophen concentrations >150 ug/mL at 4 hours after ingestion  and >50 ug/mL at 12 hours after ingestion are often associated with  toxic reactions.  Performed at [facility], [address] [place], [place] [address]   cbc     Status: Abnormal   Collection Time: 07/20/22  3:00 PM  Result Value Ref Range   WBC 10.9 (H) 4.0 - 10.5 K/uL   RBC 4.68 4.22 - 5.81 MIL/uL   Hemoglobin 13.8 13.0 - 17.0 g/dL   HCT 41.9 39.0 - 52.0 %   MCV 89.5 80.0 - 100.0 fL   MCH 29.5 26.0 - 34.0 pg   MCHC 32.9 30.0 - 36.0 g/dL   RDW 13.5 11.5 - 15.5 %   Platelets 261 150 - 400 K/uL   nRBC 0.0 0.0 - 0.2 %  Comment: Performed at [facility], [address] [place], [place] [address]  Lithium level     Status: Abnormal   Collection Time:  07/21/22  7:41 AM  Result Value Ref Range   Lithium Lvl <0.06 (L) 0.60 - 1.20 mmol/L    Comment: Performed at [facility] [address] [place], [place] [address]    Medications:  Current Facility-Administered Medications  Medication Dose Route Frequency Provider Last Rate Last Admin   acetaminophen (TYLENOL) tablet 650 mg  650 mg Oral Q4H PRN Hayden Rasmussen, MD       albuterol (VENTOLIN HFA) 108 (90 Base) MCG/ACT inhaler 2 puff  2 puff Inhalation Q6H PRN Hayden Rasmussen, MD       divalproex (DEPAKOTE) DR tablet 500 mg  500 mg Oral BID Hayden Rasmussen, MD   500 mg at 07/20/22 2337   nicotine (NICODERM CQ - dosed in mg/24 hours) patch 21 mg  21 mg Transdermal Daily Hayden Rasmussen, MD       OLANZapine [facility]) tablet 7.5 mg  7.5 mg Oral QHS Hayden Rasmussen, MD       Current Outpatient Medications  Medication Sig Dispense Refill   albuterol (VENTOLIN HFA) 108 (90 Base) MCG/ACT inhaler Inhale 2 puffs into the lungs every 6 (six) hours as needed for wheezing or shortness of breath. 6.7 g 0   divalproex (DEPAKOTE) 500 MG DR tablet Take 1 tablet (500 mg total) by mouth 2 (two) times daily. 60 tablet 0   hydrOXYzine (VISTARIL) 25 MG capsule Take 1 capsule (25 mg total) by mouth every 6 (six) hours as needed for anxiety. 30 capsule 0   lithium carbonate (LITHOBID) 300 MG ER tablet Take 1 tablet (300 mg total) by mouth 3 (three) times daily. 90 tablet 0   OLANZapine (ZYPREXA) 7.5 MG tablet Take 1 tablet (7.5 mg total) by mouth at bedtime. 30 tablet 0    Musculoskeletal: Strength & Muscle Tone: within normal limits Gait & Station: normal Patient leans: N/A          Psychiatric Specialty Exam:  Presentation  General Appearance:  Appropriate for Environment; Casual; Fairly Groomed  Eye Contact: Good  Speech: Clear and Coherent; Normal Rate  Speech Volume: Normal  Handedness: Right   Mood and Affect  Mood: Dysphoric  Affect: Appropriate;  Congruent; Full Range   Thought Process  Thought Processes: Coherent; Goal Directed; Linear  Descriptions of Associations:Intact  Orientation:Full (Time, Place and Person)  Thought Content:Logical; WDL  History of Schizophrenia/Schizoaffective disorder:Yes  Duration of Psychotic Symptoms:Greater than six months  Hallucinations:No data recorded Ideas of Reference:None  Suicidal Thoughts:No data recorded Homicidal Thoughts:No data recorded  Sensorium  Memory: Immediate Good  Judgment: Fair  Insight: Fair   Community education officer  Concentration: Good  Attention Span: Good  Recall: Good  Fund of Knowledge: Good  Language: Good   Psychomotor Activity  Psychomotor Activity:No data recorded  Assets  Assets: Communication Skills; Desire for Improvement; Resilience; Leisure Time; Physical Health   Sleep  Sleep:No data recorded   Physical Exam: Physical Exam Vitals and nursing note reviewed.  Constitutional:      Appearance: Normal appearance. He is normal weight.  HENT:     Head: Normocephalic and atraumatic.     Nose: Nose normal.  Cardiovascular:     Rate and Rhythm: Normal rate.  Pulmonary:     Effort: Pulmonary effort is normal.  Musculoskeletal:  General: Normal range of motion.     Cervical back: Normal range of motion.  Skin:    General: Skin is warm and dry.  Neurological:     Mental Status: He is alert and oriented to person, place, and time.  Psychiatric:        Attention and Perception: Attention and perception normal.        Mood and Affect: Mood and affect normal.        Speech: Speech normal.        Behavior: Behavior normal. Behavior is cooperative.        Thought Content: Thought content normal.        Cognition and Memory: Cognition and memory normal.    Review of Systems  Constitutional: Negative.   HENT: Negative.    Eyes: Negative.   Respiratory: Negative.    Cardiovascular: Negative.   Gastrointestinal:  Negative.   Genitourinary: Negative.   Musculoskeletal: Negative.   Skin: Negative.   Neurological: Negative.   Psychiatric/Behavioral: Negative.     Blood pressure 90/68, pulse 69, temperature 97.8 F (36.6 C), temperature source Oral, resp. rate 18, height 5\' 7"  (1.702 m), weight 71.7 kg, SpO2 99 %. Body mass index is 24.75 kg/m.  Treatment Plan Summary: Follow-up with outpatient psychiatry, resources provided.  Continue current medications. Follow-up with shelter resources provided.   Disposition: No evidence of imminent risk to self or others at present.   Patient does not meet criteria for psychiatric inpatient admission. Supportive therapy provided about ongoing stressors. Discussed crisis plan, support from social network, calling 911, coming to the Emergency Department, and calling Suicide Hotline.  This service was provided via telemedicine using a 2-way, interactive audio and video technology.  Names of all persons participating in this telemedicine service and their role in this encounter. Name: Role: Patient  Name: [name] NP  Name: Dr. Doran Heater Role: MD  Name: Dr. Adela Lank Role: Psychiatrist    Nelly Rout, FNP 07/21/2022 10:32 AM

## 2022-07-21 NOTE — ED Notes (Signed)
Provided pt with his medication that were located in the pyxis. Pt was thankful for medications and inhaler.

## 2022-07-21 NOTE — ED Notes (Signed)
Pt is calm and pleasant, no confusion noted.  Resting in bed

## 2022-07-21 NOTE — Progress Notes (Signed)
Inpatient Behavioral Health Placement  Pt meets inpatient criteria per Earney Mallet, NP. There are no available beds at [facility] per [place], RN.  Referral was sent to the following facilities;    Destination  Service Provider Address Phone Fax  [facility]  [address] [place] [address] [place]  [facility]  [address] [place] [address] [place]  [facility]  [address], [place] [address] [phone]  [facility]  [place], [place] [address] [phone]  [facility]  [address] [place] [address] [phone] [facility]  [address], [place] [address] [phone] [facility]  [address] [place], [place] [address] [phone]  [facility]  [place] [address] [phone] [facility]  [address], [place] [address] [phone] [facility]  [address] [place] [address] [phone] [facility]  [address] [place] [address] [place]  [address] [place] [address] [phone]  [facility]  [address] [place] [address] [place]  [facility]  [address] [place] [address] [phone]  [address] [place] [address] [phone] [facility]  [address] [place] [address] [phone] [facility]  [place], [place] [address] [phone]   Situation ongoing,  CSW will follow up.   Benjaman Kindler, MSW, [facility] 07/21/2022  @ 10:39 PM

## 2022-07-21 NOTE — ED Triage Notes (Signed)
Pt came in [facility] from [facility] dropping him for psychiatric evaluation d/t pt stating he is in "no mans land" in his mind. Also endorses being very paranoid & states he takes Seroquel that helps "stop the thoughts." Pt denies HI but was just recently d/c for SI that he said he continues to have. Pt very polite & cooperative in triage.

## 2022-07-21 NOTE — ED Notes (Signed)
Patient is speaking with Tele Psych in room at this time.

## 2022-07-21 NOTE — ED Provider Notes (Signed)
Patient care taken over at shift handoff from outgoing provider.  Past Medical History:  Diagnosis Date   Depression    Psychosis ([place])    PTSD (post-traumatic stress disorder)    Past Surgical History:  Procedure Laterality Date   NASAL SINUS SURGERY Bilateral    TONSILLECTOMY      Physical Exam  BP 105/71   Pulse 78   Temp (!) 97.3 F (36.3 C) (Oral)   Resp 15   Ht 5' 7"$  (1.702 m)   Wt 71.7 kg   SpO2 95%   BMI 24.75 kg/m   Physical Exam Vitals and nursing note reviewed.  Constitutional:      Appearance: Normal appearance.  HENT:     Head: Normocephalic and atraumatic.     Mouth/Throat:     Mouth: Mucous membranes are moist.  Eyes:     General: No scleral icterus. Cardiovascular:     Rate and Rhythm: Normal rate and regular rhythm.     Pulses: Normal pulses.     Heart sounds: Normal heart sounds.  Pulmonary:     Effort: Pulmonary effort is normal.     Breath sounds: Normal breath sounds.  Abdominal:     General: Abdomen is flat.     Palpations: Abdomen is soft.     Tenderness: There is no abdominal tenderness.  Musculoskeletal:        General: No deformity.  Skin:    General: Skin is warm.     Findings: No rash.  Neurological:     General: No focal deficit present.     Mental Status: He is alert.  Psychiatric:        Mood and Affect: Mood normal.     Comments: Patient appears calm and cooperative.     Procedures  Procedures  ED Course / MDM   Clinical Course as of 07/21/22 2038  Thu Jul 21, 2022  1639 Spoke with [facility] poison control.  They recommend a 4-hour repeat Tylenol salicylate level and CMP.  If we are able to find out if this was immediate release or extended release they state that the observation period for immediately since 4 hours and extended release is 8 hours. Care is supportive  Patient is spontaneously awake but drowsy [CB]    Clinical Course User Index [CB] Gwenevere Abbot, PA-C   Medical Decision Making Amount  and/or Complexity of Data Reviewed Labs: ordered.   27 year old male history of depression, PTSD presenting for suicidal ideation with a plan to step on traffic.  No [place] Poison control was contacted by previous provider. Tylenol and salicylate levels and CMP level after 4 hours were unremarkable.  Will place patient on observation for 8 hours for medical clearance and will consult TTS. 8:51 PM Patient appears stable at this point.  He denies chest pain, shortness of breath.  He reports mild blurry vision but has felt significantly better compared to when he first took the Seroquel. Will observe patient for another hour, medically clear and consult TTS. 9:45 PM Pt is medically cleared and placed on psych hold awaiting TTS consult.      Rex Kras, PA 07/22/22 [id number]    Audley Hose, MD 07/23/22 [phone]

## 2022-07-21 NOTE — Progress Notes (Signed)
Pt was accepted to [facility] 07/22/22; Bed Assignment [place]  Pt meets inpatient criteria per Earney Mallet, NP  Attending Physician will be Dr. Mardelle Matte   Report can be called to: [phone]  Pt can arrive after 9:00am  Care Team Notified: Mayra Neer, RN, [address] [place], [place] 07/21/2022 @ 10:46 PM

## 2022-07-21 NOTE — ED Notes (Signed)
Pt is resting, calm still states he is depressed and suicidal. Posing no threat at this time.

## 2022-07-21 NOTE — ED Notes (Signed)
Checked pyxis to see if pt had any meds, pt has medsin there from his visit on 06/03/22. Inhaler, Depakote, Zoloft, Seroquel and trazodone.  Pt is currently not on any of the meds except for Depakote

## 2022-07-21 NOTE — ED Notes (Signed)
Pt in bed, pt in front of nurses station, charge rn notified of need for sitter.

## 2022-07-21 NOTE — ED Notes (Signed)
Pt sleeping. 

## 2022-07-21 NOTE — ED Notes (Signed)
Pt noted to be resting this morning. Pt voiced no complaints of pain at this time. Pt was given something to drink and a snack. Pt cooperative while getting labs drawn this morning.

## 2022-07-21 NOTE — Discharge Instructions (Addendum)
Base on the information you have provided and the presenting issue, outpatient services and resources for shelter resources and outpatient mental health resources have been recommended.  It is imperative that you follow through with treatment recommendations within 5-7 days from the of discharge to mitigate further risk to your safety and mental well-being. A list of referrals has been provided below to get you started.  You are not limited to the list provided.  In case of an urgent crisis, you may contact the Mobile Crisis Unit with [facility] at [phone].  Outpatient Services for Therapy and Medication Management    [facility] [place], [place], [address] [phone] phone  New Patient Assessment/Therapy Walk-ins Monday and Wednesday: 8am until slots are full. Every 1st and 2nd Friday: 1pm - 5pm  NO ASSESSMENT/THERAPY WALK-INS ON [place]  New Patient Psychiatry/Medication Management Walk-ins Monday-Friday: 8am-11am  For all walk-ins, we ask that you arrive by 7:30am because patient will be seen in the order of arrival.  Availability is limited; therefore, you may not be seen on the same day that you walk-in.  Our goal is to serve and meet the needs of our community to the best of our ability.   [facility] [address], [place], [place], [address] [phone] phone  [facility], [place], [place], [address] [phone] phone [phone] phone ([address] AmeriHealth, Anthem/Elevance, BCBS, [place], [place], ComPsych, Healthy [place], [place], [place], [place], [place], Southern Company, [place], Out of Network)  [employer], [place] [place], [place], [address] [phone] phone ([place], Anthem/Elevance, [employer] Options/[place], [place], [place], [place], [place], [place], [employer] Company, [place], [place], [place], [facility])  [place] [address] [place], [place], [address] [phone] phone (Medicaid, ask about other insurance)  [facility] [address], [place], [place], [address] [phone] phone [phone] fax ([address] [place] , [place], [place], [place], UHC, [place]im's Pride, Self-Pay)  Evangeline Dakin [address]. [place], [place], [address] [phone] phone ([address] Anthem/Elevance, [place], [place], [place], [place], [place], [employer] Company, [place], [place], [place], [facility])  [place] - 6-8 MONTH WAIT FOR THERAPY; [facility] [address] [place], [place], [address] [phone] phone ([address], , [place], [place], [place], Friday Health Plans, [place], [place], [place], [place], [place], [place], Tricare, UHC, The [employer] Companies, [place])  [facility] [address] [place], [place], [address] [phone] phone  [place] [address] [place], [place], [address] [phone] phone  [facility] [address], [place], [place], [address] [phone] phone  [facility] [facility] [address] [place], [address] [phone] phone  [facility], [place] [address], [place], [address] [phone] phone  [facility] [place] [place], [place], [address] [phone] phone [phone] fax  [facility] [address] [place], [place], [address] [phone] phone [phone] fax  [facility] [phone] [place], [place], [address] [phone] phone  Jinny Blossom [address] [place], [place], [address]   [phone] phone  [facility]  (Adults Only) [address] [place], [place], [address]  [phone] phone [phone] fax  Homeless Shelter Resources   [facility]          (Please Call) Phone: [phone]     [place], [place] Phone: [place] has been providing emergency shelter to those in need of a permanent residence for over 35 years. The [employer] shelter plays an important role in our community.   There are many life events that can pull someone into a downward spiral towards poverty that is very difficult to get out of. Homelessness is a problem that can affect anyone of [place]. [facility] [facility] is a safe and comforting place to stay, especially if you have experienced the hardship of street life.   [facility] provides a single bed and bedding to 100 adult men and women. The shelter welcomes all who are in need of housing, no one in real need is turned away unless space is not available.   While staying at [facility], guests are offered more than just a bed for a night. Hot meals are provided and every guest has access to case management services. Case managers provide assistance with finding housing, employment, or other services that will help them gain stability. Continuous stay is based on availability, capacity, and progress towards goals.   To contact the front desk of [facility] please call   [phone] ext [phone] or ext. [phone].   [facility] [address], [place], [place] [address] Phone: [phone]   [facility] (Women only) [address] [place], [place] [address] Phone: [phone]   [facility] Address: [address] [place], [place] [address] Hours:   Opens 9?AM Mon Phone: [phone]  [facility] providers a continuum of housing services to  those experiencing homelessness. They provide transitional [employer] and permanent supportive housing ([place] and [employer]) to disabled veterans experiencing homelessness. There is a fast track Rapid Rehousing program couples housing stability services with temporary financial assistance to quickly house individuals and families experiencing homelessness.   [address], [place] [address] Phone: [phone]   [place]. Sharyn Lull [place], [place] [address] Phone: [place] [address] [place], [place] [address] Check in at 6:00PM for placement at a local shelter) Phone: [phone]   [facility] Eligibility:  Must be drug and alcohol free for at least 14 days or more at the time of application. This program serves males.  Houses Architectural technologist, Futures trader who serve six-month terms.  Houses are financially self-supporting; members split house expenses, which average $90.00 to $130.00 per person per week.  Any Resident who relapses must be immediately expelled. Call:  [place]  Address: [place], [place], [place] [address]  Phone#: [phone] [facility] Address: [address] [place], [place] [address] Phone: [phone]  [facility] provides food, shelter and other programs and services to the homeless men of [place] through our [employer] program.  By offering safe shelter, three meals a day, clean clothing, Biblical counseling, financial planning, vocational training, GED/education and employment assistance, we've helped mend the shattered lives of many homeless men since opening in [place].  We have approximately 267 beds available, with a max of 312 beds including mats for emergency situations  and currently house an average of 270  men a night.  Prospective Client Check-In Information Photo ID Required (State/ Out of State/ [facility]) - if photo ID is not available, clients are required to have a printout of a police/sheriff's criminal history report. Help out with chores around the [place]. No sex offender of any type (pending, charged, registered and/or any other sex related offenses) will be permitted to check in. Must be willing to abide by all rules, regulations, and policies established by the [employer]. The following will be provided - shelter, food, clothing, and biblical counseling. If you or someone you know is in need of assistance at our [facility] shelter in [place], [place], please call [phone] ext. [phone].  Women Shelter for [employer] hours are Monday-Friday only.

## 2022-07-21 NOTE — ED Notes (Signed)
Pts belongings locked up in locker #3, was not inventoried.

## 2022-07-21 NOTE — ED Notes (Signed)
Pt clothing returned to patient. Pt also requested some ginger ale and crackers to take home with him.    Pt understood discharge instructions

## 2022-07-21 NOTE — ED Notes (Signed)
Psyc np at bedside

## 2022-07-21 NOTE — ED Provider Notes (Addendum)
I sent care of the patient at 0 700, plan was for reassessment in the morning.  I evaluated the patient's medications and saw that he was on lithium and lithium level was checked.  This was checked here and is completely negative consistent with noncompliance.  Awaiting TTS reeval.    The patient has been psychiatrically cleared.  Discharge home.     Deno Etienne, DO 07/21/22 1049

## 2022-07-22 LAB — RESP PANEL BY RT-PCR (RSV, FLU A&B, COVID)  RVPGX2
Influenza A by PCR: NEGATIVE
Influenza B by PCR: NEGATIVE
Resp Syncytial Virus by PCR: NEGATIVE
SARS Coronavirus 2 by RT PCR: NEGATIVE

## 2022-07-22 MED ORDER — DIVALPROEX SODIUM 500 MG PO DR TAB
500.0000 mg | DELAYED_RELEASE_TABLET | Freq: Two times a day (BID) | ORAL | Status: DC
  Administered 2022-07-22: 500 mg via ORAL
  Filled 2022-07-22: qty 1

## 2022-07-22 MED ORDER — LITHIUM CARBONATE ER 300 MG PO TBCR
300.0000 mg | EXTENDED_RELEASE_TABLET | Freq: Three times a day (TID) | ORAL | Status: DC
  Administered 2022-07-22: 300 mg via ORAL
  Filled 2022-07-22: qty 1

## 2022-07-22 MED ORDER — OLANZAPINE 5 MG PO TABS
5.0000 mg | ORAL_TABLET | Freq: Every morning | ORAL | Status: DC
  Administered 2022-07-22: 5 mg via ORAL
  Filled 2022-07-22: qty 1

## 2022-07-22 MED ORDER — ALBUTEROL SULFATE HFA 108 (90 BASE) MCG/ACT IN AERS: 2.0000 | INHALATION_SPRAY | Freq: Four times a day (QID) | RESPIRATORY_TRACT | Status: DC | PRN

## 2022-07-22 MED ORDER — HYDROXYZINE HCL 10 MG PO TABS: 25.0000 mg | ORAL_TABLET | Freq: Four times a day (QID) | ORAL | Status: DC | PRN

## 2022-07-22 MED ORDER — TRAZODONE HCL 50 MG PO TABS: 50.0000 mg | ORAL_TABLET | Freq: Every evening | ORAL | Status: DC | PRN

## 2022-07-22 MED ORDER — ALBUTEROL SULFATE (2.5 MG/3ML) 0.083% IN NEBU: 2.5000 mg | INHALATION_SOLUTION | Freq: Four times a day (QID) | RESPIRATORY_TRACT | Status: DC | PRN

## 2022-07-22 MED ORDER — OLANZAPINE 5 MG PO TABS: 15.0000 mg | ORAL_TABLET | Freq: Every day | ORAL | Status: DC

## 2022-07-22 NOTE — ED Notes (Signed)
Attempted to call report to [facility]. Phone number left on designated pager at this time.

## 2022-07-22 NOTE — ED Notes (Signed)
[facility] called. Pt to [facility], NO ETA.

## 2022-07-22 NOTE — ED Provider Notes (Signed)
Emergency Medicine Observation Re-evaluation Note  Jon Clayton is a 27 y.o. male, seen on rounds today.  Pt initially presented to the ED for complaints of No chief complaint on file. Currently, the patient is resting.  Physical Exam  BP 106/73 (BP Location: Right Arm)   Pulse 66   Temp (!) 97.5 F (36.4 C) (Oral)   Resp 18   Ht 5' 7"$  (1.702 m)   Wt 71.7 kg   SpO2 98%   BMI 24.75 kg/m  Physical Exam General: no acute distress Lungs: normal effort Psych: suicidal  ED Course / MDM  EKG:EKG Interpretation  Date/Time:  Friday July 22 2022 03:59:19 EST Ventricular Rate:  89 PR Interval:  140 QRS Duration: 95 QT Interval:  390 QTC Calculation: 475 R Axis:   44 Text Interpretation: Sinus rhythm Abnormal T, consider ischemia, diffuse leads When compared with ECG of 07/21/2022, ECGis drastically different - suspect this is on a different patient Confirmed by Delora Fuel ([id number]) on 07/22/2022 5:21:07 AM  I have reviewed the labs performed to date as well as medications administered while in observation.  Recent changes in the last 24 hours include home meds ordered this morning.  Plan  Current plan is for inpatient psychiatric admission.    Sherwood Gambler, MD 07/22/22 0830

## 2022-07-22 NOTE — ED Notes (Signed)
No return call from [facility]. Called pager and left a voice message at this time.

## 2022-07-22 NOTE — ED Provider Notes (Signed)
Patient accepted to [facility]. Accepting Physician is Dr. Burnis Medin, MD 07/22/22 1302

## 2022-07-22 NOTE — ED Notes (Signed)
Voluntary per chart docs

## 2022-08-15 ENCOUNTER — Encounter: Payer: Self-pay | Admitting: Physician Assistant

## 2022-08-15 ENCOUNTER — Ambulatory Visit: Payer: Medicaid Other | Admitting: Physician Assistant

## 2022-08-15 ENCOUNTER — Other Ambulatory Visit (HOSPITAL_COMMUNITY)
Admission: RE | Admit: 2022-08-15 | Discharge: 2022-08-15 | Disposition: A | Discharge status: Home / Self Care | Payer: Medicaid Other | Source: Ambulatory Visit | Attending: Physician Assistant | Admitting: Physician Assistant

## 2022-08-15 VITALS — BP 114/63 | HR 83 | Ht 67.0 in | Wt 186.0 lb

## 2022-08-15 DIAGNOSIS — Z1159 Encounter for screening for other viral diseases: Secondary | ICD-10-CM

## 2022-08-15 DIAGNOSIS — Z113 Encounter for screening for infections with a predominantly sexual mode of transmission: Secondary | ICD-10-CM | POA: Insufficient documentation

## 2022-08-15 DIAGNOSIS — Z720 Tobacco use: Secondary | ICD-10-CM | POA: Insufficient documentation

## 2022-08-15 DIAGNOSIS — K0889 Other specified disorders of teeth and supporting structures: Secondary | ICD-10-CM | POA: Diagnosis not present

## 2022-08-15 DIAGNOSIS — R946 Abnormal results of thyroid function studies: Secondary | ICD-10-CM | POA: Diagnosis not present

## 2022-08-15 DIAGNOSIS — F17210 Nicotine dependence, cigarettes, uncomplicated: Secondary | ICD-10-CM

## 2022-08-15 DIAGNOSIS — E559 Vitamin D deficiency, unspecified: Secondary | ICD-10-CM | POA: Diagnosis not present

## 2022-08-15 DIAGNOSIS — F3170 Bipolar disorder, currently in remission, most recent episode unspecified: Secondary | ICD-10-CM | POA: Insufficient documentation

## 2022-08-15 DIAGNOSIS — F1510 Other stimulant abuse, uncomplicated: Secondary | ICD-10-CM

## 2022-08-15 DIAGNOSIS — F1210 Cannabis abuse, uncomplicated: Secondary | ICD-10-CM

## 2022-08-15 DIAGNOSIS — Z9189 Other specified personal risk factors, not elsewhere classified: Secondary | ICD-10-CM

## 2022-08-15 DIAGNOSIS — F1110 Opioid abuse, uncomplicated: Secondary | ICD-10-CM

## 2022-08-15 DIAGNOSIS — F1310 Sedative, hypnotic or anxiolytic abuse, uncomplicated: Secondary | ICD-10-CM

## 2022-08-15 MED ORDER — METRONIDAZOLE 500 MG PO TABS: 500.0000 mg | ORAL_TABLET | Freq: Three times a day (TID) | ORAL | 0 refills | Status: AC

## 2022-08-15 MED ORDER — LEVOFLOXACIN 750 MG PO TABS: 750.0000 mg | ORAL_TABLET | Freq: Every day | ORAL | 0 refills | Status: AC

## 2022-08-15 NOTE — Progress Notes (Unsigned)
New Patient Office Visit  Subjective    Patient ID: Jon Clayton, male    DOB: 1996-05-08  Age: 27 y.o. MRN: [id number]  CC:  Chief Complaint  Patient presents with   Oral Pain    Left side, pain radiating to ear.     HPI Jon Clayton states that he is currently being treated for substance abuse at [facility].  States that he arrived 2 weeks ago, does not have any aftercare plans as of yet.  States that he has been having pain on the top left of his jaw, states he has several broken teeth in the area, states the pain has been present over the past few months but worse in the past couple weeks.  States that he has been using Tylenol and Orajel without relief.  States that last visit to dentistry was in 2011.  States that mood is improving, sleep is good, appetite is good.  Denies any adverse effects from current medication regimen.  Does request STD testing, denies any known symptoms or any known exposure.    Outpatient Encounter Medications as of 08/15/2022  Medication Sig   divalproex (DEPAKOTE) 500 MG DR tablet Take 1,000 mg by mouth at bedtime.   prazosin (MINIPRESS) 2 MG capsule Take 2 mg by mouth at bedtime.   QUEtiapine (SEROQUEL) 100 MG tablet Take 100 mg by mouth at bedtime.   albuterol (VENTOLIN HFA) 108 (90 Base) MCG/ACT inhaler Inhale 2 puffs into the lungs every 6 (six) hours as needed for wheezing or shortness of breath.   traZODone (DESYREL) 50 MG tablet Take 50 mg by mouth at bedtime as needed for sleep.   [DISCONTINUED] divalproex (DEPAKOTE) 500 MG DR tablet Take 1 tablet (500 mg total) by mouth 2 (two) times daily.   [DISCONTINUED] hydrOXYzine (VISTARIL) 25 MG capsule Take 1 capsule (25 mg total) by mouth every 6 (six) hours as needed for anxiety.   [DISCONTINUED] lithium carbonate (LITHOBID) 300 MG ER tablet Take 1 tablet (300 mg total) by mouth 3 (three) times daily.   [DISCONTINUED] OLANZapine (ZYPREXA) 15 MG tablet Take 15 mg by mouth at  bedtime.   [DISCONTINUED] OLANZapine (ZYPREXA) 5 MG tablet Take 5 mg by mouth in the morning.   [DISCONTINUED] OLANZapine (ZYPREXA) 7.5 MG tablet Take 1 tablet (7.5 mg total) by mouth at bedtime. (Patient not taking: Reported on 07/21/2022)   No facility-administered encounter medications on file as of 08/15/2022.    Past Medical History:  Diagnosis Date   Depression    Psychosis ([place])    PTSD (post-traumatic stress disorder)     Past Surgical History:  Procedure Laterality Date   NASAL SINUS SURGERY Bilateral    TONSILLECTOMY      No family history on file.  Social History   Socioeconomic History   Marital status: Single    Spouse name: Not on file   Number of children: Not on file   Years of education: Not on file   Highest education level: 8th grade  Occupational History   Not on file  Tobacco Use   Smoking status: Every Day    Packs/day: 1.00    Years: 11.00    Total pack years: 11.00    Types: Cigarettes   Smokeless tobacco: Never  Vaping Use   Vaping Use: Never used  Substance and Sexual Activity   Alcohol use: Not Currently    Comment: reports periodic alcohol use   Drug use: Yes    Frequency: 1.0  times per week    Types: Marijuana    Comment: 1/3 gm daily   Sexual activity: Not Currently  Other Topics Concern   Not on file  Social History Narrative   Not on file   Social Determinants of Health   Financial Resource Strain: Not on file  Food Insecurity: Not on file  Transportation Needs: Not on file  Physical Activity: Not on file  Stress: Not on file  Social Connections: Not on file  Intimate Partner Violence: Not on file    Review of Systems  Constitutional:  Negative for chills and fever.  HENT: Negative.    Eyes: Negative.   Respiratory:  Negative for shortness of breath.   Cardiovascular:  Negative for chest pain.  Gastrointestinal:  Negative for abdominal pain, nausea and vomiting.  Genitourinary:  Negative for dysuria, frequency and  urgency.  Musculoskeletal: Negative.   Skin: Negative.   Neurological: Negative.   Endo/Heme/Allergies: Negative.   Psychiatric/Behavioral:  Negative for depression. The patient is not nervous/anxious and does not have insomnia.         Objective    Ht '5\' 7"'$  (1.702 m)   Wt 186 lb (84.4 kg)   BMI 29.13 kg/m   Physical Exam Vitals and nursing note reviewed.  HENT:     Head: Normocephalic and atraumatic.     Right Ear: External ear normal.     Left Ear: External ear normal.     Nose: Nose normal.     Mouth/Throat:     Lips: Pink.     Mouth: Mucous membranes are moist.     Dentition: Dental tenderness, gingival swelling and dental caries present.     Pharynx: Oropharynx is clear.  Eyes:     Extraocular Movements: Extraocular movements intact.     Conjunctiva/sclera: Conjunctivae normal.     Pupils: Pupils are equal, round, and reactive to light.  Cardiovascular:     Rate and Rhythm: Normal rate and regular rhythm.     Pulses: Normal pulses.     Heart sounds: Normal heart sounds.  Pulmonary:     Effort: Pulmonary effort is normal.     Breath sounds: Normal breath sounds.  Musculoskeletal:        General: Normal range of motion.     Cervical back: Normal range of motion and neck supple.  Skin:    General: Skin is warm and dry.  Neurological:     General: No focal deficit present.     Mental Status: He is alert and oriented to person, place, and time.  Psychiatric:        Mood and Affect: Mood normal.        Thought Content: Thought content normal.        Judgment: Judgment normal.         Assessment & Plan:   Problem List Items Addressed This Visit   None Visit Diagnoses     Bipolar disorder in partial remission, most recent episode unspecified type ([place])    -  Primary   Methamphetamine abuse ([place])       Opioid abuse ([place])       Benzodiazepine abuse ([place])       Marijuana abuse       Tobacco abuse       Encounter for HCV screening test for high risk  patient         1. Pain, dental Patient has penicillin allergy.  Trial Flagyl, Levaquin.  Patient education given on  supportive care.  Patient encouraged to follow-up with dentistry as soon as able.  Red flags given for prompt reevaluation - metroNIDAZOLE (FLAGYL) 500 MG tablet; Take 1 tablet (500 mg total) by mouth 3 (three) times daily for 7 days.  Dispense: 21 tablet; Refill: 0 - levofloxacin (LEVAQUIN) 750 MG tablet; Take 1 tablet (750 mg total) by mouth daily for 7 days.  Dispense: 7 tablet; Refill: 0  2. Bipolar disorder in partial remission, most recent episode unspecified type ([place]) Continue current regimen, no refills needed today - Valproic acid level - Basic metabolic panel - Vitamin D, 25-hydroxy  3. Abnormal thyroid exam  - Thyroid Panel With TSH  4. Methamphetamine abuse [facility]) Patient currently in substance abuse treatment  5. Opioid abuse ([place])   6. Benzodiazepine abuse ([place])   7. Marijuana abuse   8. Tobacco abuse   9. Encounter for HCV screening test for high risk patient  - HCV Ab w Reflex to Quant PCR  10. Screen for STD (sexually transmitted disease)  - HIV antibody (with reflex) - RPR - Urine cytology ancillary only   I have reviewed the patient's medical history (PMH, PSH, Social History, Family History, Medications, and allergies) , and have been updated if relevant. I spent 30 minutes reviewing chart and  face to face time with patient.    No follow-ups on file.   Loraine Grip Mayers, PA-C

## 2022-08-15 NOTE — Patient Instructions (Signed)
To help with your dental pain, you are going to take metronidazole 3 times a day and Levaquin once a day both for 7 days.  I encourage you to follow-up with dentistry as soon as you are able.  We will call you with today's lab results.  Kennieth Rad, PA-C Physician Assistant [facility] [email or web address]   Dental Pain Dental pain is often a sign that something is wrong with your teeth or gums. It is also something that can occur following dental treatment. If you have dental pain, it is important to contact your dental care provider, especially if the cause of the pain has not been determined. Dental pain may be of varying intensity and can be caused by many things, including: Tooth decay (cavities or caries). Cavities are caused by bacteria that produce acids that irritate the nerve of your tooth, making it sensitive to air and hot or cold temperatures. This eventually causes discomfort or pain. Abscess or infection. Once the bacteria reach the inner part of the tooth (pulp), a bacterial infection (dental abscess) can occur. Pus typically collects at the end of the root of a tooth. Injury. A crack in the tooth. Gum recession exposing the root, and possibly the nerves, of a tooth. Gum (periodontal)disease. Abnormal grinding or clenching. Poor or improper home care. An unknown reason (idiopathic). Your pain may be mild or severe. It may occur when you are: Chewing. Exposed to hot or cold temperatures. Eating or drinking sugary foods or beverages, such as soda or candy. Your pain may be constant, or it may come and go without cause. Follow these instructions at home: The following actions may help to lessen any discomfort that you are feeling before or after getting dental care. Medicines Take over-the-counter and prescription medicines only as told by your dental care provider. If you were prescribed an antibiotic medicine, take  it as told by your dental care provider. Do not stop taking the antibiotic even if you start to feel better. Eating and drinking Avoid foods or drinks that cause you pain, such as: Very hot or very cold foods or drinks. Sweet or sugary foods or drinks. Managing pain and swelling  Ice can sometimes be used to reduce pain and swelling, especially if the pain is following dental treatment. If directed, put ice on the painful area of your face. To do this: Put ice in a plastic bag. Place a towel between your skin and the bag. Leave the ice on for 20 minutes, 2-3 times a day. Remove the ice if your skin turns bright red. This is very important. If you cannot feel pain, heat, or cold, you have a greater risk of damage to the area. Brushing your teeth To keep your mouth and gums healthy, brush your teeth twice a day using a fluoride toothpaste. Use a toothpaste made for sensitive teeth as directed by your dental care provider, especially if the root is exposed. Always brush your teeth with a soft-bristled toothbrush. This will help prevent irritation to your gums. General instructions Floss at least once a day. Do not apply heat to the outside of the face. Gargle with a mixture of salt and water 3-4 times a day or as needed. To make salt water, completely dissolve -1 tsp (3-6 g) of salt in 1 cup (237 mL) of warm water. Keep all follow-up visits. This is important. Contact a dental care provider if: You have any unexplained dental pain. Your pain is not controlled with medicines.  Your symptoms get worse. You have new symptoms. Get help right away if: You are unable to open your mouth. You are having trouble breathing or swallowing. You have a fever. You notice that your face, neck, or jaw is swollen. These symptoms may represent a serious problem that is an emergency. Do not wait to see if the symptoms will go away. Get medical help right away. Call your local emergency services (911 in the  U.S.). Do not drive yourself to the hospital. Summary Dental pain may be caused by many things, including tooth decay and infection. Your pain may be mild or severe. Take over-the-counter and prescription medicines only as told by your dental care provider. Watch your dental pain for any changes. Let your dental care provider know if your symptoms get worse. This information is not intended to replace advice given to you by your health care provider. Make sure you discuss any questions you have with your health care provider. Document Revised: 03/04/2020 Document Reviewed: 03/04/2020 Elsevier Patient Education  [place].

## 2022-08-16 LAB — URINE CYTOLOGY ANCILLARY ONLY
Chlamydia: NEGATIVE
Comment: NEGATIVE
Comment: NEGATIVE
Comment: NORMAL
Neisseria Gonorrhea: NEGATIVE
Trichomonas: NEGATIVE

## 2022-08-17 DIAGNOSIS — E559 Vitamin D deficiency, unspecified: Secondary | ICD-10-CM | POA: Insufficient documentation

## 2022-08-17 LAB — RPR: RPR Ser Ql: NONREACTIVE

## 2022-08-17 LAB — BASIC METABOLIC PANEL
BUN/Creatinine Ratio: 11 (ref 9–20)
BUN: 10 mg/dL (ref 6–20)
CO2: 22 mmol/L (ref 20–29)
Calcium: 9.6 mg/dL (ref 8.7–10.2)
Chloride: 102 mmol/L (ref 96–106)
Creatinine, Ser: 0.89 mg/dL (ref 0.76–1.27)
Glucose: 107 mg/dL — ABNORMAL HIGH (ref 70–99)
Potassium: 4.3 mmol/L (ref 3.5–5.2)
Sodium: 142 mmol/L (ref 134–144)
eGFR: 123 mL/min/{1.73_m2} (ref >59)

## 2022-08-17 LAB — VALPROIC ACID LEVEL: Valproic Acid Lvl: 55 ug/mL (ref 50–100)

## 2022-08-17 LAB — HIV ANTIBODY (ROUTINE TESTING W REFLEX): HIV Screen 4th Generation wRfx: NONREACTIVE

## 2022-08-17 LAB — VITAMIN D 25 HYDROXY (VIT D DEFICIENCY, FRACTURES): Vit D, 25-Hydroxy: 13.7 ng/mL — ABNORMAL LOW (ref 30.0–100.0)

## 2022-08-17 LAB — THYROID PANEL WITH TSH
Free Thyroxine Index: 1.5 (ref 1.2–4.9)
T3 Uptake Ratio: 28 % (ref 24–39)
T4, Total: 5.2 ug/dL (ref 4.5–12.0)
TSH: 2.1 u[IU]/mL (ref 0.450–4.500)

## 2022-08-17 LAB — HCV INTERPRETATION

## 2022-08-17 LAB — HCV AB W REFLEX TO QUANT PCR: HCV Ab: NONREACTIVE

## 2022-08-17 MED ORDER — VITAMIN D (ERGOCALCIFEROL) 1.25 MG (50000 UNIT) PO CAPS: 50000.0000 [IU] | ORAL_CAPSULE | ORAL | 2 refills | Status: DC

## 2022-08-17 NOTE — Addendum Note (Signed)
Addended by: Kennieth Rad on: 08/17/2022 01:56 PM   Modules accepted: Orders

## 2022-08-18 ENCOUNTER — Emergency Department (HOSPITAL_BASED_OUTPATIENT_CLINIC_OR_DEPARTMENT_OTHER)
Admission: EM | Admit: 2022-08-18 | Discharge: 2022-08-18 | Disposition: A | Discharge status: Home / Self Care | Payer: Medicaid Other | Attending: Emergency Medicine | Admitting: Emergency Medicine

## 2022-08-18 ENCOUNTER — Emergency Department (HOSPITAL_BASED_OUTPATIENT_CLINIC_OR_DEPARTMENT_OTHER)
Admission: EM | Admit: 2022-08-18 | Discharge: 2022-08-20 | Disposition: A | Discharge status: Home / Self Care | Payer: Medicaid Other | Source: Home / Self Care | Attending: Emergency Medicine | Admitting: Emergency Medicine

## 2022-08-18 ENCOUNTER — Other Ambulatory Visit: Payer: Self-pay

## 2022-08-18 DIAGNOSIS — Z1152 Encounter for screening for COVID-19: Secondary | ICD-10-CM | POA: Insufficient documentation

## 2022-08-18 DIAGNOSIS — F319 Bipolar disorder, unspecified: Secondary | ICD-10-CM | POA: Diagnosis not present

## 2022-08-18 DIAGNOSIS — X58XXXA Exposure to other specified factors, initial encounter: Secondary | ICD-10-CM | POA: Insufficient documentation

## 2022-08-18 DIAGNOSIS — T1491XA Suicide attempt, initial encounter: Secondary | ICD-10-CM | POA: Diagnosis not present

## 2022-08-18 DIAGNOSIS — F602 Antisocial personality disorder: Secondary | ICD-10-CM | POA: Diagnosis not present

## 2022-08-18 DIAGNOSIS — F909 Attention-deficit hyperactivity disorder, unspecified type: Secondary | ICD-10-CM | POA: Diagnosis not present

## 2022-08-18 DIAGNOSIS — F32A Depression, unspecified: Secondary | ICD-10-CM | POA: Diagnosis not present

## 2022-08-18 DIAGNOSIS — F4310 Post-traumatic stress disorder, unspecified: Secondary | ICD-10-CM | POA: Diagnosis not present

## 2022-08-18 DIAGNOSIS — F1590 Other stimulant use, unspecified, uncomplicated: Secondary | ICD-10-CM | POA: Insufficient documentation

## 2022-08-18 DIAGNOSIS — R45851 Suicidal ideations: Secondary | ICD-10-CM | POA: Diagnosis not present

## 2022-08-18 LAB — COMPREHENSIVE METABOLIC PANEL
ALT: 12 U/L (ref 0–44)
ALT: 15 U/L (ref 0–44)
ALT: 16 U/L (ref 0–44)
AST: 28 U/L (ref 15–41)
AST: 19 U/L (ref 15–41)
AST: 16 U/L (ref 15–41)
Albumin: 3.8 g/dL (ref 3.5–5.0)
Albumin: 4.2 g/dL (ref 3.5–5.0)
Albumin: 3.8 g/dL (ref 3.5–5.0)
Alkaline Phosphatase: 43 U/L (ref 38–126)
Alkaline Phosphatase: 44 U/L (ref 38–126)
Alkaline Phosphatase: 42 U/L (ref 38–126)
Anion gap: 8 (ref 5–15)
Anion gap: 9 (ref 5–15)
Anion gap: 6 (ref 5–15)
BUN: 13 mg/dL (ref 6–20)
BUN: 13 mg/dL (ref 6–20)
BUN: 14 mg/dL (ref 6–20)
CO2: 22 mmol/L (ref 22–32)
CO2: 24 mmol/L (ref 22–32)
CO2: 26 mmol/L (ref 22–32)
Calcium: 8.3 mg/dL — ABNORMAL LOW (ref 8.9–10.3)
Calcium: 8.9 mg/dL (ref 8.9–10.3)
Calcium: 8.7 mg/dL — ABNORMAL LOW (ref 8.9–10.3)
Chloride: 107 mmol/L (ref 98–111)
Chloride: 106 mmol/L (ref 98–111)
Chloride: 106 mmol/L (ref 98–111)
Creatinine, Ser: 0.94 mg/dL (ref 0.61–1.24)
Creatinine, Ser: 0.91 mg/dL (ref 0.61–1.24)
Creatinine, Ser: 0.9 mg/dL (ref 0.61–1.24)
GFR, Estimated: >60 mL/min (ref >60)
GFR, Estimated: >60 mL/min (ref >60)
GFR, Estimated: >60 mL/min (ref >60)
Glucose, Bld: 122 mg/dL — ABNORMAL HIGH (ref 70–99)
Glucose, Bld: 98 mg/dL (ref 70–99)
Glucose, Bld: 85 mg/dL (ref 70–99)
Potassium: 3.7 mmol/L (ref 3.5–5.1)
Potassium: 3.9 mmol/L (ref 3.5–5.1)
Potassium: 4.2 mmol/L (ref 3.5–5.1)
Sodium: 137 mmol/L (ref 135–145)
Sodium: 139 mmol/L (ref 135–145)
Sodium: 138 mmol/L (ref 135–145)
Total Bilirubin: 0.4 mg/dL (ref 0.3–1.2)
Total Bilirubin: 0.6 mg/dL (ref 0.3–1.2)
Total Bilirubin: 0.7 mg/dL (ref 0.3–1.2)
Total Protein: 6.6 g/dL (ref 6.5–8.1)
Total Protein: 7 g/dL (ref 6.5–8.1)
Total Protein: 6.6 g/dL (ref 6.5–8.1)

## 2022-08-18 LAB — ACETAMINOPHEN LEVEL
Acetaminophen (Tylenol), Serum: <10 ug/mL — ABNORMAL LOW (ref 10–30)
Acetaminophen (Tylenol), Serum: <10 ug/mL — ABNORMAL LOW (ref 10–30)
Acetaminophen (Tylenol), Serum: <10 ug/mL — ABNORMAL LOW (ref 10–30)

## 2022-08-18 LAB — CBC
HCT: 42.6 % (ref 39.0–52.0)
HCT: 43.7 % (ref 39.0–52.0)
Hemoglobin: 13.9 g/dL (ref 13.0–17.0)
Hemoglobin: 14.4 g/dL (ref 13.0–17.0)
MCH: 29.5 pg (ref 26.0–34.0)
MCH: 29.6 pg (ref 26.0–34.0)
MCHC: 32.6 g/dL (ref 30.0–36.0)
MCHC: 33 g/dL (ref 30.0–36.0)
MCV: 90.4 fL (ref 80.0–100.0)
MCV: 89.7 fL (ref 80.0–100.0)
Platelets: 183 10*3/uL (ref 150–400)
Platelets: 190 10*3/uL (ref 150–400)
RBC: 4.71 MIL/uL (ref 4.22–5.81)
RBC: 4.87 MIL/uL (ref 4.22–5.81)
RDW: 13.1 % (ref 11.5–15.5)
RDW: 13.2 % (ref 11.5–15.5)
WBC: 5.8 10*3/uL (ref 4.0–10.5)
WBC: 5.7 10*3/uL (ref 4.0–10.5)
nRBC: 0 % (ref 0.0–0.2)
nRBC: 0 % (ref 0.0–0.2)

## 2022-08-18 LAB — SALICYLATE LEVEL
Salicylate Lvl: <7 mg/dL — ABNORMAL LOW (ref 7.0–30.0)
Salicylate Lvl: <7 mg/dL — ABNORMAL LOW (ref 7.0–30.0)

## 2022-08-18 LAB — RAPID URINE DRUG SCREEN, HOSP PERFORMED
Amphetamines: NOT DETECTED
Barbiturates: NOT DETECTED
Benzodiazepines: NOT DETECTED
Cocaine: NOT DETECTED
Opiates: NOT DETECTED
Tetrahydrocannabinol: NOT DETECTED

## 2022-08-18 LAB — ETHANOL
Alcohol, Ethyl (B): <10 mg/dL (ref <10)
Alcohol, Ethyl (B): <10 mg/dL (ref <10)

## 2022-08-18 MED ORDER — DIVALPROEX SODIUM 250 MG PO DR TAB
1000.0000 mg | DELAYED_RELEASE_TABLET | Freq: Every day | ORAL | Status: DC
  Administered 2022-08-19: 1000 mg via ORAL
  Filled 2022-08-18 (×2): qty 4

## 2022-08-18 MED ORDER — TRAZODONE HCL 50 MG PO TABS: 50.0000 mg | ORAL_TABLET | Freq: Every evening | ORAL | Status: DC | PRN

## 2022-08-18 MED ORDER — PRAZOSIN HCL 2 MG PO CAPS
2.0000 mg | ORAL_CAPSULE | Freq: Every day | ORAL | Status: DC
  Filled 2022-08-18: qty 1

## 2022-08-18 NOTE — ED Notes (Signed)
TTS conversation complete

## 2022-08-18 NOTE — ED Notes (Signed)
Room locked down, cabinets locked, chords and potentially harmful materials secured. Pt provided with hospital scrubs and instructed to change out of clothes and place in bag. Arrives with large plastic bag of belongings. Instructed that we could put his valuables like phone/wallet with security however patient refuses because he said it took a month to get it back last time he was here, states he would rather have it with his belongings.

## 2022-08-18 NOTE — ED Triage Notes (Signed)
Pt arrives with c/o being suicidal and was just discharged. Per pt, he took '3800mg'$  of seroquel about an hour ago. Per pt, he has no where to go. Pt endorses auditory and visual hallucinations.

## 2022-08-18 NOTE — ED Provider Notes (Signed)
[place] [facility] Provider Note   CSN: [id number] Arrival date & time: 08/18/22  1107     History  Chief Complaint  Patient presents with   Suicidal    Jon Clayton is a 27 y.o. male.  HPI   27 year old male presents emergency department with complaints of suicidal ideation/homicidal ideation.  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  States that he was thinking about harming himself by stepping out to an oncoming vehicle.  Patient with history of suicide attempts via cutting.  States that he has had passive thoughts of harming people with no direct plan.  Also states he was experiencing some tactile sensations of "people poking me" that began today.  Denies any substance use since discharge from facility but states that he did take 100 Seroquel earlier this morning as well as 800 mg of Seroquel prior to coming back to the room around noon.  Denies fever, chills, night sweats, chest pain, shortness of breath, abdominal pain, nausea, vomiting, urinary symptoms, change in bowel habits.  Past medical history significant for depression, psychosis, PTSD  Home Medications Prior to Admission medications   Medication Sig Start Date End Date Taking? Authorizing Provider  albuterol (VENTOLIN HFA) 108 (90 Base) MCG/ACT inhaler Inhale 2 puffs into the lungs every 6 (six) hours as needed for wheezing or shortness of breath. 07/07/22   Merrily Brittle, DO  divalproex (DEPAKOTE) 500 MG DR tablet Take 1,000 mg by mouth at bedtime. 07/19/22 08/18/22  [provider]  levofloxacin (LEVAQUIN) 750 MG tablet Take 1 tablet (750 mg total) by mouth daily for 7 days. 08/15/22 08/22/22  Mayers, Cari S, PA-C  metroNIDAZOLE (FLAGYL) 500 MG tablet Take 1 tablet (500 mg total) by mouth 3 (three) times daily for 7 days. 08/15/22 08/22/22  Mayers, Cari S, PA-C  prazosin (MINIPRESS) 2 MG capsule Take 2 mg by  mouth at bedtime. 08/01/22   [provider]  QUEtiapine (SEROQUEL) 100 MG tablet Take 100 mg by mouth at bedtime. 08/01/22   [provider]  traZODone (DESYREL) 50 MG tablet Take 50 mg by mouth at bedtime as needed for sleep.    [provider]  Vitamin D, Ergocalciferol, (DRISDOL) 1.25 MG (50000 UNIT) CAPS capsule Take 1 capsule (50,000 Units total) by mouth every 7 (seven) days. 08/17/22   Mayers, Cari S, PA-C      Allergies    Amoxicillin, Penicillins, Other, Pertussis vaccines, and Blue mussel [mytilus]    Review of Systems   Review of Systems  All other systems reviewed and are negative.   Physical Exam Updated Vital Signs BP 106/65 (BP Location: Left Arm)   Pulse 94   Temp 99.2 F (37.3 C) (Oral)   Resp 18   Ht '5\' 7"'$  (1.702 m)   Wt 84.4 kg   SpO2 98%   BMI 29.13 kg/m  Physical Exam Vitals and nursing note reviewed.  Constitutional:      General: He is not in acute distress.    Appearance: He is well-developed.  HENT:     Head: Normocephalic and atraumatic.  Eyes:     Conjunctiva/sclera: Conjunctivae normal.  Cardiovascular:     Rate and Rhythm: Normal rate and regular rhythm.     Heart sounds: No murmur heard. Pulmonary:     Effort: Pulmonary effort is normal. No respiratory distress.     Breath sounds: Normal breath sounds.  Abdominal:  Palpations: Abdomen is soft.     Tenderness: There is no abdominal tenderness.  Musculoskeletal:        General: No swelling.     Cervical back: Neck supple.  Skin:    General: Skin is warm and dry.     Capillary Refill: Capillary refill takes less than 2 seconds.  Neurological:     Mental Status: He is alert.  Psychiatric:        Mood and Affect: Mood normal.     ED Results / Procedures / Treatments   Labs (all labs ordered are listed, but only abnormal results are displayed) Labs Reviewed  COMPREHENSIVE METABOLIC PANEL - Abnormal; Notable for the following components:      Result Value    Glucose, Bld 122 (*)    Calcium 8.3 (*)    All other components within normal limits  SALICYLATE LEVEL - Abnormal; Notable for the following components:   Salicylate Lvl Q000111Q (*)    All other components within normal limits  ACETAMINOPHEN LEVEL - Abnormal; Notable for the following components:   Acetaminophen (Tylenol), Serum <10 (*)    All other components within normal limits  ETHANOL  CBC  RAPID URINE DRUG SCREEN, HOSP PERFORMED    EKG EKG Interpretation  Date/Time:  Thursday August 18 2022 12:16:26 EST Ventricular Rate:  84 PR Interval:  133 QRS Duration: 85 QT Interval:  345 QTC Calculation: 408 R Axis:   71 Text Interpretation: Sinus rhythm ST elev, probable normal early repol pattern No acute changes No significant change since last tracing Confirmed by Varney Biles [phone]) on 08/18/2022 12:21:10 PM  Radiology No results found.  Procedures Procedures    Medications Ordered in ED Medications - No data to display  ED Course/ Medical Decision Making/ A&P Clinical Course as of 08/18/22 1626  Thu Aug 18, 2022  1624 Patient observed now in the department for 4 and half hours after reported ingestion of Seroquel.  No acute changes in mentation or behavior.  Plan to discharge. [CR]    Clinical Course User Index [CR] Wilnette Kales, PA                             Medical Decision Making Amount and/or Complexity of Data Reviewed Labs: ordered.   This patient presents to the ED for concern of suicidal ideation, this involves an extensive number of treatment options, and is a complaint that carries with it a high risk of complications and morbidity.  The differential diagnosis includes suicidal ideation, homicidal ideation, ingestion, auditory/visual hallucination, psychosis, drug intoxication/withdrawal   Co morbidities that complicate the patient evaluation  See HPI   Additional history obtained:  Additional history obtained from EMR External records  from outside source obtained and reviewed including hospital records   Lab Tests:  I Ordered, and personally interpreted labs.  The pertinent results include: No leukocytosis noted.  No evidence of anemia.  Platelets within range.  No electrolyte adenitis appreciated.  No transaminitis.  No renal dysfunction.  UDS negative.  Acetaminophen and salicylate and ethanol levels within normal limits.   Imaging Studies ordered:  N/a   Cardiac Monitoring: / EKG:  The patient was maintained on a cardiac monitor.  I personally viewed and interpreted the cardiac monitored which showed an underlying rhythm of: Sinus rhythm with no acute ischemic changes from prior EKGs performed.   Consultations Obtained:  I requested consultation TTS as well as attending physician  Nanavati who were in agreement with treatment plan going forward   Problem List / ED Course / Critical interventions / Medication management  Suicidal ideation Reevaluation of the patient  showed that the patient stayed the same I have reviewed the patients home medicines and have made adjustments as needed   Social Determinants of Health:  Polysubstance abuse   Test / Admission - Considered:  Suicidal ideation Vitals signs within normal range and stable throughout visit. Imaging studies significant for: See above Given consumption of Seroquel 800 mg, patient observed for 4 to 5 hours on the emergency department with n progression/appearance of symptoms concerning for cervical overdose.  Patient afebrile, no acute distress with reassuring EKG.  Patient cleared medically for TTS consultation.  Per TTS, patient with multiple visits for similar symptoms exhibiting possibly malingering type symptoms visiting multiple inpatient facilities over the past few weeks and months.  Behavioral health believes that patient would most benefit from outpatient management.  After observation timeframe, patient deemed cleared medically as well.   Patient given outpatient resources as well as made sure outpatient medicines were up-to-date/refill before discharge.  Patient noted improvement of symptoms with time elapsed on the emergency department.  Treatment plan discussed at length with patient and he acknowledged understanding was agreeable to said plan. Worrisome signs and symptoms were discussed with the patient, and the patient acknowledged understanding to return to the ED if noticed. Patient was stable upon discharge.          Final Clinical Impression(s) / ED Diagnoses Final diagnoses:  Suicidal ideation    Rx / DC Orders ED Discharge Orders     None         Wilnette Kales, [place] 08/18/22 [place], Ankit, MD 08/19/22 [phone]

## 2022-08-18 NOTE — ED Provider Notes (Signed)
[place] [facility] [place] [facility] Provider Note   CSN: [id number] Arrival date & time: 08/18/22  1836     History  Chief Complaint  Patient presents with   Suicide Attempt    Shawon Zappala is a 27 y.o. male history of homelessness, schizophrenia here presenting with suicidal attempt.  Patient was recently discharged from behavioral health facility.  Patient was seen here earlier for passive suicidal ideation with no particular plan.  Patient was also seen by behavioral health earlier today and was clear.  Patient states that he had nowhere to go so was depressed and took 3000 mg of his Seroquel around 6 PM today.  He then checked back in to be seen in the ER.  Patient had multiple recent psychiatric admissions.  The history is provided by the patient.       Home Medications Prior to Admission medications   Medication Sig Start Date End Date Taking? Authorizing Provider  albuterol (VENTOLIN HFA) 108 (90 Base) MCG/ACT inhaler Inhale 2 puffs into the lungs every 6 (six) hours as needed for wheezing or shortness of breath. 07/07/22   Merrily Brittle, DO  divalproex (DEPAKOTE) 500 MG DR tablet Take 1,000 mg by mouth at bedtime. 07/19/22 08/18/22  [provider]  levofloxacin (LEVAQUIN) 750 MG tablet Take 1 tablet (750 mg total) by mouth daily for 7 days. 08/15/22 08/22/22  Mayers, Cari S, PA-C  metroNIDAZOLE (FLAGYL) 500 MG tablet Take 1 tablet (500 mg total) by mouth 3 (three) times daily for 7 days. 08/15/22 08/22/22  Mayers, Cari S, PA-C  prazosin (MINIPRESS) 2 MG capsule Take 2 mg by mouth at bedtime. 08/01/22   [provider]  QUEtiapine (SEROQUEL) 100 MG tablet Take 100 mg by mouth at bedtime. 08/01/22   [provider]  traZODone (DESYREL) 50 MG tablet Take 50 mg by mouth at bedtime as needed for sleep.    [provider]  Vitamin D, Ergocalciferol, (DRISDOL) 1.25 MG (50000 UNIT) CAPS capsule Take 1 capsule (50,000 Units total) by mouth  every 7 (seven) days. 08/17/22   Mayers, Cari S, PA-C      Allergies    Amoxicillin, Penicillins, Other, Pertussis vaccines, and Blue mussel [mytilus]    Review of Systems   Review of Systems  Psychiatric/Behavioral:  Positive for behavioral problems and suicidal ideas.   All other systems reviewed and are negative.   Physical Exam Updated Vital Signs BP 129/77 (BP Location: Right Arm)   Pulse 98   Temp 98.4 F (36.9 C) (Oral)   Resp 18   Ht '5\' 7"'$  (1.702 m)   Wt 84.4 kg   SpO2 97%   BMI 29.13 kg/m  Physical Exam Vitals and nursing note reviewed.  Constitutional:      Comments: Depressed and suicidal and slightly sleepy  HENT:     Head: Normocephalic.     Nose: Nose normal.     Mouth/Throat:     Mouth: Mucous membranes are moist.  Eyes:     Extraocular Movements: Extraocular movements intact.     Pupils: Pupils are equal, round, and reactive to light.  Cardiovascular:     Rate and Rhythm: Normal rate and regular rhythm.     Pulses: Normal pulses.     Heart sounds: Normal heart sounds.  Pulmonary:     Effort: Pulmonary effort is normal.     Breath sounds: Normal breath sounds.  Abdominal:     General: Abdomen is flat.  Palpations: Abdomen is soft.  Musculoskeletal:        General: Normal range of motion.     Cervical back: Normal range of motion and neck supple.  Skin:    General: Skin is warm.     Capillary Refill: Capillary refill takes less than 2 seconds.  Neurological:     General: No focal deficit present.     Mental Status: He is oriented to person, place, and time.  Psychiatric:     Comments: Depressed and suicidal     ED Results / Procedures / Treatments   Labs (all labs ordered are listed, but only abnormal results are displayed) Labs Reviewed  SALICYLATE LEVEL - Abnormal; Notable for the following components:      Result Value   Salicylate Lvl Q000111Q (*)    All other components within normal limits  ACETAMINOPHEN LEVEL - Abnormal; Notable  for the following components:   Acetaminophen (Tylenol), Serum <10 (*)    All other components within normal limits  COMPREHENSIVE METABOLIC PANEL  ETHANOL  CBC  RAPID URINE DRUG SCREEN, HOSP PERFORMED  CBG MONITORING, ED    EKG EKG Interpretation  Date/Time:  Thursday August 18 2022 19:04:35 EST Ventricular Rate:  108 PR Interval:  126 QRS Duration: 80 QT Interval:  322 QTC Calculation: 432 R Axis:   71 Text Interpretation: Sinus tachycardia No significant change since last tracing Confirmed by Wandra Arthurs [phone]) on 08/18/2022 7:26:01 PM  Radiology No results found.  Procedures Procedures    Medications Ordered in ED Medications - No data to display  ED Course/ Medical Decision Making/ A&P                             Medical Decision Making Kamdin Kenon is a 27 y.o. male here presenting with suicidal attempt.  He took 3000 mg of Seroquel.  Poison control was consulted and recommend 4-hour Tylenol level and observation for 4 to 6 hours.  Will consult TTS as well.  11:23 PM Reviewed patient's labs and his 4-hour Tylenol level is still negative.  LFTs are normal.  Patient is medically clear for psych eval.  Behavioral health saw patient and will reassess in the morning.   Problems Addressed: Suicidal behavior with attempted self-injury [facility]): acute illness or injury  Amount and/or Complexity of Data Reviewed Labs: ordered. ECG/medicine tests: ordered and independent interpretation performed. Decision-making details documented in ED Course.    Final Clinical Impression(s) / ED Diagnoses Final diagnoses:  None    Rx / DC Orders ED Discharge Orders     None         Drenda Freeze, MD 08/18/22 2324

## 2022-08-18 NOTE — BH Assessment (Signed)
Christina D. Maffucci, RN, reports Poison Control recommended the pt for observation. Clinician asked RN to let her know when the pt is medically cleared for his assessment.   RN added clinician to message thread.    Vertell Novak, [place], [facility], [facility] Triage Specialist [phone]

## 2022-08-18 NOTE — ED Notes (Signed)
Pt upset about getting discharged . Refuses VS taken

## 2022-08-18 NOTE — BH Assessment (Signed)
Clinician messaged Christina D. Maffucci, RN to see if the pt was medically cleared. Per Loree Fee, RN note, pt took "'3800mg'$  of seroquel about an hour ago."   Clinician waiting response.    Vertell Novak, [place], [facility], [facility] Triage Specialist [phone]
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
## 2022-08-18 NOTE — ED Notes (Signed)
Pt ambulatory from the bathroom, pt states that he just got out of behavioral health today, states that he started having some tactile hallucinations and thoughts of wanting to kill himself, states that his plan was to step in front of a truck.  States that he is also having some feelings of anger. Denies meth or drug use today.  Pt states that he was concerned that his hallucinations would progress to visual and auditory.  Security at bedside to wand pt and secure belongings, pt changed into hospital paper scrubs.

## 2022-08-18 NOTE — ED Notes (Signed)
Security at bedside wanding patient

## 2022-08-18 NOTE — Discharge Instructions (Addendum)
Note the workup today was overall reassuring.  Per behavioral health recommendation, recommend follow-up with resources provided outpatient.  Continue take at home medications as prescribed.  Avoid taking more medication than prescribed.  Attached is information for behavioral health urgent care if you notice acute worsening of mental status.  Please do not hesitate to return to emergency department for worrisome signs and symptoms we discussed become apparent.

## 2022-08-18 NOTE — ED Triage Notes (Signed)
Arrived from [facility] by PD; reports suicidal ideation and tactile, auditory, visual hallucinations, "even taste and smell sometimes". Stated "I want to catch it early before it gets worst"., Reports has mx plans also. States he also his prescribed seroquel x 3 tabs to help him feel better,

## 2022-08-18 NOTE — ED Notes (Signed)
Belongings placed in 4 belongings bag in cabinet above where NT sits. Patient has several medications in one bag, would not all fit in pyxis so Apolonio Schneiders, ED pharmacist states she is fine leaving them in the belongings bag. Pt informed his belongings would need to be separated into multiple bags to fit in the cabinet.

## 2022-08-18 NOTE — ED Notes (Signed)
Patient speaking to TTS.

## 2022-08-19 LAB — RESP PANEL BY RT-PCR (RSV, FLU A&B, COVID)  RVPGX2
Influenza A by PCR: NEGATIVE
Influenza B by PCR: NEGATIVE
Resp Syncytial Virus by PCR: NEGATIVE
SARS Coronavirus 2 by RT PCR: NEGATIVE

## 2022-08-19 LAB — CBG MONITORING, ED
Glucose-Capillary: 76 mg/dL (ref 70–99)
Glucose-Capillary: 82 mg/dL (ref 70–99)

## 2022-08-19 LAB — URINALYSIS, ROUTINE W REFLEX MICROSCOPIC
Bilirubin Urine: NEGATIVE
Glucose, UA: NEGATIVE mg/dL
Hgb urine dipstick: NEGATIVE
Ketones, ur: NEGATIVE mg/dL
Leukocytes,Ua: NEGATIVE
Nitrite: NEGATIVE
Protein, ur: NEGATIVE mg/dL
Specific Gravity, Urine: 1.025 (ref 1.005–1.030)
pH: 6 (ref 5.0–8.0)

## 2022-08-19 MED ORDER — ACETAMINOPHEN 500 MG PO TABS
1000.0000 mg | ORAL_TABLET | Freq: Once | ORAL | Status: AC
  Administered 2022-08-19: 1000 mg via ORAL
  Filled 2022-08-19: qty 2

## 2022-08-19 NOTE — ED Notes (Signed)
Have provided shower and body hygiene products for client, freq cks by this RN while in wash room. Food also provided.

## 2022-08-19 NOTE — ED Notes (Signed)
Security given env with pt cell phone, case w/glasses, watch, [place] [employer] card no cash or wallet. Pt meds given to charge RN Delrae Alfred to place in med room. List placed in env and verified by RN Christina. Pt belongs consist of 1 pr boots, 1 pr tennis shoes, 1 pr flip flops, 2 shirts, 2 warm up pants, 4 pr of sox-3 hospital and 1 reg sox, 1 warm up jacket, 2 caps, and multi toiletry items. Note book and misc papers in belongs bag in the cabinet over techs head.
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
## 2022-08-19 NOTE — ED Notes (Signed)
Urine spec still not obtained, client has been incontinent of urine and due to AMS has been unable to provide urine spec.

## 2022-08-19 NOTE — Progress Notes (Addendum)
This CSW communicated with [place] due to assigned nurse informing that her Charge RN spoke with [facility] earlier. CSW informed that there is no documentation listed in pt's chart about a potential bed offer from [place].CSW discussed the importance of documenting updates in pt's chart so the whole interdisciplinary team is aware of updates. Ruben Reason, RN apologized for the confusion and informed that she did not take the call with Sharlene Motts but requested clarification to eliminate confusion with placement. This CSW agreed and informed that CSW would follow up with Sharlene Motts.  CSW called [facility] [phone] to inquire about potential bed offer. Simona Huh with [facility]ene Motts Intake informed that there is a PENDING bed offer awaiting Labs and negative COVID. CSW inquired who Intake originally spoke with. Intake apologized and informed that the information was communication during 1st shift around 5:17pm as noted in their system. CSW informed that our 2nd shift CSW starts at 4:00pm which is this CSW and this CSW did not receive a phone call from [facility] or have a missed phone call. Intake informed that the communication was made with an employee with [facility] ED contacted instead of the CSW Disposition team. This CSW provided education to [place] that the CSW Disposition team coordinates bed placement until 12:30pm and then 1st shift returns at [place] advised that Intake can reach out directly to the ED's after 12:30pm. In efforts to keep pts local pt will keep bed offer with [facility] since [facility] is further from pt's local community.   CSW updated Ruben Reason, RN that pt will transfer to [facility] tomorrow as documented in pt's chart. CSW confirmed bed offer with [facility]. No further follow up needed at this time.   Benjaman Kindler, MSW, LCSWA 08/19/2022 10:50 PM

## 2022-08-19 NOTE — ED Notes (Signed)
Client remains very sleepy, awakes to verbal stimuli after his name is called multiple times, cooperative and calm, but easily falls back to sleep, having a difficult time remaining awaike

## 2022-08-19 NOTE — Progress Notes (Signed)
Pt was accepted to  [facility] 08/20/22; Bed Assignment [place] [facility] [address]   Pt meets inpatient criteria per Molli Barrows, FNP  Attending Physician will be Rosann Auerbach, MD  Report can be called to:[phone] or [phone]  Pt can arrive after 8:00am  Nursing notified: Ruben Reason, RN  [address], [place] 08/19/2022 @ 8:20 PM

## 2022-08-19 NOTE — ED Notes (Signed)
Checked CBG 82, RN Christina informed

## 2022-08-19 NOTE — ED Notes (Signed)
RN updated intake Therapist, [facility] at [facility]. Per intake RN, [facility] will [facility].

## 2022-08-19 NOTE — ED Notes (Signed)
Poison controlled called and updated. No other recommendations given

## 2022-08-19 NOTE — ED Notes (Signed)
TTS currently in progress

## 2022-08-19 NOTE — Progress Notes (Signed)
LCSW Progress Note  [id number]   Jon Clayton  08/19/2022  2:55 PM  Description:   Inpatient Psychiatric Referral  Patient was recommended inpatient per Molli Barrows, NP. There are no beds available at [facility]. Patient was referred to the following facilities:   Destination  Service Provider Address Phone Fax  [place] [place] [address] [phone]  [facility]  [address] [place] [address] [phone]  [place], [place] [id number] [phone]  [facility]  [place], [place] [address] [phone] [place]  [address] [place] [address] [phone]  [facility]  [place], [place] [address] [phone]  [facility]  [address] [place] [address] [phone] [place]  [place], [place] [address] [phone] [facility]  [id number] [place] [address] [phone] [facility]  [address] [place], [name] [address] [phone] [place] [place] [address] [place]  [facility]  [address] [place] [address] [phone]  [facility]  [address], [place] [address] [phone]  [place] [address] [phone]  [facility]  [place] [address] [phone]  [facility]  [address], [place] [address] [phone] [facility]  [address], [place] [address] [phone] [facility]  [address] [place] [address] [phone]  [facility]  [address] [place] [address] [phone] [facility]  [address] [place] [address] [phone]  [facility]  [address] [place] [address] [place]  [facility]  [address], [place] [address] [phone] [place]  [address], [place] [address] [phone] [place]., [place] [address] [phone]  [facility]  [address] [place] [address] [phone]    Situation ongoing, CSW to continue following and update chart as more information becomes available.      Denna Haggard, Latanya Presser  08/19/2022 2:55 PM

## 2022-08-19 NOTE — ED Notes (Signed)
Client is alert and oriented, very calm and cooperative, lunch provided.

## 2022-08-20 NOTE — ED Notes (Signed)
[place] meal tray, ginger-ale, graham cracker cookies

## 2022-08-20 NOTE — ED Notes (Signed)
Unable to find medications. Pt said to call PO at [phone] or Mrs. Henderson at [phone] extension [phone] once medications are found.

## 2022-08-20 NOTE — ED Notes (Addendum)
Patient given basin of water, soap, tooth brush ,clean purple scrubs, socks, mesh underwear to wash up

## 2022-08-20 NOTE — ED Notes (Addendum)
4 patient bags with label with patients name   1 secured envelope containing:  1 watch 2.   1 eyeglass case with reading glasses 3.   1 [place] transit pass 4.   1 Cell phone Vortex android  5.   1 Charging brick 6.   1 Type C charging cord   Gathered to hand to safe transport

## 2022-08-20 NOTE — ED Notes (Signed)
ED Provider at bedside. 

## 2022-08-20 NOTE — ED Notes (Signed)
Safe transport arrived

## 2022-08-20 NOTE — ED Provider Notes (Signed)
Emergency Medicine Observation Re-evaluation Note  Jon Clayton is a 27 y.o. male, seen on rounds today.  Pt initially presented to the ED for complaints of Suicide Attempt Currently, the patient is awaiting admission, plan to go to [place] [facility].  Physical Exam  BP 109/64 (BP Location: Right Arm)   Pulse 73   Temp 97.8 F (36.6 C)   Resp 11   Ht '5\' 7"'$  (1.702 m)   Wt 84.4 kg   SpO2 98%   BMI 29.13 kg/m  Physical Exam General: NAD, resting comfortably  Cardiac: RR Lungs: even, unlabored   ED Course / MDM  EKG:EKG Interpretation  Date/Time:  Thursday August 18 2022 22:25:13 EST Ventricular Rate:  75 PR Interval:  149 QRS Duration: 86 QT Interval:  378 QTC Calculation: 423 R Axis:   60 Text Interpretation: Sinus rhythm No significant change since last tracing Confirmed by Wandra Arthurs [phone]) on 08/18/2022 11:10:24 PM  I have reviewed the labs performed to date as well as medications administered while in observation.  Recent changes in the last 24 hours include, observed in ED, medically cleared, plan for inpatient psychiatric admission.  Plan  Current plan is for [facility] to [facility] for inpatient treatment.     Gareth Morgan, MD 08/20/22 [phone]
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
## 2022-08-20 NOTE — ED Notes (Signed)
Peanut butter, graham crackers, ginger-ale, milk

## 2022-08-20 NOTE — ED Notes (Signed)
Called to [facility] confirmed 11am pick up.  Driver will call back at with actual ETA.  Advised to pick up at [place]

## 2022-08-20 NOTE — ED Notes (Signed)
Called [facility] to transport patient to [facility] in [place].   Pick up should be around 11am.  We are to call back at 10:45am to confirm.  [phone]

## 2022-08-20 NOTE — ED Notes (Signed)
Pt medications are located in Code cabinet in Med room. Need to access key in pyxis. Made attempt to contact pts PO, Ms Koleen Nimrod today but reached vm, message left to return call.

## 2022-08-20 NOTE — ED Notes (Signed)
Unable to find patient medications. Looked in pyxis, patient belongings bags, all cabinets at nurses station including patient belonging cabinet, security, and locked cabinets in patient room.

## 2022-08-20 NOTE — ED Notes (Signed)
Pt belongings including valuables gathered to give to transport

## 2022-08-23 ENCOUNTER — Telehealth (HOSPITAL_BASED_OUTPATIENT_CLINIC_OR_DEPARTMENT_OTHER): Payer: Self-pay

## 2022-08-23 NOTE — Telephone Encounter (Signed)
Called Mrs. Koleen Nimrod, patients parole officer, to notify that meds are in the cart cabinet in med room and that keys ar in the pyxis. States she will pick them up at some point probably this week as pt is in [place] and may be released on Saturday.

## 2022-08-28 ENCOUNTER — Telehealth (HOSPITAL_BASED_OUTPATIENT_CLINIC_OR_DEPARTMENT_OTHER): Payer: Self-pay | Admitting: Emergency Medicine

## 2022-09-16 ENCOUNTER — Other Ambulatory Visit: Payer: Self-pay

## 2022-09-16 ENCOUNTER — Emergency Department (HOSPITAL_COMMUNITY)
Admission: EM | Admit: 2022-09-16 | Discharge: 2022-09-17 | Disposition: A | Discharge status: Other Acute Inpatient Hospital | Payer: Medicaid Other | Attending: Emergency Medicine | Admitting: Emergency Medicine

## 2022-09-16 ENCOUNTER — Encounter (HOSPITAL_COMMUNITY): Payer: Self-pay

## 2022-09-16 DIAGNOSIS — Z5900 Homelessness unspecified: Secondary | ICD-10-CM

## 2022-09-16 DIAGNOSIS — R45851 Suicidal ideations: Secondary | ICD-10-CM | POA: Diagnosis not present

## 2022-09-16 DIAGNOSIS — R4589 Other symptoms and signs involving emotional state: Secondary | ICD-10-CM

## 2022-09-16 DIAGNOSIS — R440 Auditory hallucinations: Secondary | ICD-10-CM | POA: Insufficient documentation

## 2022-09-16 DIAGNOSIS — Z1152 Encounter for screening for COVID-19: Secondary | ICD-10-CM | POA: Diagnosis not present

## 2022-09-16 DIAGNOSIS — F17210 Nicotine dependence, cigarettes, uncomplicated: Secondary | ICD-10-CM | POA: Diagnosis not present

## 2022-09-16 DIAGNOSIS — E860 Dehydration: Secondary | ICD-10-CM

## 2022-09-16 DIAGNOSIS — Z9189 Other specified personal risk factors, not elsewhere classified: Secondary | ICD-10-CM

## 2022-09-16 LAB — CBC WITH DIFFERENTIAL/PLATELET
Abs Immature Granulocytes: 0.03 10*3/uL (ref 0.00–0.07)
Basophils Absolute: 0 10*3/uL (ref 0.0–0.1)
Basophils Relative: 0 %
Eosinophils Absolute: 0.1 10*3/uL (ref 0.0–0.5)
Eosinophils Relative: 1 %
HCT: 42.6 % (ref 39.0–52.0)
Hemoglobin: 14.5 g/dL (ref 13.0–17.0)
Immature Granulocytes: 0 %
Lymphocytes Relative: 33 %
Lymphs Abs: 2.5 10*3/uL (ref 0.7–4.0)
MCH: 29.7 pg (ref 26.0–34.0)
MCHC: 34 g/dL (ref 30.0–36.0)
MCV: 87.1 fL (ref 80.0–100.0)
Monocytes Absolute: 0.5 10*3/uL (ref 0.1–1.0)
Monocytes Relative: 7 %
Neutro Abs: 4.3 10*3/uL (ref 1.7–7.7)
Neutrophils Relative %: 59 %
Platelets: 229 10*3/uL (ref 150–400)
RBC: 4.89 MIL/uL (ref 4.22–5.81)
RDW: 13.2 % (ref 11.5–15.5)
WBC: 7.4 10*3/uL (ref 4.0–10.5)
nRBC: 0 % (ref 0.0–0.2)

## 2022-09-16 LAB — RAPID URINE DRUG SCREEN, HOSP PERFORMED
Amphetamines: NOT DETECTED
Barbiturates: NOT DETECTED
Benzodiazepines: NOT DETECTED
Cocaine: NOT DETECTED
Opiates: NOT DETECTED
Tetrahydrocannabinol: NOT DETECTED

## 2022-09-16 LAB — VALPROIC ACID LEVEL: Valproic Acid Lvl: <10 ug/mL — ABNORMAL LOW (ref 50.0–100.0)

## 2022-09-16 LAB — COMPREHENSIVE METABOLIC PANEL
ALT: 36 U/L (ref 0–44)
AST: 34 U/L (ref 15–41)
Albumin: 4.2 g/dL (ref 3.5–5.0)
Alkaline Phosphatase: 42 U/L (ref 38–126)
Anion gap: 7 (ref 5–15)
BUN: 18 mg/dL (ref 6–20)
CO2: 26 mmol/L (ref 22–32)
Calcium: 8.6 mg/dL — ABNORMAL LOW (ref 8.9–10.3)
Chloride: 104 mmol/L (ref 98–111)
Creatinine, Ser: 0.87 mg/dL (ref 0.61–1.24)
GFR, Estimated: >60 mL/min (ref >60)
Glucose, Bld: 80 mg/dL (ref 70–99)
Potassium: 3.9 mmol/L (ref 3.5–5.1)
Sodium: 137 mmol/L (ref 135–145)
Total Bilirubin: 0.5 mg/dL (ref 0.3–1.2)
Total Protein: 6.8 g/dL (ref 6.5–8.1)

## 2022-09-16 LAB — SARS CORONAVIRUS 2 BY RT PCR: SARS Coronavirus 2 by RT PCR: NEGATIVE

## 2022-09-16 LAB — CK: Total CK: 498 U/L — ABNORMAL HIGH (ref 49–397)

## 2022-09-16 LAB — ACETAMINOPHEN LEVEL: Acetaminophen (Tylenol), Serum: <10 ug/mL — ABNORMAL LOW (ref 10–30)

## 2022-09-16 LAB — SALICYLATE LEVEL: Salicylate Lvl: <7 mg/dL — ABNORMAL LOW (ref 7.0–30.0)

## 2022-09-16 LAB — ETHANOL: Alcohol, Ethyl (B): <10 mg/dL (ref <10)

## 2022-09-16 MED ORDER — PRAZOSIN HCL 1 MG PO CAPS
2.0000 mg | ORAL_CAPSULE | Freq: Every day | ORAL | Status: DC
  Administered 2022-09-16: 2 mg via ORAL
  Filled 2022-09-16: qty 2

## 2022-09-16 MED ORDER — HYDROXYZINE HCL 10 MG PO TABS
25.0000 mg | ORAL_TABLET | Freq: Four times a day (QID) | ORAL | Status: DC | PRN
  Administered 2022-09-16: 25 mg via ORAL
  Filled 2022-09-16: qty 3

## 2022-09-16 MED ORDER — ACETAMINOPHEN 325 MG PO TABS
650.0000 mg | ORAL_TABLET | Freq: Once | ORAL | Status: AC
  Administered 2022-09-16: 650 mg via ORAL
  Filled 2022-09-16: qty 2

## 2022-09-16 MED ORDER — LITHIUM CARBONATE ER 300 MG PO TBCR: 300.0000 mg | EXTENDED_RELEASE_TABLET | Freq: Three times a day (TID) | ORAL | Status: DC

## 2022-09-16 MED ORDER — QUETIAPINE FUMARATE 100 MG PO TABS: 100.0000 mg | ORAL_TABLET | Freq: Every day | ORAL | Status: DC

## 2022-09-16 MED ORDER — DIVALPROEX SODIUM 500 MG PO DR TAB: 500.0000 mg | DELAYED_RELEASE_TABLET | Freq: Two times a day (BID) | ORAL | Status: DC

## 2022-09-16 MED ORDER — LACTATED RINGERS IV BOLUS
1000.0000 mL | Freq: Once | INTRAVENOUS | Status: AC
  Administered 2022-09-16: 1000 mL via INTRAVENOUS

## 2022-09-16 MED ORDER — OLANZAPINE 5 MG PO TABS
15.0000 mg | ORAL_TABLET | Freq: Every day | ORAL | Status: DC
  Administered 2022-09-16: 15 mg via ORAL
  Filled 2022-09-16: qty 1

## 2022-09-16 MED ORDER — OLANZAPINE 5 MG PO TABS
5.0000 mg | ORAL_TABLET | Freq: Every morning | ORAL | Status: DC
  Administered 2022-09-17: 5 mg via ORAL
  Filled 2022-09-16: qty 1

## 2022-09-16 NOTE — Progress Notes (Signed)
LCSW Progress Note  [id number]   Denney Willy  09/16/2022  3:56 PM  Description:   Inpatient Psychiatric Referral  Patient was recommended inpatient per [facility]. There are no available beds at [facility]. Patient was referred to the following facilities:   Destination  Service Provider Address Phone Fax  [facility] [place]  [address] [place], [place] [address] [phone]  [facility] [address] [place]  [address] [place] [address] [phone]  [facility]  [address] [place], [place] [address] [phone]  [facility] [address] [place] [address] [phone]  [facility]  [address] [place] [address] [phone]  [facility]  [address] [place], [place] [address] [phone]  [facility]  [address] [place] [address] [phone]  [facility]  [address], [employer] [place] [address] [phone]  [facility]  [address] [place] [address] [phone]  [facility]  [address] [place] [address] [phone]  [facility]  [address] [place] [address] [phone]  [facility]  [address] [place] [address] [phone]  [facility]  [address], [place] [address] [phone]  [facility]  [address], [place] [address] [phone]  [facility]  [address] [place] [address] [phone]  [facility]  [address] [place] [address] [phone]  [facility]  [address] [place] [address] [phone]  [facility]  [address] [place] [address] [phone]  [facility]  [address], [place] [address] [phone]  [facility]  [address] [place] [address] [phone]  [facility]  [address] [place] [address] [phone]  [facility]  [address] [place] [address] [phone]  [facility] [address]  [address], [place] [address] [phone]  [facility]  [address] [place] [address] [phone]    Situation ongoing, CSW to continue following and update chart as more information becomes available.      Asencion Islam  09/16/2022 3:56 PM

## 2022-09-16 NOTE — Consult Note (Cosign Needed Addendum)
BH ED ASSESSMENT   Reason for Consult: SI with plan, high risk  Referring Physician:  Achille Richiley Ransom, PA-C Patient Identification: Jon RipaJamie Lown MRN:  [id number] ED Chief Complaint: Suicidal ideation  Diagnosis:  Principal Problem:   Suicidal ideation Active Problems:   At high risk for self harm   Homeless   ED Assessment Time Calculation: Start Time: 1430 Stop Time: 1510 Total Time in Minutes (Assessment Completion): 40    HPI: Per Triage Note: Patient brought to the ER by his parole officer due to having suicidal thoughts and hallucinations. Reports being off of his psych meds for about 3 days. States he is hearing voices telling him to hurt himself. SI plan to walk out on train tracks.    Subjective: Jon Clayton, 27 y.o., male patient seen face to face by this provider, consulted with Dr. Lucianne MussKumar; and chart reviewed on 09/16/22.  On evaluation Jon RipaJamie Rotunno reports that he is back in the ED today due to suicidal ideation with a plan to walk out on train tracks. On evaluation patient is alert and oriented x3, speech is clear and coherent. Patient's eye contact is good, mood is dysphoric, affect is appropriate. Patient's thought process is logical and thought content is within normal limits.  Patient states his appetite and sleep are poor, due to not being able to get enough sleep while being homeless, anxiety, having to watch his surroundings.  [facility] but he cannot go back because he stayed too long. Patient endorses suicidal ideation with plans to walk out on train tracks. Patient denies HI, AVH, or paranoia. Patient states he currently does not have any AH but he has auditory hallucinations, that come and go that tell him to kill himself and says things like he deserves it.  There is no indication that the patient is responding to internal stimuli and no delusion noted.     [facility] and past several ED visits for mental health  evaluation and homelessness.   During assessment, patient reported that [facility] to 4 days [facility]. Patient not able to contract for safety. Patient denies using any substances or alcohol before presenting to the ED today.  Patient UDS is negative, BAL less than 10. Recommends inpatient psychiatric admission for stabilization and treatment.  Past Psychiatric History: History: Suicidal ideation, MDD, cannabis use disorder, PTSD, antisocial behavior, personality disorder, polysubstance abuse.     Risk to Self or Others: Is the patient at risk to self? Yes Has the patient been a risk to self in the past 6 months? Yes Is the patient a risk to others?  No Has the patient been a risk to others in the past 6 months? No   [place] Scale:  Flowsheet Row ED [facility]20[facility] Most recent reading at 09/16/2022 10:30 AM ED [facility]18[facility]2022 [facility] [place] Most recent reading at 08/18/2022  6:54 PM ED [facility]2022 [facility] Most recent reading at 08/18/2022 11:22 AM  C-SSRS RISK CATEGORY High Risk High Risk High Risk       AIMS:  , , ,  ,   ASAM:    Substance Abuse:     Past Medical History:  Past Medical History:  Diagnosis Date   Depression    Psychosis    PTSD (post-traumatic stress disorder)  Past Surgical History:  Procedure Laterality Date   NASAL SINUS SURGERY Bilateral    TONSILLECTOMY     Family History: History reviewed. No pertinent family history.  Social History:  Social History   Substance and Sexual Activity  Alcohol Use Not Currently   Comment: reports periodic alcohol use     Social History   Substance and Sexual Activity  Drug Use Yes   Frequency: 1.0 times per week   Types: Marijuana   Comment: 1/3 gm daily    Social History   Socioeconomic History   Marital status: Single     Spouse name: Not on file   Number of children: Not on file   Years of education: Not on file   Highest education level: 8th grade  Occupational History   Not on file  Tobacco Use   Smoking status: Every Day    Packs/day: 1.00    Years: 11.00    Additional pack years: 0.00    Total pack years: 11.00    Types: Cigarettes   Smokeless tobacco: Never  Vaping Use   Vaping Use: Never used  Substance and Sexual Activity   Alcohol use: Not Currently    Comment: reports periodic alcohol use   Drug use: Yes    Frequency: 1.0 times per week    Types: Marijuana    Comment: 1/3 gm daily   Sexual activity: Not Currently  Other Topics Concern   Not on file  Social History Narrative   Not on file   Social Determinants of Health   Financial Resource Strain: Not on file  Food Insecurity: Not on file  Transportation Needs: Not on file  Physical Activity: Not on file  Stress: Not on file  Social Connections: Not on file      Allergies:   Allergies  Allergen Reactions   Amoxicillin Anaphylaxis and Rash   Penicillins Anaphylaxis and Rash   Other Swelling and Other (See Comments)    Pt said he is allergic to tapioca. Causes big knots to appear on his tongue.   Pertussis Vaccines Hives and Other (See Comments)    Per MD, said he was allergic to a medication in the vaccine   Tape Other (See Comments)    Bandages, once removed, leave the skin swollen/puffy/irritated where they were placed   Blue Mussel [Mytilus] Itching, Swelling, Rash and Other (See Comments)    Itchy throat    Labs:  Results for orders placed or performed during the hospital encounter of 09/16/22 (from the past 48 hour(s))  Rapid urine drug screen (hospital performed)     Status: None   Collection Time: 09/16/22 10:44 AM  Result Value Ref Range   Opiates NONE DETECTED NONE DETECTED   Cocaine NONE DETECTED NONE DETECTED   Benzodiazepines NONE DETECTED NONE DETECTED   Amphetamines NONE DETECTED NONE DETECTED    Tetrahydrocannabinol NONE DETECTED NONE DETECTED   Barbiturates NONE DETECTED NONE DETECTED    Comment: (NOTE) DRUG SCREEN FOR MEDICAL PURPOSES ONLY.  IF CONFIRMATION IS NEEDED FOR ANY PURPOSE, NOTIFY LAB WITHIN 5 DAYS.  LOWEST DETECTABLE LIMITS FOR URINE DRUG SCREEN Drug Class                     Cutoff (ng/mL) Amphetamine and metabolites    1000 Barbiturate and metabolites    200 Benzodiazepine                 200 Opiates and metabolites  300 Cocaine and metabolites        300 THC                            50 Performed at [facility], [address] [place], [place] [address]   Comprehensive metabolic panel     Status: Abnormal   Collection Time: 09/16/22 11:50 AM  Result Value Ref Range   Sodium 137 135 - 145 mmol/L   Potassium 3.9 3.5 - 5.1 mmol/L   Chloride 104 98 - 111 mmol/L   CO2 26 22 - 32 mmol/L   Glucose, Bld 80 70 - 99 mg/dL    Comment: Glucose reference range applies only to samples taken after fasting for at least 8 hours.   BUN 18 6 - 20 mg/dL   Creatinine, Ser 6.04 0.61 - 1.24 mg/dL   Calcium 8.6 (L) 8.9 - 10.3 mg/dL   Total Protein 6.8 6.5 - 8.1 g/dL   Albumin 4.2 3.5 - 5.0 g/dL   AST 34 15 - 41 U/L   ALT 36 0 - 44 U/L   Alkaline Phosphatase 42 38 - 126 U/L   Total Bilirubin 0.5 0.3 - 1.2 mg/dL   GFR, Estimated >54 >09 mL/min    Comment: (NOTE) Calculated using the CKD-EPI Creatinine Equation (2021)    Anion gap 7 5 - 15    Comment: Performed at [facility], [address], [place], [place] [address]  Ethanol     Status: None   Collection Time: 09/16/22 11:50 AM  Result Value Ref Range   Alcohol, Ethyl (B) <10 <10 mg/dL    Comment: (NOTE) Lowest detectable limit for serum alcohol is 10 mg/dL.  For medical purposes only. Performed at [facility], [address], [place], [place] [address]   CBC with Diff     Status: None   Collection Time: 09/16/22 11:50 AM  Result Value Ref  Range   WBC 7.4 4.0 - 10.5 K/uL   RBC 4.89 4.22 - 5.81 MIL/uL   Hemoglobin 14.5 13.0 - 17.0 g/dL   HCT 56.2 13.0 - 86.5 %   MCV 87.1 80.0 - 100.0 fL   MCH 29.7 26.0 - 34.0 pg   MCHC 34.0 30.0 - 36.0 g/dL   RDW 78.4 69.6 - 29.5 %   Platelets 229 150 - 400 K/uL   nRBC 0.0 0.0 - 0.2 %   Neutrophils Relative % 59 %   Neutro Abs 4.3 1.7 - 7.7 K/uL   Lymphocytes Relative 33 %   Lymphs Abs 2.5 0.7 - 4.0 K/uL   Monocytes Relative 7 %   Monocytes Absolute 0.5 0.1 - 1.0 K/uL   Eosinophils Relative 1 %   Eosinophils Absolute 0.1 0.0 - 0.5 K/uL   Basophils Relative 0 %   Basophils Absolute 0.0 0.0 - 0.1 K/uL   Immature Granulocytes 0 %   Abs Immature Granulocytes 0.03 0.00 - 0.07 K/uL    Comment: Performed at [facility], [address] [place], [place] [address]  CK     Status: Abnormal   Collection Time: 09/16/22 11:50 AM  Result Value Ref Range   Total CK 498 (H) 49 - 397 U/L    Comment: Performed at [facility], [address] [place], [place] [address]  Acetaminophen level     Status: Abnormal   Collection Time: 09/16/22 11:50 AM  Result Value Ref Range  Acetaminophen (Tylenol), Serum <10 (L) 10 - 30 ug/mL    Comment: (NOTE) Therapeutic concentrations vary significantly. A range of 10-30 ug/mL  may be an effective concentration for many patients. However, some  are best treated at concentrations outside of this range. Acetaminophen concentrations >150 ug/mL at 4 hours after ingestion  and >50 ug/mL at 12 hours after ingestion are often associated with  toxic reactions.  Performed at [facility], [address] [place], [place] [id number]   Salicylate level     Status: Abnormal   Collection Time: 09/16/22 11:50 AM  Result Value Ref Range   Salicylate Lvl <7.0 (L) 7.0 - 30.0 mg/dL    Comment: Performed at [facility], [address] [place], [place] [id number]    Current Facility-Administered  Medications  Medication Dose Route Frequency Provider Last Rate Last Admin   hydrOXYzine (ATARAX) tablet 25 mg  25 mg Oral Q6H PRN Motley-Mangrum, Zaniel Marineau A, PMHNP       OLANZapine (ZYPREXA) tablet 15 mg  15 mg Oral QHS Motley-Mangrum, Lundon Verdejo A, PMHNP       [START ON 09/17/2022] OLANZapine (ZYPREXA) tablet 5 mg  5 mg Oral q AM Motley-Mangrum, Dafne Nield A, PMHNP       prazosin (MINIPRESS) capsule 2 mg  2 mg Oral QHS Motley-Mangrum, Festus Pursel A, PMHNP       Current Outpatient Medications  Medication Sig Dispense Refill   acetaminophen (TYLENOL) 500 MG tablet Take 1,000 mg by mouth every 6 (six) hours as needed for mild pain or headache.     albuterol (VENTOLIN HFA) 108 (90 Base) MCG/ACT inhaler Inhale 2 puffs into the lungs every 6 (six) hours as needed for wheezing or shortness of breath. 6.7 g 0   divalproex (DEPAKOTE) 500 MG DR tablet Take 500 mg by mouth 2 (two) times daily.     hydrOXYzine (VISTARIL) 25 MG capsule Take 25 mg by mouth every 6 (six) hours as needed for anxiety.     lithium carbonate (LITHOBID) 300 MG ER tablet Take 300 mg by mouth 3 (three) times daily.     OLANZapine (ZYPREXA) 15 MG tablet Take 15 mg by mouth at bedtime.     OLANZapine (ZYPREXA) 5 MG tablet Take 5 mg by mouth in the morning.     prazosin (MINIPRESS) 2 MG capsule Take 2 mg by mouth at bedtime.     QUEtiapine (SEROQUEL) 100 MG tablet Take 100 mg by mouth at bedtime.     QUEtiapine (SEROQUEL) 200 MG tablet Take 200 mg by mouth every 8 (eight) hours.     Vitamin D, Ergocalciferol, (DRISDOL) 1.25 MG (50000 UNIT) CAPS capsule Take 1 capsule (50,000 Units total) by mouth every 7 (seven) days. (Patient not taking: Reported on 09/16/2022) 4 capsule 2    Musculoskeletal:  Patient observed resting in bed.  Psychiatric Specialty Exam: Presentation  General Appearance:  Appropriate for Environment  Eye Contact: Fair  Speech: Clear and Coherent  Speech Volume: Normal  Handedness: Right   Mood and Affect   Mood: Depressed  Affect: Flat   Thought Process  Thought Processes: Coherent  Descriptions of Associations:Intact  Orientation:Full (Time, Place and Person)  Thought Content:WDL  History of Schizophrenia/Schizoaffective disorder:Yes (Pt reports that he has been told that he has diagnosis of schizoffective d/o)  Duration of Psychotic Symptoms:Greater than six months  Hallucinations:Hallucinations: None  Ideas of Reference:None  Suicidal Thoughts:Suicidal Thoughts: Yes, Passive SI Active Intent and/or Plan: With Intent; With Plan  Homicidal Thoughts:Homicidal  Thoughts: No   Sensorium  Memory: Immediate Good; Recent Good; Remote Good  Judgment: Fair  Insight: Fair   Art therapist  Concentration: Fair  Attention Span: Good  Recall: Good  Fund of Knowledge: Good  Language: Good   Psychomotor Activity  Psychomotor Activity: Psychomotor Activity: Normal   Assets  Assets: Communication Skills; Physical Health    Sleep  Sleep: Sleep: Fair   Physical Exam: Physical Exam Neurological:     Mental Status: He is alert.  Psychiatric:        Attention and Perception: Attention normal.        Mood and Affect: Mood is depressed. Affect is flat.        Speech: Speech normal.        Behavior: Behavior is cooperative.        Thought Content: Thought content includes suicidal ideation. Thought content includes suicidal plan.        Cognition and Memory: Cognition and memory normal.        Judgment: Judgment is impulsive.    Review of Systems  Constitutional: Negative.   Respiratory: Negative.    Musculoskeletal: Negative.   Psychiatric/Behavioral:  Positive for depression and suicidal ideas.    Blood pressure 120/70, pulse 89, temperature 98.2 F (36.8 C), temperature source Oral, resp. rate 16, height 5\' 7"  (1.702 m), weight 88 kg, SpO2 98 %. Body mass index is 30.38 kg/m.   Medical Decision Making: Patient case review and  discussed with Dr. Lucianne Muss. Patient needs inpatient psychiatric admission for stabilization and treatment. Restarted patient on home medications, and will chek his Depakote and lithium level, before starting those medications.  Pertinent labs: ECG 09/16/22 Qtc 442.   Disposition: Recommend psychiatric Inpatient admission when medically cleared.  Alona Bene, PMHNP 09/16/2022 3:23 PM

## 2022-09-16 NOTE — ED Triage Notes (Signed)
Patient brought to the ER by his parole officer due to having suicidal thoughts and hallucinations. Reports being off of his psych meds for about 3 days. States he is hearing voices telling him to hurt himself. SI plan to [place] train tracks.

## 2022-09-16 NOTE — ED Notes (Signed)
Pt asleep, do not want to wake for pain assessment

## 2022-09-16 NOTE — ED Provider Notes (Signed)
[place] EMERGENCY DEPARTMENT AT [facility] Provider Note   CSN: [id number] Arrival date & time: 09/16/22  1009     History Chief Complaint  Patient presents with   Suicidal    Jon [place] is a 27 y.o. male with history of PTSD, schizoaffective disorder, suicidal ideations with attempts in the past, polysubstance use, currently experiencing homelessness presents emerged from today for evaluation of suicidal ideations with plan as well as auditory hallucinations.  Patient reports he is hearing voices telling him to hurt himself.  His plan for suicide [place] train [place].  He was brought in by his parole officer due to his suicidal thoughts hallucinations.  He reports has been off his medications for for psych for the past 3 days.  He thinks he is on Zyprexa but does not know what dose or how often he takes it.  He reports he was on Seroquel in the past however he overdosed on it they took him off of it.  He denies any other medical problems.  Allergic to penicillins.  He denies any chest pain, shortness breath, abdominal pain.  Denies any ingesting any substances.  He reports his last drug use was around 3 months ago with meth.  He denies any IV drug use ever.  Reports he had some alcohol yesterday however is not a daily drinker.  HPI     Home Medications Prior to Admission medications   Medication Sig Start Date End Date Taking? Authorizing Provider  acetaminophen (TYLENOL) 500 MG tablet Take 1,000 mg by mouth every 6 (six) hours as needed for mild pain or headache.   Yes [provider]  albuterol (VENTOLIN HFA) 108 (90 Base) MCG/ACT inhaler Inhale 2 puffs into the lungs every 6 (six) hours as needed for wheezing or shortness of breath. 07/07/22  Yes Princess Bruins, DO  divalproex (DEPAKOTE) 500 MG DR tablet Take 500 mg by mouth 2 (two) times daily. 07/19/22 09/16/22 Yes [provider]  hydrOXYzine (VISTARIL) 25 MG capsule Take 25 mg by mouth every  6 (six) hours as needed for anxiety.   Yes [provider]  lithium carbonate (LITHOBID) 300 MG ER tablet Take 300 mg by mouth 3 (three) times daily.   Yes [provider]  OLANZapine (ZYPREXA) 15 MG tablet Take 15 mg by mouth at bedtime.   Yes [provider]  OLANZapine (ZYPREXA) 5 MG tablet Take 5 mg by mouth in the morning.   Yes [provider]  prazosin (MINIPRESS) 2 MG capsule Take 2 mg by mouth at bedtime. 08/01/22  Yes [provider]  QUEtiapine (SEROQUEL) 100 MG tablet Take 100 mg by mouth at bedtime. 08/01/22  Yes [provider]  QUEtiapine (SEROQUEL) 200 MG tablet Take 200 mg by mouth every 8 (eight) hours. 08/01/22  Yes [provider]  Vitamin D, Ergocalciferol, (DRISDOL) 1.25 MG (50000 UNIT) CAPS capsule Take 1 capsule (50,000 Units total) by mouth every 7 (seven) days. Patient not taking: Reported on 09/16/2022 08/17/22   Mayers, Cari S, PA-C      Allergies    Amoxicillin, Penicillins, Other, Pertussis vaccines, Tape, and Blue mussel [mytilus]    Review of Systems   Review of Systems  Constitutional:  Negative for chills and fever.  Respiratory:  Negative for shortness of breath.   Cardiovascular:  Negative for chest pain.  Gastrointestinal:  Negative for abdominal pain, constipation, diarrhea, nausea and vomiting.  Genitourinary:  Negative for dysuria and hematuria.  Neurological:  Negative for headaches.  Psychiatric/Behavioral:  Positive for dysphoric mood, hallucinations and suicidal ideas. Negative for self-injury.     Physical Exam Updated Vital Signs BP 120/70 (BP Location: Right Arm)   Pulse 89   Temp 98.2 F (36.8 C) (Oral)   Resp 16   Ht 5\' 7"  (1.702 m)   Wt 88 kg   SpO2 98%   BMI 30.38 kg/m  Physical Exam Vitals and nursing note reviewed.  Constitutional:      General: He is not in acute distress.    Appearance: Normal appearance. He is not toxic-appearing.     Comments: disheveled  Eyes:      General: No scleral icterus. Cardiovascular:     Rate and Rhythm: Tachycardia present.  Pulmonary:     Effort: Pulmonary effort is normal. No respiratory distress.     Breath sounds: Normal breath sounds.  Skin:    General: Skin is dry.  Neurological:     General: No focal deficit present.     Mental Status: He is alert. Mental status is at baseline.  Psychiatric:        Attention and Perception: He perceives auditory hallucinations.        Mood and Affect: Mood is depressed.        Speech: Speech normal.        Behavior: Behavior is cooperative.        Thought Content: Thought content includes suicidal ideation. Thought content does not include homicidal ideation. Thought content includes suicidal plan. Thought content does not include homicidal plan.     Comments: Does not appear to be responding to any internal stimuli however does report auditory hallucinations     ED Results / Procedures / Treatments   Labs (all labs ordered are listed, but only abnormal results are displayed) Labs Reviewed  COMPREHENSIVE METABOLIC PANEL - Abnormal; Notable for the following components:      Result Value   Calcium 8.6 (*)    All other components within normal limits  CK - Abnormal; Notable for the following components:   Total CK 498 (*)    All other components within normal limits  ACETAMINOPHEN LEVEL - Abnormal; Notable for the following components:   Acetaminophen (Tylenol), Serum <10 (*)    All other components within normal limits  SALICYLATE LEVEL - Abnormal; Notable for the following components:   Salicylate Lvl <7.0 (*)    All other components within normal limits  RAPID URINE DRUG SCREEN, HOSP PERFORMED  ETHANOL  CBC WITH DIFFERENTIAL/PLATELET  LITHIUM LEVEL  VALPROIC ACID LEVEL    EKG EKG Interpretation  Date/Time:  Friday September 16 2022 11:50:19 EDT Ventricular Rate:  89 PR Interval:  148 QRS Duration: 86 QT Interval:  364 QTC Calculation: 442 R  Axis:   53 Text Interpretation: Normal sinus rhythm no acute ST/T changes similar to Mar 2024 Confirmed by Pricilla LovelessGoldston, Scott [phone]) on 09/16/2022 1:09:49 PM  Radiology No results found.  Procedures Procedures   Medications Ordered in ED Medications  hydrOXYzine (ATARAX) tablet 25 mg (has no administration in time range)  OLANZapine (ZYPREXA) tablet 15 mg (has no administration in time range)  OLANZapine (ZYPREXA) tablet 5 mg (has no administration in time range)  prazosin (MINIPRESS) capsule 2 mg (has no administration in time range)  lactated ringers bolus 1,000 mL (0 mLs Intravenous Stopped 09/16/22 1220)  acetaminophen (TYLENOL) tablet 650 mg (650 mg Oral Given 09/16/22 1244)    ED Course/ Medical Decision Making/ A&P   {  Medical Decision Making Amount and/or Complexity of Data Reviewed Labs: ordered.  Risk OTC drugs.  27 y.o. male presents to the ER today for evaluation of suicidal ideations with plan and auditory hallucinations. Differential diagnosis includes but is not limited to known psych disorder, electrolyte abnormality. Vital signs show some tachycardia, will add on a CK given the patient's homelessness and likely dehydration. Physical exam as noted above.   Labs ordered.  I independently reviewed and interpreted the patient's labs.  CBC without leukocytosis or anemia.  Acetaminophen, salicylate, and ethanol levels are negative.  UDS is negative.  CMP shows mildly decreased calcium otherwise no electrolyte or LFT abnormality.  CK is slightly elevated at 498.  Due to the patient's elevated CK, will order him some IV fluids.  He is also requesting some Tylenol for his chronic dental pain.  Tachycardia has improved after fluids, likely from dehydration.  Patient is medically clear.  Awaiting pharmacy tech to reconcile medications.  Patient is placed under IVC given his documented history of high risk suicidal behavior with previous attempts in the past.  Psychiatry NP  ordered his home medications.   Portions of this report may have been transcribed using voice recognition software. Every effort was made to ensure accuracy; however, inadvertent computerized transcription errors may be present.   Final Clinical Impression(s) / ED Diagnoses Final diagnoses:  None    Rx / DC Orders ED Discharge Orders     None         Achille RichRansom, Easter Kennebrew, PA-C 09/16/22 1510    Pricilla LovelessGoldston, Scott, MD 09/17/22 [id number]

## 2022-09-16 NOTE — ED Notes (Signed)
Pt dressed in burgundy scrubs and pt belongings put in bags.  4 bags placed in locker#29.  3 white belongings bags and 1 grey bag.  Pt wannded and assigned to room #29.

## 2022-09-17 ENCOUNTER — Other Ambulatory Visit (HOSPITAL_COMMUNITY)
Admission: EM | Admit: 2022-09-17 | Discharge: 2022-09-21 | Disposition: A | Discharge status: Home / Self Care | Payer: Medicaid Other | Attending: Psychiatry | Admitting: Psychiatry

## 2022-09-17 ENCOUNTER — Other Ambulatory Visit: Payer: Self-pay

## 2022-09-17 DIAGNOSIS — Z5900 Homelessness unspecified: Secondary | ICD-10-CM | POA: Insufficient documentation

## 2022-09-17 DIAGNOSIS — R441 Visual hallucinations: Secondary | ICD-10-CM | POA: Insufficient documentation

## 2022-09-17 DIAGNOSIS — R440 Auditory hallucinations: Secondary | ICD-10-CM | POA: Insufficient documentation

## 2022-09-17 DIAGNOSIS — R45851 Suicidal ideations: Secondary | ICD-10-CM

## 2022-09-17 DIAGNOSIS — Z560 Unemployment, unspecified: Secondary | ICD-10-CM | POA: Insufficient documentation

## 2022-09-17 DIAGNOSIS — Z9151 Personal history of suicidal behavior: Secondary | ICD-10-CM | POA: Insufficient documentation

## 2022-09-17 DIAGNOSIS — F32A Depression, unspecified: Secondary | ICD-10-CM | POA: Diagnosis present

## 2022-09-17 DIAGNOSIS — F17210 Nicotine dependence, cigarettes, uncomplicated: Secondary | ICD-10-CM | POA: Insufficient documentation

## 2022-09-17 LAB — LITHIUM LEVEL: Lithium Lvl: <0.06 mmol/L — ABNORMAL LOW (ref 0.60–1.20)

## 2022-09-17 MED ORDER — PRAZOSIN HCL 2 MG PO CAPS
2.0000 mg | ORAL_CAPSULE | Freq: Every day | ORAL | Status: DC
  Administered 2022-09-17 – 2022-09-20 (×4): 2 mg via ORAL
  Filled 2022-09-17 (×2): qty 1
  Filled 2022-09-17: qty 14
  Filled 2022-09-17 (×2): qty 1

## 2022-09-17 MED ORDER — ACETAMINOPHEN 325 MG PO TABS
650.0000 mg | ORAL_TABLET | Freq: Four times a day (QID) | ORAL | Status: DC | PRN
  Administered 2022-09-18 – 2022-09-20 (×8): 650 mg via ORAL
  Filled 2022-09-17 (×8): qty 2

## 2022-09-17 MED ORDER — TRAZODONE HCL 50 MG PO TABS
50.0000 mg | ORAL_TABLET | Freq: Every evening | ORAL | Status: DC | PRN
  Administered 2022-09-18 – 2022-09-20 (×4): 50 mg via ORAL
  Filled 2022-09-17 (×4): qty 1

## 2022-09-17 MED ORDER — OLANZAPINE 5 MG PO TABS
5.0000 mg | ORAL_TABLET | Freq: Every morning | ORAL | Status: DC
  Administered 2022-09-18 – 2022-09-21 (×4): 5 mg via ORAL
  Filled 2022-09-17 (×3): qty 1
  Filled 2022-09-17: qty 56
  Filled 2022-09-17: qty 1

## 2022-09-17 MED ORDER — OLANZAPINE 7.5 MG PO TABS
15.0000 mg | ORAL_TABLET | Freq: Every day | ORAL | Status: DC
  Administered 2022-09-17 – 2022-09-20 (×4): 15 mg via ORAL
  Filled 2022-09-17 (×4): qty 2

## 2022-09-17 MED ORDER — HYDROXYZINE HCL 25 MG PO TABS
25.0000 mg | ORAL_TABLET | Freq: Three times a day (TID) | ORAL | Status: DC | PRN
  Administered 2022-09-17 – 2022-09-20 (×4): 25 mg via ORAL
  Filled 2022-09-17 (×3): qty 1
  Filled 2022-09-17: qty 20
  Filled 2022-09-17: qty 1

## 2022-09-17 MED ORDER — MAGNESIUM HYDROXIDE 400 MG/5ML PO SUSP: 30.0000 mL | Freq: Every day | ORAL | Status: DC | PRN

## 2022-09-17 MED ORDER — ALUM & MAG HYDROXIDE-SIMETH 200-200-20 MG/5ML PO SUSP: 30.0000 mL | ORAL | Status: DC | PRN

## 2022-09-17 MED ORDER — ACETAMINOPHEN 325 MG PO TABS
650.0000 mg | ORAL_TABLET | Freq: Four times a day (QID) | ORAL | Status: DC | PRN
  Administered 2022-09-17: 650 mg via ORAL
  Filled 2022-09-17: qty 2

## 2022-09-17 NOTE — ED Notes (Signed)
Pt sleeping in recliner bed. RR even and unlabored. No noted distress. Will continue to monitor for safety 

## 2022-09-17 NOTE — Progress Notes (Signed)
[facility] Psych ED Progress Note  09/17/2022 12:38 PM Jon Clayton  MRN:  [id number]   Principal Problem: Suicidal ideation Diagnosis:  Principal Problem:   Suicidal ideation Active Problems:   At high risk for self harm   Homeless   ED Assessment Time Calculation: Start Time: 1130 Stop Time: 1150 Total Time in Minutes (Assessment Completion): 20    Subjective:  On evaluation today, the patient is sitting up in his bed. He is calm and cooperative during this assessment. He is disheveled.  His eye contact is good.  Speech is clear and coherent, normal pace and normal volume.  He reports his mood is "ok".  Affect is congruent with mood.  Thought process is coherent.  Thought content is logical.  States that his appetite and sleep are good while being here in the hospital.  He currently denies auditory and visual hallucinations.  No indication that she is responding to internal stimuli during this assessment.  No delusions elicited during this assessment. He denies homicidal ideations.  When provider asked patient if he was having any suicidal ideations, he states "yes I guess ".  Patient states that he has an appointment on Monday, September 19, 2022 at 8 AM with day [facility] residential, for housing.  Support, encouragement and reassurance provided about ongoing stressors and patient provided with opportunity for questions.     Call patient's parole officer Laretta Bolster, no answer left HIPAA compliant voicemail.   Past Psychiatric History: History: Suicidal ideation, MDD, cannabis use disorder, PTSD, antisocial behavior, personality disorder, polysubstance abuse.    [place] Scale:  Flowsheet Row ED from 09/16/2022 in [facility] Emergency Department at [facility] Most recent reading at 09/16/2022 10:30 AM ED from 08/18/2022 in [facility] Emergency Department at [facility] Most recent reading at 08/18/2022  6:54 PM ED from 08/18/2022 in [facility] Emergency Department at [facility] Most recent reading at 08/18/2022 11:22 AM  C-SSRS RISK CATEGORY High Risk High Risk High Risk       Past Medical History:  Past Medical History:  Diagnosis Date   Depression    Psychosis    PTSD (post-traumatic stress disorder)     Past Surgical History:  Procedure Laterality Date   NASAL SINUS SURGERY Bilateral    TONSILLECTOMY     Family History: History reviewed. No pertinent family history.  Social History:  Social History   Substance and Sexual Activity  Alcohol Use Not Currently   Comment: reports periodic alcohol use     Social History   Substance and Sexual Activity  Drug Use Yes   Frequency: 1.0 times per week   Types: Marijuana   Comment: 1/3 gm daily    Social History   Socioeconomic History   Marital status: Single    Spouse name: Not on file   Number of children: Not on file   Years of education: Not on file   Highest education level: 8th grade  Occupational History   Not on file  Tobacco Use   Smoking status: Every Day    Packs/day: 1.00    Years: 11.00    Additional pack years: 0.00    Total pack years: 11.00    Types: Cigarettes   Smokeless tobacco: Never  Vaping Use   Vaping Use: Never used  Substance and Sexual Activity   Alcohol use: Not Currently    Comment: reports periodic alcohol use   Drug use: Yes    Frequency: 1.0 times per week  Types: Marijuana    Comment: 1/3 gm daily   Sexual activity: Not Currently  Other Topics Concern   Not on file  Social History Narrative   Not on file   Social Determinants of Health   Financial Resource Strain: Not on file  Food Insecurity: Not on file  Transportation Needs: Not on file  Physical Activity: Not on file  Stress: Not on file  Social Connections: Not on file    Sleep: Good  Appetite:  Good  Current Medications: Current Facility-Administered Medications  Medication Dose Route Frequency Provider Last Rate Last Admin   hydrOXYzine (ATARAX) tablet 25 mg  25 mg  Oral Q6H PRN [place], Taje Littler A, PMHNP   25 mg at 09/16/22 2147   OLANZapine (ZYPREXA) tablet 15 mg  15 mg Oral QHS Clayton, Khylei Wilms A, PMHNP   15 mg at 09/16/22 2148   OLANZapine (ZYPREXA) tablet 5 mg  5 mg Oral q AM Clayton, Lesly Pontarelli A, PMHNP   5 mg at 09/17/22 0855   prazosin (MINIPRESS) capsule 2 mg  2 mg Oral QHS Clayton, Tiney Zipper A, PMHNP   2 mg at 09/16/22 2148   Current Outpatient Medications  Medication Sig Dispense Refill   acetaminophen (TYLENOL) 500 MG tablet Take 1,000 mg by mouth every 6 (six) hours as needed for mild pain or headache.     albuterol (VENTOLIN HFA) 108 (90 Base) MCG/ACT inhaler Inhale 2 puffs into the lungs every 6 (six) hours as needed for wheezing or shortness of breath. 6.7 g 0   divalproex (DEPAKOTE) 500 MG DR tablet Take 500 mg by mouth 2 (two) times daily.     hydrOXYzine (VISTARIL) 25 MG capsule Take 25 mg by mouth every 6 (six) hours as needed for anxiety.     lithium carbonate (LITHOBID) 300 MG ER tablet Take 300 mg by mouth 3 (three) times daily.     OLANZapine (ZYPREXA) 15 MG tablet Take 15 mg by mouth at bedtime.     OLANZapine (ZYPREXA) 5 MG tablet Take 5 mg by mouth in the morning.     prazosin (MINIPRESS) 2 MG capsule Take 2 mg by mouth at bedtime.     QUEtiapine (SEROQUEL) 100 MG tablet Take 100 mg by mouth at bedtime.     QUEtiapine (SEROQUEL) 200 MG tablet Take 200 mg by mouth every 8 (eight) hours.     Vitamin D, Ergocalciferol, (DRISDOL) 1.25 MG (50000 UNIT) CAPS capsule Take 1 capsule (50,000 Units total) by mouth every 7 (seven) days. (Patient not taking: Reported on 09/16/2022) 4 capsule 2    Lab Results:  Results for orders placed or performed during the hospital encounter of 09/16/22 (from the past 48 hour(s))  Rapid urine drug screen (hospital performed)     Status: None   Collection Time: 09/16/22 10:44 AM  Result Value Ref Range   Opiates NONE DETECTED NONE DETECTED   Cocaine NONE DETECTED NONE DETECTED    Benzodiazepines NONE DETECTED NONE DETECTED   Amphetamines NONE DETECTED NONE DETECTED   Tetrahydrocannabinol NONE DETECTED NONE DETECTED   Barbiturates NONE DETECTED NONE DETECTED    Comment: (NOTE) DRUG SCREEN FOR MEDICAL PURPOSES ONLY.  IF CONFIRMATION IS NEEDED FOR ANY PURPOSE, NOTIFY LAB WITHIN 5 DAYS.  LOWEST DETECTABLE LIMITS FOR URINE DRUG SCREEN Drug Class                     Cutoff (ng/mL) Amphetamine and metabolites    1000 Barbiturate and metabolites    200  Benzodiazepine                 200 Opiates and metabolites        300 Cocaine and metabolites        300 THC                            50 Performed at [facility], [address], [place], [place] [address]   Comprehensive metabolic panel     Status: Abnormal   Collection Time: 09/16/22 11:50 AM  Result Value Ref Range   Sodium 137 135 - 145 mmol/L   Potassium 3.9 3.5 - 5.1 mmol/L   Chloride 104 98 - 111 mmol/L   CO2 26 22 - 32 mmol/L   Glucose, Bld 80 70 - 99 mg/dL    Comment: Glucose reference range applies only to samples taken after fasting for at least 8 hours.   BUN 18 6 - 20 mg/dL   Creatinine, Ser 2.40 0.61 - 1.24 mg/dL   Calcium 8.6 (L) 8.9 - 10.3 mg/dL   Total Protein 6.8 6.5 - 8.1 g/dL   Albumin 4.2 3.5 - 5.0 g/dL   AST 34 15 - 41 U/L   ALT 36 0 - 44 U/L   Alkaline Phosphatase 42 38 - 126 U/L   Total Bilirubin 0.5 0.3 - 1.2 mg/dL   GFR, Estimated >97 >35 mL/min    Comment: (NOTE) Calculated using the CKD-EPI Creatinine Equation (2021)    Anion gap 7 5 - 15    Comment: Performed at [facility], [address] [place], [place] [address]  Ethanol     Status: None   Collection Time: 09/16/22 11:50 AM  Result Value Ref Range   Alcohol, Ethyl (B) <10 <10 mg/dL    Comment: (NOTE) Lowest detectable limit for serum alcohol is 10 mg/dL.  For medical purposes only. Performed at [facility], [address] [place], [place] [address]    CBC with Diff     Status: None   Collection Time: 09/16/22 11:50 AM  Result Value Ref Range   WBC 7.4 4.0 - 10.5 K/uL   RBC 4.89 4.22 - 5.81 MIL/uL   Hemoglobin 14.5 13.0 - 17.0 g/dL   HCT 41.9 62.2 - 29.7 %   MCV 87.1 80.0 - 100.0 fL   MCH 29.7 26.0 - 34.0 pg   MCHC 34.0 30.0 - 36.0 g/dL   RDW 98.9 21.1 - 94.1 %   Platelets 229 150 - 400 K/uL   nRBC 0.0 0.0 - 0.2 %   Neutrophils Relative % 59 %   Neutro Abs 4.3 1.7 - 7.7 K/uL   Lymphocytes Relative 33 %   Lymphs Abs 2.5 0.7 - 4.0 K/uL   Monocytes Relative 7 %   Monocytes Absolute 0.5 0.1 - 1.0 K/uL   Eosinophils Relative 1 %   Eosinophils Absolute 0.1 0.0 - 0.5 K/uL   Basophils Relative 0 %   Basophils Absolute 0.0 0.0 - 0.1 K/uL   Immature Granulocytes 0 %   Abs Immature Granulocytes 0.03 0.00 - 0.07 K/uL    Comment: Performed at [facility], [address], [place], [place] [address]  CK     Status: Abnormal   Collection Time: 09/16/22 11:50 AM  Result Value Ref Range   Total CK 498 (H) 49 - 397 U/L    Comment: Performed at [facility],  [address] [place], [place] [address]  Acetaminophen level     Status: Abnormal   Collection Time: 09/16/22 11:50 AM  Result Value Ref Range   Acetaminophen (Tylenol), Serum <10 (L) 10 - 30 ug/mL    Comment: (NOTE) Therapeutic concentrations vary significantly. A range of 10-30 ug/mL  may be an effective concentration for many patients. However, some  are best treated at concentrations outside of this range. Acetaminophen concentrations >150 ug/mL at 4 hours after ingestion  and >50 ug/mL at 12 hours after ingestion are often associated with  toxic reactions.  Performed at [facility], [address], [place], [place] [address]   Salicylate level     Status: Abnormal   Collection Time: 09/16/22 11:50 AM  Result Value Ref Range   Salicylate Lvl <7.0 (L) 7.0 - 30.0 mg/dL    Comment: Performed at [facility], [address], [place], [place] [address]  Valproic acid level     Status: Abnormal   Collection Time: 09/16/22  4:07 PM  Result Value Ref Range   Valproic Acid Lvl <10 (L) 50.0 - 100.0 ug/mL    Comment: RESULT CONFIRMED BY MANUAL DILUTION Performed at [facility], [address], [place], [place] [address]   SARS Coronavirus 2 by RT PCR (hospital order, performed in [facility] hospital lab) *cepheid single result test* Anterior Nasal Swab     Status: None   Collection Time: 09/16/22  4:07 PM   Specimen: Anterior Nasal Swab  Result Value Ref Range   SARS Coronavirus 2 by RT PCR NEGATIVE NEGATIVE    Comment: (NOTE) SARS-CoV-2 target nucleic acids are NOT DETECTED.  The SARS-CoV-2 RNA is generally detectable in upper and lower respiratory specimens during the acute phase of infection. The lowest concentration of SARS-CoV-2 viral copies this assay can detect is 250 copies / mL. A negative result does not preclude SARS-CoV-2 infection and should not be used as the sole basis for treatment or other patient management decisions.  A negative result may occur with improper specimen collection / handling, submission of specimen other than nasopharyngeal swab, presence of viral mutation(s) within the areas targeted by this assay, and inadequate number of viral copies (<250 copies / mL). A negative result must be combined with clinical observations, patient history, and epidemiological information.  Fact Sheet for Patients:   RoadLapTop.co.za  Fact Sheet for Healthcare Providers: http://kim-Sidor.com/  This test is not yet approved or  cleared by the [place] FDA and has been authorized for detection and/or diagnosis of SARS-CoV-2 by FDA under an Emergency Use Authorization (EUA).  This EUA will remain in effect (meaning this test can be used) for the duration of the COVID-19 declaration under Section  564(b)(1) of the Act, 21 U.S.C. section 360bbb-3(b)(1), unless the authorization is terminated or revoked sooner.  Performed at [facility], [address] [place], [place] [address]   Lithium level     Status: Abnormal   Collection Time: 09/17/22  9:02 AM  Result Value Ref Range   Lithium Lvl <0.06 (L) 0.60 - 1.20 mmol/L    Comment: Performed at [facility], [address], [place], [place] [address]    Blood Alcohol level:  Lab Results  Component Value Date   [facility] <10 09/16/2022   ETH <10 08/18/2022    Physical Findings:  CIWA:    COWS:     Musculoskeletal: Strength & Muscle Tone: within normal limits Gait &  Station: normal Patient leans: N/A  Psychiatric Specialty Exam:  Presentation  General Appearance:  Appropriate for Environment  Eye Contact: Fair  Speech: Clear and Coherent  Speech Volume: Normal  Handedness: Right   Mood and Affect  Mood: Euthymic  Affect: Flat   Thought Process  Thought Processes: Coherent  Descriptions of Associations:Intact  Orientation:Full (Time, Place and Person)  Thought Content:WDL  History of Schizophrenia/Schizoaffective disorder:Yes (Pt reports that he has been told that he has diagnosis of schizoffective d/o)  Duration of Psychotic Symptoms:Greater than six months  Hallucinations:Hallucinations: None  Ideas of Reference:None  Suicidal Thoughts:Suicidal Thoughts: Yes, Passive SI Active Intent and/or Plan: Without Plan  Homicidal Thoughts:Homicidal Thoughts: No   Sensorium  Memory: Immediate Good; Recent Fair  Judgment: Fair  Insight: Fair   Art therapist  Concentration: Fair  Attention Span: Good  Recall: Good  Fund of Knowledge: Good  Language: Good   Psychomotor Activity  Psychomotor Activity: Psychomotor Activity: Normal   Assets  Assets: Communication Skills; Physical Health   Sleep  Sleep: Sleep:  Good    Physical Exam: Physical Exam Pulmonary:     Effort: Pulmonary effort is normal.  Musculoskeletal:     Cervical back: Normal range of motion.  Neurological:     Mental Status: He is alert.  Psychiatric:        Attention and Perception: Attention normal.        Mood and Affect: Mood is depressed. Affect is flat.        Speech: Speech normal.        Behavior: Behavior is cooperative.        Thought Content: Thought content includes suicidal ideation.        Cognition and Memory: Memory normal.        Judgment: Judgment is impulsive.    Review of Systems  Psychiatric/Behavioral:  Positive for depression.    Blood pressure 111/66, pulse 82, temperature 97.9 F (36.6 C), temperature source Oral, resp. rate 16, height 5\' 7"  (1.702 m), weight 88 kg, SpO2 98 %. Body mass index is 30.38 kg/m.   Medical Decision Making: Patient case reviewed and discussed with Dr. Lucianne Muss.  Recommending continuous observation at behavioral health urgent care.    Jon [place], PMHNP 09/17/2022, 12:38 PM

## 2022-09-17 NOTE — ED Provider Notes (Signed)
[facility] Continuous Assessment Admission H&P  Date: 09/17/22 Patient Name: Jon Clayton MRN: [id number] Chief Complaint: "suicide with a plan to walk into a train"  Diagnoses:  Final diagnoses:  None    HPI: Jon Clayton is a 27 year old male with psychiatric history significant for depression, personality disorder, suicidal ideation and polysubstance use disorder (UDS this admission is negative).  Patient initially presented to [facility] reporting suicidal ideation with a plan to walk into a train in auditory hallucination.  Patient was evaluated and recommended for continuous assessment here at [facility].  This provider met with patient face-to-face and reviewed his chart upon his arrival to [facility].  On evaluation, patient is alert and oriented x 4; he is calm and cooperative.  Patient speaks in a clear tone, moderate rate, and pace with good eye contact.  Patient's mood is depressed with a congruent affect.  His thought process is coherent.  Objectively, there was no indication that patient was responding to any internal/external stimuli or experiencing any delusional thought content during interaction with patient.  Patient continues to endorse suicidal ideation with plan to walk into a train.  He admits to history of multiple suicidal attempts via overdose and cutting.  Last suicidal attempt was 07/2022-patient says that he overdosed on Seroquel and was hospitalized at [facility] in [place], [place].  He denies homicidal ideation, paranoia, and recent substance use (UDS this admission was negative). Patient reports that he is experiencing visual hallucination of "seeing visions of things" and auditory hallucination of "voices telling me to hurt myself."  Patient says that he is unable to contract for safety if discharged tonight.   Total Time spent with patient: 20 minutes  Musculoskeletal  Strength & Muscle Tone: within normal limits Gait & Station:  normal Patient leans: Right  Psychiatric Specialty Exam  Presentation General Appearance:  Appropriate for Environment  Eye Contact: Good  Speech: Clear and Coherent  Speech Volume: Normal  Handedness: Right   Mood and Affect  Mood: Depressed  Affect: Congruent   Thought Process  Thought Processes: Coherent  Descriptions of Associations:Intact  Orientation:Full (Time, Place and Person)  Thought Content:WDL  Diagnosis of Schizophrenia or Schizoaffective disorder in past: Yes (Pt reports that he has been told that he has diagnosis of schizoffective d/o)  Duration of Psychotic Symptoms: Greater than six months  Hallucinations:Hallucinations: Visual; Auditory Description of Auditory Hallucinations: "voices telling me to hurt myself" Description of Visual Hallucinations: "Visons of things"  Ideas of Reference:None  Suicidal Thoughts:Suicidal Thoughts: Yes, Active SI Active Intent and/or Plan: With Plan  Homicidal Thoughts:Homicidal Thoughts: No   Sensorium  Memory: Immediate Good; Recent Good; Remote Good  Judgment: Fair  Insight: Good   Executive Functions  Concentration: Good  Attention Span: Good  Recall: Good  Fund of Knowledge: Good  Language: Good   Psychomotor Activity  Psychomotor Activity: Psychomotor Activity: Normal   Assets  Assets: Desire for Improvement; Communication Skills; Physical Health   Sleep  Sleep: Sleep: Poor Number of Hours of Sleep: 4   Nutritional Assessment (For OBS and FBC admissions only) Has the patient had a weight loss or gain of 10 pounds or more in the last 3 months?: No Has the patient had a decrease in food intake/or appetite?: No Does the patient have dental problems?: No Does the patient have eating habits or behaviors that may be indicators of an eating disorder including binging or inducing vomiting?: No Has the patient recently lost weight without trying?: 0  Has the patient been  eating poorly because of a decreased appetite?: 0 Malnutrition Screening Tool Score: 0    Physical Exam Vitals and nursing note reviewed.  Constitutional:      General: He is not in acute distress.    Appearance: He is well-developed.  HENT:     Head: Normocephalic and atraumatic.  Eyes:     Conjunctiva/sclera: Conjunctivae normal.  Cardiovascular:     Rate and Rhythm: Normal rate.     Heart sounds: No murmur heard. Pulmonary:     Effort: Pulmonary effort is normal. No respiratory distress.  Abdominal:     Palpations: Abdomen is soft.     Tenderness: There is no abdominal tenderness.  Musculoskeletal:        General: Normal range of motion.     Cervical back: Normal range of motion.  Skin:    General: Skin is warm and dry.     Capillary Refill: Capillary refill takes less than 2 seconds.  Neurological:     Mental Status: He is alert and oriented to person, place, and time.  Psychiatric:        Attention and Perception: Attention normal. He perceives auditory and visual hallucinations.        Mood and Affect: Mood is depressed.        Speech: Speech normal.        Behavior: Behavior normal. Behavior is cooperative.        Thought Content: Thought content is not paranoid or delusional. Thought content includes suicidal ideation. Thought content does not include homicidal ideation. Thought content includes suicidal plan. Thought content does not include homicidal plan.    Review of Systems  Constitutional: Negative.   HENT: Negative.    Eyes: Negative.   Respiratory: Negative.    Cardiovascular: Negative.   Gastrointestinal: Negative.   Genitourinary: Negative.   Musculoskeletal: Negative.   Skin: Negative.   Neurological: Negative.   Endo/Heme/Allergies: Negative.   Psychiatric/Behavioral:  Positive for suicidal ideas. The patient is nervous/anxious.     Blood pressure 138/86, pulse 90, temperature (!) 97.5 F (36.4 C), temperature source Oral, resp. rate 20, SpO2  98 %. There is no height or weight on file to calculate BMI.  Past Psychiatric History:  Past Medical History:  Diagnosis Date   Depression    Psychosis    PTSD (post-traumatic stress disorder)       Is the patient at risk to self? Yes  Has the patient been a risk to self in the past 6 months? Yes .    Has the patient been a risk to self within the distant past? Yes   Is the patient a risk to others? No   Has the patient been a risk to others in the past 6 months? No   Has the patient been a risk to others within the distant past? No   Past Medical History: Fibromyalgia, poly substance abuse, Vit. D deficiency  Past Medical History: No date: Depression No date: Psychosis No date: PTSD (post-traumatic stress disorder)   Family History:    Social History:  Social History   Tobacco Use   Smoking status: Every Day    Packs/day: 1.00    Years: 11.00    Additional pack years: 0.00    Total pack years: 11.00    Types: Cigarettes   Smokeless tobacco: Never  Vaping Use   Vaping Use: Never used  Substance Use Topics   Alcohol use: Not Currently  Comment: reports periodic alcohol use   Drug use: Yes    Frequency: 1.0 times per week    Types: Marijuana    Comment: 1/3 gm daily     Last Labs:  Admission on 09/16/2022, Discharged on 09/17/2022  Component Date Value Ref Range Status   Opiates 09/16/2022 NONE DETECTED  NONE DETECTED Final   Cocaine 09/16/2022 NONE DETECTED  NONE DETECTED Final   Benzodiazepines 09/16/2022 NONE DETECTED  NONE DETECTED Final   Amphetamines 09/16/2022 NONE DETECTED  NONE DETECTED Final   Tetrahydrocannabinol 09/16/2022 NONE DETECTED  NONE DETECTED Final   Barbiturates 09/16/2022 NONE DETECTED  NONE DETECTED Final   Comment: (NOTE) DRUG SCREEN FOR MEDICAL PURPOSES ONLY.  IF CONFIRMATION IS NEEDED FOR ANY PURPOSE, NOTIFY LAB WITHIN 5 DAYS.  LOWEST DETECTABLE LIMITS FOR URINE DRUG SCREEN Drug Class                     Cutoff  (ng/mL) Amphetamine and metabolites    1000 Barbiturate and metabolites    200 Benzodiazepine                 200 Opiates and metabolites        300 Cocaine and metabolites        300 THC                            50 Performed at [facility], [address] [place], [place] [address]    Sodium 09/16/2022 137  135 - 145 mmol/L Final   Potassium 09/16/2022 3.9  3.5 - 5.1 mmol/L Final   Chloride 09/16/2022 104  98 - 111 mmol/L Final   CO2 09/16/2022 26  22 - 32 mmol/L Final   Glucose, Bld 09/16/2022 80  70 - 99 mg/dL Final   Glucose reference range applies only to samples taken after fasting for at least 8 hours.   BUN 09/16/2022 18  6 - 20 mg/dL Final   Creatinine, Ser 09/16/2022 0.87  0.61 - 1.24 mg/dL Final   Calcium 13/01/6577 8.6 (L)  8.9 - 10.3 mg/dL Final   Total Protein 46/96/2952 6.8  6.5 - 8.1 g/dL Final   Albumin 84/13/2440 4.2  3.5 - 5.0 g/dL Final   AST 04/09/2535 34  15 - 41 U/L Final   ALT 09/16/2022 36  0 - 44 U/L Final   Alkaline Phosphatase 09/16/2022 42  38 - 126 U/L Final   Total Bilirubin 09/16/2022 0.5  0.3 - 1.2 mg/dL Final   GFR, Estimated 09/16/2022 >60  >60 mL/min Final   Comment: (NOTE) Calculated using the CKD-EPI Creatinine Equation (2021)    Anion gap 09/16/2022 7  5 - 15 Final   Performed at [facility], [address] [place], [place] [address]   Alcohol, Ethyl (B) 09/16/2022 <10  <10 mg/dL Final   Comment: (NOTE) Lowest detectable limit for serum alcohol is 10 mg/dL.  For medical purposes only. Performed at [facility], [address], [place], [place] [address]    WBC 09/16/2022 7.4  4.0 - 10.5 K/uL Final   RBC 09/16/2022 4.89  4.22 - 5.81 MIL/uL Final   Hemoglobin 09/16/2022 14.5  13.0 - 17.0 g/dL Final   HCT 95/63/8756 42.6  39.0 - 52.0 % Final   MCV 09/16/2022 87.1  80.0 - 100.0 fL Final   MCH 09/16/2022 29.7  26.0 - 34.0 pg Final   MCHC  09/16/2022 34.0  30.0 - 36.0 g/dL Final    RDW 00/76/2263 13.2  11.5 - 15.5 % Final   Platelets 09/16/2022 229  150 - 400 K/uL Final   nRBC 09/16/2022 0.0  0.0 - 0.2 % Final   Neutrophils Relative % 09/16/2022 59  % Final   Neutro Abs 09/16/2022 4.3  1.7 - 7.7 K/uL Final   Lymphocytes Relative 09/16/2022 33  % Final   Lymphs Abs 09/16/2022 2.5  0.7 - 4.0 K/uL Final   Monocytes Relative 09/16/2022 7  % Final   Monocytes Absolute 09/16/2022 0.5  0.1 - 1.0 K/uL Final   Eosinophils Relative 09/16/2022 1  % Final   Eosinophils Absolute 09/16/2022 0.1  0.0 - 0.5 K/uL Final   Basophils Relative 09/16/2022 0  % Final   Basophils Absolute 09/16/2022 0.0  0.0 - 0.1 K/uL Final   Immature Granulocytes 09/16/2022 0  % Final   Abs Immature Granulocytes 09/16/2022 0.03  0.00 - 0.07 K/uL Final   Performed at [facility], [address], [place], [place] [address]   Total CK 09/16/2022 498 (H)  49 - 397 U/L Final   Performed at [facility], [address] [place], [place] [address]   Acetaminophen (Tylenol), Serum 09/16/2022 <10 (L)  10 - 30 ug/mL Final   Comment: (NOTE) Therapeutic concentrations vary significantly. A range of 10-30 ug/mL  may be an effective concentration for many patients. However, some  are best treated at concentrations outside of this range. Acetaminophen concentrations >150 ug/mL at 4 hours after ingestion  and >50 ug/mL at 12 hours after ingestion are often associated with  toxic reactions.  Performed at [facility], [address] [place], [place] [address]    Salicylate Lvl 09/16/2022 <7.0 (L)  7.0 - 30.0 mg/dL Final   Performed at [facility], [address] [place], [place] [address]   Valproic Acid Lvl 09/16/2022 <10 (L)  50.0 - 100.0 ug/mL Final   Comment: RESULT CONFIRMED BY MANUAL DILUTION Performed at [facility], [address], [place], [place] [address]    SARS Coronavirus 2 by RT PCR 09/16/2022  NEGATIVE  NEGATIVE Final   Comment: (NOTE) SARS-CoV-2 target nucleic acids are NOT DETECTED.  The SARS-CoV-2 RNA is generally detectable in upper and lower respiratory specimens during the acute phase of infection. The lowest concentration of SARS-CoV-2 viral copies this assay can detect is 250 copies / mL. A negative result does not preclude SARS-CoV-2 infection and should not be used as the sole basis for treatment or other patient management decisions.  A negative result may occur with improper specimen collection / handling, submission of specimen other than nasopharyngeal swab, presence of viral mutation(s) within the areas targeted by this assay, and inadequate number of viral copies (<250 copies / mL). A negative result must be combined with clinical observations, patient history, and epidemiological information.  Fact Sheet for Patients:   RoadLapTop.co.za  Fact Sheet for Healthcare Providers: http://kim-Tesar.com/  This test is not yet approved or                           cleared by the [place] FDA and has been authorized for detection and/or diagnosis of SARS-CoV-2 by FDA under an Emergency Use Authorization (EUA).  This EUA will remain in effect (meaning this test can be used) for the duration of the COVID-19 declaration under Section 564(b)(1) of  the Act, 21 U.S.C. section 360bbb-3(b)(1), unless the authorization is terminated or revoked sooner.  Performed at [facility], [address] [place], [place] [address]    Lithium Lvl 09/17/2022 <0.06 (L)  0.60 - 1.20 mmol/L Final   Performed at [facility], [address], [place], [place] [address]  Admission on 08/18/2022, Discharged on 08/20/2022  Component Date Value Ref Range Status   Sodium 08/18/2022 139  135 - 145 mmol/L Final   Potassium 08/18/2022 3.9  3.5 - 5.1 mmol/L Final   Chloride 08/18/2022 106  98 - 111 mmol/L  Final   CO2 08/18/2022 24  22 - 32 mmol/L Final   Glucose, Bld 08/18/2022 98  70 - 99 mg/dL Final   Glucose reference range applies only to samples taken after fasting for at least 8 hours.   BUN 08/18/2022 13  6 - 20 mg/dL Final   Creatinine, Ser 08/18/2022 0.91  0.61 - 1.24 mg/dL Final   Calcium 09/81/1914 8.9  8.9 - 10.3 mg/dL Final   Total Protein 78/29/5621 7.0  6.5 - 8.1 g/dL Final   Albumin 30/86/5784 4.2  3.5 - 5.0 g/dL Final   AST 69/62/9528 19  15 - 41 U/L Final   ALT 08/18/2022 15  0 - 44 U/L Final   Alkaline Phosphatase 08/18/2022 44  38 - 126 U/L Final   Total Bilirubin 08/18/2022 0.6  0.3 - 1.2 mg/dL Final   GFR, Estimated 08/18/2022 >60  >60 mL/min Final   Comment: (NOTE) Calculated using the CKD-EPI Creatinine Equation (2021)    Anion gap 08/18/2022 9  5 - 15 Final   Performed at [facility], [address] [place], [place] [address]   Alcohol, Ethyl (B) 08/18/2022 <10  <10 mg/dL Final   Comment: (NOTE) Lowest detectable limit for serum alcohol is 10 mg/dL.  For medical purposes only. Performed at [facility], [address] [place], [place] [address]    Salicylate Lvl 08/18/2022 <7.0 (L)  7.0 - 30.0 mg/dL Final   Performed at [facility], [address] [place], [place] [address]   Acetaminophen (Tylenol), Serum 08/18/2022 <10 (L)  10 - 30 ug/mL Final   Comment: (NOTE) Therapeutic concentrations vary significantly. A range of 10-30 ug/mL  may be an effective concentration for many patients. However, some  are best treated at concentrations outside of this range. Acetaminophen concentrations >150 ug/mL at 4 hours after ingestion  and >50 ug/mL at 12 hours after ingestion are often associated with  toxic reactions.  Performed at [facility], [address] [place], [place] [address]    WBC 08/18/2022 5.7  4.0 - 10.5 K/uL Final   RBC 08/18/2022 4.87  4.22 - 5.81 MIL/uL Final   Hemoglobin 08/18/2022 14.4  13.0  - 17.0 g/dL Final   HCT 47/42/5956 43.7  39.0 - 52.0 % Final   MCV 08/18/2022 89.7  80.0 - 100.0 fL Final   MCH 08/18/2022 29.6  26.0 - 34.0 pg Final   MCHC 08/18/2022 33.0  30.0 - 36.0 g/dL Final   RDW 38/75/6433 13.2  11.5 - 15.5 % Final   Platelets 08/18/2022 190  150 - 400 K/uL Final   nRBC 08/18/2022 0.0  0.0 - 0.2 % Final   Performed at [facility], [address] [place], [place] [address]   Glucose-Capillary 08/19/2022 76  70 - 99 mg/dL Final   Glucose  reference range applies only to samples taken after fasting for at least 8 hours.   Acetaminophen (Tylenol), Serum 08/18/2022 <10 (L)  10 - 30 ug/mL Final   Comment: (NOTE) Therapeutic concentrations vary significantly. A range of 10-30 ug/mL  may be an effective concentration for many patients. However, some  are best treated at concentrations outside of this range. Acetaminophen concentrations >150 ug/mL at 4 hours after ingestion  and >50 ug/mL at 12 hours after ingestion are often associated with  toxic reactions.  Performed at [facility], [address] [place], [place] [address]    Sodium 08/18/2022 138  135 - 145 mmol/L Final   Potassium 08/18/2022 4.2  3.5 - 5.1 mmol/L Final   Chloride 08/18/2022 106  98 - 111 mmol/L Final   CO2 08/18/2022 26  22 - 32 mmol/L Final   Glucose, Bld 08/18/2022 85  70 - 99 mg/dL Final   Glucose reference range applies only to samples taken after fasting for at least 8 hours.   BUN 08/18/2022 14  6 - 20 mg/dL Final   Creatinine, Ser 08/18/2022 0.90  0.61 - 1.24 mg/dL Final   Calcium 62/13/0865 8.7 (L)  8.9 - 10.3 mg/dL Final   Total Protein 78/46/9629 6.6  6.5 - 8.1 g/dL Final   Albumin 52/84/1324 3.8  3.5 - 5.0 g/dL Final   AST 40/03/2724 16  15 - 41 U/L Final   ALT 08/18/2022 16  0 - 44 U/L Final   Alkaline Phosphatase 08/18/2022 42  38 - 126 U/L Final   Total Bilirubin 08/18/2022 0.7  0.3 - 1.2 mg/dL Final   GFR, Estimated 08/18/2022 >60  >60 mL/min Final    Comment: (NOTE) Calculated using the CKD-EPI Creatinine Equation (2021)    Anion gap 08/18/2022 6  5 - 15 Final   Performed at [facility], [address] [place], [place] [address]   Glucose-Capillary 08/19/2022 82  70 - 99 mg/dL Final   Glucose reference range applies only to samples taken after fasting for at least 8 hours.   SARS Coronavirus 2 by RT PCR 08/19/2022 NEGATIVE  NEGATIVE Final   Comment: (NOTE) SARS-CoV-2 target nucleic acids are NOT DETECTED.  The SARS-CoV-2 RNA is generally detectable in upper respiratory specimens during the acute phase of infection. The lowest concentration of SARS-CoV-2 viral copies this assay can detect is 138 copies/mL. A negative result does not preclude SARS-Cov-2 infection and should not be used as the sole basis for treatment or other patient management decisions. A negative result may occur with  improper specimen collection/handling, submission of specimen other than nasopharyngeal swab, presence of viral mutation(s) within the areas targeted by this assay, and inadequate number of viral copies(<138 copies/mL). A negative result must be combined with clinical observations, patient history, and epidemiological information. The expected result is Negative.  Fact Sheet for Patients:  BloggerCourse.com  Fact Sheet for Healthcare Providers:  SeriousBroker.it  This test is no                          t yet approved or cleared by the [place] FDA and  has been authorized for detection and/or diagnosis of SARS-CoV-2 by FDA under an Emergency Use Authorization (EUA). This EUA will remain  in effect (meaning this test can be used) for the duration of the COVID-19 declaration under Section 564(b)(1) of the Act, 21 U.S.C.section 360bbb-3(b)(1), unless the authorization is terminated  or revoked sooner.       Influenza A by PCR 08/19/2022 NEGATIVE  NEGATIVE Final   Influenza B  by PCR 08/19/2022 NEGATIVE  NEGATIVE Final   Comment: (NOTE) The Xpert Xpress SARS-CoV-2/FLU/RSV plus assay is intended as an aid in the diagnosis of influenza from Nasopharyngeal swab specimens and should not be used as a sole basis for treatment. Nasal washings and aspirates are unacceptable for Xpert Xpress SARS-CoV-2/FLU/RSV testing.  Fact Sheet for Patients: BloggerCourse.com  Fact Sheet for Healthcare Providers: SeriousBroker.it  This test is not yet approved or cleared by the [place] FDA and has been authorized for detection and/or diagnosis of SARS-CoV-2 by FDA under an Emergency Use Authorization (EUA). This EUA will remain in effect (meaning this test can be used) for the duration of the COVID-19 declaration under Section 564(b)(1) of the Act, 21 U.S.C. section 360bbb-3(b)(1), unless the authorization is terminated or revoked.     Resp Syncytial Virus by PCR 08/19/2022 NEGATIVE  NEGATIVE Final   Comment: (NOTE) Fact Sheet for Patients: BloggerCourse.com  Fact Sheet for Healthcare Providers: SeriousBroker.it  This test is not yet approved or cleared by the [place] FDA and has been authorized for detection and/or diagnosis of SARS-CoV-2 by FDA under an Emergency Use Authorization (EUA). This EUA will remain in effect (meaning this test can be used) for the duration of the COVID-19 declaration under Section 564(b)(1) of the Act, 21 U.S.C. section 360bbb-3(b)(1), unless the authorization is terminated or revoked.  Performed at [facility], [address] [place], [place] [address]    Color, Urine 08/19/2022 YELLOW  YELLOW Final   APPearance 08/19/2022 CLEAR  CLEAR Final   Specific Gravity, Urine 08/19/2022 1.025  1.005 - 1.030 Final   pH 08/19/2022 6.0  5.0 - 8.0 Final   Glucose, UA 08/19/2022 NEGATIVE  NEGATIVE mg/dL Final   Hgb urine  dipstick 08/19/2022 NEGATIVE  NEGATIVE Final   Bilirubin Urine 08/19/2022 NEGATIVE  NEGATIVE Final   Ketones, ur 08/19/2022 NEGATIVE  NEGATIVE mg/dL Final   Protein, ur 81/19/1478 NEGATIVE  NEGATIVE mg/dL Final   Nitrite 29/56/2130 NEGATIVE  NEGATIVE Final   Leukocytes,Ua 08/19/2022 NEGATIVE  NEGATIVE Final   Comment: Microscopic not done on urines with negative protein, blood, leukocytes, nitrite, or glucose < 500 mg/dL. Performed at [facility], [address] [place], [place] [address]   Admission on 08/18/2022, Discharged on 08/18/2022  Component Date Value Ref Range Status   Sodium 08/18/2022 137  135 - 145 mmol/L Final   Potassium 08/18/2022 3.7  3.5 - 5.1 mmol/L Final   Chloride 08/18/2022 107  98 - 111 mmol/L Final   CO2 08/18/2022 22  22 - 32 mmol/L Final   Glucose, Bld 08/18/2022 122 (H)  70 - 99 mg/dL Final   Glucose reference range applies only to samples taken after fasting for at least 8 hours.   BUN 08/18/2022 13  6 - 20 mg/dL Final   Creatinine, Ser 08/18/2022 0.94  0.61 - 1.24 mg/dL Final   Calcium 46/96/2952 8.3 (L)  8.9 - 10.3 mg/dL Final   Total Protein 84/13/2440 6.6  6.5 - 8.1 g/dL Final   Albumin 04/09/2535 3.8  3.5 - 5.0 g/dL Final   AST 64/40/3474 28  15 - 41 U/L Final   ALT 08/18/2022 12  0 - 44 U/L Final   Alkaline Phosphatase 08/18/2022 43  38 - 126 U/L Final   Total Bilirubin 08/18/2022 0.4  0.3 - 1.2  mg/dL Final   GFR, Estimated 08/18/2022 >60  >60 mL/min Final   Comment: (NOTE) Calculated using the CKD-EPI Creatinine Equation (2021)    Anion gap 08/18/2022 8  5 - 15 Final   Performed at [facility], [address] [place], [place] [address]   Alcohol, Ethyl (B) 08/18/2022 <10  <10 mg/dL Final   Comment: (NOTE) Lowest detectable limit for serum alcohol is 10 mg/dL.  For medical purposes only. Performed at [facility], [address] [place], [place] [address]    Salicylate Lvl 08/18/2022 <7.0 (L)  7.0 - 30.0  mg/dL Final   Performed at [facility], [address] [place], [place] [address]   Acetaminophen (Tylenol), Serum 08/18/2022 <10 (L)  10 - 30 ug/mL Final   Comment: (NOTE) Therapeutic concentrations vary significantly. A range of 10-30 ug/mL  may be an effective concentration for many patients. However, some  are best treated at concentrations outside of this range. Acetaminophen concentrations >150 ug/mL at 4 hours after ingestion  and >50 ug/mL at 12 hours after ingestion are often associated with  toxic reactions.  Performed at [facility], [address] [place], [place] [address]    WBC 08/18/2022 5.8  4.0 - 10.5 K/uL Final   RBC 08/18/2022 4.71  4.22 - 5.81 MIL/uL Final   Hemoglobin 08/18/2022 13.9  13.0 - 17.0 g/dL Final   HCT 29/56/2130 42.6  39.0 - 52.0 % Final   MCV 08/18/2022 90.4  80.0 - 100.0 fL Final   MCH 08/18/2022 29.5  26.0 - 34.0 pg Final   MCHC 08/18/2022 32.6  30.0 - 36.0 g/dL Final   RDW 86/57/8469 13.1  11.5 - 15.5 % Final   Platelets 08/18/2022 183  150 - 400 K/uL Final   nRBC 08/18/2022 0.0  0.0 - 0.2 % Final   Performed at [facility], [address] [place], [place] [address]   Opiates 08/18/2022 NONE DETECTED  NONE DETECTED Final   Cocaine 08/18/2022 NONE DETECTED  NONE DETECTED Final   Benzodiazepines 08/18/2022 NONE DETECTED  NONE DETECTED Final   Amphetamines 08/18/2022 NONE DETECTED  NONE DETECTED Final   Tetrahydrocannabinol 08/18/2022 NONE DETECTED  NONE DETECTED Final   Barbiturates 08/18/2022 NONE DETECTED  NONE DETECTED Final   Comment: (NOTE) DRUG SCREEN FOR MEDICAL PURPOSES ONLY.  IF CONFIRMATION IS NEEDED FOR ANY PURPOSE, NOTIFY LAB WITHIN 5 DAYS.  LOWEST DETECTABLE LIMITS FOR URINE DRUG SCREEN Drug Class                     Cutoff (ng/mL) Amphetamine and metabolites    1000 Barbiturate and metabolites    200 Benzodiazepine                 200 Opiates and metabolites        300 Cocaine and  metabolites        300 THC                            50 Performed at [facility], [address] [place], [place] [address]   Office Visit on 08/15/2022  Component Date Value Ref Range Status   Valproic Acid Lvl 08/15/2022 55  50 - 100 ug/mL Final   Comment:  Detection Limit = 4                            <4 indicates None Detected Toxicity may occur at levels of 100-500. Measurements of free unbound valproic acid may improve the assess- ment of clinical response.    Glucose 08/15/2022 107 (H)  70 - 99 mg/dL Final   BUN 16/03/9603 10  6 - 20 mg/dL Final   Creatinine, Ser 08/15/2022 0.89  0.76 - 1.27 mg/dL Final   eGFR 54/02/8118 123  >59 mL/min/1.73 Final   BUN/Creatinine Ratio 08/15/2022 11  9 - 20 Final   Sodium 08/15/2022 142  134 - 144 mmol/L Final   Potassium 08/15/2022 4.3  3.5 - 5.2 mmol/L Final   Chloride 08/15/2022 102  96 - 106 mmol/L Final   CO2 08/15/2022 22  20 - 29 mmol/L Final   Calcium 08/15/2022 9.6  8.7 - 10.2 mg/dL Final   Vit D, 14-NWGNFAO 08/15/2022 13.7 (L)  30.0 - 100.0 ng/mL Final   Comment: Vitamin D deficiency has been defined by the Institute of Medicine and an Endocrine Society practice guideline as a level of serum 25-OH vitamin D less than 20 ng/mL (1,2). The Endocrine Society went on to further define vitamin D insufficiency as a level between 21 and 29 ng/mL (2). 1. IOM (Institute of Medicine). 2010. Dietary reference    intakes for calcium and D. [place]: The    [employer]. 2. Holick MF, Binkley [place], Bischoff-Ferrari HA, et al.    Evaluation, treatment, and prevention of vitamin D    deficiency: an Endocrine Society clinical practice    guideline. JCEM. 2011 Jul; 96(7):1911-30.    TSH 08/15/2022 2.100  0.450 - 4.500 uIU/mL Final   T4, Total 08/15/2022 5.2  4.5 - 12.0 ug/dL Final   T3 Uptake Ratio 08/15/2022 28  24 - 39 % Final   Free Thyroxine Index 08/15/2022 1.5  1.2 - 4.9 Final    HCV Ab 08/15/2022 Non Reactive  Non Reactive Final   HIV Screen 4th Generation wRfx 08/15/2022 Non Reactive  Non Reactive Final   Comment: HIV Negative HIV-1/HIV-2 antibodies and HIV-1 p24 antigen were NOT detected. There is no laboratory evidence of HIV infection.    RPR Ser Ql 08/15/2022 Non Reactive  Non Reactive Final   Neisseria Gonorrhea 08/15/2022 Negative   Final   Chlamydia 08/15/2022 Negative   Final   Trichomonas 08/15/2022 Negative   Final   Comment 08/15/2022 Normal Reference Ranger Chlamydia - Negative   Final   Comment 08/15/2022 Normal Reference Range Neisseria Gonorrhea - Negative   Final   Comment 08/15/2022 Normal Reference Range Trichomonas - Negative   Final   HCV Interp 1: 08/15/2022 Comment   Final   Comment: Not infected with HCV unless early or acute infection is suspected (which may be delayed in an immunocompromised individual), or other evidence exists to indicate HCV infection.   Admission on 07/21/2022, Discharged on 07/22/2022  Component Date Value Ref Range Status   Sodium 07/21/2022 140  135 - 145 mmol/L Final   Potassium 07/21/2022 4.3  3.5 - 5.1 mmol/L Final   Chloride 07/21/2022 105  98 - 111 mmol/L Final   CO2 07/21/2022 25  22 - 32 mmol/L Final   Glucose, Bld 07/21/2022 127 (H)  70 - 99 mg/dL Final   Glucose reference range applies only to samples taken after fasting for at least 8 hours.  BUN 07/21/2022 11  6 - 20 mg/dL Final   Creatinine, Ser 07/21/2022 0.94  0.61 - 1.24 mg/dL Final   Calcium 16/03/9603 8.7 (L)  8.9 - 10.3 mg/dL Final   Total Protein 54/02/8118 6.0 (L)  6.5 - 8.1 g/dL Final   Albumin 14/78/2956 3.8  3.5 - 5.0 g/dL Final   AST 21/30/8657 18  15 - 41 U/L Final   ALT 07/21/2022 12  0 - 44 U/L Final   Alkaline Phosphatase 07/21/2022 46  38 - 126 U/L Final   Total Bilirubin 07/21/2022 0.3  0.3 - 1.2 mg/dL Final   GFR, Estimated 07/21/2022 >60  >60 mL/min Final   Comment: (NOTE) Calculated using the CKD-EPI Creatinine  Equation (2021)    Anion gap 07/21/2022 10  5 - 15 Final   Performed at [facility], [address] [place], [place] [address]   Alcohol, Ethyl (B) 07/21/2022 <10  <10 mg/dL Final   Comment: (NOTE) Lowest detectable limit for serum alcohol is 10 mg/dL.  For medical purposes only. Performed at [facility], [address] [place], [place] [address]    Salicylate Lvl 07/21/2022 <7.0 (L)  7.0 - 30.0 mg/dL Final   Performed at [facility], [address], [place], [place] [address]   Acetaminophen (Tylenol), Serum 07/21/2022 <10 (L)  10 - 30 ug/mL Final   Comment: (NOTE) Therapeutic concentrations vary significantly. A range of 10-30 ug/mL  may be an effective concentration for many patients. However, some  are best treated at concentrations outside of this range. Acetaminophen concentrations >150 ug/mL at 4 hours after ingestion  and >50 ug/mL at 12 hours after ingestion are often associated with  toxic reactions.  Performed at [facility], [address], [place], [place] [address]    WBC 07/21/2022 6.4  4.0 - 10.5 K/uL Final   RBC 07/21/2022 4.73  4.22 - 5.81 MIL/uL Final   Hemoglobin 07/21/2022 13.8  13.0 - 17.0 g/dL Final   HCT 72/53/6644 43.7  39.0 - 52.0 % Final   MCV 07/21/2022 92.4  80.0 - 100.0 fL Final   MCH 07/21/2022 29.2  26.0 - 34.0 pg Final   MCHC 07/21/2022 31.6  30.0 - 36.0 g/dL Final   RDW 03/47/4259 13.4  11.5 - 15.5 % Final   Platelets 07/21/2022 240  150 - 400 K/uL Final   nRBC 07/21/2022 0.0  0.0 - 0.2 % Final   Performed at [facility], [address], [place], [place] [address]   Opiates 07/21/2022 NONE DETECTED  NONE DETECTED Final   Cocaine 07/21/2022 NONE DETECTED  NONE DETECTED Final   Benzodiazepines 07/21/2022 NONE DETECTED  NONE DETECTED Final   Amphetamines 07/21/2022 NONE DETECTED  NONE DETECTED Final   Tetrahydrocannabinol 07/21/2022 NONE DETECTED  NONE DETECTED Final   Barbiturates 07/21/2022 NONE DETECTED   NONE DETECTED Final   Comment: (NOTE) DRUG SCREEN FOR MEDICAL PURPOSES ONLY.  IF CONFIRMATION IS NEEDED FOR ANY PURPOSE, NOTIFY LAB WITHIN 5 DAYS.  LOWEST DETECTABLE LIMITS FOR URINE DRUG SCREEN Drug Class                     Cutoff (ng/mL) Amphetamine and metabolites    1000 Barbiturate and metabolites    200 Benzodiazepine                 200 Opiates and metabolites        300 Cocaine and metabolites  300 THC                            50 Performed at [facility], [address] [place], [place] [address]    Lithium Lvl 07/21/2022 <0.06 (L)  0.60 - 1.20 mmol/L Final   Performed at [facility], [address] [place], [place] [address]   Sodium 07/21/2022 141  135 - 145 mmol/L Final   Potassium 07/21/2022 4.3  3.5 - 5.1 mmol/L Final   Chloride 07/21/2022 107  98 - 111 mmol/L Final   CO2 07/21/2022 26  22 - 32 mmol/L Final   Glucose, Bld 07/21/2022 94  70 - 99 mg/dL Final   Glucose reference range applies only to samples taken after fasting for at least 8 hours.   BUN 07/21/2022 12  6 - 20 mg/dL Final   Creatinine, Ser 07/21/2022 0.99  0.61 - 1.24 mg/dL Final   Calcium 09/81/191402/01/2023 8.5 (L)  8.9 - 10.3 mg/dL Final   Total Protein 78/29/562102/01/2023 5.4 (L)  6.5 - 8.1 g/dL Final   Albumin 30/86/578402/01/2023 3.4 (L)  3.5 - 5.0 g/dL Final   AST 69/62/952802/01/2023 18  15 - 41 U/L Final   ALT 07/21/2022 11  0 - 44 U/L Final   Alkaline Phosphatase 07/21/2022 38  38 - 126 U/L Final   Total Bilirubin 07/21/2022 0.6  0.3 - 1.2 mg/dL Final   GFR, Estimated 07/21/2022 >60  >60 mL/min Final   Comment: (NOTE) Calculated using the CKD-EPI Creatinine Equation (2021)    Anion gap 07/21/2022 8  5 - 15 Final   Performed at [facility], [address] [place], [place] [address]   Acetaminophen (Tylenol), Serum 07/21/2022 <10 (L)  10 - 30 ug/mL Final   Comment: (NOTE) Therapeutic concentrations vary significantly. A range of 10-30 ug/mL  may be an effective concentration for many  patients. However, some  are best treated at concentrations outside of this range. Acetaminophen concentrations >150 ug/mL at 4 hours after ingestion  and >50 ug/mL at 12 hours after ingestion are often associated with  toxic reactions.  Performed at [facility], [address] [place], [place] [address]    Salicylate Lvl 07/21/2022 <7.0 (L)  7.0 - 30.0 mg/dL Final   Performed at [facility], [address] [place], [place] [address]   SARS Coronavirus 2 by RT PCR 07/22/2022 NEGATIVE  NEGATIVE Final   Influenza A by PCR 07/22/2022 NEGATIVE  NEGATIVE Final   Influenza B by PCR 07/22/2022 NEGATIVE  NEGATIVE Final   Comment: (NOTE) The Xpert Xpress SARS-CoV-2/FLU/RSV plus assay is intended as an aid in the diagnosis of influenza from Nasopharyngeal swab specimens and should not be used as a sole basis for treatment. Nasal washings and aspirates are unacceptable for Xpert Xpress SARS-CoV-2/FLU/RSV testing.  Fact Sheet for Patients: BloggerCourse.comhttps://www.fda.gov/media/152166/download  Fact Sheet for Healthcare Providers: SeriousBroker.ithttps://www.fda.gov/media/152162/download  This test is not yet approved or cleared by the [place] FDA and has been authorized for detection and/or diagnosis of SARS-CoV-2 by FDA under an Emergency Use Authorization (EUA). This EUA will remain in effect (meaning this test can be used) for the duration of the COVID-19 declaration under Section 564(b)(1) of the Act, 21 U.S.C. section 360bbb-3(b)(1), unless the authorization is terminated or revoked.     Resp Syncytial Virus by PCR 07/22/2022 NEGATIVE  NEGATIVE Final   Comment: (NOTE) Fact Sheet for Patients:  BloggerCourse.com  Fact Sheet for Healthcare Providers: SeriousBroker.it  This test is not yet approved or cleared by the [place] FDA and has been authorized for detection and/or diagnosis of SARS-CoV-2 by FDA under an Emergency Use  Authorization (EUA). This EUA will remain in effect (meaning this test can be used) for the duration of the COVID-19 declaration under Section 564(b)(1) of the Act, 21 U.S.C. section 360bbb-3(b)(1), unless the authorization is terminated or revoked.  Performed at [facility], [address], [place], [place] [address]   Admission on 07/20/2022, Discharged on 07/21/2022  Component Date Value Ref Range Status   Sodium 07/20/2022 135  135 - 145 mmol/L Final   Potassium 07/20/2022 3.7  3.5 - 5.1 mmol/L Final   Chloride 07/20/2022 102  98 - 111 mmol/L Final   CO2 07/20/2022 25  22 - 32 mmol/L Final   Glucose, Bld 07/20/2022 164 (H)  70 - 99 mg/dL Final   Glucose reference range applies only to samples taken after fasting for at least 8 hours.   BUN 07/20/2022 18  6 - 20 mg/dL Final   Creatinine, Ser 07/20/2022 1.06  0.61 - 1.24 mg/dL Final   Calcium 42/39/5320 8.4 (L)  8.9 - 10.3 mg/dL Final   Total Protein 23/34/3568 6.9  6.5 - 8.1 g/dL Final   Albumin 61/68/3729 4.2  3.5 - 5.0 g/dL Final   AST 07/24/1550 24  15 - 41 U/L Final   ALT 07/20/2022 12  0 - 44 U/L Final   Alkaline Phosphatase 07/20/2022 47  38 - 126 U/L Final   Total Bilirubin 07/20/2022 0.5  0.3 - 1.2 mg/dL Final   GFR, Estimated 07/20/2022 >60  >60 mL/min Final   Comment: (NOTE) Calculated using the CKD-EPI Creatinine Equation (2021)    Anion gap 07/20/2022 8  5 - 15 Final   Performed at [facility], [address] [place], [place] [address]   Alcohol, Ethyl (B) 07/20/2022 <10  <10 mg/dL Final   Comment: (NOTE) Lowest detectable limit for serum alcohol is 10 mg/dL.  For medical purposes only. Performed at [facility], [address] [place], [place] [address]    Salicylate Lvl 07/20/2022 <7.0 (L)  7.0 - 30.0 mg/dL Final   Performed at [facility], [address] [place], [place] [address]   Acetaminophen (Tylenol), Serum 07/20/2022 <10 (L)  10 - 30 ug/mL Final    Comment: (NOTE) Therapeutic concentrations vary significantly. A range of 10-30 ug/mL  may be an effective concentration for many patients. However, some  are best treated at concentrations outside of this range. Acetaminophen concentrations >150 ug/mL at 4 hours after ingestion  and >50 ug/mL at 12 hours after ingestion are often associated with  toxic reactions.  Performed at [facility], [address] [place], [place] [address]    WBC 07/20/2022 10.9 (H)  4.0 - 10.5 K/uL Final   RBC 07/20/2022 4.68  4.22 - 5.81 MIL/uL Final   Hemoglobin 07/20/2022 13.8  13.0 - 17.0 g/dL Final   HCT 04/14/1116 41.9  39.0 - 52.0 % Final   MCV 07/20/2022 89.5  80.0 - 100.0 fL Final   MCH 07/20/2022 29.5  26.0 - 34.0 pg Final   MCHC 07/20/2022 32.9  30.0 - 36.0 g/dL Final   RDW 35/67/0141 13.5  11.5 - 15.5 % Final   Platelets 07/20/2022 261  150 - 400 K/uL Final   nRBC 07/20/2022  0.0  0.0 - 0.2 % Final   Performed at [facility], [address] [place], [place] [address]   Opiates 07/20/2022 NONE DETECTED  NONE DETECTED Final   Cocaine 07/20/2022 NONE DETECTED  NONE DETECTED Final   Benzodiazepines 07/20/2022 NONE DETECTED  NONE DETECTED Final   Amphetamines 07/20/2022 NONE DETECTED  NONE DETECTED Final   Tetrahydrocannabinol 07/20/2022 NONE DETECTED  NONE DETECTED Final   Barbiturates 07/20/2022 NONE DETECTED  NONE DETECTED Final   Comment: (NOTE) DRUG SCREEN FOR MEDICAL PURPOSES ONLY.  IF CONFIRMATION IS NEEDED FOR ANY PURPOSE, NOTIFY LAB WITHIN 5 DAYS.  LOWEST DETECTABLE LIMITS FOR URINE DRUG SCREEN Drug Class                     Cutoff (ng/mL) Amphetamine and metabolites    1000 Barbiturate and metabolites    200 Benzodiazepine                 200 Opiates and metabolites        300 Cocaine and metabolites        300 THC                            50 Performed at [facility], [address] [place], [place] [address]    Lithium Lvl 07/21/2022  <0.06 (L)  0.60 - 1.20 mmol/L Final   Performed at [facility], [address], [place], [place] [address]  Admission on 07/04/2022, Discharged on 07/07/2022  Component Date Value Ref Range Status   SARS Coronavirus 2 by RT PCR 07/04/2022 NEGATIVE  NEGATIVE Final   Comment: (NOTE) SARS-CoV-2 target nucleic acids are NOT DETECTED.  The SARS-CoV-2 RNA is generally detectable in upper respiratory specimens during the acute phase of infection. The lowest concentration of SARS-CoV-2 viral copies this assay can detect is 138 copies/mL. A negative result does not preclude SARS-Cov-2 infection and should not be used as the sole basis for treatment or other patient management decisions. A negative result may occur with  improper specimen collection/handling, submission of specimen other than nasopharyngeal swab, presence of viral mutation(s) within the areas targeted by this assay, and inadequate number of viral copies(<138 copies/mL). A negative result must be combined with clinical observations, patient history, and epidemiological information. The expected result is Negative.  Fact Sheet for Patients:  BloggerCourse.com  Fact Sheet for Healthcare Providers:  SeriousBroker.it  This test is no                          t yet approved or cleared by the [place] FDA and  has been authorized for detection and/or diagnosis of SARS-CoV-2 by FDA under an Emergency Use Authorization (EUA). This EUA will remain  in effect (meaning this test can be used) for the duration of the COVID-19 declaration under Section 564(b)(1) of the Act, 21 U.S.C.section 360bbb-3(b)(1), unless the authorization is terminated  or revoked sooner.       Influenza A by PCR 07/04/2022 NEGATIVE  NEGATIVE Final   Influenza B by PCR 07/04/2022 NEGATIVE  NEGATIVE Final   Comment: (NOTE) The Xpert Xpress SARS-CoV-2/FLU/RSV plus assay is intended as an aid in the  diagnosis of influenza from Nasopharyngeal swab specimens and should not be used as a sole basis for treatment. Nasal washings and aspirates are unacceptable for Xpert Xpress SARS-CoV-2/FLU/RSV testing.  Fact Sheet  for Patients: BloggerCourse.com  Fact Sheet for Healthcare Providers: SeriousBroker.it  This test is not yet approved or cleared by the [place] FDA and has been authorized for detection and/or diagnosis of SARS-CoV-2 by FDA under an Emergency Use Authorization (EUA). This EUA will remain in effect (meaning this test can be used) for the duration of the COVID-19 declaration under Section 564(b)(1) of the Act, 21 U.S.C. section 360bbb-3(b)(1), unless the authorization is terminated or revoked.     Resp Syncytial Virus by PCR 07/04/2022 NEGATIVE  NEGATIVE Final   Comment: (NOTE) Fact Sheet for Patients: BloggerCourse.com  Fact Sheet for Healthcare Providers: SeriousBroker.it  This test is not yet approved or cleared by the [place] FDA and has been authorized for detection and/or diagnosis of SARS-CoV-2 by FDA under an Emergency Use Authorization (EUA). This EUA will remain in effect (meaning this test can be used) for the duration of the COVID-19 declaration under Section 564(b)(1) of the Act, 21 U.S.C. section 360bbb-3(b)(1), unless the authorization is terminated or revoked.  Performed at [facility], [address], [place], [place] [address]    WBC 07/04/2022 10.2  4.0 - 10.5 K/uL Final   RBC 07/04/2022 4.66  4.22 - 5.81 MIL/uL Final   Hemoglobin 07/04/2022 13.8  13.0 - 17.0 g/dL Final   HCT 60/45/4098 41.0  39.0 - 52.0 % Final   MCV 07/04/2022 88.0  80.0 - 100.0 fL Final   MCH 07/04/2022 29.6  26.0 - 34.0 pg Final   MCHC 07/04/2022 33.7  30.0 - 36.0 g/dL Final   RDW 11/91/4782 14.0  11.5 - 15.5 % Final   Platelets 07/04/2022 259  150 - 400  K/uL Final   nRBC 07/04/2022 0.0  0.0 - 0.2 % Final   Neutrophils Relative % 07/04/2022 57  % Final   Neutro Abs 07/04/2022 5.9  1.7 - 7.7 K/uL Final   Lymphocytes Relative 07/04/2022 31  % Final   Lymphs Abs 07/04/2022 3.2  0.7 - 4.0 K/uL Final   Monocytes Relative 07/04/2022 8  % Final   Monocytes Absolute 07/04/2022 0.8  0.1 - 1.0 K/uL Final   Eosinophils Relative 07/04/2022 2  % Final   Eosinophils Absolute 07/04/2022 0.2  0.0 - 0.5 K/uL Final   Basophils Relative 07/04/2022 1  % Final   Basophils Absolute 07/04/2022 0.1  0.0 - 0.1 K/uL Final   Immature Granulocytes 07/04/2022 1  % Final   Abs Immature Granulocytes 07/04/2022 0.11 (H)  0.00 - 0.07 K/uL Final   Performed at [facility], [address] [place], [place] [address]   Sodium 07/04/2022 139  135 - 145 mmol/L Final   Potassium 07/04/2022 3.4 (L)  3.5 - 5.1 mmol/L Final   Chloride 07/04/2022 102  98 - 111 mmol/L Final   CO2 07/04/2022 30  22 - 32 mmol/L Final   Glucose, Bld 07/04/2022 71  70 - 99 mg/dL Final   Glucose reference range applies only to samples taken after fasting for at least 8 hours.   BUN 07/04/2022 12  6 - 20 mg/dL Final   Creatinine, Ser 07/04/2022 0.95  0.61 - 1.24 mg/dL Final   Calcium 30/86/5784 9.0  8.9 - 10.3 mg/dL Final   Total Protein 69/62/9528 6.7  6.5 - 8.1 g/dL Final   Albumin 41/32/4401 4.2  3.5 - 5.0 g/dL Final   AST 02/72/5366 21  15 - 41 U/L Final   ALT 07/04/2022 17  0 - 44 U/L Final  Alkaline Phosphatase 07/04/2022 44  38 - 126 U/L Final   Total Bilirubin 07/04/2022 0.3  0.3 - 1.2 mg/dL Final   GFR, Estimated 07/04/2022 >60  >60 mL/min Final   Comment: (NOTE) Calculated using the CKD-EPI Creatinine Equation (2021)    Anion gap 07/04/2022 7  5 - 15 Final   Performed at [facility], [address], [place], [place] [address]   Hgb A1c MFr Bld 07/04/2022 4.9  4.8 - 5.6 % Final   Comment: (NOTE) Pre diabetes:          5.7%-6.4%  Diabetes:              >6.4%  Glycemic  control for   <7.0% adults with diabetes    Mean Plasma Glucose 07/04/2022 93.93  mg/dL Final   Performed at [facility], [address] [place], [place] [address]   POC Amphetamine UR 07/04/2022 None Detected  NONE DETECTED (Cut Off Level 1000 ng/mL) Final   POC Secobarbital (BAR) 07/04/2022 None Detected  NONE DETECTED (Cut Off Level 300 ng/mL) Final   POC Buprenorphine (BUP) 07/04/2022 None Detected  NONE DETECTED (Cut Off Level 10 ng/mL) Final   POC Oxazepam (BZO) 07/04/2022 None Detected  NONE DETECTED (Cut Off Level 300 ng/mL) Final   POC Cocaine UR 07/04/2022 None Detected  NONE DETECTED (Cut Off Level 300 ng/mL) Final   POC Methamphetamine UR 07/04/2022 None Detected  NONE DETECTED (Cut Off Level 1000 ng/mL) Final   POC Morphine 07/04/2022 None Detected  NONE DETECTED (Cut Off Level 300 ng/mL) Final   POC Methadone UR 07/04/2022 None Detected  NONE DETECTED (Cut Off Level 300 ng/mL) Final   POC Oxycodone UR 07/04/2022 None Detected  NONE DETECTED (Cut Off Level 100 ng/mL) Final   POC Marijuana UR 07/04/2022 None Detected  NONE DETECTED (Cut Off Level 50 ng/mL) Final   Lithium Lvl 07/04/2022 0.49 (L)  0.60 - 1.20 mmol/L Final   Performed at [facility], [address], [place], [place] [address]   Valproic Acid Lvl 07/04/2022 52  50.0 - 100.0 ug/mL Final   Performed at [facility], [address] [place], [place] [address]   SARSCOV2ONAVIRUS 2 AG 07/04/2022 NEGATIVE  NEGATIVE Final   Comment: (NOTE) SARS-CoV-2 antigen NOT DETECTED.   Negative results are presumptive.  Negative results do not preclude SARS-CoV-2 infection and should not be used as the sole basis for treatment or other patient management decisions, including infection  control decisions, particularly in the presence of clinical signs and  symptoms consistent with COVID-19, or in those who have been in contact with the virus.  Negative results must be combined with clinical observations,  patient history, and epidemiological information. The expected result is Negative.  Fact Sheet for Patients: https://www.jennings-kim.com/  Fact Sheet for Healthcare Providers: https://alexander-rogers.biz/  This test is not yet approved or cleared by the [place] FDA and  has been authorized for detection and/or diagnosis of SARS-CoV-2 by FDA under an Emergency Use Authorization (EUA).  This EUA will remain in effect (meaning this test can be used) for the duration of  the COV                          ID-19 declaration under Section 564(b)(1) of the Act, 21 U.S.C. section 360bbb-3(b)(1), unless the authorization is terminated or revoked sooner.     Cholesterol 07/04/2022 150  0 - 200 mg/dL Final  Triglycerides 07/04/2022 106  <150 mg/dL Final   HDL 16/03/9603 50  >40 mg/dL Final   Total CHOL/HDL Ratio 07/04/2022 3.0  RATIO Final   VLDL 07/04/2022 21  0 - 40 mg/dL Final   LDL Cholesterol 07/04/2022 79  0 - 99 mg/dL Final   Comment:        Total Cholesterol/HDL:CHD Risk Coronary Heart Disease Risk Table                     Men   Women  1/2 Average Risk   3.4   3.3  Average Risk       5.0   4.4  2 X Average Risk   9.6   7.1  3 X Average Risk  23.4   11.0        Use the calculated Patient Ratio above and the CHD Risk Table to determine the patient's CHD Risk.        ATP III CLASSIFICATION (LDL):  <100     mg/dL   Optimal  540-981  mg/dL   Near or Above                    Optimal  130-159  mg/dL   Borderline  191-478  mg/dL   High  >295     mg/dL   Very High Performed at [facility], [address], [place], [place] [address]    TSH 07/04/2022 5.790 (H)  0.350 - 4.500 uIU/mL Final   Comment: Performed by a 3rd Generation assay with a functional sensitivity of <=0.01 uIU/mL. Performed at [facility], [address], [place], [place] [address]    T3, Free 07/04/2022 3.6  2.0 - 4.4 pg/mL Final   Comment: (NOTE) Performed At: [facility] [address] [place], [place] [id number] Jolene Schimke MD UX:[id number]    Free T4 07/04/2022 0.65  0.61 - 1.12 ng/dL Final   Comment: (NOTE) Biotin ingestion may interfere with free T4 tests. If the results are inconsistent with the TSH level, previous test results, or the clinical presentation, then consider biotin interference. If needed, order repeat testing after stopping biotin. Performed at [facility], [address], [place], [place] [address]   Admission on 06/03/2022, Discharged on 06/07/2022  Component Date Value Ref Range Status   Sodium 06/03/2022 137  135 - 145 mmol/L Final   Potassium 06/03/2022 3.5  3.5 - 5.1 mmol/L Final   Chloride 06/03/2022 107  98 - 111 mmol/L Final   CO2 06/03/2022 22  22 - 32 mmol/L Final   Glucose, Bld 06/03/2022 99  70 - 99 mg/dL Final   Glucose reference range applies only to samples taken after fasting for at least 8 hours.   BUN 06/03/2022 7  6 - 20 mg/dL Final   Creatinine, Ser 06/03/2022 0.93  0.61 - 1.24 mg/dL Final   Calcium 40/34/7425 9.1  8.9 - 10.3 mg/dL Final   Total Protein 95/63/8756 7.4  6.5 - 8.1 g/dL Final   Albumin 43/32/9518 4.4  3.5 - 5.0 g/dL Final   AST 84/16/6063 19  15 - 41 U/L Final   ALT 06/03/2022 13  0 - 44 U/L Final   Alkaline Phosphatase 06/03/2022 57  38 - 126 U/L Final   Total Bilirubin 06/03/2022 0.9  0.3 - 1.2 mg/dL Final   GFR, Estimated 06/03/2022 >60  >60 mL/min Final   Comment: (NOTE) Calculated using the CKD-EPI Creatinine Equation (2021)  Anion gap 06/03/2022 8  5 - 15 Final   Performed at [facility], [address] [place], [place] [address]   Alcohol, Ethyl (B) 06/03/2022 <10  <10 mg/dL Final   Comment: (NOTE) Lowest detectable limit for serum alcohol is 10 mg/dL.  For medical purposes only. Performed at [facility], [address] [place], [place] [address]    Salicylate Lvl 06/03/2022 <7.0 (L)  7.0 - 30.0 mg/dL Final   Performed at  [facility], [address] [place], [place] [address]   Acetaminophen (Tylenol), Serum 06/03/2022 <10 (L)  10 - 30 ug/mL Final   Comment: (NOTE) Therapeutic concentrations vary significantly. A range of 10-30 ug/mL  may be an effective concentration for many patients. However, some  are best treated at concentrations outside of this range. Acetaminophen concentrations >150 ug/mL at 4 hours after ingestion  and >50 ug/mL at 12 hours after ingestion are often associated with  toxic reactions.  Performed at [facility], [address] [place], [place] [address]    WBC 06/03/2022 8.5  4.0 - 10.5 K/uL Final   RBC 06/03/2022 5.11  4.22 - 5.81 MIL/uL Final   Hemoglobin 06/03/2022 14.8  13.0 - 17.0 g/dL Final   HCT 56/21/3086 43.8  39.0 - 52.0 % Final   MCV 06/03/2022 85.7  80.0 - 100.0 fL Final   MCH 06/03/2022 29.0  26.0 - 34.0 pg Final   MCHC 06/03/2022 33.8  30.0 - 36.0 g/dL Final   RDW 57/84/6962 14.0  11.5 - 15.5 % Final   Platelets 06/03/2022 249  150 - 400 K/uL Final   nRBC 06/03/2022 0.0  0.0 - 0.2 % Final   Performed at [facility], [address] [place], [place] [address]   Opiates 06/03/2022 NONE DETECTED  NONE DETECTED Final   Cocaine 06/03/2022 NONE DETECTED  NONE DETECTED Final   Benzodiazepines 06/03/2022 NONE DETECTED  NONE DETECTED Final   Amphetamines 06/03/2022 POSITIVE (A)  NONE DETECTED Final   Tetrahydrocannabinol 06/03/2022 POSITIVE (A)  NONE DETECTED Final   Barbiturates 06/03/2022 NONE DETECTED  NONE DETECTED Final   Comment: (NOTE) DRUG SCREEN FOR MEDICAL PURPOSES ONLY.  IF CONFIRMATION IS NEEDED FOR ANY PURPOSE, NOTIFY LAB WITHIN 5 DAYS.  LOWEST DETECTABLE LIMITS FOR URINE DRUG SCREEN Drug Class                     Cutoff (ng/mL) Amphetamine and metabolites    1000 Barbiturate and metabolites    200 Benzodiazepine                 200 Opiates and metabolites        300 Cocaine and metabolites        300 THC                             50 Performed at [facility], [address] [place], [place] [address]    SARS Coronavirus 2 by RT PCR 06/03/2022 NEGATIVE  NEGATIVE Final   Comment: (NOTE) SARS-CoV-2 target nucleic acids are NOT DETECTED.  The SARS-CoV-2 RNA is generally detectable in upper respiratory specimens during the acute phase of infection. The lowest concentration of SARS-CoV-2 viral copies this assay can detect is 138 copies/mL. A negative result does not preclude SARS-Cov-2 infection and should not be used as the sole basis  for treatment or other patient management decisions. A negative result may occur with  improper specimen collection/handling, submission of specimen other than nasopharyngeal swab, presence of viral mutation(s) within the areas targeted by this assay, and inadequate number of viral copies(<138 copies/mL). A negative result must be combined with clinical observations, patient history, and epidemiological information. The expected result is Negative.  Fact Sheet for Patients:  BloggerCourse.com  Fact Sheet for Healthcare Providers:  SeriousBroker.it  This test is no                          t yet approved or cleared by the [place] FDA and  has been authorized for detection and/or diagnosis of SARS-CoV-2 by FDA under an Emergency Use Authorization (EUA). This EUA will remain  in effect (meaning this test can be used) for the duration of the COVID-19 declaration under Section 564(b)(1) of the Act, 21 U.S.C.section 360bbb-3(b)(1), unless the authorization is terminated  or revoked sooner.       Influenza A by PCR 06/03/2022 NEGATIVE  NEGATIVE Final   Influenza B by PCR 06/03/2022 NEGATIVE  NEGATIVE Final   Comment: (NOTE) The Xpert Xpress SARS-CoV-2/FLU/RSV plus assay is intended as an aid in the diagnosis of influenza from Nasopharyngeal swab specimens and should not be used as a sole basis  for treatment. Nasal washings and aspirates are unacceptable for Xpert Xpress SARS-CoV-2/FLU/RSV testing.  Fact Sheet for Patients: BloggerCourse.com  Fact Sheet for Healthcare Providers: SeriousBroker.it  This test is not yet approved or cleared by the [place] FDA and has been authorized for detection and/or diagnosis of SARS-CoV-2 by FDA under an Emergency Use Authorization (EUA). This EUA will remain in effect (meaning this test can be used) for the duration of the COVID-19 declaration under Section 564(b)(1) of the Act, 21 U.S.C. section 360bbb-3(b)(1), unless the authorization is terminated or revoked.     Resp Syncytial Virus by PCR 06/03/2022 NEGATIVE  NEGATIVE Final   Comment: (NOTE) Fact Sheet for Patients: BloggerCourse.com  Fact Sheet for Healthcare Providers: SeriousBroker.it  This test is not yet approved or cleared by the [place] FDA and has been authorized for detection and/or diagnosis of SARS-CoV-2 by FDA under an Emergency Use Authorization (EUA). This EUA will remain in effect (meaning this test can be used) for the duration of the COVID-19 declaration under Section 564(b)(1) of the Act, 21 U.S.C. section 360bbb-3(b)(1), unless the authorization is terminated or revoked.  Performed at [facility], [address] [place], [place] [address]   Admission on 04/26/2022, Discharged on 04/27/2022  Component Date Value Ref Range Status   WBC 04/26/2022 9.3  4.0 - 10.5 K/uL Final   RBC 04/26/2022 5.25  4.22 - 5.81 MIL/uL Final   Hemoglobin 04/26/2022 15.4  13.0 - 17.0 g/dL Final   HCT 60/45/4098 46.4  39.0 - 52.0 % Final   MCV 04/26/2022 88.4  80.0 - 100.0 fL Final   MCH 04/26/2022 29.3  26.0 - 34.0 pg Final   MCHC 04/26/2022 33.2  30.0 - 36.0 g/dL Final   RDW 11/91/4782 14.0  11.5 - 15.5 % Final   Platelets 04/26/2022 250  150 - 400 K/uL  Final   nRBC 04/26/2022 0.0  0.0 - 0.2 % Final   Neutrophils Relative % 04/26/2022 47  % Final   Neutro Abs 04/26/2022 4.3  1.7 - 7.7 K/uL Final   Lymphocytes Relative 04/26/2022 46  % Final   Lymphs  Abs 04/26/2022 4.3 (H)  0.7 - 4.0 K/uL Final   Monocytes Relative 04/26/2022 6  % Final   Monocytes Absolute 04/26/2022 0.5  0.1 - 1.0 K/uL Final   Eosinophils Relative 04/26/2022 1  % Final   Eosinophils Absolute 04/26/2022 0.1  0.0 - 0.5 K/uL Final   Basophils Relative 04/26/2022 0  % Final   Basophils Absolute 04/26/2022 0.0  0.0 - 0.1 K/uL Final   Immature Granulocytes 04/26/2022 0  % Final   Abs Immature Granulocytes 04/26/2022 0.02  0.00 - 0.07 K/uL Final   Performed at [facility], [address], [place], [place] [address]   Sodium 04/26/2022 140  135 - 145 mmol/L Final   Potassium 04/26/2022 3.8  3.5 - 5.1 mmol/L Final   Chloride 04/26/2022 105  98 - 111 mmol/L Final   CO2 04/26/2022 29  22 - 32 mmol/L Final   Glucose, Bld 04/26/2022 86  70 - 99 mg/dL Final   Glucose reference range applies only to samples taken after fasting for at least 8 hours.   BUN 04/26/2022 10  6 - 20 mg/dL Final   Creatinine, Ser 04/26/2022 1.02  0.61 - 1.24 mg/dL Final   Calcium 60/45/4098 9.5  8.9 - 10.3 mg/dL Final   Total Protein 11/91/4782 7.0  6.5 - 8.1 g/dL Final   Albumin 95/62/1308 4.5  3.5 - 5.0 g/dL Final   AST 65/78/4696 15  15 - 41 U/L Final   ALT 04/26/2022 14  0 - 44 U/L Final   Alkaline Phosphatase 04/26/2022 48  38 - 126 U/L Final   Total Bilirubin 04/26/2022 0.6  0.3 - 1.2 mg/dL Final   GFR, Estimated 04/26/2022 >60  >60 mL/min Final   Comment: (NOTE) Calculated using the CKD-EPI Creatinine Equation (2021)    Anion gap 04/26/2022 6  5 - 15 Final   Performed at [facility], [address], [place], [place] [address]   Alcohol, Ethyl (B) 04/26/2022 <10  <10 mg/dL Final   Comment: (NOTE) Lowest detectable limit for serum alcohol is 10  mg/dL.  For medical purposes only. Performed at [facility], [address] [place], [place] [address]    Opiates 04/26/2022 NONE DETECTED  NONE DETECTED Final   Cocaine 04/26/2022 NONE DETECTED  NONE DETECTED Final   Benzodiazepines 04/26/2022 NONE DETECTED  NONE DETECTED Final   Amphetamines 04/26/2022 POSITIVE (A)  NONE DETECTED Final   Tetrahydrocannabinol 04/26/2022 POSITIVE (A)  NONE DETECTED Final   Barbiturates 04/26/2022 NONE DETECTED  NONE DETECTED Final   Comment: (NOTE) DRUG SCREEN FOR MEDICAL PURPOSES ONLY.  IF CONFIRMATION IS NEEDED FOR ANY PURPOSE, NOTIFY LAB WITHIN 5 DAYS.  LOWEST DETECTABLE LIMITS FOR URINE DRUG SCREEN Drug Class                     Cutoff (ng/mL) Amphetamine and metabolites    1000 Barbiturate and metabolites    200 Benzodiazepine                 200 Opiates and metabolites        300 Cocaine and metabolites        300 THC                            50 Performed at [facility], [address], [place], [place] [address]     Allergies: Amoxicillin, Penicillins, Other, Pertussis vaccines, Tape,  and Blue mussel [mytilus]  Medications:  Facility Ordered Medications  Medication   acetaminophen (TYLENOL) tablet 650 mg   alum & mag hydroxide-simeth (MAALOX/MYLANTA) 200-200-20 MG/5ML suspension 30 mL   magnesium hydroxide (MILK OF MAGNESIA) suspension 30 mL   hydrOXYzine (ATARAX) tablet 25 mg   traZODone (DESYREL) tablet 50 mg   OLANZapine (ZYPREXA) tablet 15 mg   [START ON 09/18/2022] OLANZapine (ZYPREXA) tablet 5 mg   prazosin (MINIPRESS) capsule 2 mg   PTA Medications  Medication Sig   albuterol (VENTOLIN HFA) 108 (90 Base) MCG/ACT inhaler Inhale 2 puffs into the lungs every 6 (six) hours as needed for wheezing or shortness of breath.   prazosin (MINIPRESS) 2 MG capsule Take 2 mg by mouth at bedtime.   QUEtiapine (SEROQUEL) 100 MG tablet Take 100 mg by mouth at bedtime.   divalproex (DEPAKOTE) 500  MG DR tablet Take 500 mg by mouth 2 (two) times daily.   Vitamin D, Ergocalciferol, (DRISDOL) 1.25 MG (50000 UNIT) CAPS capsule Take 1 capsule (50,000 Units total) by mouth every 7 (seven) days. (Patient not taking: Reported on 09/16/2022)   QUEtiapine (SEROQUEL) 200 MG tablet Take 200 mg by mouth every 8 (eight) hours.   hydrOXYzine (VISTARIL) 25 MG capsule Take 25 mg by mouth every 6 (six) hours as needed for anxiety.   lithium carbonate (LITHOBID) 300 MG ER tablet Take 300 mg by mouth 3 (three) times daily.   acetaminophen (TYLENOL) 500 MG tablet Take 1,000 mg by mouth every 6 (six) hours as needed for mild pain or headache.   OLANZapine (ZYPREXA) 15 MG tablet Take 15 mg by mouth at bedtime.   OLANZapine (ZYPREXA) 5 MG tablet Take 5 mg by mouth in the morning.    Medical Decision Making  Patient admitted to [facility] for continuous assessment with follow up by psychiatry on 09/18/2022.   Resume medication -Zyprexa 15mg /night  -Zyprexa 5mg /QAM -Prazosin 2mg /QHS   Recommendations  Based on my evaluation the patient does not appear to have an emergency medical condition.  Maricela Bo, NP 09/17/22  9:25 PM

## 2022-09-17 NOTE — Progress Notes (Addendum)
Per Intake with [facility] pt has been denied due to recent admission within the last 3 months. This CSW/ Disposition team will continue to assist with inpatient behavioral health placement.  Maryjean Ka, MSW, LCSWA 09/17/2022 1:27 AM

## 2022-09-17 NOTE — ED Notes (Signed)
Pt A&O x 4, presents with suicidal ideation, plan [place] train tracks.  Denies HI or AVH.  Unable to contract for safety.  Comfort measures given.  Monitoring for safety.  Pending [facility] in am.

## 2022-09-17 NOTE — ED Notes (Signed)
Safe transport called for transport to [facility] 

## 2022-09-17 NOTE — ED Notes (Addendum)
A/o pleasant. Taking scheduled meds. Will continue to monitor for safety

## 2022-09-17 NOTE — ED Notes (Signed)
Pt sleeping in recliner . No noted distress. RR even and unlabored

## 2022-09-17 NOTE — ED Provider Notes (Signed)
Emergency Medicine Observation Re-evaluation Note  Jon Clayton is a 27 y.o. male, seen on rounds today.  Pt initially presented to the ED for complaints of Suicidal Currently, the patient is awaiting placement for suicidal ideation.  Physical Exam  BP 111/66 (BP Location: Right Arm)   Pulse 82   Temp 97.9 F (36.6 C) (Oral)   Resp 16   Ht 5\' 7"  (1.702 m)   Wt 88 kg   SpO2 98%   BMI 30.38 kg/m  Physical Exam Awake and in no acute distress  ED Course / MDM  EKG:EKG Interpretation  Date/Time:  Friday September 16 2022 11:50:19 EDT Ventricular Rate:  89 PR Interval:  148 QRS Duration: 86 QT Interval:  364 QTC Calculation: 442 R Axis:   53 Text Interpretation: Normal sinus rhythm no acute ST/T changes similar to Mar 2024 Confirmed by Pricilla Loveless [phone]) on 09/16/2022 1:09:49 PM  I have reviewed the labs performed to date as well as medications administered while in observation.  Recent changes in the last 24 hours include none.  Plan  Current plan is for psychiatric admission.    Bethann Berkshire, MD 09/17/22 [phone]

## 2022-09-17 NOTE — ED Notes (Signed)
Psych NP in room

## 2022-09-17 NOTE — ED Provider Notes (Signed)
Patient reassessed and is in stable.  Accepted to [facility] by Dr. Lucianne Muss.   Rondel Baton, MD 09/17/22 Mikle Bosworth

## 2022-09-17 NOTE — Progress Notes (Addendum)
Pt was accepted [facility] OBS TODAY 09/17/22 per [facility] AC Regan Lemming, RN. ED NURSING please fax IVC paperwork to [facility] [phone].  Pt meets inpatient criteria per Alona Bene, PMHNP  Attending Physician will be Dr. Lucianne Muss  Report can be called to: - [phone]   Pt can arrive after [facility] will coordinate with care team.  Care Team Notified: [facility] AC Antoinette Gillian Shields, PMHNP, Bedelia Person, RN, Regan Lemming, RN, Bethann Berkshire, MD, Durwin Reges, RN, Bertram Millard, RN, Charletta Cousin, RN  Kelton Pillar, LCSWA 09/17/2022 @ 2:08 PM

## 2022-09-17 NOTE — ED Notes (Signed)
Pt escorted to Safe transport vehicle without difficulty. Pts belongings given to Safe transport. RN visualized pt getting into [employer].

## 2022-09-18 MED ORDER — BENZOCAINE 10 % MT GEL
1.0000 | Freq: Once | OROMUCOSAL | Status: AC
  Administered 2022-09-18: 1 via OROMUCOSAL
  Filled 2022-09-18: qty 9

## 2022-09-18 NOTE — ED Notes (Addendum)
Patient is potentially going to [facility] tomorrow per provider.

## 2022-09-18 NOTE — ED Notes (Signed)
When speaking to patient now he stated that he stills feels like ending his life.When asked about what he thinks about when thoughts of self harm come into his mind he stated "I am not where I had expected to be in my life. I figured I would have a house and a job by the time I was this age.... I figured I would go back to school . Now I have an eighth grade education and no place to live >>" Patient is very discouraged with his current situation. " Will continue monitor for safety.  Marland Kitchen

## 2022-09-18 NOTE — ED Notes (Signed)
Pt sleeping at present, no distress noted.  Monitoring for safety. 

## 2022-09-18 NOTE — ED Provider Notes (Signed)
Behavioral Health Progress Note  Date and Time: 09/18/2022 12:02 PM Name: Jon Clayton MRN:  [id number]  Subjective:    On reassessment pt reports presenting to this facility following initial presentation to [facility]. Pt reports "I keep trying to get help". Reports recent hospitalization at [facility] for 3 weeks following suicide attempt. Pt has had multiple ED visits and inpatient psychiatric hospitalizations. He reports prior to being seen at [facility] he was seen at [facility] where he states he was discharged "after 4 hours". Per note from [facility], pt was brought in by police with IVC paperwork from [facility] after reporting SI/HI and hallucinations. Pt reports he is a sex offender and is currently on probation. He reports he is homeless and unemployed.   Pt endorses passive suicidal ideations, "I don't want to be alive but I don't want to die". He denies plan or intent. He denies VI/HI. He denies AVH, paranoia.   He states he is supposed to present to [facility] tomorrow before 1PM for residential substance use treatment. 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 He states he has been to [facility] before. Discussed additional night in continuous assessment and discharge to [facility] tomorrow. Pt is agreeable to plan.  Diagnosis:  Final diagnoses:  Depression, unspecified depression type    Total Time spent with patient: 30 minutes  Past Psychiatric History: Hx of depression, psychosis, ptsd, malingering, suicidal ideation Past Medical History: None reported Family History: None reported Family Psychiatric  History: None reported Social History: Reported sex offender, homeless, unemployed  Additional Social History:                         Sleep: Poor  Appetite:  Fair  Current Medications:  Current Facility-Administered Medications  Medication Dose Route Frequency Provider Last Rate Last Admin   acetaminophen (TYLENOL) tablet 650 mg  650 mg Oral Q6H PRN Ajibola, Ene A, NP    650 mg at 09/18/22 0622   alum & mag hydroxide-simeth (MAALOX/MYLANTA) 200-200-20 MG/5ML suspension 30 mL  30 mL Oral Q4H PRN Ajibola, Ene A, NP       hydrOXYzine (ATARAX) tablet 25 mg  25 mg Oral TID PRN Ajibola, Ene A, NP   25 mg at 09/17/22 2112   magnesium hydroxide (MILK OF MAGNESIA) suspension 30 mL  30 mL Oral Daily PRN Ajibola, Ene A, NP       OLANZapine (ZYPREXA) tablet 15 mg  15 mg Oral QHS Ajibola, Ene A, NP   15 mg at 09/17/22 2112   OLANZapine (ZYPREXA) tablet 5 mg  5 mg Oral q AM Ajibola, Ene A, NP   5 mg at 09/18/22 1856   prazosin (MINIPRESS) capsule 2 mg  2 mg Oral QHS Ajibola, Ene A, NP   2 mg at 09/17/22 2113   traZODone (DESYREL) tablet 50 mg  50 mg Oral QHS PRN Ajibola, Ene A, NP   50 mg at 09/18/22 0034   Current Outpatient Medications  Medication Sig Dispense Refill   acetaminophen (TYLENOL) 500 MG tablet Take 1,000 mg by mouth every 6 (six) hours as needed for mild pain or headache.     albuterol (VENTOLIN HFA) 108 (90 Base) MCG/ACT inhaler Inhale 2 puffs into the lungs every 6 (six) hours as needed for wheezing or shortness of breath. 6.7 g 0   divalproex (DEPAKOTE) 500 MG DR tablet Take 500 mg by mouth 2 (two) times daily.     hydrOXYzine (VISTARIL) 25 MG capsule Take  25 mg by mouth every 6 (six) hours as needed for anxiety.     lithium carbonate (LITHOBID) 300 MG ER tablet Take 300 mg by mouth 3 (three) times daily.     OLANZapine (ZYPREXA) 15 MG tablet Take 15 mg by mouth at bedtime.     OLANZapine (ZYPREXA) 5 MG tablet Take 5 mg by mouth in the morning.     prazosin (MINIPRESS) 2 MG capsule Take 2 mg by mouth at bedtime.     QUEtiapine (SEROQUEL) 100 MG tablet Take 100 mg by mouth at bedtime.     QUEtiapine (SEROQUEL) 200 MG tablet Take 200 mg by mouth every 8 (eight) hours.     Vitamin D, Ergocalciferol, (DRISDOL) 1.25 MG (50000 UNIT) CAPS capsule Take 1 capsule (50,000 Units total) by mouth every 7 (seven) days. (Patient not taking: Reported on 09/16/2022) 4  capsule 2    Labs  Lab Results:  Admission on 09/16/2022, Discharged on 09/17/2022  Component Date Value Ref Range Status   Opiates 09/16/2022 NONE DETECTED  NONE DETECTED Final   Cocaine 09/16/2022 NONE DETECTED  NONE DETECTED Final   Benzodiazepines 09/16/2022 NONE DETECTED  NONE DETECTED Final   Amphetamines 09/16/2022 NONE DETECTED  NONE DETECTED Final   Tetrahydrocannabinol 09/16/2022 NONE DETECTED  NONE DETECTED Final   Barbiturates 09/16/2022 NONE DETECTED  NONE DETECTED Final   Comment: (NOTE) DRUG SCREEN FOR MEDICAL PURPOSES ONLY.  IF CONFIRMATION IS NEEDED FOR ANY PURPOSE, NOTIFY LAB WITHIN 5 DAYS.  LOWEST DETECTABLE LIMITS FOR URINE DRUG SCREEN Drug Class                     Cutoff (ng/mL) Amphetamine and metabolites    1000 Barbiturate and metabolites    200 Benzodiazepine                 200 Opiates and metabolites        300 Cocaine and metabolites        300 THC                            50 Performed at [facility], [address] [place], [place] [address]    Sodium 09/16/2022 137  135 - 145 mmol/L Final   Potassium 09/16/2022 3.9  3.5 - 5.1 mmol/L Final   Chloride 09/16/2022 104  98 - 111 mmol/L Final   CO2 09/16/2022 26  22 - 32 mmol/L Final   Glucose, Bld 09/16/2022 80  70 - 99 mg/dL Final   Glucose reference range applies only to samples taken after fasting for at least 8 hours.   BUN 09/16/2022 18  6 - 20 mg/dL Final   Creatinine, Ser 09/16/2022 0.87  0.61 - 1.24 mg/dL Final   Calcium 60/45/4098 8.6 (L)  8.9 - 10.3 mg/dL Final   Total Protein 11/91/4782 6.8  6.5 - 8.1 g/dL Final   Albumin 95/62/1308 4.2  3.5 - 5.0 g/dL Final   AST 65/78/4696 34  15 - 41 U/L Final   ALT 09/16/2022 36  0 - 44 U/L Final   Alkaline Phosphatase 09/16/2022 42  38 - 126 U/L Final   Total Bilirubin 09/16/2022 0.5  0.3 - 1.2 mg/dL Final   GFR, Estimated 09/16/2022 >60  >60 mL/min Final   Comment: (NOTE) Calculated using the CKD-EPI Creatinine Equation  (2021)    Anion gap 09/16/2022 7  5 - 15 Final   Performed at [facility], [address], [place], [place] [address]   Alcohol, Ethyl (B) 09/16/2022 <10  <10 mg/dL Final   Comment: (NOTE) Lowest detectable limit for serum alcohol is 10 mg/dL.  For medical purposes only. Performed at [facility], [address], [place], [place] [address]    WBC 09/16/2022 7.4  4.0 - 10.5 K/uL Final   RBC 09/16/2022 4.89  4.22 - 5.81 MIL/uL Final   Hemoglobin 09/16/2022 14.5  13.0 - 17.0 g/dL Final   HCT 09/81/1914 42.6  39.0 - 52.0 % Final   MCV 09/16/2022 87.1  80.0 - 100.0 fL Final   MCH 09/16/2022 29.7  26.0 - 34.0 pg Final   MCHC 09/16/2022 34.0  30.0 - 36.0 g/dL Final   RDW 78/29/5621 13.2  11.5 - 15.5 % Final   Platelets 09/16/2022 229  150 - 400 K/uL Final   nRBC 09/16/2022 0.0  0.0 - 0.2 % Final   Neutrophils Relative % 09/16/2022 59  % Final   Neutro Abs 09/16/2022 4.3  1.7 - 7.7 K/uL Final   Lymphocytes Relative 09/16/2022 33  % Final   Lymphs Abs 09/16/2022 2.5  0.7 - 4.0 K/uL Final   Monocytes Relative 09/16/2022 7  % Final   Monocytes Absolute 09/16/2022 0.5  0.1 - 1.0 K/uL Final   Eosinophils Relative 09/16/2022 1  % Final   Eosinophils Absolute 09/16/2022 0.1  0.0 - 0.5 K/uL Final   Basophils Relative 09/16/2022 0  % Final   Basophils Absolute 09/16/2022 0.0  0.0 - 0.1 K/uL Final   Immature Granulocytes 09/16/2022 0  % Final   Abs Immature Granulocytes 09/16/2022 0.03  0.00 - 0.07 K/uL Final   Performed at [facility] [address], [place], [place] [address]   Total CK 09/16/2022 498 (H)  49 - 397 U/L Final   Performed at [facility], [address], [place], [place] [address]   Acetaminophen (Tylenol), Serum 09/16/2022 <10 (L)  10 - 30 ug/mL Final   Comment: (NOTE) Therapeutic concentrations vary significantly. A range of 10-30 ug/mL  may be an effective concentration for many patients. However,  some  are best treated at concentrations outside of this range. Acetaminophen concentrations >150 ug/mL at 4 hours after ingestion  and >50 ug/mL at 12 hours after ingestion are often associated with  toxic reactions.  Performed at [facility], [address] [place], [place] [address]    Salicylate Lvl 09/16/2022 <7.0 (L)  7.0 - 30.0 mg/dL Final   Performed at [facility], [address] [place], [place] [address]   Valproic Acid Lvl 09/16/2022 <10 (L)  50.0 - 100.0 ug/mL Final   Comment: RESULT CONFIRMED BY MANUAL DILUTION Performed at [facility], [address], [place], [place] [address]    SARS Coronavirus 2 by RT PCR 09/16/2022 NEGATIVE  NEGATIVE Final   Comment: (NOTE) SARS-CoV-2 target nucleic acids are NOT DETECTED.  The SARS-CoV-2 RNA is generally detectable in upper and lower respiratory specimens during the acute phase of infection. The lowest concentration of SARS-CoV-2 viral copies this assay can detect is 250 copies / mL. A negative result does not preclude SARS-CoV-2 infection and should not be used as the sole basis for treatment or other patient management decisions.  A negative result may occur with improper specimen collection / handling, submission of specimen other than nasopharyngeal swab, presence of viral mutation(s) within the areas targeted by this assay,  and inadequate number of viral copies (<250 copies / mL). A negative result must be combined with clinical observations, patient history, and epidemiological information.  Fact Sheet for Patients:   RoadLapTop.co.za  Fact Sheet for Healthcare Providers: http://kim-Ulloa.com/  This test is not yet approved or                           cleared by the [place] FDA and has been authorized for detection and/or diagnosis of SARS-CoV-2 by FDA under an Emergency Use Authorization (EUA).  This EUA  will remain in effect (meaning this test can be used) for the duration of the COVID-19 declaration under Section 564(b)(1) of the Act, 21 U.S.C. section 360bbb-3(b)(1), unless the authorization is terminated or revoked sooner.  Performed at [facility], [address] [place], [place] [address]    Lithium Lvl 09/17/2022 <0.06 (L)  0.60 - 1.20 mmol/L Final   Performed at [facility], [address] [place], [place] [address]  Admission on 08/18/2022, Discharged on 08/20/2022  Component Date Value Ref Range Status   Sodium 08/18/2022 139  135 - 145 mmol/L Final   Potassium 08/18/2022 3.9  3.5 - 5.1 mmol/L Final   Chloride 08/18/2022 106  98 - 111 mmol/L Final   CO2 08/18/2022 24  22 - 32 mmol/L Final   Glucose, Bld 08/18/2022 98  70 - 99 mg/dL Final   Glucose reference range applies only to samples taken after fasting for at least 8 hours.   BUN 08/18/2022 13  6 - 20 mg/dL Final   Creatinine, Ser 08/18/2022 0.91  0.61 - 1.24 mg/dL Final   Calcium 47/82/9562 8.9  8.9 - 10.3 mg/dL Final   Total Protein 13/01/6577 7.0  6.5 - 8.1 g/dL Final   Albumin 46/96/2952 4.2  3.5 - 5.0 g/dL Final   AST 84/13/2440 19  15 - 41 U/L Final   ALT 08/18/2022 15  0 - 44 U/L Final   Alkaline Phosphatase 08/18/2022 44  38 - 126 U/L Final   Total Bilirubin 08/18/2022 0.6  0.3 - 1.2 mg/dL Final   GFR, Estimated 08/18/2022 >60  >60 mL/min Final   Comment: (NOTE) Calculated using the CKD-EPI Creatinine Equation (2021)    Anion gap 08/18/2022 9  5 - 15 Final   Performed at [facility], [address] [place], [place] [address]   Alcohol, Ethyl (B) 08/18/2022 <10  <10 mg/dL Final   Comment: (NOTE) Lowest detectable limit for serum alcohol is 10 mg/dL.  For medical purposes only. Performed at [facility], [address] [place], [place] [address]    Salicylate Lvl 08/18/2022 <7.0 (L)  7.0 - 30.0 mg/dL Final   Performed at [facility], [address] [place], [place] [address]   Acetaminophen (Tylenol), Serum 08/18/2022 <10 (L)  10 - 30 ug/mL Final   Comment: (NOTE) Therapeutic concentrations vary significantly. A range of 10-30 ug/mL  may be an effective concentration for many patients. However, some  are best treated at concentrations outside of this range. Acetaminophen concentrations >150 ug/mL at 4 hours after ingestion  and >50 ug/mL at 12 hours after ingestion are often associated with  toxic reactions.  Performed at [facility], [address] [place], [place] [address]    WBC 08/18/2022 5.7  4.0 - 10.5 K/uL Final   RBC 08/18/2022 4.87  4.22 -  5.81 MIL/uL Final   Hemoglobin 08/18/2022 14.4  13.0 - 17.0 g/dL Final   HCT 28/41/3244 43.7  39.0 - 52.0 % Final   MCV 08/18/2022 89.7  80.0 - 100.0 fL Final   MCH 08/18/2022 29.6  26.0 - 34.0 pg Final   MCHC 08/18/2022 33.0  30.0 - 36.0 g/dL Final   RDW 06/15/7251 13.2  11.5 - 15.5 % Final   Platelets 08/18/2022 190  150 - 400 K/uL Final   nRBC 08/18/2022 0.0  0.0 - 0.2 % Final   Performed at [facility], [address] [place], [place] [address]   Glucose-Capillary 08/19/2022 76  70 - 99 mg/dL Final   Glucose reference range applies only to samples taken after fasting for at least 8 hours.   Acetaminophen (Tylenol), Serum 08/18/2022 <10 (L)  10 - 30 ug/mL Final   Comment: (NOTE) Therapeutic concentrations vary significantly. A range of 10-30 ug/mL  may be an effective concentration for many patients. However, some  are best treated at concentrations outside of this range. Acetaminophen concentrations >150 ug/mL at 4 hours after ingestion  and >50 ug/mL at 12 hours after ingestion are often associated with  toxic reactions.  Performed at [facility], [address] [place], [place] [address]    Sodium 08/18/2022 138  135 - 145 mmol/L Final   Potassium 08/18/2022 4.2  3.5 - 5.1 mmol/L Final   Chloride 08/18/2022  106  98 - 111 mmol/L Final   CO2 08/18/2022 26  22 - 32 mmol/L Final   Glucose, Bld 08/18/2022 85  70 - 99 mg/dL Final   Glucose reference range applies only to samples taken after fasting for at least 8 hours.   BUN 08/18/2022 14  6 - 20 mg/dL Final   Creatinine, Ser 08/18/2022 0.90  0.61 - 1.24 mg/dL Final   Calcium 59/56/3875 8.7 (L)  8.9 - 10.3 mg/dL Final   Total Protein 64/33/2951 6.6  6.5 - 8.1 g/dL Final   Albumin 88/41/6606 3.8  3.5 - 5.0 g/dL Final   AST 30/16/0109 16  15 - 41 U/L Final   ALT 08/18/2022 16  0 - 44 U/L Final   Alkaline Phosphatase 08/18/2022 42  38 - 126 U/L Final   Total Bilirubin 08/18/2022 0.7  0.3 - 1.2 mg/dL Final   GFR, Estimated 08/18/2022 >60  >60 mL/min Final   Comment: (NOTE) Calculated using the CKD-EPI Creatinine Equation (2021)    Anion gap 08/18/2022 6  5 - 15 Final   Performed at [facility], [address] [place], [place] [address]   Glucose-Capillary 08/19/2022 82  70 - 99 mg/dL Final   Glucose reference range applies only to samples taken after fasting for at least 8 hours.   SARS Coronavirus 2 by RT PCR 08/19/2022 NEGATIVE  NEGATIVE Final   Comment: (NOTE) SARS-CoV-2 target nucleic acids are NOT DETECTED.  The SARS-CoV-2 RNA is generally detectable in upper respiratory specimens during the acute phase of infection. The lowest concentration of SARS-CoV-2 viral copies this assay can detect is 138 copies/mL. A negative result does not preclude SARS-Cov-2 infection and should not be used as the sole basis for treatment or other patient management decisions. A negative result may occur with  improper specimen collection/handling, submission of specimen other than nasopharyngeal swab, presence of viral mutation(s) within the areas targeted by this assay, and inadequate number of viral copies(<138 copies/mL). A negative result must be combined with  clinical observations, patient history, and epidemiological information. The  expected result is Negative.  Fact Sheet for Patients:  BloggerCourse.com  Fact Sheet for Healthcare Providers:  SeriousBroker.it  This test is no                          t yet approved or cleared by the [place] FDA and  has been authorized for detection and/or diagnosis of SARS-CoV-2 by FDA under an Emergency Use Authorization (EUA). This EUA will remain  in effect (meaning this test can be used) for the duration of the COVID-19 declaration under Section 564(b)(1) of the Act, 21 U.S.C.section 360bbb-3(b)(1), unless the authorization is terminated  or revoked sooner.       Influenza A by PCR 08/19/2022 NEGATIVE  NEGATIVE Final   Influenza B by PCR 08/19/2022 NEGATIVE  NEGATIVE Final   Comment: (NOTE) The Xpert Xpress SARS-CoV-2/FLU/RSV plus assay is intended as an aid in the diagnosis of influenza from Nasopharyngeal swab specimens and should not be used as a sole basis for treatment. Nasal washings and aspirates are unacceptable for Xpert Xpress SARS-CoV-2/FLU/RSV testing.  Fact Sheet for Patients: BloggerCourse.com  Fact Sheet for Healthcare Providers: SeriousBroker.it  This test is not yet approved or cleared by the [place] FDA and has been authorized for detection and/or diagnosis of SARS-CoV-2 by FDA under an Emergency Use Authorization (EUA). This EUA will remain in effect (meaning this test can be used) for the duration of the COVID-19 declaration under Section 564(b)(1) of the Act, 21 U.S.C. section 360bbb-3(b)(1), unless the authorization is terminated or revoked.     Resp Syncytial Virus by PCR 08/19/2022 NEGATIVE  NEGATIVE Final   Comment: (NOTE) Fact Sheet for Patients: BloggerCourse.com  Fact Sheet for Healthcare Providers: SeriousBroker.it  This test is not yet approved or cleared by the [place] FDA and has been authorized for detection and/or diagnosis of SARS-CoV-2 by FDA under an Emergency Use Authorization (EUA). This EUA will remain in effect (meaning this test can be used) for the duration of the COVID-19 declaration under Section 564(b)(1) of the Act, 21 U.S.C. section 360bbb-3(b)(1), unless the authorization is terminated or revoked.  Performed at [facility], [address] [place], [place] [address]    Color, Urine 08/19/2022 YELLOW  YELLOW Final   APPearance 08/19/2022 CLEAR  CLEAR Final   Specific Gravity, Urine 08/19/2022 1.025  1.005 - 1.030 Final   pH 08/19/2022 6.0  5.0 - 8.0 Final   Glucose, UA 08/19/2022 NEGATIVE  NEGATIVE mg/dL Final   Hgb urine dipstick 08/19/2022 NEGATIVE  NEGATIVE Final   Bilirubin Urine 08/19/2022 NEGATIVE  NEGATIVE Final   Ketones, ur 08/19/2022 NEGATIVE  NEGATIVE mg/dL Final   Protein, ur 57/84/6962 NEGATIVE  NEGATIVE mg/dL Final   Nitrite 95/28/4132 NEGATIVE  NEGATIVE Final   Leukocytes,Ua 08/19/2022 NEGATIVE  NEGATIVE Final   Comment: Microscopic not done on urines with negative protein, blood, leukocytes, nitrite, or glucose < 500 mg/dL. Performed at [facility], [address] [place], [place] [address]   Admission on 08/18/2022, Discharged on 08/18/2022  Component Date Value Ref Range Status   Sodium 08/18/2022 137  135 - 145 mmol/L Final   Potassium 08/18/2022 3.7  3.5 - 5.1 mmol/L Final   Chloride 08/18/2022 107  98 - 111 mmol/L Final   CO2 08/18/2022 22  22 - 32 mmol/L Final   Glucose, Bld 08/18/2022 122 (H)  70 - 99  mg/dL Final   Glucose reference range applies only to samples taken after fasting for at least 8 hours.   BUN 08/18/2022 13  6 - 20 mg/dL Final   Creatinine, Ser 08/18/2022 0.94  0.61 - 1.24 mg/dL Final   Calcium 16/03/9603 8.3 (L)  8.9 - 10.3 mg/dL Final   Total Protein 54/02/8118 6.6  6.5 - 8.1 g/dL Final   Albumin 14/78/2956 3.8  3.5 - 5.0 g/dL Final   AST 21/30/8657 28   15 - 41 U/L Final   ALT 08/18/2022 12  0 - 44 U/L Final   Alkaline Phosphatase 08/18/2022 43  38 - 126 U/L Final   Total Bilirubin 08/18/2022 0.4  0.3 - 1.2 mg/dL Final   GFR, Estimated 08/18/2022 >60  >60 mL/min Final   Comment: (NOTE) Calculated using the CKD-EPI Creatinine Equation (2021)    Anion gap 08/18/2022 8  5 - 15 Final   Performed at [facility], [address] [place], [place] [address]   Alcohol, Ethyl (B) 08/18/2022 <10  <10 mg/dL Final   Comment: (NOTE) Lowest detectable limit for serum alcohol is 10 mg/dL.  For medical purposes only. Performed at [facility], [address] [place], [place] [address]    Salicylate Lvl 08/18/2022 <7.0 (L)  7.0 - 30.0 mg/dL Final   Performed at [facility], [address] [place], [place] [address]   Acetaminophen (Tylenol), Serum 08/18/2022 <10 (L)  10 - 30 ug/mL Final   Comment: (NOTE) Therapeutic concentrations vary significantly. A range of 10-30 ug/mL  may be an effective concentration for many patients. However, some  are best treated at concentrations outside of this range. Acetaminophen concentrations >150 ug/mL at 4 hours after ingestion  and >50 ug/mL at 12 hours after ingestion are often associated with  toxic reactions.  Performed at [facility], [address] [place], [place] [address]    WBC 08/18/2022 5.8  4.0 - 10.5 K/uL Final   RBC 08/18/2022 4.71  4.22 - 5.81 MIL/uL Final   Hemoglobin 08/18/2022 13.9  13.0 - 17.0 g/dL Final   HCT 72/53/6644 42.6  39.0 - 52.0 % Final   MCV 08/18/2022 90.4  80.0 - 100.0 fL Final   MCH 08/18/2022 29.5  26.0 - 34.0 pg Final   MCHC 08/18/2022 32.6  30.0 - 36.0 g/dL Final   RDW 03/47/4259 13.1  11.5 - 15.5 % Final   Platelets 08/18/2022 183  150 - 400 K/uL Final   nRBC 08/18/2022 0.0  0.0 - 0.2 % Final   Performed at [facility], [address] [place], [place] [address]   Opiates 08/18/2022 NONE DETECTED  NONE  DETECTED Final   Cocaine 08/18/2022 NONE DETECTED  NONE DETECTED Final   Benzodiazepines 08/18/2022 NONE DETECTED  NONE DETECTED Final   Amphetamines 08/18/2022 NONE DETECTED  NONE DETECTED Final   Tetrahydrocannabinol 08/18/2022 NONE DETECTED  NONE DETECTED Final   Barbiturates 08/18/2022 NONE DETECTED  NONE DETECTED Final   Comment: (NOTE) DRUG SCREEN FOR MEDICAL PURPOSES ONLY.  IF CONFIRMATION IS NEEDED FOR ANY PURPOSE, NOTIFY LAB WITHIN 5 DAYS.  LOWEST DETECTABLE LIMITS FOR URINE DRUG SCREEN Drug Class                     Cutoff (ng/mL) Amphetamine and metabolites    1000 Barbiturate and metabolites    200 Benzodiazepine  200 Opiates and metabolites        300 Cocaine and metabolites        300 THC                            50 Performed at [facility], [address] [place], [place] [address]   Office Visit on 08/15/2022  Component Date Value Ref Range Status   Valproic Acid Lvl 08/15/2022 55  50 - 100 ug/mL Final   Comment:                                 Detection Limit = 4                            <4 indicates None Detected Toxicity may occur at levels of 100-500. Measurements of free unbound valproic acid may improve the assess- ment of clinical response.    Glucose 08/15/2022 107 (H)  70 - 99 mg/dL Final   BUN 45/80/9983 10  6 - 20 mg/dL Final   Creatinine, Ser 08/15/2022 0.89  0.76 - 1.27 mg/dL Final   eGFR 38/25/0539 123  >59 mL/min/1.73 Final   BUN/Creatinine Ratio 08/15/2022 11  9 - 20 Final   Sodium 08/15/2022 142  134 - 144 mmol/L Final   Potassium 08/15/2022 4.3  3.5 - 5.2 mmol/L Final   Chloride 08/15/2022 102  96 - 106 mmol/L Final   CO2 08/15/2022 22  20 - 29 mmol/L Final   Calcium 08/15/2022 9.6  8.7 - 10.2 mg/dL Final   Vit D, [id number] 08/15/2022 13.7 (L)  30.0 - 100.0 ng/mL Final   Comment: Vitamin D deficiency has been defined by the Institute of Medicine and an Endocrine Society practice guideline as a level of  serum 25-OH vitamin D less than 20 ng/mL (1,2). The Endocrine Society went on to further define vitamin D insufficiency as a level between 21 and 29 ng/mL (2). 1. IOM (Institute of Medicine). 2010. Dietary reference    intakes for calcium and D. [place]: The    [employer]. 2. Holick MF, Binkley Silt, Bischoff-Ferrari HA, et al.    Evaluation, treatment, and prevention of vitamin D    deficiency: an Endocrine Society clinical practice    guideline. JCEM. 2011 Jul; 96(7):1911-30.    TSH 08/15/2022 2.100  0.450 - 4.500 uIU/mL Final   T4, Total 08/15/2022 5.2  4.5 - 12.0 ug/dL Final   T3 Uptake Ratio 08/15/2022 28  24 - 39 % Final   Free Thyroxine Index 08/15/2022 1.5  1.2 - 4.9 Final   HCV Ab 08/15/2022 Non Reactive  Non Reactive Final   HIV Screen 4th Generation wRfx 08/15/2022 Non Reactive  Non Reactive Final   Comment: HIV Negative HIV-1/HIV-2 antibodies and HIV-1 p24 antigen were NOT detected. There is no laboratory evidence of HIV infection.    RPR Ser Ql 08/15/2022 Non Reactive  Non Reactive Final   Neisseria Gonorrhea 08/15/2022 Negative   Final   Chlamydia 08/15/2022 Negative   Final   Trichomonas 08/15/2022 Negative   Final   Comment 08/15/2022 Normal Reference Ranger Chlamydia - Negative   Final   Comment 08/15/2022 Normal Reference Range Neisseria Gonorrhea - Negative   Final   Comment 08/15/2022 Normal Reference Range Trichomonas - Negative   Final   HCV  Interp 1: 08/15/2022 Comment   Final   Comment: Not infected with HCV unless early or acute infection is suspected (which may be delayed in an immunocompromised individual), or other evidence exists to indicate HCV infection.   Admission on 07/21/2022, Discharged on 07/22/2022  Component Date Value Ref Range Status   Sodium 07/21/2022 140  135 - 145 mmol/L Final   Potassium 07/21/2022 4.3  3.5 - 5.1 mmol/L Final   Chloride 07/21/2022 105  98 - 111 mmol/L Final   CO2 07/21/2022 25  22 - 32 mmol/L Final    Glucose, Bld 07/21/2022 127 (H)  70 - 99 mg/dL Final   Glucose reference range applies only to samples taken after fasting for at least 8 hours.   BUN 07/21/2022 11  6 - 20 mg/dL Final   Creatinine, Ser 07/21/2022 0.94  0.61 - 1.24 mg/dL Final   Calcium 16/03/9603 8.7 (L)  8.9 - 10.3 mg/dL Final   Total Protein 54/02/8118 6.0 (L)  6.5 - 8.1 g/dL Final   Albumin 14/78/2956 3.8  3.5 - 5.0 g/dL Final   AST 21/30/8657 18  15 - 41 U/L Final   ALT 07/21/2022 12  0 - 44 U/L Final   Alkaline Phosphatase 07/21/2022 46  38 - 126 U/L Final   Total Bilirubin 07/21/2022 0.3  0.3 - 1.2 mg/dL Final   GFR, Estimated 07/21/2022 >60  >60 mL/min Final   Comment: (NOTE) Calculated using the CKD-EPI Creatinine Equation (2021)    Anion gap 07/21/2022 10  5 - 15 Final   Performed at [facility], [address] [place], [place] [address]   Alcohol, Ethyl (B) 07/21/2022 <10  <10 mg/dL Final   Comment: (NOTE) Lowest detectable limit for serum alcohol is 10 mg/dL.  For medical purposes only. Performed at [facility], [address] [place], [place] [address]    Salicylate Lvl 07/21/2022 <7.0 (L)  7.0 - 30.0 mg/dL Final   Performed at [facility], [address], [place], [place] [address]   Acetaminophen (Tylenol), Serum 07/21/2022 <10 (L)  10 - 30 ug/mL Final   Comment: (NOTE) Therapeutic concentrations vary significantly. A range of 10-30 ug/mL  may be an effective concentration for many patients. However, some  are best treated at concentrations outside of this range. Acetaminophen concentrations >150 ug/mL at 4 hours after ingestion  and >50 ug/mL at 12 hours after ingestion are often associated with  toxic reactions.  Performed at [facility], [address], [place], [place] [address]    WBC 07/21/2022 6.4  4.0 - 10.5 K/uL Final   RBC 07/21/2022 4.73  4.22 - 5.81 MIL/uL Final   Hemoglobin 07/21/2022 13.8  13.0 - 17.0 g/dL Final   HCT 72/53/6644 43.7  39.0 - 52.0  % Final   MCV 07/21/2022 92.4  80.0 - 100.0 fL Final   MCH 07/21/2022 29.2  26.0 - 34.0 pg Final   MCHC 07/21/2022 31.6  30.0 - 36.0 g/dL Final   RDW 03/47/4259 13.4  11.5 - 15.5 % Final   Platelets 07/21/2022 240  150 - 400 K/uL Final   nRBC 07/21/2022 0.0  0.0 - 0.2 % Final   Performed at [facility], [address] [place], [place] [address]   Opiates 07/21/2022 NONE DETECTED  NONE DETECTED Final   Cocaine 07/21/2022 NONE DETECTED  NONE DETECTED Final   Benzodiazepines 07/21/2022 NONE DETECTED  NONE DETECTED Final   Amphetamines 07/21/2022 NONE  DETECTED  NONE DETECTED Final   Tetrahydrocannabinol 07/21/2022 NONE DETECTED  NONE DETECTED Final   Barbiturates 07/21/2022 NONE DETECTED  NONE DETECTED Final   Comment: (NOTE) DRUG SCREEN FOR MEDICAL PURPOSES ONLY.  IF CONFIRMATION IS NEEDED FOR ANY PURPOSE, NOTIFY LAB WITHIN 5 DAYS.  LOWEST DETECTABLE LIMITS FOR URINE DRUG SCREEN Drug Class                     Cutoff (ng/mL) Amphetamine and metabolites    1000 Barbiturate and metabolites    200 Benzodiazepine                 200 Opiates and metabolites        300 Cocaine and metabolites        300 THC                            50 Performed at [facility], [address] [place], [place] [address]    Lithium Lvl 07/21/2022 <0.06 (L)  0.60 - 1.20 mmol/L Final   Performed at [facility], [address], [place], [place] [address]   Sodium 07/21/2022 141  135 - 145 mmol/L Final   Potassium 07/21/2022 4.3  3.5 - 5.1 mmol/L Final   Chloride 07/21/2022 107  98 - 111 mmol/L Final   CO2 07/21/2022 26  22 - 32 mmol/L Final   Glucose, Bld 07/21/2022 94  70 - 99 mg/dL Final   Glucose reference range applies only to samples taken after fasting for at least 8 hours.   BUN 07/21/2022 12  6 - 20 mg/dL Final   Creatinine, Ser 07/21/2022 0.99  0.61 - 1.24 mg/dL Final   Calcium 09/81/1914 8.5 (L)  8.9 - 10.3 mg/dL Final   Total Protein 78/29/5621 5.4 (L)  6.5 - 8.1 g/dL  Final   Albumin 30/86/5784 3.4 (L)  3.5 - 5.0 g/dL Final   AST 69/62/9528 18  15 - 41 U/L Final   ALT 07/21/2022 11  0 - 44 U/L Final   Alkaline Phosphatase 07/21/2022 38  38 - 126 U/L Final   Total Bilirubin 07/21/2022 0.6  0.3 - 1.2 mg/dL Final   GFR, Estimated 07/21/2022 >60  >60 mL/min Final   Comment: (NOTE) Calculated using the CKD-EPI Creatinine Equation (2021)    Anion gap 07/21/2022 8  5 - 15 Final   Performed at [facility], [address] [place], [place] [address]   Acetaminophen (Tylenol), Serum 07/21/2022 <10 (L)  10 - 30 ug/mL Final   Comment: (NOTE) Therapeutic concentrations vary significantly. A range of 10-30 ug/mL  may be an effective concentration for many patients. However, some  are best treated at concentrations outside of this range. Acetaminophen concentrations >150 ug/mL at 4 hours after ingestion  and >50 ug/mL at 12 hours after ingestion are often associated with  toxic reactions.  Performed at [facility], [address] [place], [place] [address]    Salicylate Lvl 07/21/2022 <7.0 (L)  7.0 - 30.0 mg/dL Final   Performed at [facility], [address] [place], [place] [address]   SARS Coronavirus 2 by RT PCR 07/22/2022 NEGATIVE  NEGATIVE Final   Influenza A by PCR 07/22/2022 NEGATIVE  NEGATIVE Final   Influenza B by PCR 07/22/2022 NEGATIVE  NEGATIVE Final   Comment: (NOTE) The Xpert Xpress SARS-CoV-2/FLU/RSV plus assay is intended as an aid in  the diagnosis of influenza from Nasopharyngeal swab specimens and should not be used as a sole basis for treatment. Nasal washings and aspirates are unacceptable for Xpert Xpress SARS-CoV-2/FLU/RSV testing.  Fact Sheet for Patients: BloggerCourse.com  Fact Sheet for Healthcare Providers: SeriousBroker.it  This test is not yet approved or cleared by the [place] FDA and has been authorized for detection and/or diagnosis of  SARS-CoV-2 by FDA under an Emergency Use Authorization (EUA). This EUA will remain in effect (meaning this test can be used) for the duration of the COVID-19 declaration under Section 564(b)(1) of the Act, 21 U.S.C. section 360bbb-3(b)(1), unless the authorization is terminated or revoked.     Resp Syncytial Virus by PCR 07/22/2022 NEGATIVE  NEGATIVE Final   Comment: (NOTE) Fact Sheet for Patients: BloggerCourse.com  Fact Sheet for Healthcare Providers: SeriousBroker.it  This test is not yet approved or cleared by the [place] FDA and has been authorized for detection and/or diagnosis of SARS-CoV-2 by FDA under an Emergency Use Authorization (EUA). This EUA will remain in effect (meaning this test can be used) for the duration of the COVID-19 declaration under Section 564(b)(1) of the Act, 21 U.S.C. section 360bbb-3(b)(1), unless the authorization is terminated or revoked.  Performed at [facility], [address] [place], [place] [address]   Admission on 07/20/2022, Discharged on 07/21/2022  Component Date Value Ref Range Status   Sodium 07/20/2022 135  135 - 145 mmol/L Final   Potassium 07/20/2022 3.7  3.5 - 5.1 mmol/L Final   Chloride 07/20/2022 102  98 - 111 mmol/L Final   CO2 07/20/2022 25  22 - 32 mmol/L Final   Glucose, Bld 07/20/2022 164 (H)  70 - 99 mg/dL Final   Glucose reference range applies only to samples taken after fasting for at least 8 hours.   BUN 07/20/2022 18  6 - 20 mg/dL Final   Creatinine, Ser 07/20/2022 1.06  0.61 - 1.24 mg/dL Final   Calcium 69/62/9528 8.4 (L)  8.9 - 10.3 mg/dL Final   Total Protein 41/32/4401 6.9  6.5 - 8.1 g/dL Final   Albumin 02/72/5366 4.2  3.5 - 5.0 g/dL Final   AST 44/08/4740 24  15 - 41 U/L Final   ALT 07/20/2022 12  0 - 44 U/L Final   Alkaline Phosphatase 07/20/2022 47  38 - 126 U/L Final   Total Bilirubin 07/20/2022 0.5  0.3 - 1.2 mg/dL Final   GFR, Estimated  07/20/2022 >60  >60 mL/min Final   Comment: (NOTE) Calculated using the CKD-EPI Creatinine Equation (2021)    Anion gap 07/20/2022 8  5 - 15 Final   Performed at [facility], [address] [place], [place] [address]   Alcohol, Ethyl (B) 07/20/2022 <10  <10 mg/dL Final   Comment: (NOTE) Lowest detectable limit for serum alcohol is 10 mg/dL.  For medical purposes only. Performed at [facility], [address] [place], [place] [address]    Salicylate Lvl 07/20/2022 <7.0 (L)  7.0 - 30.0 mg/dL Final   Performed at [facility], [address] [place], [place] [address]   Acetaminophen (Tylenol), Serum 07/20/2022 <10 (L)  10 - 30 ug/mL Final   Comment: (NOTE) Therapeutic concentrations vary significantly. A range of 10-30 ug/mL  may be an effective concentration for many patients. However, some  are best treated at concentrations outside of this range. Acetaminophen concentrations >150 ug/mL at 4 hours after ingestion  and >50  ug/mL at 12 hours after ingestion are often associated with  toxic reactions.  Performed at [facility], [address] [place], [place] [address]    WBC 07/20/2022 10.9 (H)  4.0 - 10.5 K/uL Final   RBC 07/20/2022 4.68  4.22 - 5.81 MIL/uL Final   Hemoglobin 07/20/2022 13.8  13.0 - 17.0 g/dL Final   HCT 60/45/4098 41.9  39.0 - 52.0 % Final   MCV 07/20/2022 89.5  80.0 - 100.0 fL Final   MCH 07/20/2022 29.5  26.0 - 34.0 pg Final   MCHC 07/20/2022 32.9  30.0 - 36.0 g/dL Final   RDW 11/91/4782 13.5  11.5 - 15.5 % Final   Platelets 07/20/2022 261  150 - 400 K/uL Final   nRBC 07/20/2022 0.0  0.0 - 0.2 % Final   Performed at [facility], [address] [place], [place] [address]   Opiates 07/20/2022 NONE DETECTED  NONE DETECTED Final   Cocaine 07/20/2022 NONE DETECTED  NONE DETECTED Final   Benzodiazepines 07/20/2022 NONE DETECTED  NONE DETECTED Final   Amphetamines 07/20/2022 NONE DETECTED  NONE  DETECTED Final   Tetrahydrocannabinol 07/20/2022 NONE DETECTED  NONE DETECTED Final   Barbiturates 07/20/2022 NONE DETECTED  NONE DETECTED Final   Comment: (NOTE) DRUG SCREEN FOR MEDICAL PURPOSES ONLY.  IF CONFIRMATION IS NEEDED FOR ANY PURPOSE, NOTIFY LAB WITHIN 5 DAYS.  LOWEST DETECTABLE LIMITS FOR URINE DRUG SCREEN Drug Class                     Cutoff (ng/mL) Amphetamine and metabolites    1000 Barbiturate and metabolites    200 Benzodiazepine                 200 Opiates and metabolites        300 Cocaine and metabolites        300 THC                            50 Performed at [facility], [address] [place], [place] [address]    Lithium Lvl 07/21/2022 <0.06 (L)  0.60 - 1.20 mmol/L Final   Performed at [facility], [address], [place], [place] [address]  Admission on 07/04/2022, Discharged on 07/07/2022  Component Date Value Ref Range Status   SARS Coronavirus 2 by RT PCR 07/04/2022 NEGATIVE  NEGATIVE Final   Comment: (NOTE) SARS-CoV-2 target nucleic acids are NOT DETECTED.  The SARS-CoV-2 RNA is generally detectable in upper respiratory specimens during the acute phase of infection. The lowest concentration of SARS-CoV-2 viral copies this assay can detect is 138 copies/mL. A negative result does not preclude SARS-Cov-2 infection and should not be used as the sole basis for treatment or other patient management decisions. A negative result may occur with  improper specimen collection/handling, submission of specimen other than nasopharyngeal swab, presence of viral mutation(s) within the areas targeted by this assay, and inadequate number of viral copies(<138 copies/mL). A negative result must be combined with clinical observations, patient history, and epidemiological information. The expected result is Negative.  Fact Sheet for Patients:  BloggerCourse.com  Fact Sheet for Healthcare Providers:   SeriousBroker.it  This test is no                          t yet approved or cleared by the [place]  FDA and  has been authorized for detection and/or diagnosis of SARS-CoV-2 by FDA under an Emergency Use Authorization (EUA). This EUA will remain  in effect (meaning this test can be used) for the duration of the COVID-19 declaration under Section 564(b)(1) of the Act, 21 U.S.C.section 360bbb-3(b)(1), unless the authorization is terminated  or revoked sooner.       Influenza A by PCR 07/04/2022 NEGATIVE  NEGATIVE Final   Influenza B by PCR 07/04/2022 NEGATIVE  NEGATIVE Final   Comment: (NOTE) The Xpert Xpress SARS-CoV-2/FLU/RSV plus assay is intended as an aid in the diagnosis of influenza from Nasopharyngeal swab specimens and should not be used as a sole basis for treatment. Nasal washings and aspirates are unacceptable for Xpert Xpress SARS-CoV-2/FLU/RSV testing.  Fact Sheet for Patients: BloggerCourse.com  Fact Sheet for Healthcare Providers: SeriousBroker.it  This test is not yet approved or cleared by the [place] FDA and has been authorized for detection and/or diagnosis of SARS-CoV-2 by FDA under an Emergency Use Authorization (EUA). This EUA will remain in effect (meaning this test can be used) for the duration of the COVID-19 declaration under Section 564(b)(1) of the Act, 21 U.S.C. section 360bbb-3(b)(1), unless the authorization is terminated or revoked.     Resp Syncytial Virus by PCR 07/04/2022 NEGATIVE  NEGATIVE Final   Comment: (NOTE) Fact Sheet for Patients: BloggerCourse.com  Fact Sheet for Healthcare Providers: SeriousBroker.it  This test is not yet approved or cleared by the [place] FDA and has been authorized for detection and/or diagnosis of SARS-CoV-2 by FDA under an Emergency Use Authorization (EUA). This EUA  will remain in effect (meaning this test can be used) for the duration of the COVID-19 declaration under Section 564(b)(1) of the Act, 21 U.S.C. section 360bbb-3(b)(1), unless the authorization is terminated or revoked.  Performed at [facility], [address], [place], [place] [address]    WBC 07/04/2022 10.2  4.0 - 10.5 K/uL Final   RBC 07/04/2022 4.66  4.22 - 5.81 MIL/uL Final   Hemoglobin 07/04/2022 13.8  13.0 - 17.0 g/dL Final   HCT 60/45/4098 41.0  39.0 - 52.0 % Final   MCV 07/04/2022 88.0  80.0 - 100.0 fL Final   MCH 07/04/2022 29.6  26.0 - 34.0 pg Final   MCHC 07/04/2022 33.7  30.0 - 36.0 g/dL Final   RDW 11/91/4782 14.0  11.5 - 15.5 % Final   Platelets 07/04/2022 259  150 - 400 K/uL Final   nRBC 07/04/2022 0.0  0.0 - 0.2 % Final   Neutrophils Relative % 07/04/2022 57  % Final   Neutro Abs 07/04/2022 5.9  1.7 - 7.7 K/uL Final   Lymphocytes Relative 07/04/2022 31  % Final   Lymphs Abs 07/04/2022 3.2  0.7 - 4.0 K/uL Final   Monocytes Relative 07/04/2022 8  % Final   Monocytes Absolute 07/04/2022 0.8  0.1 - 1.0 K/uL Final   Eosinophils Relative 07/04/2022 2  % Final   Eosinophils Absolute 07/04/2022 0.2  0.0 - 0.5 K/uL Final   Basophils Relative 07/04/2022 1  % Final   Basophils Absolute 07/04/2022 0.1  0.0 - 0.1 K/uL Final   Immature Granulocytes 07/04/2022 1  % Final   Abs Immature Granulocytes 07/04/2022 0.11 (H)  0.00 - 0.07 K/uL Final   Performed at [facility], [address] [place], [place] [address]   Sodium 07/04/2022 139  135 - 145 mmol/L Final   Potassium 07/04/2022 3.4 (L)  3.5 - 5.1  mmol/L Final   Chloride 07/04/2022 102  98 - 111 mmol/L Final   CO2 07/04/2022 30  22 - 32 mmol/L Final   Glucose, Bld 07/04/2022 71  70 - 99 mg/dL Final   Glucose reference range applies only to samples taken after fasting for at least 8 hours.   BUN 07/04/2022 12  6 - 20 mg/dL Final   Creatinine, Ser 07/04/2022 0.95  0.61 - 1.24 mg/dL Final   Calcium 16/03/9603  9.0  8.9 - 10.3 mg/dL Final   Total Protein 54/02/8118 6.7  6.5 - 8.1 g/dL Final   Albumin 14/78/2956 4.2  3.5 - 5.0 g/dL Final   AST 21/30/8657 21  15 - 41 U/L Final   ALT 07/04/2022 17  0 - 44 U/L Final   Alkaline Phosphatase 07/04/2022 44  38 - 126 U/L Final   Total Bilirubin 07/04/2022 0.3  0.3 - 1.2 mg/dL Final   GFR, Estimated 07/04/2022 >60  >60 mL/min Final   Comment: (NOTE) Calculated using the CKD-EPI Creatinine Equation (2021)    Anion gap 07/04/2022 7  5 - 15 Final   Performed at [facility], [address] [place], [place] [address]   Hgb A1c MFr Bld 07/04/2022 4.9  4.8 - 5.6 % Final   Comment: (NOTE) Pre diabetes:          5.7%-6.4%  Diabetes:              >6.4%  Glycemic control for   <7.0% adults with diabetes    Mean Plasma Glucose 07/04/2022 93.93  mg/dL Final   Performed at [facility], [address], [place], [place] [address]   POC Amphetamine UR 07/04/2022 None Detected  NONE DETECTED (Cut Off Level 1000 ng/mL) Final   POC Secobarbital (BAR) 07/04/2022 None Detected  NONE DETECTED (Cut Off Level 300 ng/mL) Final   POC Buprenorphine (BUP) 07/04/2022 None Detected  NONE DETECTED (Cut Off Level 10 ng/mL) Final   POC Oxazepam (BZO) 07/04/2022 None Detected  NONE DETECTED (Cut Off Level 300 ng/mL) Final   POC Cocaine UR 07/04/2022 None Detected  NONE DETECTED (Cut Off Level 300 ng/mL) Final   POC Methamphetamine UR 07/04/2022 None Detected  NONE DETECTED (Cut Off Level 1000 ng/mL) Final   POC Morphine 07/04/2022 None Detected  NONE DETECTED (Cut Off Level 300 ng/mL) Final   POC Methadone UR 07/04/2022 None Detected  NONE DETECTED (Cut Off Level 300 ng/mL) Final   POC Oxycodone UR 07/04/2022 None Detected  NONE DETECTED (Cut Off Level 100 ng/mL) Final   POC Marijuana UR 07/04/2022 None Detected  NONE DETECTED (Cut Off Level 50 ng/mL) Final   Lithium Lvl 07/04/2022 0.49 (L)  0.60 - 1.20 mmol/L Final   Performed at [facility], [address], [place], [place] [address]   Valproic Acid Lvl 07/04/2022 52  50.0 - 100.0 ug/mL Final   Performed at [facility], [address] [place], [place] [address]   SARSCOV2ONAVIRUS 2 AG 07/04/2022 NEGATIVE  NEGATIVE Final   Comment: (NOTE) SARS-CoV-2 antigen NOT DETECTED.   Negative results are presumptive.  Negative results do not preclude SARS-CoV-2 infection and should not be used as the sole basis for treatment or other patient management decisions, including infection  control decisions, particularly in the presence of clinical signs and  symptoms consistent with COVID-19, or in those who have been in contact with the virus.  Negative results must be combined with clinical observations, patient  history, and epidemiological information. The expected result is Negative.  Fact Sheet for Patients: https://www.jennings-kim.com/  Fact Sheet for Healthcare Providers: https://alexander-rogers.biz/  This test is not yet approved or cleared by the [place] FDA and  has been authorized for detection and/or diagnosis of SARS-CoV-2 by FDA under an Emergency Use Authorization (EUA).  This EUA will remain in effect (meaning this test can be used) for the duration of  the COV                          ID-19 declaration under Section 564(b)(1) of the Act, 21 U.S.C. section 360bbb-3(b)(1), unless the authorization is terminated or revoked sooner.     Cholesterol 07/04/2022 150  0 - 200 mg/dL Final   Triglycerides 60/45/4098 106  <150 mg/dL Final   HDL 11/91/4782 50  >40 mg/dL Final   Total CHOL/HDL Ratio 07/04/2022 3.0  RATIO Final   VLDL 07/04/2022 21  0 - 40 mg/dL Final   LDL Cholesterol 07/04/2022 79  0 - 99 mg/dL Final   Comment:        Total Cholesterol/HDL:CHD Risk Coronary Heart Disease Risk Table                     Men   Women  1/2 Average Risk   3.4   3.3  Average Risk       5.0   4.4  2 X Average Risk   9.6   7.1  3 X Average Risk  23.4   11.0         Use the calculated Patient Ratio above and the CHD Risk Table to determine the patient's CHD Risk.        ATP III CLASSIFICATION (LDL):  <100     mg/dL   Optimal  956-213  mg/dL   Near or Above                    Optimal  130-159  mg/dL   Borderline  086-578  mg/dL   High  >469     mg/dL   Very High Performed at [facility], [address], [place], [place] [address]    TSH 07/04/2022 5.790 (H)  0.350 - 4.500 uIU/mL Final   Comment: Performed by a 3rd Generation assay with a functional sensitivity of <=0.01 uIU/mL. Performed at [facility], [address] [place], [place] [address]    T3, Free 07/04/2022 3.6  2.0 - 4.4 pg/mL Final   Comment: (NOTE) Performed At: [facility] [address] [place], [place] [id number] Jolene Schimke MD DG:[id number]    Free T4 07/04/2022 0.65  0.61 - 1.12 ng/dL Final   Comment: (NOTE) Biotin ingestion may interfere with free T4 tests. If the results are inconsistent with the TSH level, previous test results, or the clinical presentation, then consider biotin interference. If needed, order repeat testing after stopping biotin. Performed at [facility], [address] [place], [place] [address]   Admission on 06/03/2022, Discharged on 06/07/2022  Component Date Value Ref Range Status   Sodium 06/03/2022 137  135 - 145 mmol/L Final   Potassium 06/03/2022 3.5  3.5 - 5.1 mmol/L Final   Chloride 06/03/2022 107  98 - 111 mmol/L Final   CO2 06/03/2022 22  22 - 32 mmol/L Final   Glucose, Bld 06/03/2022 99  70 - 99 mg/dL Final   Glucose reference range applies  only to samples taken after fasting for at least 8 hours.   BUN 06/03/2022 7  6 - 20 mg/dL Final   Creatinine, Ser 06/03/2022 0.93  0.61 - 1.24 mg/dL Final   Calcium 16/03/9603 9.1  8.9 - 10.3 mg/dL Final   Total Protein 54/02/8118 7.4  6.5 - 8.1 g/dL Final   Albumin 14/78/2956 4.4  3.5 - 5.0 g/dL Final   AST 21/30/8657 19  15 - 41 U/L Final   ALT  06/03/2022 13  0 - 44 U/L Final   Alkaline Phosphatase 06/03/2022 57  38 - 126 U/L Final   Total Bilirubin 06/03/2022 0.9  0.3 - 1.2 mg/dL Final   GFR, Estimated 06/03/2022 >60  >60 mL/min Final   Comment: (NOTE) Calculated using the CKD-EPI Creatinine Equation (2021)    Anion gap 06/03/2022 8  5 - 15 Final   Performed at [facility], [address] [place], [place] [address]   Alcohol, Ethyl (B) 06/03/2022 <10  <10 mg/dL Final   Comment: (NOTE) Lowest detectable limit for serum alcohol is 10 mg/dL.  For medical purposes only. Performed at [facility], [address] [place], [place] [address]    Salicylate Lvl 06/03/2022 <7.0 (L)  7.0 - 30.0 mg/dL Final   Performed at [facility], [address] [place], [place] [address]   Acetaminophen (Tylenol), Serum 06/03/2022 <10 (L)  10 - 30 ug/mL Final   Comment: (NOTE) Therapeutic concentrations vary significantly. A range of 10-30 ug/mL  may be an effective concentration for many patients. However, some  are best treated at concentrations outside of this range. Acetaminophen concentrations >150 ug/mL at 4 hours after ingestion  and >50 ug/mL at 12 hours after ingestion are often associated with  toxic reactions.  Performed at [facility] [facility], [address] [place], [place] [address]    WBC 06/03/2022 8.5  4.0 - 10.5 K/uL Final   RBC 06/03/2022 5.11  4.22 - 5.81 MIL/uL Final   Hemoglobin 06/03/2022 14.8  13.0 - 17.0 g/dL Final   HCT 72/53/6644 43.8  39.0 - 52.0 % Final   MCV 06/03/2022 85.7  80.0 - 100.0 fL Final   MCH 06/03/2022 29.0  26.0 - 34.0 pg Final   MCHC 06/03/2022 33.8  30.0 - 36.0 g/dL Final   RDW 03/47/4259 14.0  11.5 - 15.5 % Final   Platelets 06/03/2022 249  150 - 400 K/uL Final   nRBC 06/03/2022 0.0  0.0 - 0.2 % Final   Performed at [facility], [address] [place], [place] [address]   Opiates 06/03/2022 NONE DETECTED  NONE DETECTED Final   Cocaine  06/03/2022 NONE DETECTED  NONE DETECTED Final   Benzodiazepines 06/03/2022 NONE DETECTED  NONE DETECTED Final   Amphetamines 06/03/2022 POSITIVE (A)  NONE DETECTED Final   Tetrahydrocannabinol 06/03/2022 POSITIVE (A)  NONE DETECTED Final   Barbiturates 06/03/2022 NONE DETECTED  NONE DETECTED Final   Comment: (NOTE) DRUG SCREEN FOR MEDICAL PURPOSES ONLY.  IF CONFIRMATION IS NEEDED FOR ANY PURPOSE, NOTIFY LAB WITHIN 5 DAYS.  LOWEST DETECTABLE LIMITS FOR URINE DRUG SCREEN Drug Class                     Cutoff (ng/mL) Amphetamine and metabolites    1000 Barbiturate and metabolites    200 Benzodiazepine                 200  Opiates and metabolites        300 Cocaine and metabolites        300 THC                            50 Performed at [facility], [address] [place], [place] [address]    SARS Coronavirus 2 by RT PCR 06/03/2022 NEGATIVE  NEGATIVE Final   Comment: (NOTE) SARS-CoV-2 target nucleic acids are NOT DETECTED.  The SARS-CoV-2 RNA is generally detectable in upper respiratory specimens during the acute phase of infection. The lowest concentration of SARS-CoV-2 viral copies this assay can detect is 138 copies/mL. A negative result does not preclude SARS-Cov-2 infection and should not be used as the sole basis for treatment or other patient management decisions. A negative result may occur with  improper specimen collection/handling, submission of specimen other than nasopharyngeal swab, presence of viral mutation(s) within the areas targeted by this assay, and inadequate number of viral copies(<138 copies/mL). A negative result must be combined with clinical observations, patient history, and epidemiological information. The expected result is Negative.  Fact Sheet for Patients:  BloggerCourse.com  Fact Sheet for Healthcare Providers:  SeriousBroker.it  This test is no                          t yet  approved or cleared by the [place] FDA and  has been authorized for detection and/or diagnosis of SARS-CoV-2 by FDA under an Emergency Use Authorization (EUA). This EUA will remain  in effect (meaning this test can be used) for the duration of the COVID-19 declaration under Section 564(b)(1) of the Act, 21 U.S.C.section 360bbb-3(b)(1), unless the authorization is terminated  or revoked sooner.       Influenza A by PCR 06/03/2022 NEGATIVE  NEGATIVE Final   Influenza B by PCR 06/03/2022 NEGATIVE  NEGATIVE Final   Comment: (NOTE) The Xpert Xpress SARS-CoV-2/FLU/RSV plus assay is intended as an aid in the diagnosis of influenza from Nasopharyngeal swab specimens and should not be used as a sole basis for treatment. Nasal washings and aspirates are unacceptable for Xpert Xpress SARS-CoV-2/FLU/RSV testing.  Fact Sheet for Patients: BloggerCourse.com  Fact Sheet for Healthcare Providers: SeriousBroker.it  This test is not yet approved or cleared by the [place] FDA and has been authorized for detection and/or diagnosis of SARS-CoV-2 by FDA under an Emergency Use Authorization (EUA). This EUA will remain in effect (meaning this test can be used) for the duration of the COVID-19 declaration under Section 564(b)(1) of the Act, 21 U.S.C. section 360bbb-3(b)(1), unless the authorization is terminated or revoked.     Resp Syncytial Virus by PCR 06/03/2022 NEGATIVE  NEGATIVE Final   Comment: (NOTE) Fact Sheet for Patients: BloggerCourse.com  Fact Sheet for Healthcare Providers: SeriousBroker.it  This test is not yet approved or cleared by the [place] FDA and has been authorized for detection and/or diagnosis of SARS-CoV-2 by FDA under an Emergency Use Authorization (EUA). This EUA will remain in effect (meaning this test can be used) for the duration of the COVID-19  declaration under Section 564(b)(1) of the Act, 21 U.S.C. section 360bbb-3(b)(1), unless the authorization is terminated or revoked.  Performed at [facility], [address], [place], [place] [address]   Admission on 04/26/2022, Discharged on 04/27/2022  Component Date Value Ref Range Status   WBC 04/26/2022 9.3  4.0 - 10.5 K/uL Final   RBC 04/26/2022 5.25  4.22 - 5.81 MIL/uL Final   Hemoglobin 04/26/2022 15.4  13.0 - 17.0 g/dL Final   HCT 13/08/657811/14/2023 46.4  39.0 - 52.0 % Final   MCV 04/26/2022 88.4  80.0 - 100.0 fL Final   MCH 04/26/2022 29.3  26.0 - 34.0 pg Final   MCHC 04/26/2022 33.2  30.0 - 36.0 g/dL Final   RDW 46/96/295211/14/2023 14.0  11.5 - 15.5 % Final   Platelets 04/26/2022 250  150 - 400 K/uL Final   nRBC 04/26/2022 0.0  0.0 - 0.2 % Final   Neutrophils Relative % 04/26/2022 47  % Final   Neutro Abs 04/26/2022 4.3  1.7 - 7.7 K/uL Final   Lymphocytes Relative 04/26/2022 46  % Final   Lymphs Abs 04/26/2022 4.3 (H)  0.7 - 4.0 K/uL Final   Monocytes Relative 04/26/2022 6  % Final   Monocytes Absolute 04/26/2022 0.5  0.1 - 1.0 K/uL Final   Eosinophils Relative 04/26/2022 1  % Final   Eosinophils Absolute 04/26/2022 0.1  0.0 - 0.5 K/uL Final   Basophils Relative 04/26/2022 0  % Final   Basophils Absolute 04/26/2022 0.0  0.0 - 0.1 K/uL Final   Immature Granulocytes 04/26/2022 0  % Final   Abs Immature Granulocytes 04/26/2022 0.02  0.00 - 0.07 K/uL Final   Performed at [facility], [address] [place], [place] [address]   Sodium 04/26/2022 140  135 - 145 mmol/L Final   Potassium 04/26/2022 3.8  3.5 - 5.1 mmol/L Final   Chloride 04/26/2022 105  98 - 111 mmol/L Final   CO2 04/26/2022 29  22 - 32 mmol/L Final   Glucose, Bld 04/26/2022 86  70 - 99 mg/dL Final   Glucose reference range applies only to samples taken after fasting for at least 8 hours.   BUN 04/26/2022 10  6 - 20 mg/dL Final   Creatinine, Ser 04/26/2022 1.02  0.61 - 1.24 mg/dL Final    Calcium 44/01/027211/14/2023 9.5  8.9 - 10.3 mg/dL Final   Total Protein 53/66/440311/14/2023 7.0  6.5 - 8.1 g/dL Final   Albumin 47/42/595611/14/2023 4.5  3.5 - 5.0 g/dL Final   AST 38/75/643311/14/2023 15  15 - 41 U/L Final   ALT 04/26/2022 14  0 - 44 U/L Final   Alkaline Phosphatase 04/26/2022 48  38 - 126 U/L Final   Total Bilirubin 04/26/2022 0.6  0.3 - 1.2 mg/dL Final   GFR, Estimated 04/26/2022 >60  >60 mL/min Final   Comment: (NOTE) Calculated using the CKD-EPI Creatinine Equation (2021)    Anion gap 04/26/2022 6  5 - 15 Final   Performed at [facility], [address] [place], [place] [address]   Alcohol, Ethyl (B) 04/26/2022 <10  <10 mg/dL Final   Comment: (NOTE) Lowest detectable limit for serum alcohol is 10 mg/dL.  For medical purposes only. Performed at [facility], [address] [place], [place] [address]    Opiates 04/26/2022 NONE DETECTED  NONE DETECTED Final   Cocaine 04/26/2022 NONE DETECTED  NONE DETECTED Final   Benzodiazepines 04/26/2022 NONE DETECTED  NONE DETECTED Final   Amphetamines 04/26/2022 POSITIVE (A)  NONE DETECTED Final   Tetrahydrocannabinol 04/26/2022 POSITIVE (A)  NONE DETECTED Final   Barbiturates 04/26/2022 NONE DETECTED  NONE DETECTED Final   Comment: (NOTE) DRUG SCREEN FOR MEDICAL PURPOSES ONLY.  IF CONFIRMATION IS NEEDED FOR ANY PURPOSE, NOTIFY LAB WITHIN 5 DAYS.  LOWEST DETECTABLE LIMITS FOR URINE DRUG SCREEN Drug Class                     Cutoff (ng/mL) Amphetamine and metabolites    1000 Barbiturate and metabolites    200 Benzodiazepine                 200 Opiates and metabolites        300 Cocaine and metabolites        300 THC                            50 Performed at [facility], [address], [place], [place] [address]     Blood Alcohol level:  Lab Results  Component Value Date   ETH <10 09/16/2022   ETH <10 08/18/2022    Metabolic Disorder Labs: Lab Results  Component Value Date    HGBA1C 4.9 07/04/2022   MPG 93.93 07/04/2022   MPG 93.93 11/10/2021   No results found for: "PROLACTIN" Lab Results  Component Value Date   CHOL 150 07/04/2022   TRIG 106 07/04/2022   HDL 50 07/04/2022   CHOLHDL 3.0 07/04/2022   VLDL 21 07/04/2022   LDLCALC 79 07/04/2022   LDLCALC 55 11/10/2021    Therapeutic Lab Levels: Lab Results  Component Value Date   LITHIUM <0.06 (L) 09/17/2022   LITHIUM <0.06 (L) 07/21/2022   Lab Results  Component Value Date   VALPROATE <10 (L) 09/16/2022   VALPROATE 55 08/15/2022   No results found for: "CBMZ"  Physical Findings   AIMS    Flowsheet Row [facility]) [facility]  AIMS Total Score 0 0      AUDIT    Flowsheet Row [facility]) [facility] Ad[facility] ([facility]  Alcohol Use Disorder Identification Test Final Score (AUDIT) 0 0      PHQ2-9    Flowsheet Row ED from 11/05/2021 in [facility] ED from 11/04/2021 in [facility] Department at [facility] ED from 11/03/2021 in [facility] Emergency Department at [facility]  PHQ-2 Total Score 4 6 6   PHQ-9 Total Score 25 24 25       Flowsheet Row ED from 09/17/2022 in [facility] ED from 09/16/2022 in [facility] Department at [facility] ED from 08/18/2022 in [facility] Emergency Department at [facility]  C-SSRS RISK CATEGORY High Risk High Risk High Risk        Musculoskeletal  Strength & Muscle Tone: within normal limits Gait & Station: normal Patient leans: N/A  Psychiatric Specialty Exam  Presentation  General Appearance:  Appropriate for Environment  Eye Contact: Fair  Speech: Clear and  Coherent; Normal Rate  Speech Volume: Normal  Handedness: Right   Mood and Affect  Mood: Depressed  Affect: Flat   Thought Process  Thought Processes: Coherent; Goal Directed; Linear  Descriptions of Associations:Intact  Orientation:Full (Time, Place and Person)  Thought Content:Logical  Diagnosis of Schizophrenia or Schizoaffective disorder in past: Yes (Pt reports that he has been told that he has diagnosis of schizoffective d/o)  Duration of Psychotic Symptoms: Greater than six months   Hallucinations:Hallucinations: None Description of Auditory Hallucinations: "voices  telling me to hurt myself" Description of Visual Hallucinations: "Visons of things"  Ideas of Reference:None  Suicidal Thoughts:Suicidal Thoughts: Yes, Passive SI Active Intent and/or Plan: With Plan SI Passive Intent and/or Plan: Without Intent; Without Plan  Homicidal Thoughts:Homicidal Thoughts: No   Sensorium  Memory: Immediate Good  Judgment: Fair  Insight: Fair   Chartered certified accountant: Fair  Attention Span: Fair  Recall: [employer] of Knowledge: Fair  Language: Fair   Psychomotor Activity  Psychomotor Activity: Psychomotor Activity: Normal   Assets  Assets: Manufacturing systems engineer; Desire for Improvement; Financial Resources/Insurance; Resilience   Sleep  Sleep: Sleep: Poor Number of Hours of Sleep: 4   Nutritional Assessment (For OBS and [facility] admissions only) Has the patient had a weight loss or gain of 10 pounds or more in the last 3 months?: No Has the patient had a decrease in food intake/or appetite?: No Does the patient have dental problems?: No Does the patient have eating habits or behaviors that may be indicators of an eating disorder including binging or inducing vomiting?: No Has the patient recently lost weight without trying?: 0 Has the patient been eating poorly because of a decreased appetite?: 0 Malnutrition Screening Tool  Score: 0    Physical Exam  Physical Exam Constitutional:      General: He is not in acute distress.    Appearance: He is not ill-appearing, toxic-appearing or diaphoretic.  Eyes:     General: No scleral icterus. Cardiovascular:     Rate and Rhythm: Normal rate.  Pulmonary:     Effort: Pulmonary effort is normal. No respiratory distress.  Neurological:     Mental Status: He is alert and oriented to person, place, and time.  Psychiatric:        Attention and Perception: Attention and perception normal.        Mood and Affect: Mood is depressed. Affect is flat.        Speech: Speech normal.        Behavior: Behavior normal. Behavior is cooperative.        Thought Content: Thought content includes suicidal ideation. Thought content does not include suicidal plan.        Cognition and Memory: Cognition and memory normal.    Review of Systems  Constitutional:  Negative for chills and fever.  Respiratory:  Negative for shortness of breath.   Cardiovascular:  Negative for chest pain and palpitations.  Gastrointestinal:  Negative for abdominal pain.  Neurological:  Negative for headaches.  Psychiatric/Behavioral:  Positive for depression and suicidal ideas.    Blood pressure 113/65, pulse 96, temperature 97.9 F (36.6 C), temperature source Oral, resp. rate 20, SpO2 94 %. There is no height or weight on file to calculate BMI.  Treatment Plan Summary: Plan Likely discharge to [facility] residential tomorrow morning. Per pt, needs to arrive before 1PM.  Lauree Chandler, NP 09/18/2022 12:02 PM

## 2022-09-18 NOTE — ED Notes (Addendum)
Pt awake with c/o toothache/pain- 8/10. He is pacing on the unit. States he completed ABT for toothache a week ago and it is intense. He states he is having passive SI but he contracts for safety. Denies c/o withdrawal symptoms. Will notify provider of toothache

## 2022-09-19 DIAGNOSIS — F32A Depression, unspecified: Secondary | ICD-10-CM | POA: Diagnosis present

## 2022-09-19 DIAGNOSIS — R440 Auditory hallucinations: Secondary | ICD-10-CM | POA: Diagnosis not present

## 2022-09-19 DIAGNOSIS — Z9151 Personal history of suicidal behavior: Secondary | ICD-10-CM | POA: Diagnosis not present

## 2022-09-19 DIAGNOSIS — R45851 Suicidal ideations: Secondary | ICD-10-CM | POA: Diagnosis not present

## 2022-09-19 MED ORDER — BENZOCAINE 10 % MT GEL
1.0000 | Freq: Once | OROMUCOSAL | Status: AC
  Administered 2022-09-19: 1 via OROMUCOSAL
  Filled 2022-09-19: qty 9

## 2022-09-19 NOTE — ED Notes (Signed)
Patient observed/assessed in room in bed appearing in no immediate distress resting peacefully. Q15 minute checks continued by MHT and nursing staff. Will continue to monitor and support. 

## 2022-09-19 NOTE — ED Notes (Signed)
Called report Nicolette Bang, RN at [facility].

## 2022-09-19 NOTE — ED Notes (Addendum)
Pt approached nurses station requesting Tylenol for toothache 7/10. Tylenol given. Environment secured. Will continue to monitor for safety.

## 2022-09-19 NOTE — ED Provider Notes (Signed)
Behavioral Health Progress Note  Date and Time: 09/19/2022 9:32 AM Name: Jon Clayton MRN:  [id number]  HPI: Patient initially presented to [facility] emergency department.  He was recommended for continuous assessment and was transferred to [facility] for overnight observation.  Jon Clayton, 27 y.o., male patient seen face to face by this provider, consulted with Dr. Lucianne Muss; and chart reviewed on 09/19/22.   Subjective:   Patient has had multiple ED visits and inpatient psychiatric hospitalizations.  Per note from previous NP Darrick Grinder, "He reports prior to being seen at [facility] he was seen at [facility] where he states he was discharged "after 4 hours". Per note from [facility], pt was brought in by police with IVC paperwork from [facility] after reporting SI/HI and hallucinations. Pt reports he is a sex offender and is currently on probation. He reports he is homeless and unemployed."  On today's assessment Jon Clayton is observed laying in his bed asleep.  He is easily awakened.  He is disheveled and makes fair eye contact.  He has normal speech and behavior.  He is alert/oriented x 4, calm, and cooperative.  He reports his mood has improved.  He denies any concerns with appetite or sleep.  He is tolerating medications without any adverse reactions.  He is denying any SI/HI/AVH.  He verbally contracts for safety.  States he is feeling more hopeful today due to being able to possibly go to [facility].  He does not appear to be responding to any internal/external stimuli.  He does not appear psychotic or manic.   He is requesting to be transferred to [facility] residential.  States he was told that he could present today after 1 PM.  However social worker contacted [employer] and patient is eligible to reapply as of today.  A referral will be sent on patient's behalf.  Patient states that he keeps presenting to the emergency department seeking help and feels as though he is dismissed.  He is denying any withdrawal symptoms.   He has a past history of methamphetamine, alcohol, and marijuana use.    Diagnosis:  Final diagnoses:  Depression, unspecified depression type    Total Time spent with patient: 30 minutes  Past Psychiatric History: As documented in H&P Past Medical History: As documented in H&P Family History: As documented in H&P Family Psychiatric  History: As documented in H&P Social History: As documented in H&P  Additional Social History:     As documented in H&P      Sleep: Good  Appetite:  Good  Current Medications:  Current Facility-Administered Medications  Medication Dose Route Frequency Provider Last Rate Last Admin   acetaminophen (TYLENOL) tablet 650 mg  650 mg Oral Q6H PRN Ajibola, Ene A, NP   650 mg at 09/18/22 2116   alum & mag hydroxide-simeth (MAALOX/MYLANTA) 200-200-20 MG/5ML suspension 30 mL  30 mL Oral Q4H PRN Ajibola, Ene A, NP       hydrOXYzine (ATARAX) tablet 25 mg  25 mg Oral TID PRN Ajibola, Ene A, NP   25 mg at 09/18/22 2116   magnesium hydroxide (MILK OF MAGNESIA) suspension 30 mL  30 mL Oral Daily PRN Ajibola, Ene A, NP       OLANZapine (ZYPREXA) tablet 15 mg  15 mg Oral QHS Ajibola, Ene A, NP   15 mg at 09/18/22 2116   OLANZapine (ZYPREXA) tablet 5 mg  5 mg Oral q AM Ajibola, Ene A, NP   5 mg at 09/19/22 [phone]  prazosin (MINIPRESS) capsule 2 mg  2 mg Oral QHS Ajibola, Ene A, NP   2 mg at 09/18/22 2116   traZODone (DESYREL) tablet 50 mg  50 mg Oral QHS PRN Ajibola, Ene A, NP   50 mg at 09/18/22 2115   Current Outpatient Medications  Medication Sig Dispense Refill   acetaminophen (TYLENOL) 500 MG tablet Take 1,000 mg by mouth every 6 (six) hours as needed for mild pain or headache.     albuterol (VENTOLIN HFA) 108 (90 Base) MCG/ACT inhaler Inhale 2 puffs into the lungs every 6 (six) hours as needed for wheezing or shortness of breath. 6.7 g 0   divalproex (DEPAKOTE) 500 MG DR tablet Take 500 mg by mouth 2 (two) times daily.     hydrOXYzine (VISTARIL) 25 MG  capsule Take 25 mg by mouth every 6 (six) hours as needed for anxiety.     lithium carbonate (LITHOBID) 300 MG ER tablet Take 300 mg by mouth 3 (three) times daily.     OLANZapine (ZYPREXA) 15 MG tablet Take 15 mg by mouth at bedtime.     OLANZapine (ZYPREXA) 5 MG tablet Take 5 mg by mouth in the morning.     prazosin (MINIPRESS) 2 MG capsule Take 2 mg by mouth at bedtime.     QUEtiapine (SEROQUEL) 100 MG tablet Take 100 mg by mouth at bedtime.     QUEtiapine (SEROQUEL) 200 MG tablet Take 200 mg by mouth every 8 (eight) hours.     Vitamin D, Ergocalciferol, (DRISDOL) 1.25 MG (50000 UNIT) CAPS capsule Take 1 capsule (50,000 Units total) by mouth every 7 (seven) days. (Patient not taking: Reported on 09/16/2022) 4 capsule 2    Labs  Lab Results:  Admission on 09/16/2022, Discharged on 09/17/2022  Component Date Value Ref Range Status   Opiates 09/16/2022 NONE DETECTED  NONE DETECTED Final   Cocaine 09/16/2022 NONE DETECTED  NONE DETECTED Final   Benzodiazepines 09/16/2022 NONE DETECTED  NONE DETECTED Final   Amphetamines 09/16/2022 NONE DETECTED  NONE DETECTED Final   Tetrahydrocannabinol 09/16/2022 NONE DETECTED  NONE DETECTED Final   Barbiturates 09/16/2022 NONE DETECTED  NONE DETECTED Final   Comment: (NOTE) DRUG SCREEN FOR MEDICAL PURPOSES ONLY.  IF CONFIRMATION IS NEEDED FOR ANY PURPOSE, NOTIFY LAB WITHIN 5 DAYS.  LOWEST DETECTABLE LIMITS FOR URINE DRUG SCREEN Drug Class                     Cutoff (ng/mL) Amphetamine and metabolites    1000 Barbiturate and metabolites    200 Benzodiazepine                 200 Opiates and metabolites        300 Cocaine and metabolites        300 THC                            50 Performed at [facility], [address] [place], [place] [address]    Sodium 09/16/2022 137  135 - 145 mmol/L Final   Potassium 09/16/2022 3.9  3.5 - 5.1 mmol/L Final   Chloride 09/16/2022 104  98 - 111 mmol/L Final   CO2 09/16/2022 26  22 - 32  mmol/L Final   Glucose, Bld 09/16/2022 80  70 - 99 mg/dL Final   Glucose reference range applies only to samples taken after fasting for at least 8 hours.   BUN 09/16/2022  18  6 - 20 mg/dL Final   Creatinine, Ser 09/16/2022 0.87  0.61 - 1.24 mg/dL Final   Calcium 16/03/9603 8.6 (L)  8.9 - 10.3 mg/dL Final   Total Protein 54/02/8118 6.8  6.5 - 8.1 g/dL Final   Albumin 14/78/2956 4.2  3.5 - 5.0 g/dL Final   AST 21/30/8657 34  15 - 41 U/L Final   ALT 09/16/2022 36  0 - 44 U/L Final   Alkaline Phosphatase 09/16/2022 42  38 - 126 U/L Final   Total Bilirubin 09/16/2022 0.5  0.3 - 1.2 mg/dL Final   GFR, Estimated 09/16/2022 >60  >60 mL/min Final   Comment: (NOTE) Calculated using the CKD-EPI Creatinine Equation (2021)    Anion gap 09/16/2022 7  5 - 15 Final   Performed at [facility], [address], [place], [place] [address]   Alcohol, Ethyl (B) 09/16/2022 <10  <10 mg/dL Final   Comment: (NOTE) Lowest detectable limit for serum alcohol is 10 mg/dL.  For medical purposes only. Performed at [facility], [address], [place], [place] [address]    WBC 09/16/2022 7.4  4.0 - 10.5 K/uL Final   RBC 09/16/2022 4.89  4.22 - 5.81 MIL/uL Final   Hemoglobin 09/16/2022 14.5  13.0 - 17.0 g/dL Final   HCT 41/32/4401 42.6  39.0 - 52.0 % Final   MCV 09/16/2022 87.1  80.0 - 100.0 fL Final   MCH 09/16/2022 29.7  26.0 - 34.0 pg Final   MCHC 09/16/2022 34.0  30.0 - 36.0 g/dL Final   RDW 02/72/5366 13.2  11.5 - 15.5 % Final   Platelets 09/16/2022 229  150 - 400 K/uL Final   nRBC 09/16/2022 0.0  0.0 - 0.2 % Final   Neutrophils Relative % 09/16/2022 59  % Final   Neutro Abs 09/16/2022 4.3  1.7 - 7.7 K/uL Final   Lymphocytes Relative 09/16/2022 33  % Final   Lymphs Abs 09/16/2022 2.5  0.7 - 4.0 K/uL Final   Monocytes Relative 09/16/2022 7  % Final   Monocytes Absolute 09/16/2022 0.5  0.1 - 1.0 K/uL Final   Eosinophils Relative 09/16/2022 1  % Final   Eosinophils  Absolute 09/16/2022 0.1  0.0 - 0.5 K/uL Final   Basophils Relative 09/16/2022 0  % Final   Basophils Absolute 09/16/2022 0.0  0.0 - 0.1 K/uL Final   Immature Granulocytes 09/16/2022 0  % Final   Abs Immature Granulocytes 09/16/2022 0.03  0.00 - 0.07 K/uL Final   Performed at [facility], [address] [place], [place] [address]   Total CK 09/16/2022 498 (H)  49 - 397 U/L Final   Performed at [facility], [address], [place], [place] [address]   Acetaminophen (Tylenol), Serum 09/16/2022 <10 (L)  10 - 30 ug/mL Final   Comment: (NOTE) Therapeutic concentrations vary significantly. A range of 10-30 ug/mL  may be an effective concentration for many patients. However, some  are best treated at concentrations outside of this range. Acetaminophen concentrations >150 ug/mL at 4 hours after ingestion  and >50 ug/mL at 12 hours after ingestion are often associated with  toxic reactions.  Performed at [facility], [address] [place], [place] [address]    Salicylate Lvl 09/16/2022 <7.0 (L)  7.0 - 30.0 mg/dL Final   Performed at [facility], [address], [place], [place] [address]   Valproic Acid Lvl 09/16/2022 <10 (L)  50.0 -  100.0 ug/mL Final   Comment: RESULT CONFIRMED BY MANUAL DILUTION Performed at [facility], [address] [place], [place] [address]    SARS Coronavirus 2 by RT PCR 09/16/2022 NEGATIVE  NEGATIVE Final   Comment: (NOTE) SARS-CoV-2 target nucleic acids are NOT DETECTED.  The SARS-CoV-2 RNA is generally detectable in upper and lower respiratory specimens during the acute phase of infection. The lowest concentration of SARS-CoV-2 viral copies this assay can detect is 250 copies / mL. A negative result does not preclude SARS-CoV-2 infection and should not be used as the sole basis for treatment or other patient management decisions.  A negative result may occur  with improper specimen collection / handling, submission of specimen other than nasopharyngeal swab, presence of viral mutation(s) within the areas targeted by this assay, and inadequate number of viral copies (<250 copies / mL). A negative result must be combined with clinical observations, patient history, and epidemiological information.  Fact Sheet for Patients:   RoadLapTop.co.za  Fact Sheet for Healthcare Providers: http://kim-Hausner.com/  This test is not yet approved or                           cleared by the [place] FDA and has been authorized for detection and/or diagnosis of SARS-CoV-2 by FDA under an Emergency Use Authorization (EUA).  This EUA will remain in effect (meaning this test can be used) for the duration of the COVID-19 declaration under Section 564(b)(1) of the Act, 21 U.S.C. section 360bbb-3(b)(1), unless the authorization is terminated or revoked sooner.  Performed at [facility], [address], [place], [place] [address]    Lithium Lvl 09/17/2022 <0.06 (L)  0.60 - 1.20 mmol/L Final   Performed at [facility], [address], [place], [place] [address]  Admission on 08/18/2022, Discharged on 08/20/2022  Component Date Value Ref Range Status   Sodium 08/18/2022 139  135 - 145 mmol/L Final   Potassium 08/18/2022 3.9  3.5 - 5.1 mmol/L Final   Chloride 08/18/2022 106  98 - 111 mmol/L Final   CO2 08/18/2022 24  22 - 32 mmol/L Final   Glucose, Bld 08/18/2022 98  70 - 99 mg/dL Final   Glucose reference range applies only to samples taken after fasting for at least 8 hours.   BUN 08/18/2022 13  6 - 20 mg/dL Final   Creatinine, Ser 08/18/2022 0.91  0.61 - 1.24 mg/dL Final   Calcium 91/47/8295 8.9  8.9 - 10.3 mg/dL Final   Total Protein 62/13/0865 7.0  6.5 - 8.1 g/dL Final   Albumin 78/46/9629 4.2  3.5 - 5.0 g/dL Final   AST 52/84/1324 19  15 - 41 U/L Final   ALT  08/18/2022 15  0 - 44 U/L Final   Alkaline Phosphatase 08/18/2022 44  38 - 126 U/L Final   Total Bilirubin 08/18/2022 0.6  0.3 - 1.2 mg/dL Final   GFR, Estimated 08/18/2022 >60  >60 mL/min Final   Comment: (NOTE) Calculated using the CKD-EPI Creatinine Equation (2021)    Anion gap 08/18/2022 9  5 - 15 Final   Performed at [facility], [address] [place], [place] [address]   Alcohol, Ethyl (B) 08/18/2022 <10  <10 mg/dL Final   Comment: (NOTE) Lowest detectable limit for serum alcohol is 10 mg/dL.  For medical purposes only. Performed at [facility], [address], [place], [place] [address]  Salicylate Lvl 08/18/2022 <7.0 (L)  7.0 - 30.0 mg/dL Final   Performed at [facility], [address] [place], [place] [address]   Acetaminophen (Tylenol), Serum 08/18/2022 <10 (L)  10 - 30 ug/mL Final   Comment: (NOTE) Therapeutic concentrations vary significantly. A range of 10-30 ug/mL  may be an effective concentration for many patients. However, some  are best treated at concentrations outside of this range. Acetaminophen concentrations >150 ug/mL at 4 hours after ingestion  and >50 ug/mL at 12 hours after ingestion are often associated with  toxic reactions.  Performed at [facility], [address] [place], [place] [address]    WBC 08/18/2022 5.7  4.0 - 10.5 K/uL Final   RBC 08/18/2022 4.87  4.22 - 5.81 MIL/uL Final   Hemoglobin 08/18/2022 14.4  13.0 - 17.0 g/dL Final   HCT 56/21/3086 43.7  39.0 - 52.0 % Final   MCV 08/18/2022 89.7  80.0 - 100.0 fL Final   MCH 08/18/2022 29.6  26.0 - 34.0 pg Final   MCHC 08/18/2022 33.0  30.0 - 36.0 g/dL Final   RDW 57/84/6962 13.2  11.5 - 15.5 % Final   Platelets 08/18/2022 190  150 - 400 K/uL Final   nRBC 08/18/2022 0.0  0.0 - 0.2 % Final   Performed at [facility], [address] [place], [place] [address]   Glucose-Capillary 08/19/2022 76  70 - 99 mg/dL Final   Glucose  reference range applies only to samples taken after fasting for at least 8 hours.   Acetaminophen (Tylenol), Serum 08/18/2022 <10 (L)  10 - 30 ug/mL Final   Comment: (NOTE) Therapeutic concentrations vary significantly. A range of 10-30 ug/mL  may be an effective concentration for many patients. However, some  are best treated at concentrations outside of this range. Acetaminophen concentrations >150 ug/mL at 4 hours after ingestion  and >50 ug/mL at 12 hours after ingestion are often associated with  toxic reactions.  Performed at [facility], [address] [place], [place] [address]    Sodium 08/18/2022 138  135 - 145 mmol/L Final   Potassium 08/18/2022 4.2  3.5 - 5.1 mmol/L Final   Chloride 08/18/2022 106  98 - 111 mmol/L Final   CO2 08/18/2022 26  22 - 32 mmol/L Final   Glucose, Bld 08/18/2022 85  70 - 99 mg/dL Final   Glucose reference range applies only to samples taken after fasting for at least 8 hours.   BUN 08/18/2022 14  6 - 20 mg/dL Final   Creatinine, Ser 08/18/2022 0.90  0.61 - 1.24 mg/dL Final   Calcium 06/15/7251 8.7 (L)  8.9 - 10.3 mg/dL Final   Total Protein 66/44/0347 6.6  6.5 - 8.1 g/dL Final   Albumin 42/59/5638 3.8  3.5 - 5.0 g/dL Final   AST 75/64/3329 16  15 - 41 U/L Final   ALT 08/18/2022 16  0 - 44 U/L Final   Alkaline Phosphatase 08/18/2022 42  38 - 126 U/L Final   Total Bilirubin 08/18/2022 0.7  0.3 - 1.2 mg/dL Final   GFR, Estimated 08/18/2022 >60  >60 mL/min Final   Comment: (NOTE) Calculated using the CKD-EPI Creatinine Equation (2021)    Anion gap 08/18/2022 6  5 - 15 Final   Performed at [facility], [address] [place], [place] [address]   Glucose-Capillary 08/19/2022 82  70 - 99 mg/dL Final   Glucose  reference range applies only to samples taken after fasting for at least 8 hours.   SARS Coronavirus 2 by RT PCR 08/19/2022 NEGATIVE  NEGATIVE Final   Comment: (NOTE) SARS-CoV-2 target nucleic acids are NOT  DETECTED.  The SARS-CoV-2 RNA is generally detectable in upper respiratory specimens during the acute phase of infection. The lowest concentration of SARS-CoV-2 viral copies this assay can detect is 138 copies/mL. A negative result does not preclude SARS-Cov-2 infection and should not be used as the sole basis for treatment or other patient management decisions. A negative result may occur with  improper specimen collection/handling, submission of specimen other than nasopharyngeal swab, presence of viral mutation(s) within the areas targeted by this assay, and inadequate number of viral copies(<138 copies/mL). A negative result must be combined with clinical observations, patient history, and epidemiological information. The expected result is Negative.  Fact Sheet for Patients:  BloggerCourse.com  Fact Sheet for Healthcare Providers:  SeriousBroker.it  This test is no                          t yet approved or cleared by the [place] FDA and  has been authorized for detection and/or diagnosis of SARS-CoV-2 by FDA under an Emergency Use Authorization (EUA). This EUA will remain  in effect (meaning this test can be used) for the duration of the COVID-19 declaration under Section 564(b)(1) of the Act, 21 U.S.C.section 360bbb-3(b)(1), unless the authorization is terminated  or revoked sooner.       Influenza A by PCR 08/19/2022 NEGATIVE  NEGATIVE Final   Influenza B by PCR 08/19/2022 NEGATIVE  NEGATIVE Final   Comment: (NOTE) The Xpert Xpress SARS-CoV-2/FLU/RSV plus assay is intended as an aid in the diagnosis of influenza from Nasopharyngeal swab specimens and should not be used as a sole basis for treatment. Nasal washings and aspirates are unacceptable for Xpert Xpress SARS-CoV-2/FLU/RSV testing.  Fact Sheet for Patients: BloggerCourse.com  Fact Sheet for Healthcare  Providers: SeriousBroker.it  This test is not yet approved or cleared by the [place] FDA and has been authorized for detection and/or diagnosis of SARS-CoV-2 by FDA under an Emergency Use Authorization (EUA). This EUA will remain in effect (meaning this test can be used) for the duration of the COVID-19 declaration under Section 564(b)(1) of the Act, 21 U.S.C. section 360bbb-3(b)(1), unless the authorization is terminated or revoked.     Resp Syncytial Virus by PCR 08/19/2022 NEGATIVE  NEGATIVE Final   Comment: (NOTE) Fact Sheet for Patients: BloggerCourse.com  Fact Sheet for Healthcare Providers: SeriousBroker.it  This test is not yet approved or cleared by the [place] FDA and has been authorized for detection and/or diagnosis of SARS-CoV-2 by FDA under an Emergency Use Authorization (EUA). This EUA will remain in effect (meaning this test can be used) for the duration of the COVID-19 declaration under Section 564(b)(1) of the Act, 21 U.S.C. section 360bbb-3(b)(1), unless the authorization is terminated or revoked.  Performed at [facility], [address] [place], [place] [address]    Color, Urine 08/19/2022 YELLOW  YELLOW Final   APPearance 08/19/2022 CLEAR  CLEAR Final   Specific Gravity, Urine 08/19/2022 1.025  1.005 - 1.030 Final   pH 08/19/2022 6.0  5.0 - 8.0 Final   Glucose, UA 08/19/2022 NEGATIVE  NEGATIVE mg/dL Final   Hgb urine dipstick 08/19/2022 NEGATIVE  NEGATIVE Final   Bilirubin Urine 08/19/2022 NEGATIVE  NEGATIVE Final   Ketones, ur  08/19/2022 NEGATIVE  NEGATIVE mg/dL Final   Protein, ur 16/03/9603 NEGATIVE  NEGATIVE mg/dL Final   Nitrite 54/02/8118 NEGATIVE  NEGATIVE Final   Leukocytes,Ua 08/19/2022 NEGATIVE  NEGATIVE Final   Comment: Microscopic not done on urines with negative protein, blood, leukocytes, nitrite, or glucose < 500 mg/dL. Performed at [facility] [address] [place], [place] [address]   Admission on 08/18/2022, Discharged on 08/18/2022  Component Date Value Ref Range Status   Sodium 08/18/2022 137  135 - 145 mmol/L Final   Potassium 08/18/2022 3.7  3.5 - 5.1 mmol/L Final   Chloride 08/18/2022 107  98 - 111 mmol/L Final   CO2 08/18/2022 22  22 - 32 mmol/L Final   Glucose, Bld 08/18/2022 122 (H)  70 - 99 mg/dL Final   Glucose reference range applies only to samples taken after fasting for at least 8 hours.   BUN 08/18/2022 13  6 - 20 mg/dL Final   Creatinine, Ser 08/18/2022 0.94  0.61 - 1.24 mg/dL Final   Calcium 95/62/1308 8.3 (L)  8.9 - 10.3 mg/dL Final   Total Protein 65/78/4696 6.6  6.5 - 8.1 g/dL Final   Albumin 29/52/8413 3.8  3.5 - 5.0 g/dL Final   AST 24/40/1027 28  15 - 41 U/L Final   ALT 08/18/2022 12  0 - 44 U/L Final   Alkaline Phosphatase 08/18/2022 43  38 - 126 U/L Final   Total Bilirubin 08/18/2022 0.4  0.3 - 1.2 mg/dL Final   GFR, Estimated 08/18/2022 >60  >60 mL/min Final   Comment: (NOTE) Calculated using the CKD-EPI Creatinine Equation (2021)    Anion gap 08/18/2022 8  5 - 15 Final   Performed at [facility], [address] [place], [place] [address]   Alcohol, Ethyl (B) 08/18/2022 <10  <10 mg/dL Final   Comment: (NOTE) Lowest detectable limit for serum alcohol is 10 mg/dL.  For medical purposes only. Performed at [facility], [address] [place], [place] [address]    Salicylate Lvl 08/18/2022 <7.0 (L)  7.0 - 30.0 mg/dL Final   Performed at [facility], [address] [place], [place] [address]   Acetaminophen (Tylenol), Serum 08/18/2022 <10 (L)  10 - 30 ug/mL Final   Comment: (NOTE) Therapeutic concentrations vary significantly. A range of 10-30 ug/mL  may be an effective concentration for many patients. However, some  are best treated at concentrations outside of this range. Acetaminophen concentrations >150 ug/mL at 4 hours after  ingestion  and >50 ug/mL at 12 hours after ingestion are often associated with  toxic reactions.  Performed at [facility], [address] [place], [place] [address]    WBC 08/18/2022 5.8  4.0 - 10.5 K/uL Final   RBC 08/18/2022 4.71  4.22 - 5.81 MIL/uL Final   Hemoglobin 08/18/2022 13.9  13.0 - 17.0 g/dL Final   HCT 56/43/3295 42.6  39.0 - 52.0 % Final   MCV 08/18/2022 90.4  80.0 - 100.0 fL Final   MCH 08/18/2022 29.5  26.0 - 34.0 pg Final   MCHC 08/18/2022 32.6  30.0 - 36.0 g/dL Final   RDW 18/84/1660 13.1  11.5 - 15.5 % Final   Platelets 08/18/2022 183  150 - 400 K/uL Final   nRBC 08/18/2022 0.0  0.0 - 0.2 % Final   Performed at [facility], [address] [place], [place] [address]   Opiates  08/18/2022 NONE DETECTED  NONE DETECTED Final   Cocaine 08/18/2022 NONE DETECTED  NONE DETECTED Final   Benzodiazepines 08/18/2022 NONE DETECTED  NONE DETECTED Final   Amphetamines 08/18/2022 NONE DETECTED  NONE DETECTED Final   Tetrahydrocannabinol 08/18/2022 NONE DETECTED  NONE DETECTED Final   Barbiturates 08/18/2022 NONE DETECTED  NONE DETECTED Final   Comment: (NOTE) DRUG SCREEN FOR MEDICAL PURPOSES ONLY.  IF CONFIRMATION IS NEEDED FOR ANY PURPOSE, NOTIFY LAB WITHIN 5 DAYS.  LOWEST DETECTABLE LIMITS FOR URINE DRUG SCREEN Drug Class                     Cutoff (ng/mL) Amphetamine and metabolites    1000 Barbiturate and metabolites    200 Benzodiazepine                 200 Opiates and metabolites        300 Cocaine and metabolites        300 THC                            50 Performed at [facility], [address] [place], [place] [address]   Office Visit on 08/15/2022  Component Date Value Ref Range Status   Valproic Acid Lvl 08/15/2022 55  50 - 100 ug/mL Final   Comment:                                 Detection Limit = 4                            <4 indicates None Detected Toxicity may occur at levels of 100-500. Measurements of free  unbound valproic acid may improve the assess- ment of clinical response.    Glucose 08/15/2022 107 (H)  70 - 99 mg/dL Final   BUN 19/14/7829 10  6 - 20 mg/dL Final   Creatinine, Ser 08/15/2022 0.89  0.76 - 1.27 mg/dL Final   eGFR 56/21/3086 123  >59 mL/min/1.73 Final   BUN/Creatinine Ratio 08/15/2022 11  9 - 20 Final   Sodium 08/15/2022 142  134 - 144 mmol/L Final   Potassium 08/15/2022 4.3  3.5 - 5.2 mmol/L Final   Chloride 08/15/2022 102  96 - 106 mmol/L Final   CO2 08/15/2022 22  20 - 29 mmol/L Final   Calcium 08/15/2022 9.6  8.7 - 10.2 mg/dL Final   Vit D, 57-QIONGEX 08/15/2022 13.7 (L)  30.0 - 100.0 ng/mL Final   Comment: Vitamin D deficiency has been defined by the Institute of Medicine and an Endocrine Society practice guideline as a level of serum 25-OH vitamin D less than 20 ng/mL (1,2). The Endocrine Society went on to further define vitamin D insufficiency as a level between 21 and 29 ng/mL (2). 1. IOM (Institute of Medicine). 2010. Dietary reference    intakes for calcium and D. [place]: The    [employer]. 2. Holick MF, [place], Bischoff-Ferrari HA, et al.    Evaluation, treatment, and prevention of vitamin D    deficiency: an Endocrine Society clinical practice    guideline. JCEM. 2011 Jul; 96(7):1911-30.    TSH 08/15/2022 2.100  0.450 - 4.500 uIU/mL Final   T4, Total 08/15/2022 5.2  4.5 - 12.0 ug/dL Final   T3 Uptake Ratio 08/15/2022 28  24 -  39 % Final   Free Thyroxine Index 08/15/2022 1.5  1.2 - 4.9 Final   HCV Ab 08/15/2022 Non Reactive  Non Reactive Final   HIV Screen 4th Generation wRfx 08/15/2022 Non Reactive  Non Reactive Final   Comment: HIV Negative HIV-1/HIV-2 antibodies and HIV-1 p24 antigen were NOT detected. There is no laboratory evidence of HIV infection.    RPR Ser Ql 08/15/2022 Non Reactive  Non Reactive Final   Neisseria Gonorrhea 08/15/2022 Negative   Final   Chlamydia 08/15/2022 Negative   Final   Trichomonas 08/15/2022  Negative   Final   Comment 08/15/2022 Normal Reference Ranger Chlamydia - Negative   Final   Comment 08/15/2022 Normal Reference Range Neisseria Gonorrhea - Negative   Final   Comment 08/15/2022 Normal Reference Range Trichomonas - Negative   Final   HCV Interp 1: 08/15/2022 Comment   Final   Comment: Not infected with HCV unless early or acute infection is suspected (which may be delayed in an immunocompromised individual), or other evidence exists to indicate HCV infection.   Admission on 07/21/2022, Discharged on 07/22/2022  Component Date Value Ref Range Status   Sodium 07/21/2022 140  135 - 145 mmol/L Final   Potassium 07/21/2022 4.3  3.5 - 5.1 mmol/L Final   Chloride 07/21/2022 105  98 - 111 mmol/L Final   CO2 07/21/2022 25  22 - 32 mmol/L Final   Glucose, Bld 07/21/2022 127 (H)  70 - 99 mg/dL Final   Glucose reference range applies only to samples taken after fasting for at least 8 hours.   BUN 07/21/2022 11  6 - 20 mg/dL Final   Creatinine, Ser 07/21/2022 0.94  0.61 - 1.24 mg/dL Final   Calcium 16/03/9603 8.7 (L)  8.9 - 10.3 mg/dL Final   Total Protein 54/02/8118 6.0 (L)  6.5 - 8.1 g/dL Final   Albumin 14/78/2956 3.8  3.5 - 5.0 g/dL Final   AST 21/30/8657 18  15 - 41 U/L Final   ALT 07/21/2022 12  0 - 44 U/L Final   Alkaline Phosphatase 07/21/2022 46  38 - 126 U/L Final   Total Bilirubin 07/21/2022 0.3  0.3 - 1.2 mg/dL Final   GFR, Estimated 07/21/2022 >60  >60 mL/min Final   Comment: (NOTE) Calculated using the CKD-EPI Creatinine Equation (2021)    Anion gap 07/21/2022 10  5 - 15 Final   Performed at [facility], [address], [place], [place] [address]   Alcohol, Ethyl (B) 07/21/2022 <10  <10 mg/dL Final   Comment: (NOTE) Lowest detectable limit for serum alcohol is 10 mg/dL.  For medical purposes only. Performed at [facility], [address], [place], [place] [address]    Salicylate Lvl 07/21/2022 <7.0 (L)  7.0 - 30.0 mg/dL Final   Performed at  [facility], [address], [place], [place] [address]   Acetaminophen (Tylenol), Serum 07/21/2022 <10 (L)  10 - 30 ug/mL Final   Comment: (NOTE) Therapeutic concentrations vary significantly. A range of 10-30 ug/mL  may be an effective concentration for many patients. However, some  are best treated at concentrations outside of this range. Acetaminophen concentrations >150 ug/mL at 4 hours after ingestion  and >50 ug/mL at 12 hours after ingestion are often associated with  toxic reactions.  Performed at [facility], [address] [place], [place] [address]    WBC 07/21/2022 6.4  4.0 - 10.5 K/uL Final   RBC 07/21/2022 4.73  4.22 - 5.81 MIL/uL Final   Hemoglobin 07/21/2022 13.8  13.0 - 17.0 g/dL Final   HCT 40/98/1191 43.7  39.0 - 52.0 % Final   MCV 07/21/2022 92.4  80.0 - 100.0 fL Final   MCH 07/21/2022 29.2  26.0 - 34.0 pg Final   MCHC 07/21/2022 31.6  30.0 - 36.0 g/dL Final   RDW 47/82/9562 13.4  11.5 - 15.5 % Final   Platelets 07/21/2022 240  150 - 400 K/uL Final   nRBC 07/21/2022 0.0  0.0 - 0.2 % Final   Performed at [facility], [address], [place], [place] [address]   Opiates 07/21/2022 NONE DETECTED  NONE DETECTED Final   Cocaine 07/21/2022 NONE DETECTED  NONE DETECTED Final   Benzodiazepines 07/21/2022 NONE DETECTED  NONE DETECTED Final   Amphetamines 07/21/2022 NONE DETECTED  NONE DETECTED Final   Tetrahydrocannabinol 07/21/2022 NONE DETECTED  NONE DETECTED Final   Barbiturates 07/21/2022 NONE DETECTED  NONE DETECTED Final   Comment: (NOTE) DRUG SCREEN FOR MEDICAL PURPOSES ONLY.  IF CONFIRMATION IS NEEDED FOR ANY PURPOSE, NOTIFY LAB WITHIN 5 DAYS.  LOWEST DETECTABLE LIMITS FOR URINE DRUG SCREEN Drug Class                     Cutoff (ng/mL) Amphetamine and metabolites    1000 Barbiturate and metabolites    200 Benzodiazepine                 200 Opiates and metabolites        300 Cocaine and metabolites        300 THC                             50 Performed at [facility], [address], [place], [place] [address]    Lithium Lvl 07/21/2022 <0.06 (L)  0.60 - 1.20 mmol/L Final   Performed at [facility], [address], [place], [place] [address]   Sodium 07/21/2022 141  135 - 145 mmol/L Final   Potassium 07/21/2022 4.3  3.5 - 5.1 mmol/L Final   Chloride 07/21/2022 107  98 - 111 mmol/L Final   CO2 07/21/2022 26  22 - 32 mmol/L Final   Glucose, Bld 07/21/2022 94  70 - 99 mg/dL Final   Glucose reference range applies only to samples taken after fasting for at least 8 hours.   BUN 07/21/2022 12  6 - 20 mg/dL Final   Creatinine, Ser 07/21/2022 0.99  0.61 - 1.24 mg/dL Final   Calcium 28/41/3244 8.5 (L)  8.9 - 10.3 mg/dL Final   Total Protein 06/15/7251 5.4 (L)  6.5 - 8.1 g/dL Final   Albumin 66/44/0347 3.4 (L)  3.5 - 5.0 g/dL Final   AST 42/59/5638 18  15 - 41 U/L Final   ALT 07/21/2022 11  0 - 44 U/L Final   Alkaline Phosphatase 07/21/2022 38  38 - 126 U/L Final   Total Bilirubin 07/21/2022 0.6  0.3 - 1.2 mg/dL Final   GFR, Estimated 07/21/2022 >60  >60 mL/min Final   Comment: (NOTE) Calculated using the CKD-EPI Creatinine Equation (2021)    Anion gap 07/21/2022 8  5 - 15 Final   Performed at [facility], [address], [place], [place] [address]   Acetaminophen (Tylenol), Serum 07/21/2022 <10 (L)  10 - 30 ug/mL Final   Comment: (NOTE) Therapeutic concentrations vary significantly. A range  of 10-30 ug/mL  may be an effective concentration for many patients. However, some  are best treated at concentrations outside of this range. Acetaminophen concentrations >150 ug/mL at 4 hours after ingestion  and >50 ug/mL at 12 hours after ingestion are often associated with  toxic reactions.  Performed at [facility], [address], [place], [place] [address]    Salicylate Lvl 07/21/2022 <7.0 (L)  7.0 - 30.0 mg/dL Final   Performed at [facility], [address] [place], [place] [address]    SARS Coronavirus 2 by RT PCR 07/22/2022 NEGATIVE  NEGATIVE Final   Influenza A by PCR 07/22/2022 NEGATIVE  NEGATIVE Final   Influenza B by PCR 07/22/2022 NEGATIVE  NEGATIVE Final   Comment: (NOTE) The Xpert Xpress SARS-CoV-2/FLU/RSV plus assay is intended as an aid in the diagnosis of influenza from Nasopharyngeal swab specimens and should not be used as a sole basis for treatment. Nasal washings and aspirates are unacceptable for Xpert Xpress SARS-CoV-2/FLU/RSV testing.  Fact Sheet for Patients: BloggerCourse.com  Fact Sheet for Healthcare Providers: SeriousBroker.it  This test is not yet approved or cleared by the [place] FDA and has been authorized for detection and/or diagnosis of SARS-CoV-2 by FDA under an Emergency Use Authorization (EUA). This EUA will remain in effect (meaning this test can be used) for the duration of the COVID-19 declaration under Section 564(b)(1) of the Act, 21 U.S.C. section 360bbb-3(b)(1), unless the authorization is terminated or revoked.     Resp Syncytial Virus by PCR 07/22/2022 NEGATIVE  NEGATIVE Final   Comment: (NOTE) Fact Sheet for Patients: BloggerCourse.com  Fact Sheet for Healthcare Providers: SeriousBroker.it  This test is not yet approved or cleared by the [place] FDA and has been authorized for detection and/or diagnosis of SARS-CoV-2 by FDA under an Emergency Use Authorization (EUA). This EUA will remain in effect (meaning this test can be used) for the duration of the COVID-19 declaration under Section 564(b)(1) of the Act, 21 U.S.C. section 360bbb-3(b)(1), unless the authorization is terminated or revoked.  Performed at [facility], [address], [place], [place] [address]   Admission on 07/20/2022, Discharged on 07/21/2022  Component Date Value Ref Range Status   Sodium 07/20/2022 135  135 - 145 mmol/L  Final   Potassium 07/20/2022 3.7  3.5 - 5.1 mmol/L Final   Chloride 07/20/2022 102  98 - 111 mmol/L Final   CO2 07/20/2022 25  22 - 32 mmol/L Final   Glucose, Bld 07/20/2022 164 (H)  70 - 99 mg/dL Final   Glucose reference range applies only to samples taken after fasting for at least 8 hours.   BUN 07/20/2022 18  6 - 20 mg/dL Final   Creatinine, Ser 07/20/2022 1.06  0.61 - 1.24 mg/dL Final   Calcium 24/40/1027 8.4 (L)  8.9 - 10.3 mg/dL Final   Total Protein 25/36/6440 6.9  6.5 - 8.1 g/dL Final   Albumin 34/74/2595 4.2  3.5 - 5.0 g/dL Final   AST 63/87/5643 24  15 - 41 U/L Final   ALT 07/20/2022 12  0 - 44 U/L Final   Alkaline Phosphatase 07/20/2022 47  38 - 126 U/L Final   Total Bilirubin 07/20/2022 0.5  0.3 - 1.2 mg/dL Final   GFR, Estimated 07/20/2022 >60  >60 mL/min Final   Comment: (NOTE) Calculated using the CKD-EPI Creatinine Equation (2021)    Anion gap 07/20/2022 8  5 - 15 Final   Performed at [facility], [address] [place], [place] [address]   Alcohol, Ethyl (B) 07/20/2022 <10  <10 mg/dL Final   Comment: (NOTE) Lowest detectable limit for serum alcohol is 10 mg/dL.  For medical purposes only. Performed at [facility], [address] [place], [place] [address]    Salicylate Lvl 07/20/2022 <7.0 (L)  7.0 - 30.0 mg/dL Final   Performed at [facility], [address] [place], [place] [address]   Acetaminophen (Tylenol), Serum 07/20/2022 <10 (L)  10 - 30 ug/mL Final   Comment: (NOTE) Therapeutic concentrations vary significantly. A range of 10-30 ug/mL  may be an effective concentration for many patients. However, some  are best treated at concentrations outside of this range. Acetaminophen concentrations >150 ug/mL at 4 hours after ingestion  and >50 ug/mL at 12 hours after ingestion are often associated with  toxic reactions.  Performed at [facility], [address] [place], [place] [address]    WBC 07/20/2022  10.9 (H)  4.0 - 10.5 K/uL Final   RBC 07/20/2022 4.68  4.22 - 5.81 MIL/uL Final   Hemoglobin 07/20/2022 13.8  13.0 - 17.0 g/dL Final   HCT 29/56/2130 41.9  39.0 - 52.0 % Final   MCV 07/20/2022 89.5  80.0 - 100.0 fL Final   MCH 07/20/2022 29.5  26.0 - 34.0 pg Final   MCHC 07/20/2022 32.9  30.0 - 36.0 g/dL Final   RDW 86/57/8469 13.5  11.5 - 15.5 % Final   Platelets 07/20/2022 261  150 - 400 K/uL Final   nRBC 07/20/2022 0.0  0.0 - 0.2 % Final   Performed at [facility], [address] [place], [place] [address]   Opiates 07/20/2022 NONE DETECTED  NONE DETECTED Final   Cocaine 07/20/2022 NONE DETECTED  NONE DETECTED Final   Benzodiazepines 07/20/2022 NONE DETECTED  NONE DETECTED Final   Amphetamines 07/20/2022 NONE DETECTED  NONE DETECTED Final   Tetrahydrocannabinol 07/20/2022 NONE DETECTED  NONE DETECTED Final   Barbiturates 07/20/2022 NONE DETECTED  NONE DETECTED Final   Comment: (NOTE) DRUG SCREEN FOR MEDICAL PURPOSES ONLY.  IF CONFIRMATION IS NEEDED FOR ANY PURPOSE, NOTIFY LAB WITHIN 5 DAYS.  LOWEST DETECTABLE LIMITS FOR URINE DRUG SCREEN Drug Class                     Cutoff (ng/mL) Amphetamine and metabolites    1000 Barbiturate and metabolites    200 Benzodiazepine                 200 Opiates and metabolites        300 Cocaine and metabolites        300 THC                            50 Performed at [facility], [address] [place], [place] [address]    Lithium Lvl 07/21/2022 <0.06 (L)  0.60 - 1.20 mmol/L Final   Performed at [facility], [address], [place], [place] [address]  Admission on 07/04/2022, Discharged on 07/07/2022  Component Date Value Ref Range Status   SARS Coronavirus 2 by RT PCR 07/04/2022 NEGATIVE  NEGATIVE Final   Comment: (NOTE) SARS-CoV-2 target nucleic acids are NOT DETECTED.  The SARS-CoV-2 RNA is generally detectable in upper respiratory specimens during the acute phase of infection. The  lowest concentration  of SARS-CoV-2 viral copies this assay can detect is 138 copies/mL. A negative result does not preclude SARS-Cov-2 infection and should not be used as the sole basis for treatment or other patient management decisions. A negative result may occur with  improper specimen collection/handling, submission of specimen other than nasopharyngeal swab, presence of viral mutation(s) within the areas targeted by this assay, and inadequate number of viral copies(<138 copies/mL). A negative result must be combined with clinical observations, patient history, and epidemiological information. The expected result is Negative.  Fact Sheet for Patients:  BloggerCourse.com  Fact Sheet for Healthcare Providers:  SeriousBroker.it  This test is no                          t yet approved or cleared by the [place] FDA and  has been authorized for detection and/or diagnosis of SARS-CoV-2 by FDA under an Emergency Use Authorization (EUA). This EUA will remain  in effect (meaning this test can be used) for the duration of the COVID-19 declaration under Section 564(b)(1) of the Act, 21 U.S.C.section 360bbb-3(b)(1), unless the authorization is terminated  or revoked sooner.       Influenza A by PCR 07/04/2022 NEGATIVE  NEGATIVE Final   Influenza B by PCR 07/04/2022 NEGATIVE  NEGATIVE Final   Comment: (NOTE) The Xpert Xpress SARS-CoV-2/FLU/RSV plus assay is intended as an aid in the diagnosis of influenza from Nasopharyngeal swab specimens and should not be used as a sole basis for treatment. Nasal washings and aspirates are unacceptable for Xpert Xpress SARS-CoV-2/FLU/RSV testing.  Fact Sheet for Patients: BloggerCourse.com  Fact Sheet for Healthcare Providers: SeriousBroker.it  This test is not yet approved or cleared by the [place] FDA and has been authorized for  detection and/or diagnosis of SARS-CoV-2 by FDA under an Emergency Use Authorization (EUA). This EUA will remain in effect (meaning this test can be used) for the duration of the COVID-19 declaration under Section 564(b)(1) of the Act, 21 U.S.C. section 360bbb-3(b)(1), unless the authorization is terminated or revoked.     Resp Syncytial Virus by PCR 07/04/2022 NEGATIVE  NEGATIVE Final   Comment: (NOTE) Fact Sheet for Patients: BloggerCourse.com  Fact Sheet for Healthcare Providers: SeriousBroker.it  This test is not yet approved or cleared by the [place] FDA and has been authorized for detection and/or diagnosis of SARS-CoV-2 by FDA under an Emergency Use Authorization (EUA). This EUA will remain in effect (meaning this test can be used) for the duration of the COVID-19 declaration under Section 564(b)(1) of the Act, 21 U.S.C. section 360bbb-3(b)(1), unless the authorization is terminated or revoked.  Performed at [facility], [address] [place], [place] [address]    WBC 07/04/2022 10.2  4.0 - 10.5 K/uL Final   RBC 07/04/2022 4.66  4.22 - 5.81 MIL/uL Final   Hemoglobin 07/04/2022 13.8  13.0 - 17.0 g/dL Final   HCT 52/84/1324 41.0  39.0 - 52.0 % Final   MCV 07/04/2022 88.0  80.0 - 100.0 fL Final   MCH 07/04/2022 29.6  26.0 - 34.0 pg Final   MCHC 07/04/2022 33.7  30.0 - 36.0 g/dL Final   RDW 40/03/2724 14.0  11.5 - 15.5 % Final   Platelets 07/04/2022 259  150 - 400 K/uL Final   nRBC 07/04/2022 0.0  0.0 - 0.2 % Final   Neutrophils Relative % 07/04/2022 57  % Final   Neutro Abs 07/04/2022 5.9  1.7 - 7.7 K/uL Final  Lymphocytes Relative 07/04/2022 31  % Final   Lymphs Abs 07/04/2022 3.2  0.7 - 4.0 K/uL Final   Monocytes Relative 07/04/2022 8  % Final   Monocytes Absolute 07/04/2022 0.8  0.1 - 1.0 K/uL Final   Eosinophils Relative 07/04/2022 2  % Final   Eosinophils Absolute 07/04/2022 0.2  0.0 - 0.5 K/uL Final    Basophils Relative 07/04/2022 1  % Final   Basophils Absolute 07/04/2022 0.1  0.0 - 0.1 K/uL Final   Immature Granulocytes 07/04/2022 1  % Final   Abs Immature Granulocytes 07/04/2022 0.11 (H)  0.00 - 0.07 K/uL Final   Performed at [facility], [address] [place], [place] [address]   Sodium 07/04/2022 139  135 - 145 mmol/L Final   Potassium 07/04/2022 3.4 (L)  3.5 - 5.1 mmol/L Final   Chloride 07/04/2022 102  98 - 111 mmol/L Final   CO2 07/04/2022 30  22 - 32 mmol/L Final   Glucose, Bld 07/04/2022 71  70 - 99 mg/dL Final   Glucose reference range applies only to samples taken after fasting for at least 8 hours.   BUN 07/04/2022 12  6 - 20 mg/dL Final   Creatinine, Ser 07/04/2022 0.95  0.61 - 1.24 mg/dL Final   Calcium 14/97/0263 9.0  8.9 - 10.3 mg/dL Final   Total Protein 78/58/8502 6.7  6.5 - 8.1 g/dL Final   Albumin 77/41/2878 4.2  3.5 - 5.0 g/dL Final   AST 67/67/2094 21  15 - 41 U/L Final   ALT 07/04/2022 17  0 - 44 U/L Final   Alkaline Phosphatase 07/04/2022 44  38 - 126 U/L Final   Total Bilirubin 07/04/2022 0.3  0.3 - 1.2 mg/dL Final   GFR, Estimated 07/04/2022 >60  >60 mL/min Final   Comment: (NOTE) Calculated using the CKD-EPI Creatinine Equation (2021)    Anion gap 07/04/2022 7  5 - 15 Final   Performed at [facility], [address], [place], [place] [address]   Hgb A1c MFr Bld 07/04/2022 4.9  4.8 - 5.6 % Final   Comment: (NOTE) Pre diabetes:          5.7%-6.4%  Diabetes:              >6.4%  Glycemic control for   <7.0% adults with diabetes    Mean Plasma Glucose 07/04/2022 93.93  mg/dL Final   Performed at [facility], [address] [place], [place] [address]   POC Amphetamine UR 07/04/2022 None Detected  NONE DETECTED (Cut Off Level 1000 ng/mL) Final   POC Secobarbital (BAR) 07/04/2022 None Detected  NONE DETECTED (Cut Off Level 300 ng/mL) Final   POC Buprenorphine (BUP) 07/04/2022 None Detected  NONE DETECTED (Cut Off Level 10 ng/mL)  Final   POC Oxazepam (BZO) 07/04/2022 None Detected  NONE DETECTED (Cut Off Level 300 ng/mL) Final   POC Cocaine UR 07/04/2022 None Detected  NONE DETECTED (Cut Off Level 300 ng/mL) Final   POC Methamphetamine UR 07/04/2022 None Detected  NONE DETECTED (Cut Off Level 1000 ng/mL) Final   POC Morphine 07/04/2022 None Detected  NONE DETECTED (Cut Off Level 300 ng/mL) Final   POC Methadone UR 07/04/2022 None Detected  NONE DETECTED (Cut Off Level 300 ng/mL) Final   POC Oxycodone UR 07/04/2022 None Detected  NONE DETECTED (Cut Off Level 100 ng/mL) Final   POC Marijuana UR 07/04/2022 None Detected  NONE DETECTED (Cut Off Level 50 ng/mL) Final  Lithium Lvl 07/04/2022 0.49 (L)  0.60 - 1.20 mmol/L Final   Performed at [facility], [address] [place], [place] [address]   Valproic Acid Lvl 07/04/2022 52  50.0 - 100.0 ug/mL Final   Performed at [facility], [address] [place], [place] [address]   SARSCOV2ONAVIRUS 2 AG 07/04/2022 NEGATIVE  NEGATIVE Final   Comment: (NOTE) SARS-CoV-2 antigen NOT DETECTED.   Negative results are presumptive.  Negative results do not preclude SARS-CoV-2 infection and should not be used as the sole basis for treatment or other patient management decisions, including infection  control decisions, particularly in the presence of clinical signs and  symptoms consistent with COVID-19, or in those who have been in contact with the virus.  Negative results must be combined with clinical observations, patient history, and epidemiological information. The expected result is Negative.  Fact Sheet for Patients: https://www.jennings-kim.com/  Fact Sheet for Healthcare Providers: https://alexander-rogers.biz/  This test is not yet approved or cleared by the [place] FDA and  has been authorized for detection and/or diagnosis of SARS-CoV-2 by FDA under an Emergency Use Authorization (EUA).  This EUA will remain in effect  (meaning this test can be used) for the duration of  the COV                          ID-19 declaration under Section 564(b)(1) of the Act, 21 U.S.C. section 360bbb-3(b)(1), unless the authorization is terminated or revoked sooner.     Cholesterol 07/04/2022 150  0 - 200 mg/dL Final   Triglycerides 33/29/5188 106  <150 mg/dL Final   HDL 41/66/0630 50  >40 mg/dL Final   Total CHOL/HDL Ratio 07/04/2022 3.0  RATIO Final   VLDL 07/04/2022 21  0 - 40 mg/dL Final   LDL Cholesterol 07/04/2022 79  0 - 99 mg/dL Final   Comment:        Total Cholesterol/HDL:CHD Risk Coronary Heart Disease Risk Table                     Men   Women  1/2 Average Risk   3.4   3.3  Average Risk       5.0   4.4  2 X Average Risk   9.6   7.1  3 X Average Risk  23.4   11.0        Use the calculated Patient Ratio above and the CHD Risk Table to determine the patient's CHD Risk.        ATP III CLASSIFICATION (LDL):  <100     mg/dL   Optimal  160-109  mg/dL   Near or Above                    Optimal  130-159  mg/dL   Borderline  323-557  mg/dL   High  >322     mg/dL   Very High Performed at [facility], [address] [place], [place] [address]    TSH 07/04/2022 5.790 (H)  0.350 - 4.500 uIU/mL Final   Comment: Performed by a 3rd Generation assay with a functional sensitivity of <=0.01 uIU/mL. Performed at [facility], [address] [place], [place] [address]    T3, Free 07/04/2022 3.6  2.0 - 4.4 pg/mL Final   Comment: (NOTE) Performed At: [facility] [address] [place], [place] [id number] Jolene Schimke MD OH:[id number]  Free T4 07/04/2022 0.65  0.61 - 1.12 ng/dL Final   Comment: (NOTE) Biotin ingestion may interfere with free T4 tests. If the results are inconsistent with the TSH level, previous test results, or the clinical presentation, then consider biotin interference. If needed, order repeat testing after stopping biotin. Performed at [facility], [address], [place], [place] [address]   Admission on 06/03/2022, Discharged on 06/07/2022  Component Date Value Ref Range Status   Sodium 06/03/2022 137  135 - 145 mmol/L Final   Potassium 06/03/2022 3.5  3.5 - 5.1 mmol/L Final   Chloride 06/03/2022 107  98 - 111 mmol/L Final   CO2 06/03/2022 22  22 - 32 mmol/L Final   Glucose, Bld 06/03/2022 99  70 - 99 mg/dL Final   Glucose reference range applies only to samples taken after fasting for at least 8 hours.   BUN 06/03/2022 7  6 - 20 mg/dL Final   Creatinine, Ser 06/03/2022 0.93  0.61 - 1.24 mg/dL Final   Calcium 60/45/4098 9.1  8.9 - 10.3 mg/dL Final   Total Protein 11/91/4782 7.4  6.5 - 8.1 g/dL Final   Albumin 95/62/1308 4.4  3.5 - 5.0 g/dL Final   AST 65/78/4696 19  15 - 41 U/L Final   ALT 06/03/2022 13  0 - 44 U/L Final   Alkaline Phosphatase 06/03/2022 57  38 - 126 U/L Final   Total Bilirubin 06/03/2022 0.9  0.3 - 1.2 mg/dL Final   GFR, Estimated 06/03/2022 >60  >60 mL/min Final   Comment: (NOTE) Calculated using the CKD-EPI Creatinine Equation (2021)    Anion gap 06/03/2022 8  5 - 15 Final   Performed at [facility], [address] [place], [place] [address]   Alcohol, Ethyl (B) 06/03/2022 <10  <10 mg/dL Final   Comment: (NOTE) Lowest detectable limit for serum alcohol is 10 mg/dL.  For medical purposes only. Performed at [facility], [address] [place], [place] [address]    Salicylate Lvl 06/03/2022 <7.0 (L)  7.0 - 30.0 mg/dL Final   Performed at [facility], [address] [place], [place] [address]   Acetaminophen (Tylenol), Serum 06/03/2022 <10 (L)  10 - 30 ug/mL Final   Comment: (NOTE) Therapeutic concentrations vary significantly. A range of 10-30 ug/mL  may be an effective concentration for many patients. However, some  are best treated at concentrations outside of this range. Acetaminophen concentrations >150 ug/mL at 4 hours after ingestion  and >50 ug/mL at 12 hours after  ingestion are often associated with  toxic reactions.  Performed at [facility], [address] [place], [place] [address]    WBC 06/03/2022 8.5  4.0 - 10.5 K/uL Final   RBC 06/03/2022 5.11  4.22 - 5.81 MIL/uL Final   Hemoglobin 06/03/2022 14.8  13.0 - 17.0 g/dL Final   HCT 64/40/3474 43.8  39.0 - 52.0 % Final   MCV 06/03/2022 85.7  80.0 - 100.0 fL Final   MCH 06/03/2022 29.0  26.0 - 34.0 pg Final   MCHC 06/03/2022 33.8  30.0 - 36.0 g/dL Final   RDW 25/95/6387 14.0  11.5 - 15.5 % Final   Platelets 06/03/2022 249  150 - 400 K/uL Final   nRBC 06/03/2022 0.0  0.0 - 0.2 % Final   Performed at [facility], [address] [place], [place] [address]   Opiates 06/03/2022 NONE DETECTED  NONE DETECTED Final   Cocaine 06/03/2022 NONE DETECTED  NONE DETECTED Final   Benzodiazepines 06/03/2022 NONE DETECTED  NONE DETECTED Final   Amphetamines 06/03/2022 POSITIVE (A)  NONE DETECTED Final   Tetrahydrocannabinol 06/03/2022 POSITIVE (A)  NONE DETECTED Final   Barbiturates 06/03/2022 NONE DETECTED  NONE DETECTED Final   Comment: (NOTE) DRUG SCREEN FOR MEDICAL PURPOSES ONLY.  IF CONFIRMATION IS NEEDED FOR ANY PURPOSE, NOTIFY LAB WITHIN 5 DAYS.  LOWEST DETECTABLE LIMITS FOR URINE DRUG SCREEN Drug Class                     Cutoff (ng/mL) Amphetamine and metabolites    1000 Barbiturate and metabolites    200 Benzodiazepine                 200 Opiates and metabolites        300 Cocaine and metabolites        300 THC                            50 Performed at [facility], [address] [place], [place] [address]    SARS Coronavirus 2 by RT PCR 06/03/2022 NEGATIVE  NEGATIVE Final   Comment: (NOTE) SARS-CoV-2 target nucleic acids are NOT DETECTED.  The SARS-CoV-2 RNA is generally detectable in upper respiratory specimens during the acute phase of infection. The lowest concentration of SARS-CoV-2 viral copies this assay can detect is 138 copies/mL. A  negative result does not preclude SARS-Cov-2 infection and should not be used as the sole basis for treatment or other patient management decisions. A negative result may occur with  improper specimen collection/handling, submission of specimen other than nasopharyngeal swab, presence of viral mutation(s) within the areas targeted by this assay, and inadequate number of viral copies(<138 copies/mL). A negative result must be combined with clinical observations, patient history, and epidemiological information. The expected result is Negative.  Fact Sheet for Patients:  BloggerCourse.com  Fact Sheet for Healthcare Providers:  SeriousBroker.it  This test is no                          t yet approved or cleared by the [place] FDA and  has been authorized for detection and/or diagnosis of SARS-CoV-2 by FDA under an Emergency Use Authorization (EUA). This EUA will remain  in effect (meaning this test can be used) for the duration of the COVID-19 declaration under Section 564(b)(1) of the Act, 21 U.S.C.section 360bbb-3(b)(1), unless the authorization is terminated  or revoked sooner.       Influenza A by PCR 06/03/2022 NEGATIVE  NEGATIVE Final   Influenza B by PCR 06/03/2022 NEGATIVE  NEGATIVE Final   Comment: (NOTE) The Xpert Xpress SARS-CoV-2/FLU/RSV plus assay is intended as an aid in the diagnosis of influenza from Nasopharyngeal swab specimens and should not be used as a sole basis for treatment. Nasal washings and aspirates are unacceptable for Xpert Xpress SARS-CoV-2/FLU/RSV testing.  Fact Sheet for Patients: BloggerCourse.com  Fact Sheet for Healthcare Providers: SeriousBroker.it  This test is not yet approved or cleared by the [place] FDA and has been authorized for detection and/or diagnosis of SARS-CoV-2 by FDA under an Emergency Use Authorization (EUA). This  EUA will remain in effect (meaning this test can be used) for the duration of the COVID-19 declaration under Section 564(b)(1) of the Act, 21 U.S.C. section 360bbb-3(b)(1), unless  the authorization is terminated or revoked.     Resp Syncytial Virus by PCR 06/03/2022 NEGATIVE  NEGATIVE Final   Comment: (NOTE) Fact Sheet for Patients: BloggerCourse.com  Fact Sheet for Healthcare Providers: SeriousBroker.it  This test is not yet approved or cleared by the [place] FDA and has been authorized for detection and/or diagnosis of SARS-CoV-2 by FDA under an Emergency Use Authorization (EUA). This EUA will remain in effect (meaning this test can be used) for the duration of the COVID-19 declaration under Section 564(b)(1) of the Act, 21 U.S.C. section 360bbb-3(b)(1), unless the authorization is terminated or revoked.  Performed at [facility], [address] [place], [place] [address]   Admission on 04/26/2022, Discharged on 04/27/2022  Component Date Value Ref Range Status   WBC 04/26/2022 9.3  4.0 - 10.5 K/uL Final   RBC 04/26/2022 5.25  4.22 - 5.81 MIL/uL Final   Hemoglobin 04/26/2022 15.4  13.0 - 17.0 g/dL Final   HCT 60/45/4098 46.4  39.0 - 52.0 % Final   MCV 04/26/2022 88.4  80.0 - 100.0 fL Final   MCH 04/26/2022 29.3  26.0 - 34.0 pg Final   MCHC 04/26/2022 33.2  30.0 - 36.0 g/dL Final   RDW 11/91/4782 14.0  11.5 - 15.5 % Final   Platelets 04/26/2022 250  150 - 400 K/uL Final   nRBC 04/26/2022 0.0  0.0 - 0.2 % Final   Neutrophils Relative % 04/26/2022 47  % Final   Neutro Abs 04/26/2022 4.3  1.7 - 7.7 K/uL Final   Lymphocytes Relative 04/26/2022 46  % Final   Lymphs Abs 04/26/2022 4.3 (H)  0.7 - 4.0 K/uL Final   Monocytes Relative 04/26/2022 6  % Final   Monocytes Absolute 04/26/2022 0.5  0.1 - 1.0 K/uL Final   Eosinophils Relative 04/26/2022 1  % Final   Eosinophils Absolute 04/26/2022 0.1  0.0 - 0.5 K/uL  Final   Basophils Relative 04/26/2022 0  % Final   Basophils Absolute 04/26/2022 0.0  0.0 - 0.1 K/uL Final   Immature Granulocytes 04/26/2022 0  % Final   Abs Immature Granulocytes 04/26/2022 0.02  0.00 - 0.07 K/uL Final   Performed at [facility], [address] [place], [place] [address]   Sodium 04/26/2022 140  135 - 145 mmol/L Final   Potassium 04/26/2022 3.8  3.5 - 5.1 mmol/L Final   Chloride 04/26/2022 105  98 - 111 mmol/L Final   CO2 04/26/2022 29  22 - 32 mmol/L Final   Glucose, Bld 04/26/2022 86  70 - 99 mg/dL Final   Glucose reference range applies only to samples taken after fasting for at least 8 hours.   BUN 04/26/2022 10  6 - 20 mg/dL Final   Creatinine, Ser 04/26/2022 1.02  0.61 - 1.24 mg/dL Final   Calcium 30/86/5784 9.5  8.9 - 10.3 mg/dL Final   Total Protein 69/62/9528 7.0  6.5 - 8.1 g/dL Final   Albumin 41/32/4401 4.5  3.5 - 5.0 g/dL Final   AST 02/72/5366 15  15 - 41 U/L Final   ALT 04/26/2022 14  0 - 44 U/L Final   Alkaline Phosphatase 04/26/2022 48  38 - 126 U/L Final   Total Bilirubin 04/26/2022 0.6  0.3 - 1.2 mg/dL Final   GFR, Estimated 04/26/2022 >60  >60 mL/min Final   Comment: (NOTE) Calculated using the CKD-EPI Creatinine Equation (2021)    Anion gap 04/26/2022 6  5 - 15 Final  Performed at [facility], [address] [place], [place] [address]   Alcohol, Ethyl (B) 04/26/2022 <10  <10 mg/dL Final   Comment: (NOTE) Lowest detectable limit for serum alcohol is 10 mg/dL.  For medical purposes only. Performed at [facility], [address] [place], [place] [address]    Opiates 04/26/2022 NONE DETECTED  NONE DETECTED Final   Cocaine 04/26/2022 NONE DETECTED  NONE DETECTED Final   Benzodiazepines 04/26/2022 NONE DETECTED  NONE DETECTED Final   Amphetamines 04/26/2022 POSITIVE (A)  NONE DETECTED Final   Tetrahydrocannabinol 04/26/2022 POSITIVE (A)  NONE DETECTED Final   Barbiturates  04/26/2022 NONE DETECTED  NONE DETECTED Final   Comment: (NOTE) DRUG SCREEN FOR MEDICAL PURPOSES ONLY.  IF CONFIRMATION IS NEEDED FOR ANY PURPOSE, NOTIFY LAB WITHIN 5 DAYS.  LOWEST DETECTABLE LIMITS FOR URINE DRUG SCREEN Drug Class                     Cutoff (ng/mL) Amphetamine and metabolites    1000 Barbiturate and metabolites    200 Benzodiazepine                 200 Opiates and metabolites        300 Cocaine and metabolites        300 THC                            50 Performed at [facility], [address] [place], [place] [address]     Blood Alcohol level:  Lab Results  Component Value Date   ETH <10 09/16/2022   ETH <10 08/18/2022    Metabolic Disorder Labs: Lab Results  Component Value Date   HGBA1C 4.9 07/04/2022   MPG 93.93 07/04/2022   MPG 93.93 11/10/2021   No results found for: "PROLACTIN" Lab Results  Component Value Date   CHOL 150 07/04/2022   TRIG 106 07/04/2022   HDL 50 07/04/2022   CHOLHDL 3.0 07/04/2022   VLDL 21 07/04/2022   LDLCALC 79 07/04/2022   LDLCALC 55 11/10/2021    Therapeutic Lab Levels: Lab Results  Component Value Date   LITHIUM <0.06 (L) 09/17/2022   LITHIUM <0.06 (L) 07/21/2022   Lab Results  Component Value Date   VALPROATE <10 (L) 09/16/2022   VALPROATE 55 08/15/2022   No results found for: "CBMZ"  Physical Findings   AIMS    Flowsheet Row [facility]) [facility] Ad[facility] ([facility]) [facility]  AIMS Total Score 0 0      AUDIT    Flowsheet Row [facility]) [facility] Ad[facility] (Dis[facility]  Alcohol Use Disorder Identification Test Final Score (AUDIT) 0 0      PHQ2-9    Flowsheet Row [facility]  PHQ-2 Total Score 4 6 6   PHQ-9 Total Score 25 24 25       Flowsheet Row ED [facility]20[facility] from [facility] from [facility]20[facility]  C-SSRS RISK CATEGORY High Risk High Risk High Risk        Musculoskeletal  Strength & Muscle Tone: within normal limits Gait & Station: normal Patient leans: N/A  Psychiatric Specialty Exam  Presentation  General Appearance:  Disheveled  Eye Contact: Fair  Speech: Clear and Coherent; Normal Rate  Speech Volume: Normal  Handedness: Right   Mood and Affect  Mood: Depressed  Affect: Congruent   Thought Process  Thought Processes: Coherent  Descriptions of Associations:Intact  Orientation:Full (Time, Place and Person)  Thought Content:Logical  Diagnosis of Schizophrenia or Schizoaffective disorder in past: Yes (Pt reports that he has been told that he has diagnosis of schizoffective d/o)  Duration of Psychotic Symptoms: Greater than six months   Hallucinations:Hallucinations: None  Ideas of Reference:None  Suicidal Thoughts:Suicidal Thoughts: No SI Passive Intent and/or Plan: Without Intent; Without Plan  Homicidal Thoughts:Homicidal Thoughts: No   Sensorium  Memory: Immediate Good; Recent Good; Remote Good  Judgment: Fair  Insight: Fair   Art therapist  Concentration: Good  Attention Span: Good  Recall: Good  Fund of Knowledge: Good  Language: Good   Psychomotor Activity  Psychomotor Activity: Psychomotor Activity: Normal   Assets  Assets: Communication Skills; Desire for Improvement; Leisure Time; Physical Health; Resilience   Sleep  Sleep: Sleep: Fair   No data  recorded  Physical Exam  Physical Exam Vitals and nursing note reviewed.  Constitutional:      General: He is not in acute distress.    Appearance: He is well-developed.  HENT:     Head: Normocephalic.  Eyes:     Conjunctiva/sclera: Conjunctivae normal.  Pulmonary:     Effort: Pulmonary effort is normal.  Musculoskeletal:        General: Normal range of motion.  Skin:    Coloration: Skin is not jaundiced or pale.  Neurological:     Mental Status: He is alert and oriented to person, place, and time.  Psychiatric:        Attention and Perception: Attention and perception normal.        Mood and Affect: Mood is depressed.        Speech: Speech normal.        Behavior: Behavior normal. Behavior is cooperative.        Thought Content: Thought content normal.        Cognition and Memory: Cognition normal.        Judgment: Judgment is impulsive.    Review of Systems  Constitutional: Negative.   HENT: Negative.    Eyes: Negative.   Respiratory: Negative.    Cardiovascular: Negative.   Musculoskeletal: Negative.   Skin: Negative.   Neurological: Negative.   Psychiatric/Behavioral:  Positive for depression.    Blood pressure 130/89, pulse 100, temperature 97.7 F (36.5 C), temperature source Oral, resp. rate 18, SpO2 100 %. There is no height or weight on file to calculate BMI.  Treatment Plan Summary:   Will continue to have Daily contact with patient to assess and evaluate symptoms and progress in treatment and Medication management.   Social work is referring patient to [facility] residential.  Ardis Hughs, NP 09/19/2022 9:32 AM

## 2022-09-19 NOTE — ED Provider Notes (Signed)
Facility Based Crisis Admission H&P  Date: 09/19/22 Patient Name: Jon Clayton MRN: [id number] Chief Complaint: SI  Diagnoses:  Final diagnoses:  Depression, unspecified depression type    HPI: HPI: Patient initially presented to [facility] emergency department.  He was recommended for continuous assessment and was transferred to [facility] UC for overnight observation.   Jon Clayton, 27 y.o., male patient seen face to face by this provider, consulted with Dr. Lucianne Muss; and chart reviewed on 09/19/22.   UDS upon admission is negative and EtOH is less than 7   Subjective:   Patient has had multiple ED visits and inpatient psychiatric hospitalizations.  Per note from previous NP Darrick Grinder, "He reports prior to being seen at [facility] he was seen at [facility] where he states he was discharged "after 4 hours". Per note from [facility], pt was brought in by police with IVC paperwork from [facility] after reporting SI/HI and hallucinations. Pt reports he is a sex offender and is currently on probation. He reports he is homeless and unemployed."   On today's assessment Jon Clayton is observed laying in his bed asleep.  He is easily awakened.  He is disheveled and makes fair eye contact.  He has normal speech and behavior.  He is alert/oriented x 4, calm, and cooperative.  He reports his mood has improved.  He denies any concerns with appetite or sleep.  He is tolerating medications without any adverse reactions.  He is denying any SI/HI/AVH.  He verbally contracts for safety.  States he is feeling more hopeful today due to being able to possibly go to [facility].  He does not appear to be responding to any internal/external stimuli.  He does not appear psychotic or manic.    He is requesting to be transferred to [facility] residential.  States he was told that he could present today after 1 PM.  However social worker contacted [employer] and patient is eligible to reapply as of today.  A referral will be sent on patient's  behalf.  Patient states that he keeps presenting to the emergency department seeking help and feels as though he is dismissed.  He is denying any withdrawal symptoms.  He has a past history of methamphetamine, alcohol, and marijuana use.  Patient reports he has not used methamphetamines since January.  However per chart review patient's reports of substance is inconsistent.    Discussed admission to the facility base crisis unit.  Patient is in agreement   PHQ 2-9:  Flowsheet Row ED from 09/17/2022 in [facility] ED from 11/05/2021 in [facility] ED from 11/04/2021 in [facility] Emergency Department at [facility]  Thoughts that you would be better off dead, or of hurting yourself in some way Nearly every day Nearly every day Nearly every day  PHQ-9 Total Score 24 25 24        Flowsheet Row ED from 09/17/2022 in [facility] ED from 09/16/2022 in [facility] Emergency Department at [facility] ED from 08/18/2022 in [facility] Emergency Department at [facility]  C-SSRS RISK CATEGORY Low Risk High Risk High Risk        Total Time spent with patient: 30 minutes  Musculoskeletal  Strength & Muscle Tone: within normal limits Gait & Station: normal Patient leans: N/A  Psychiatric Specialty Exam  Presentation General Appearance:  Disheveled  Eye Contact: Fair  Speech: Clear and Coherent; Normal Rate  Speech Volume: Normal  Handedness:  Right   Mood and Affect  Mood: Depressed  Affect: Congruent   Thought Process  Thought Processes: Coherent  Descriptions of Associations:Intact  Orientation:Full (Time, Place and Person)  Thought Content:Logical  Diagnosis of Schizophrenia or Schizoaffective disorder in past: Yes (Pt reports that he has been told that he has diagnosis of schizoffective d/o)  Duration of Psychotic Symptoms: Greater than six  months  Hallucinations:Hallucinations: None  Ideas of Reference:None  Suicidal Thoughts:Suicidal Thoughts: No SI Passive Intent and/or Plan: Without Intent; Without Plan  Homicidal Thoughts:Homicidal Thoughts: No   Sensorium  Memory: Immediate Good; Recent Good; Remote Good  Judgment: Fair  Insight: Fair   Art therapist  Concentration: Good  Attention Span: Good  Recall: Good  Fund of Knowledge: Good  Language: Good   Psychomotor Activity  Psychomotor Activity: Psychomotor Activity: Normal   Assets  Assets: Communication Skills; Desire for Improvement; Leisure Time; Physical Health; Resilience   Sleep  Sleep: Sleep: Fair   No data recorded  Physical Exam ROS  Blood pressure 130/89, pulse 100, temperature 97.7 F (36.5 C), temperature source Oral, resp. rate 18, SpO2 100 %. There is no height or weight on file to calculate BMI.  Past Psychiatric History: Depression, psychosis, PTSD  Is the patient at risk to self? No  Has the patient been a risk to self in the past 6 months? Yes .    Has the patient been a risk to self within the distant past? Yes   Is the patient a risk to others? No   Has the patient been a risk to others in the past 6 months? No   Has the patient been a risk to others within the distant past? No   Past Medical History: Fibromyalgia, poly substance abuse, Vit. D deficiency  Family History: None reported Social History:   Homeless Unemployed Single       Tobacco Use   Smoking status: Every Day      Packs/day: 1.00      Years: 11.00      Additional pack years: 0.00      Total pack years: 11.00      Types: Cigarettes   Smokeless tobacco: Never  Vaping Use   Vaping Use: Never used  Substance Use Topics   Alcohol use: Not Currently      Comment: reports periodic alcohol use   Drug use: Yes      Frequency: 1.0 times per week      Types: Marijuana      Comment: 1/3 gm daily    Last Labs:  Admission  on 09/16/2022, Discharged on 09/17/2022  Component Date Value Ref Range Status   Opiates 09/16/2022 NONE DETECTED  NONE DETECTED Final   Cocaine 09/16/2022 NONE DETECTED  NONE DETECTED Final   Benzodiazepines 09/16/2022 NONE DETECTED  NONE DETECTED Final   Amphetamines 09/16/2022 NONE DETECTED  NONE DETECTED Final   Tetrahydrocannabinol 09/16/2022 NONE DETECTED  NONE DETECTED Final   Barbiturates 09/16/2022 NONE DETECTED  NONE DETECTED Final   Comment: (NOTE) DRUG SCREEN FOR MEDICAL PURPOSES ONLY.  IF CONFIRMATION IS NEEDED FOR ANY PURPOSE, NOTIFY LAB WITHIN 5 DAYS.  LOWEST DETECTABLE LIMITS FOR URINE DRUG SCREEN Drug Class                     Cutoff (ng/mL) Amphetamine and metabolites    1000 Barbiturate and metabolites    200 Benzodiazepine  200 Opiates and metabolites        300 Cocaine and metabolites        300 THC                            50 Performed at [facility], [address], [place], [place] [address]    Sodium 09/16/2022 137  135 - 145 mmol/L Final   Potassium 09/16/2022 3.9  3.5 - 5.1 mmol/L Final   Chloride 09/16/2022 104  98 - 111 mmol/L Final   CO2 09/16/2022 26  22 - 32 mmol/L Final   Glucose, Bld 09/16/2022 80  70 - 99 mg/dL Final   Glucose reference range applies only to samples taken after fasting for at least 8 hours.   BUN 09/16/2022 18  6 - 20 mg/dL Final   Creatinine, Ser 09/16/2022 0.87  0.61 - 1.24 mg/dL Final   Calcium 40/98/1191 8.6 (L)  8.9 - 10.3 mg/dL Final   Total Protein 47/82/9562 6.8  6.5 - 8.1 g/dL Final   Albumin 13/01/6577 4.2  3.5 - 5.0 g/dL Final   AST 46/96/2952 34  15 - 41 U/L Final   ALT 09/16/2022 36  0 - 44 U/L Final   Alkaline Phosphatase 09/16/2022 42  38 - 126 U/L Final   Total Bilirubin 09/16/2022 0.5  0.3 - 1.2 mg/dL Final   GFR, Estimated 09/16/2022 >60  >60 mL/min Final   Comment: (NOTE) Calculated using the CKD-EPI Creatinine Equation (2021)    Anion gap 09/16/2022 7  5 - 15 Final    Performed at [facility], [address], [place], [place] [address]   Alcohol, Ethyl (B) 09/16/2022 <10  <10 mg/dL Final   Comment: (NOTE) Lowest detectable limit for serum alcohol is 10 mg/dL.  For medical purposes only. Performed at [facility], [address] [place], [place] [address]    WBC 09/16/2022 7.4  4.0 - 10.5 K/uL Final   RBC 09/16/2022 4.89  4.22 - 5.81 MIL/uL Final   Hemoglobin 09/16/2022 14.5  13.0 - 17.0 g/dL Final   HCT 27/25/3664 42.6  39.0 - 52.0 % Final   MCV 09/16/2022 87.1  80.0 - 100.0 fL Final   MCH 09/16/2022 29.7  26.0 - 34.0 pg Final   MCHC 09/16/2022 34.0  30.0 - 36.0 g/dL Final   RDW 40/34/7425 13.2  11.5 - 15.5 % Final   Platelets 09/16/2022 229  150 - 400 K/uL Final   nRBC 09/16/2022 0.0  0.0 - 0.2 % Final   Neutrophils Relative % 09/16/2022 59  % Final   Neutro Abs 09/16/2022 4.3  1.7 - 7.7 K/uL Final   Lymphocytes Relative 09/16/2022 33  % Final   Lymphs Abs 09/16/2022 2.5  0.7 - 4.0 K/uL Final   Monocytes Relative 09/16/2022 7  % Final   Monocytes Absolute 09/16/2022 0.5  0.1 - 1.0 K/uL Final   Eosinophils Relative 09/16/2022 1  % Final   Eosinophils Absolute 09/16/2022 0.1  0.0 - 0.5 K/uL Final   Basophils Relative 09/16/2022 0  % Final   Basophils Absolute 09/16/2022 0.0  0.0 - 0.1 K/uL Final   Immature Granulocytes 09/16/2022 0  % Final   Abs Immature Granulocytes 09/16/2022 0.03  0.00 - 0.07 K/uL Final   Performed at [facility], [address] [place], [place] [address]   Total CK 09/16/2022 498 (H)  49 - 397 U/L  Final   Performed at [facility], [address], [place], [place] [address]   Acetaminophen (Tylenol), Serum 09/16/2022 <10 (L)  10 - 30 ug/mL Final   Comment: (NOTE) Therapeutic concentrations vary significantly. A range of 10-30 ug/mL  may be an effective concentration for many patients. However, some  are best treated at concentrations  outside of this range. Acetaminophen concentrations >150 ug/mL at 4 hours after ingestion  and >50 ug/mL at 12 hours after ingestion are often associated with  toxic reactions.  Performed at [facility], [address] [place], [place] [address]    Salicylate Lvl 09/16/2022 <7.0 (L)  7.0 - 30.0 mg/dL Final   Performed at [facility], [address] [place], [place] [address]   Valproic Acid Lvl 09/16/2022 <10 (L)  50.0 - 100.0 ug/mL Final   Comment: RESULT CONFIRMED BY MANUAL DILUTION Performed at [facility], [address], [place], [place] [address]    SARS Coronavirus 2 by RT PCR 09/16/2022 NEGATIVE  NEGATIVE Final   Comment: (NOTE) SARS-CoV-2 target nucleic acids are NOT DETECTED.  The SARS-CoV-2 RNA is generally detectable in upper and lower respiratory specimens during the acute phase of infection. The lowest concentration of SARS-CoV-2 viral copies this assay can detect is 250 copies / mL. A negative result does not preclude SARS-CoV-2 infection and should not be used as the sole basis for treatment or other patient management decisions.  A negative result may occur with improper specimen collection / handling, submission of specimen other than nasopharyngeal swab, presence of viral mutation(s) within the areas targeted by this assay, and inadequate number of viral copies (<250 copies / mL). A negative result must be combined with clinical observations, patient history, and epidemiological information.  Fact Sheet for Patients:   RoadLapTop.co.za  Fact Sheet for Healthcare Providers: http://kim-Hennington.com/  This test is not yet approved or                           cleared by the [place] FDA and has been authorized for detection and/or diagnosis of SARS-CoV-2 by FDA under an Emergency Use Authorization (EUA).  This EUA will remain in effect (meaning this test  can be used) for the duration of the COVID-19 declaration under Section 564(b)(1) of the Act, 21 U.S.C. section 360bbb-3(b)(1), unless the authorization is terminated or revoked sooner.  Performed at [facility], [address], [place], [place] [address]    Lithium Lvl 09/17/2022 <0.06 (L)  0.60 - 1.20 mmol/L Final   Performed at [facility], [address], [place], [place] [address]  Admission on 08/18/2022, Discharged on 08/20/2022  Component Date Value Ref Range Status   Sodium 08/18/2022 139  135 - 145 mmol/L Final   Potassium 08/18/2022 3.9  3.5 - 5.1 mmol/L Final   Chloride 08/18/2022 106  98 - 111 mmol/L Final   CO2 08/18/2022 24  22 - 32 mmol/L Final   Glucose, Bld 08/18/2022 98  70 - 99 mg/dL Final   Glucose reference range applies only to samples taken after fasting for at least 8 hours.   BUN 08/18/2022 13  6 - 20 mg/dL Final   Creatinine, Ser 08/18/2022 0.91  0.61 - 1.24 mg/dL Final   Calcium 24/40/1027 8.9  8.9 - 10.3 mg/dL Final   Total Protein 25/36/6440 7.0  6.5 - 8.1 g/dL Final   Albumin 34/74/2595 4.2  3.5 - 5.0 g/dL Final   AST 40/98/1191 19  15 - 41 U/L Final   ALT 08/18/2022 15  0 - 44 U/L Final   Alkaline Phosphatase 08/18/2022 44  38 - 126 U/L Final   Total Bilirubin 08/18/2022 0.6  0.3 - 1.2 mg/dL Final   GFR, Estimated 08/18/2022 >60  >60 mL/min Final   Comment: (NOTE) Calculated using the CKD-EPI Creatinine Equation (2021)    Anion gap 08/18/2022 9  5 - 15 Final   Performed at [facility], [address] [place], [place] [address]   Alcohol, Ethyl (B) 08/18/2022 <10  <10 mg/dL Final   Comment: (NOTE) Lowest detectable limit for serum alcohol is 10 mg/dL.  For medical purposes only. Performed at [facility], [address] [place], [place] [address]    Salicylate Lvl 08/18/2022 <7.0 (L)  7.0 - 30.0 mg/dL Final   Performed at [facility], [address] [place],  [place] [address]   Acetaminophen (Tylenol), Serum 08/18/2022 <10 (L)  10 - 30 ug/mL Final   Comment: (NOTE) Therapeutic concentrations vary significantly. A range of 10-30 ug/mL  may be an effective concentration for many patients. However, some  are best treated at concentrations outside of this range. Acetaminophen concentrations >150 ug/mL at 4 hours after ingestion  and >50 ug/mL at 12 hours after ingestion are often associated with  toxic reactions.  Performed at [facility], [address] [place], [place] [address]    WBC 08/18/2022 5.7  4.0 - 10.5 K/uL Final   RBC 08/18/2022 4.87  4.22 - 5.81 MIL/uL Final   Hemoglobin 08/18/2022 14.4  13.0 - 17.0 g/dL Final   HCT 29/52/8413 43.7  39.0 - 52.0 % Final   MCV 08/18/2022 89.7  80.0 - 100.0 fL Final   MCH 08/18/2022 29.6  26.0 - 34.0 pg Final   MCHC 08/18/2022 33.0  30.0 - 36.0 g/dL Final   RDW 24/40/1027 13.2  11.5 - 15.5 % Final   Platelets 08/18/2022 190  150 - 400 K/uL Final   nRBC 08/18/2022 0.0  0.0 - 0.2 % Final   Performed at [facility], [address] [place], [place] [address]   Glucose-Capillary 08/19/2022 76  70 - 99 mg/dL Final   Glucose reference range applies only to samples taken after fasting for at least 8 hours.   Acetaminophen (Tylenol), Serum 08/18/2022 <10 (L)  10 - 30 ug/mL Final   Comment: (NOTE) Therapeutic concentrations vary significantly. A range of 10-30 ug/mL  may be an effective concentration for many patients. However, some  are best treated at concentrations outside of this range. Acetaminophen concentrations >150 ug/mL at 4 hours after ingestion  and >50 ug/mL at 12 hours after ingestion are often associated with  toxic reactions.  Performed at [facility], [address] [place], [place] [address]    Sodium 08/18/2022 138  135 - 145 mmol/L Final   Potassium 08/18/2022 4.2  3.5 - 5.1 mmol/L Final   Chloride 08/18/2022 106  98 - 111 mmol/L Final   CO2  08/18/2022 26  22 - 32 mmol/L Final   Glucose, Bld 08/18/2022 85  70 - 99 mg/dL Final   Glucose reference range applies only to samples taken after fasting for at least 8 hours.   BUN 08/18/2022 14  6 - 20 mg/dL Final   Creatinine, Ser 08/18/2022 0.90  0.61 -  1.24 mg/dL Final   Calcium 16/03/9603 8.7 (L)  8.9 - 10.3 mg/dL Final   Total Protein 54/02/8118 6.6  6.5 - 8.1 g/dL Final   Albumin 14/78/2956 3.8  3.5 - 5.0 g/dL Final   AST 21/30/8657 16  15 - 41 U/L Final   ALT 08/18/2022 16  0 - 44 U/L Final   Alkaline Phosphatase 08/18/2022 42  38 - 126 U/L Final   Total Bilirubin 08/18/2022 0.7  0.3 - 1.2 mg/dL Final   GFR, Estimated 08/18/2022 >60  >60 mL/min Final   Comment: (NOTE) Calculated using the CKD-EPI Creatinine Equation (2021)    Anion gap 08/18/2022 6  5 - 15 Final   Performed at [facility], [address] [place], [place] [address]   Glucose-Capillary 08/19/2022 82  70 - 99 mg/dL Final   Glucose reference range applies only to samples taken after fasting for at least 8 hours.   SARS Coronavirus 2 by RT PCR 08/19/2022 NEGATIVE  NEGATIVE Final   Comment: (NOTE) SARS-CoV-2 target nucleic acids are NOT DETECTED.  The SARS-CoV-2 RNA is generally detectable in upper respiratory specimens during the acute phase of infection. The lowest concentration of SARS-CoV-2 viral copies this assay can detect is 138 copies/mL. A negative result does not preclude SARS-Cov-2 infection and should not be used as the sole basis for treatment or other patient management decisions. A negative result may occur with  improper specimen collection/handling, submission of specimen other than nasopharyngeal swab, presence of viral mutation(s) within the areas targeted by this assay, and inadequate number of viral copies(<138 copies/mL). A negative result must be combined with clinical observations, patient history, and epidemiological information. The expected result is Negative.  Fact  Sheet for Patients:  BloggerCourse.com  Fact Sheet for Healthcare Providers:  SeriousBroker.it  This test is no                          t yet approved or cleared by the [place] FDA and  has been authorized for detection and/or diagnosis of SARS-CoV-2 by FDA under an Emergency Use Authorization (EUA). This EUA will remain  in effect (meaning this test can be used) for the duration of the COVID-19 declaration under Section 564(b)(1) of the Act, 21 U.S.C.section 360bbb-3(b)(1), unless the authorization is terminated  or revoked sooner.       Influenza A by PCR 08/19/2022 NEGATIVE  NEGATIVE Final   Influenza B by PCR 08/19/2022 NEGATIVE  NEGATIVE Final   Comment: (NOTE) The Xpert Xpress SARS-CoV-2/FLU/RSV plus assay is intended as an aid in the diagnosis of influenza from Nasopharyngeal swab specimens and should not be used as a sole basis for treatment. Nasal washings and aspirates are unacceptable for Xpert Xpress SARS-CoV-2/FLU/RSV testing.  Fact Sheet for Patients: BloggerCourse.com  Fact Sheet for Healthcare Providers: SeriousBroker.it  This test is not yet approved or cleared by the [place] FDA and has been authorized for detection and/or diagnosis of SARS-CoV-2 by FDA under an Emergency Use Authorization (EUA). This EUA will remain in effect (meaning this test can be used) for the duration of the COVID-19 declaration under Section 564(b)(1) of the Act, 21 U.S.C. section 360bbb-3(b)(1), unless the authorization is terminated or revoked.     Resp Syncytial Virus by PCR 08/19/2022 NEGATIVE  NEGATIVE Final   Comment: (NOTE) Fact Sheet for Patients: BloggerCourse.com  Fact Sheet for Healthcare Providers: SeriousBroker.it  This test is not yet approved or cleared by  the [employer] and has been authorized  for detection and/or diagnosis of SARS-CoV-2 by FDA under an Emergency Use Authorization (EUA). This EUA will remain in effect (meaning this test can be used) for the duration of the COVID-19 declaration under Section 564(b)(1) of the Act, 21 U.S.C. section 360bbb-3(b)(1), unless the authorization is terminated or revoked.  Performed at [facility], [address] [place], [place] [address]    Color, Urine 08/19/2022 YELLOW  YELLOW Final   APPearance 08/19/2022 CLEAR  CLEAR Final   Specific Gravity, Urine 08/19/2022 1.025  1.005 - 1.030 Final   pH 08/19/2022 6.0  5.0 - 8.0 Final   Glucose, UA 08/19/2022 NEGATIVE  NEGATIVE mg/dL Final   Hgb urine dipstick 08/19/2022 NEGATIVE  NEGATIVE Final   Bilirubin Urine 08/19/2022 NEGATIVE  NEGATIVE Final   Ketones, ur 08/19/2022 NEGATIVE  NEGATIVE mg/dL Final   Protein, ur 53/66/4403 NEGATIVE  NEGATIVE mg/dL Final   Nitrite 47/42/5956 NEGATIVE  NEGATIVE Final   Leukocytes,Ua 08/19/2022 NEGATIVE  NEGATIVE Final   Comment: Microscopic not done on urines with negative protein, blood, leukocytes, nitrite, or glucose < 500 mg/dL. Performed at [facility], [address] [place], [place] [address]   Admission on 08/18/2022, Discharged on 08/18/2022  Component Date Value Ref Range Status   Sodium 08/18/2022 137  135 - 145 mmol/L Final   Potassium 08/18/2022 3.7  3.5 - 5.1 mmol/L Final   Chloride 08/18/2022 107  98 - 111 mmol/L Final   CO2 08/18/2022 22  22 - 32 mmol/L Final   Glucose, Bld 08/18/2022 122 (H)  70 - 99 mg/dL Final   Glucose reference range applies only to samples taken after fasting for at least 8 hours.   BUN 08/18/2022 13  6 - 20 mg/dL Final   Creatinine, Ser 08/18/2022 0.94  0.61 - 1.24 mg/dL Final   Calcium 43/32/9518 8.3 (L)  8.9 - 10.3 mg/dL Final   Total Protein 84/16/6063 6.6  6.5 - 8.1 g/dL Final   Albumin 01/60/1093 3.8  3.5 - 5.0 g/dL Final   AST 23/55/7322 28  15 - 41 U/L Final   ALT 08/18/2022  12  0 - 44 U/L Final   Alkaline Phosphatase 08/18/2022 43  38 - 126 U/L Final   Total Bilirubin 08/18/2022 0.4  0.3 - 1.2 mg/dL Final   GFR, Estimated 08/18/2022 >60  >60 mL/min Final   Comment: (NOTE) Calculated using the CKD-EPI Creatinine Equation (2021)    Anion gap 08/18/2022 8  5 - 15 Final   Performed at [facility], [address] [place], [place] [address]   Alcohol, Ethyl (B) 08/18/2022 <10  <10 mg/dL Final   Comment: (NOTE) Lowest detectable limit for serum alcohol is 10 mg/dL.  For medical purposes only. Performed at [facility], [address] [place], [place] [address]    Salicylate Lvl 08/18/2022 <7.0 (L)  7.0 - 30.0 mg/dL Final   Performed at [facility], [address] [place], [place] [address]   Acetaminophen (Tylenol), Serum 08/18/2022 <10 (L)  10 - 30 ug/mL Final   Comment: (NOTE) Therapeutic concentrations vary significantly. A range of 10-30 ug/mL  may be an effective concentration for many patients. However, some  are best treated at concentrations outside of this range. Acetaminophen concentrations >150 ug/mL at 4 hours after ingestion  and >50 ug/mL at 12 hours after ingestion are often associated with  toxic  reactions.  Performed at [facility], [address] [place], [place] [address]    WBC 08/18/2022 5.8  4.0 - 10.5 K/uL Final   RBC 08/18/2022 4.71  4.22 - 5.81 MIL/uL Final   Hemoglobin 08/18/2022 13.9  13.0 - 17.0 g/dL Final   HCT 60/45/4098 42.6  39.0 - 52.0 % Final   MCV 08/18/2022 90.4  80.0 - 100.0 fL Final   MCH 08/18/2022 29.5  26.0 - 34.0 pg Final   MCHC 08/18/2022 32.6  30.0 - 36.0 g/dL Final   RDW 11/91/4782 13.1  11.5 - 15.5 % Final   Platelets 08/18/2022 183  150 - 400 K/uL Final   nRBC 08/18/2022 0.0  0.0 - 0.2 % Final   Performed at [facility], [address] [place], [place] [address]   Opiates 08/18/2022 NONE DETECTED  NONE DETECTED Final   Cocaine 08/18/2022  NONE DETECTED  NONE DETECTED Final   Benzodiazepines 08/18/2022 NONE DETECTED  NONE DETECTED Final   Amphetamines 08/18/2022 NONE DETECTED  NONE DETECTED Final   Tetrahydrocannabinol 08/18/2022 NONE DETECTED  NONE DETECTED Final   Barbiturates 08/18/2022 NONE DETECTED  NONE DETECTED Final   Comment: (NOTE) DRUG SCREEN FOR MEDICAL PURPOSES ONLY.  IF CONFIRMATION IS NEEDED FOR ANY PURPOSE, NOTIFY LAB WITHIN 5 DAYS.  LOWEST DETECTABLE LIMITS FOR URINE DRUG SCREEN Drug Class                     Cutoff (ng/mL) Amphetamine and metabolites    1000 Barbiturate and metabolites    200 Benzodiazepine                 200 Opiates and metabolites        300 Cocaine and metabolites        300 THC                            50 Performed at [facility], [address] [place], [place] [address]   Office Visit on 08/15/2022  Component Date Value Ref Range Status   Valproic Acid Lvl 08/15/2022 55  50 - 100 ug/mL Final   Comment:                                 Detection Limit = 4                            <4 indicates None Detected Toxicity may occur at levels of 100-500. Measurements of free unbound valproic acid may improve the assess- ment of clinical response.    Glucose 08/15/2022 107 (H)  70 - 99 mg/dL Final   BUN 78/46/9629 10  6 - 20 mg/dL Final   Creatinine, Ser 08/15/2022 0.89  0.76 - 1.27 mg/dL Final   eGFR 52/84/1324 123  >59 mL/min/1.73 Final   BUN/Creatinine Ratio 08/15/2022 11  9 - 20 Final   Sodium 08/15/2022 142  134 - 144 mmol/L Final   Potassium 08/15/2022 4.3  3.5 - 5.2 mmol/L Final   Chloride 08/15/2022 102  96 - 106 mmol/L Final   CO2 08/15/2022 22  20 - 29 mmol/L Final   Calcium 08/15/2022 9.6  8.7 - 10.2 mg/dL Final   Vit D, 40-NUUVOZD 08/15/2022 13.7 (L)  30.0 - 100.0 ng/mL Final  Comment: Vitamin D deficiency has been defined by the Institute of Medicine and an Endocrine Society practice guideline as a level of serum 25-OH vitamin D less than 20  ng/mL (1,2). The Endocrine Society went on to further define vitamin D insufficiency as a level between 21 and 29 ng/mL (2). 1. IOM (Institute of Medicine). 2010. Dietary reference    intakes for calcium and D. [place]: The    [employer]. 2. Holick MF, Binkley [place], Bischoff-Ferrari HA, et al.    Evaluation, treatment, and prevention of vitamin D    deficiency: an Endocrine Society clinical practice    guideline. JCEM. 2011 Jul; 96(7):1911-30.    TSH 08/15/2022 2.100  0.450 - 4.500 uIU/mL Final   T4, Total 08/15/2022 5.2  4.5 - 12.0 ug/dL Final   T3 Uptake Ratio 08/15/2022 28  24 - 39 % Final   Free Thyroxine Index 08/15/2022 1.5  1.2 - 4.9 Final   HCV Ab 08/15/2022 Non Reactive  Non Reactive Final   HIV Screen 4th Generation wRfx 08/15/2022 Non Reactive  Non Reactive Final   Comment: HIV Negative HIV-1/HIV-2 antibodies and HIV-1 p24 antigen were NOT detected. There is no laboratory evidence of HIV infection.    RPR Ser Ql 08/15/2022 Non Reactive  Non Reactive Final   Neisseria Gonorrhea 08/15/2022 Negative   Final   Chlamydia 08/15/2022 Negative   Final   Trichomonas 08/15/2022 Negative   Final   Comment 08/15/2022 Normal Reference Ranger Chlamydia - Negative   Final   Comment 08/15/2022 Normal Reference Range Neisseria Gonorrhea - Negative   Final   Comment 08/15/2022 Normal Reference Range Trichomonas - Negative   Final   HCV Interp 1: 08/15/2022 Comment   Final   Comment: Not infected with HCV unless early or acute infection is suspected (which may be delayed in an immunocompromised individual), or other evidence exists to indicate HCV infection.   Admission on 07/21/2022, Discharged on 07/22/2022  Component Date Value Ref Range Status   Sodium 07/21/2022 140  135 - 145 mmol/L Final   Potassium 07/21/2022 4.3  3.5 - 5.1 mmol/L Final   Chloride 07/21/2022 105  98 - 111 mmol/L Final   CO2 07/21/2022 25  22 - 32 mmol/L Final   Glucose, Bld 07/21/2022 127 (H)   70 - 99 mg/dL Final   Glucose reference range applies only to samples taken after fasting for at least 8 hours.   BUN 07/21/2022 11  6 - 20 mg/dL Final   Creatinine, Ser 07/21/2022 0.94  0.61 - 1.24 mg/dL Final   Calcium 96/09/5407 8.7 (L)  8.9 - 10.3 mg/dL Final   Total Protein 81/19/1478 6.0 (L)  6.5 - 8.1 g/dL Final   Albumin 29/56/2130 3.8  3.5 - 5.0 g/dL Final   AST 86/57/8469 18  15 - 41 U/L Final   ALT 07/21/2022 12  0 - 44 U/L Final   Alkaline Phosphatase 07/21/2022 46  38 - 126 U/L Final   Total Bilirubin 07/21/2022 0.3  0.3 - 1.2 mg/dL Final   GFR, Estimated 07/21/2022 >60  >60 mL/min Final   Comment: (NOTE) Calculated using the CKD-EPI Creatinine Equation (2021)    Anion gap 07/21/2022 10  5 - 15 Final   Performed at [facility], [address] [place], [place] [address]   Alcohol, Ethyl (B) 07/21/2022 <10  <10 mg/dL Final   Comment: (NOTE) Lowest detectable limit for serum alcohol is 10 mg/dL.  For medical purposes only. Performed  at [facility], [address], [place], [place] [address]    Salicylate Lvl 07/21/2022 <7.0 (L)  7.0 - 30.0 mg/dL Final   Performed at [facility], [address], [place], [place] [address]   Acetaminophen (Tylenol), Serum 07/21/2022 <10 (L)  10 - 30 ug/mL Final   Comment: (NOTE) Therapeutic concentrations vary significantly. A range of 10-30 ug/mL  may be an effective concentration for many patients. However, some  are best treated at concentrations outside of this range. Acetaminophen concentrations >150 ug/mL at 4 hours after ingestion  and >50 ug/mL at 12 hours after ingestion are often associated with  toxic reactions.  Performed at [facility], [address], [place], [place] [address]    WBC 07/21/2022 6.4  4.0 - 10.5 K/uL Final   RBC 07/21/2022 4.73  4.22 - 5.81 MIL/uL Final   Hemoglobin 07/21/2022 13.8  13.0 - 17.0 g/dL Final   HCT 29/56/2130 43.7  39.0 - 52.0 % Final   MCV 07/21/2022 92.4  80.0  - 100.0 fL Final   MCH 07/21/2022 29.2  26.0 - 34.0 pg Final   MCHC 07/21/2022 31.6  30.0 - 36.0 g/dL Final   RDW 86/57/8469 13.4  11.5 - 15.5 % Final   Platelets 07/21/2022 240  150 - 400 K/uL Final   nRBC 07/21/2022 0.0  0.0 - 0.2 % Final   Performed at [facility], [address] [place], [place] [address]   Opiates 07/21/2022 NONE DETECTED  NONE DETECTED Final   Cocaine 07/21/2022 NONE DETECTED  NONE DETECTED Final   Benzodiazepines 07/21/2022 NONE DETECTED  NONE DETECTED Final   Amphetamines 07/21/2022 NONE DETECTED  NONE DETECTED Final   Tetrahydrocannabinol 07/21/2022 NONE DETECTED  NONE DETECTED Final   Barbiturates 07/21/2022 NONE DETECTED  NONE DETECTED Final   Comment: (NOTE) DRUG SCREEN FOR MEDICAL PURPOSES ONLY.  IF CONFIRMATION IS NEEDED FOR ANY PURPOSE, NOTIFY LAB WITHIN 5 DAYS.  LOWEST DETECTABLE LIMITS FOR URINE DRUG SCREEN Drug Class                     Cutoff (ng/mL) Amphetamine and metabolites    1000 Barbiturate and metabolites    200 Benzodiazepine                 200 Opiates and metabolites        300 Cocaine and metabolites        300 THC                            50 Performed at [facility], [address] [place], [place] [address]    Lithium Lvl 07/21/2022 <0.06 (L)  0.60 - 1.20 mmol/L Final   Performed at [facility], [address] [place], [place] [address]   Sodium 07/21/2022 141  135 - 145 mmol/L Final   Potassium 07/21/2022 4.3  3.5 - 5.1 mmol/L Final   Chloride 07/21/2022 107  98 - 111 mmol/L Final   CO2 07/21/2022 26  22 - 32 mmol/L Final   Glucose, Bld 07/21/2022 94  70 - 99 mg/dL Final   Glucose reference range applies only to samples taken after fasting for at least 8 hours.   BUN 07/21/2022 12  6 - 20 mg/dL Final   Creatinine, Ser 07/21/2022 0.99  0.61 - 1.24 mg/dL Final   Calcium 27/25/3664 8.5 (L)  8.9 -  10.3 mg/dL Final   Total Protein 40/98/1191 5.4 (L)  6.5 - 8.1 g/dL Final   Albumin 47/82/9562 3.4 (L)   3.5 - 5.0 g/dL Final   AST 13/01/6577 18  15 - 41 U/L Final   ALT 07/21/2022 11  0 - 44 U/L Final   Alkaline Phosphatase 07/21/2022 38  38 - 126 U/L Final   Total Bilirubin 07/21/2022 0.6  0.3 - 1.2 mg/dL Final   GFR, Estimated 07/21/2022 >60  >60 mL/min Final   Comment: (NOTE) Calculated using the CKD-EPI Creatinine Equation (2021)    Anion gap 07/21/2022 8  5 - 15 Final   Performed at [facility], [address] [place], [place] [address]   Acetaminophen (Tylenol), Serum 07/21/2022 <10 (L)  10 - 30 ug/mL Final   Comment: (NOTE) Therapeutic concentrations vary significantly. A range of 10-30 ug/mL  may be an effective concentration for many patients. However, some  are best treated at concentrations outside of this range. Acetaminophen concentrations >150 ug/mL at 4 hours after ingestion  and >50 ug/mL at 12 hours after ingestion are often associated with  toxic reactions.  Performed at [facility], [address], [place], [place] [address]    Salicylate Lvl 07/21/2022 <7.0 (L)  7.0 - 30.0 mg/dL Final   Performed at [facility], [address], [place], [place] [address]   SARS Coronavirus 2 by RT PCR 07/22/2022 NEGATIVE  NEGATIVE Final   Influenza A by PCR 07/22/2022 NEGATIVE  NEGATIVE Final   Influenza B by PCR 07/22/2022 NEGATIVE  NEGATIVE Final   Comment: (NOTE) The Xpert Xpress SARS-CoV-2/FLU/RSV plus assay is intended as an aid in the diagnosis of influenza from Nasopharyngeal swab specimens and should not be used as a sole basis for treatment. Nasal washings and aspirates are unacceptable for Xpert Xpress SARS-CoV-2/FLU/RSV testing.  Fact Sheet for Patients: BloggerCourse.com  Fact Sheet for Healthcare Providers: SeriousBroker.it  This test is not yet approved or cleared by the [place] FDA and has been authorized for detection and/or diagnosis of SARS-CoV-2 by FDA under an Emergency Use  Authorization (EUA). This EUA will remain in effect (meaning this test can be used) for the duration of the COVID-19 declaration under Section 564(b)(1) of the Act, 21 U.S.C. section 360bbb-3(b)(1), unless the authorization is terminated or revoked.     Resp Syncytial Virus by PCR 07/22/2022 NEGATIVE  NEGATIVE Final   Comment: (NOTE) Fact Sheet for Patients: BloggerCourse.com  Fact Sheet for Healthcare Providers: SeriousBroker.it  This test is not yet approved or cleared by the [place] FDA and has been authorized for detection and/or diagnosis of SARS-CoV-2 by FDA under an Emergency Use Authorization (EUA). This EUA will remain in effect (meaning this test can be used) for the duration of the COVID-19 declaration under Section 564(b)(1) of the Act, 21 U.S.C. section 360bbb-3(b)(1), unless the authorization is terminated or revoked.  Performed at [facility], [address] [place], [place] [address]   Admission on 07/20/2022, Discharged on 07/21/2022  Component Date Value Ref Range Status   Sodium 07/20/2022 135  135 - 145 mmol/L Final   Potassium 07/20/2022 3.7  3.5 - 5.1 mmol/L Final   Chloride 07/20/2022 102  98 - 111 mmol/L Final   CO2 07/20/2022 25  22 - 32 mmol/L Final   Glucose, Bld 07/20/2022 164 (H)  70 - 99 mg/dL Final   Glucose reference range applies only to samples taken after fasting for at  least 8 hours.   BUN 07/20/2022 18  6 - 20 mg/dL Final   Creatinine, Ser 07/20/2022 1.06  0.61 - 1.24 mg/dL Final   Calcium 40/98/1191 8.4 (L)  8.9 - 10.3 mg/dL Final   Total Protein 47/82/9562 6.9  6.5 - 8.1 g/dL Final   Albumin 13/01/6577 4.2  3.5 - 5.0 g/dL Final   AST 46/96/2952 24  15 - 41 U/L Final   ALT 07/20/2022 12  0 - 44 U/L Final   Alkaline Phosphatase 07/20/2022 47  38 - 126 U/L Final   Total Bilirubin 07/20/2022 0.5  0.3 - 1.2 mg/dL Final   GFR, Estimated 07/20/2022 >60  >60 mL/min Final    Comment: (NOTE) Calculated using the CKD-EPI Creatinine Equation (2021)    Anion gap 07/20/2022 8  5 - 15 Final   Performed at [facility], [address] [place], [place] [address]   Alcohol, Ethyl (B) 07/20/2022 <10  <10 mg/dL Final   Comment: (NOTE) Lowest detectable limit for serum alcohol is 10 mg/dL.  For medical purposes only. Performed at [facility], [address] [place], [place] [address]    Salicylate Lvl 07/20/2022 <7.0 (L)  7.0 - 30.0 mg/dL Final   Performed at [facility], [address] [place], [place] [address]   Acetaminophen (Tylenol), Serum 07/20/2022 <10 (L)  10 - 30 ug/mL Final   Comment: (NOTE) Therapeutic concentrations vary significantly. A range of 10-30 ug/mL  may be an effective concentration for many patients. However, some  are best treated at concentrations outside of this range. Acetaminophen concentrations >150 ug/mL at 4 hours after ingestion  and >50 ug/mL at 12 hours after ingestion are often associated with  toxic reactions.  Performed at [facility], [address] [place], [place] [address]    WBC 07/20/2022 10.9 (H)  4.0 - 10.5 K/uL Final   RBC 07/20/2022 4.68  4.22 - 5.81 MIL/uL Final   Hemoglobin 07/20/2022 13.8  13.0 - 17.0 g/dL Final   HCT 34/74/2595 41.9  39.0 - 52.0 % Final   MCV 07/20/2022 89.5  80.0 - 100.0 fL Final   MCH 07/20/2022 29.5  26.0 - 34.0 pg Final   MCHC 07/20/2022 32.9  30.0 - 36.0 g/dL Final   RDW 63/87/5643 13.5  11.5 - 15.5 % Final   Platelets 07/20/2022 261  150 - 400 K/uL Final   nRBC 07/20/2022 0.0  0.0 - 0.2 % Final   Performed at [facility], [address] [place], [place] [address]   Opiates 07/20/2022 NONE DETECTED  NONE DETECTED Final   Cocaine 07/20/2022 NONE DETECTED  NONE DETECTED Final   Benzodiazepines 07/20/2022 NONE DETECTED  NONE DETECTED Final   Amphetamines 07/20/2022 NONE DETECTED  NONE DETECTED Final   Tetrahydrocannabinol  07/20/2022 NONE DETECTED  NONE DETECTED Final   Barbiturates 07/20/2022 NONE DETECTED  NONE DETECTED Final   Comment: (NOTE) DRUG SCREEN FOR MEDICAL PURPOSES ONLY.  IF CONFIRMATION IS NEEDED FOR ANY PURPOSE, NOTIFY LAB WITHIN 5 DAYS.  LOWEST DETECTABLE LIMITS FOR URINE DRUG SCREEN Drug Class                     Cutoff (ng/mL) Amphetamine and metabolites    1000 Barbiturate and metabolites    200 Benzodiazepine                 200 Opiates and metabolites  300 Cocaine and metabolites        300 THC                            50 Performed at [facility], [address] [place], [place] [address]    Lithium Lvl 07/21/2022 <0.06 (L)  0.60 - 1.20 mmol/L Final   Performed at [facility], [address] [place], [place] [address]  Admission on 07/04/2022, Discharged on 07/07/2022  Component Date Value Ref Range Status   SARS Coronavirus 2 by RT PCR 07/04/2022 NEGATIVE  NEGATIVE Final   Comment: (NOTE) SARS-CoV-2 target nucleic acids are NOT DETECTED.  The SARS-CoV-2 RNA is generally detectable in upper respiratory specimens during the acute phase of infection. The lowest concentration of SARS-CoV-2 viral copies this assay can detect is 138 copies/mL. A negative result does not preclude SARS-Cov-2 infection and should not be used as the sole basis for treatment or other patient management decisions. A negative result may occur with  improper specimen collection/handling, submission of specimen other than nasopharyngeal swab, presence of viral mutation(s) within the areas targeted by this assay, and inadequate number of viral copies(<138 copies/mL). A negative result must be combined with clinical observations, patient history, and epidemiological information. The expected result is Negative.  Fact Sheet for Patients:  BloggerCourse.com  Fact Sheet for Healthcare Providers:  SeriousBroker.it  This test is  no                          t yet approved or cleared by the [place] FDA and  has been authorized for detection and/or diagnosis of SARS-CoV-2 by FDA under an Emergency Use Authorization (EUA). This EUA will remain  in effect (meaning this test can be used) for the duration of the COVID-19 declaration under Section 564(b)(1) of the Act, 21 U.S.C.section 360bbb-3(b)(1), unless the authorization is terminated  or revoked sooner.       Influenza A by PCR 07/04/2022 NEGATIVE  NEGATIVE Final   Influenza B by PCR 07/04/2022 NEGATIVE  NEGATIVE Final   Comment: (NOTE) The Xpert Xpress SARS-CoV-2/FLU/RSV plus assay is intended as an aid in the diagnosis of influenza from Nasopharyngeal swab specimens and should not be used as a sole basis for treatment. Nasal washings and aspirates are unacceptable for Xpert Xpress SARS-CoV-2/FLU/RSV testing.  Fact Sheet for Patients: BloggerCourse.com  Fact Sheet for Healthcare Providers: SeriousBroker.it  This test is not yet approved or cleared by the [place] FDA and has been authorized for detection and/or diagnosis of SARS-CoV-2 by FDA under an Emergency Use Authorization (EUA). This EUA will remain in effect (meaning this test can be used) for the duration of the COVID-19 declaration under Section 564(b)(1) of the Act, 21 U.S.C. section 360bbb-3(b)(1), unless the authorization is terminated or revoked.     Resp Syncytial Virus by PCR 07/04/2022 NEGATIVE  NEGATIVE Final   Comment: (NOTE) Fact Sheet for Patients: BloggerCourse.com  Fact Sheet for Healthcare Providers: SeriousBroker.it  This test is not yet approved or cleared by the [place] FDA and has been authorized for detection and/or diagnosis of SARS-CoV-2 by FDA under an Emergency Use Authorization (EUA). This EUA will remain in effect (meaning this test can be used) for  the duration of the COVID-19 declaration under Section 564(b)(1) of the Act, 21 U.S.C. section 360bbb-3(b)(1), unless the authorization is terminated or revoked.  Performed at [facility], [address] [place], [place] [address]    WBC 07/04/2022 10.2  4.0 - 10.5 K/uL Final   RBC 07/04/2022 4.66  4.22 - 5.81 MIL/uL Final   Hemoglobin 07/04/2022 13.8  13.0 - 17.0 g/dL Final   HCT 60/45/409801/22/2024 41.0  39.0 - 52.0 % Final   MCV 07/04/2022 88.0  80.0 - 100.0 fL Final   MCH 07/04/2022 29.6  26.0 - 34.0 pg Final   MCHC 07/04/2022 33.7  30.0 - 36.0 g/dL Final   RDW 11/91/478201/22/2024 14.0  11.5 - 15.5 % Final   Platelets 07/04/2022 259  150 - 400 K/uL Final   nRBC 07/04/2022 0.0  0.0 - 0.2 % Final   Neutrophils Relative % 07/04/2022 57  % Final   Neutro Abs 07/04/2022 5.9  1.7 - 7.7 K/uL Final   Lymphocytes Relative 07/04/2022 31  % Final   Lymphs Abs 07/04/2022 3.2  0.7 - 4.0 K/uL Final   Monocytes Relative 07/04/2022 8  % Final   Monocytes Absolute 07/04/2022 0.8  0.1 - 1.0 K/uL Final   Eosinophils Relative 07/04/2022 2  % Final   Eosinophils Absolute 07/04/2022 0.2  0.0 - 0.5 K/uL Final   Basophils Relative 07/04/2022 1  % Final   Basophils Absolute 07/04/2022 0.1  0.0 - 0.1 K/uL Final   Immature Granulocytes 07/04/2022 1  % Final   Abs Immature Granulocytes 07/04/2022 0.11 (H)  0.00 - 0.07 K/uL Final   Performed at [facility], [address] [place], [place] [address]   Sodium 07/04/2022 139  135 - 145 mmol/L Final   Potassium 07/04/2022 3.4 (L)  3.5 - 5.1 mmol/L Final   Chloride 07/04/2022 102  98 - 111 mmol/L Final   CO2 07/04/2022 30  22 - 32 mmol/L Final   Glucose, Bld 07/04/2022 71  70 - 99 mg/dL Final   Glucose reference range applies only to samples taken after fasting for at least 8 hours.   BUN 07/04/2022 12  6 - 20 mg/dL Final   Creatinine, Ser 07/04/2022 0.95  0.61 - 1.24 mg/dL Final   Calcium 30/86/578401/22/2024 9.0  8.9 - 10.3 mg/dL Final   Total Protein 69/62/952801/22/2024 6.7   6.5 - 8.1 g/dL Final   Albumin 41/32/440101/22/2024 4.2  3.5 - 5.0 g/dL Final   AST 02/72/536601/22/2024 21  15 - 41 U/L Final   ALT 07/04/2022 17  0 - 44 U/L Final   Alkaline Phosphatase 07/04/2022 44  38 - 126 U/L Final   Total Bilirubin 07/04/2022 0.3  0.3 - 1.2 mg/dL Final   GFR, Estimated 07/04/2022 >60  >60 mL/min Final   Comment: (NOTE) Calculated using the CKD-EPI Creatinine Equation (2021)    Anion gap 07/04/2022 7  5 - 15 Final   Performed at [facility], [address] [place], [place] [address]   Hgb A1c MFr Bld 07/04/2022 4.9  4.8 - 5.6 % Final   Comment: (NOTE) Pre diabetes:          5.7%-6.4%  Diabetes:              >6.4%  Glycemic control for   <7.0% adults with diabetes    Mean Plasma Glucose 07/04/2022 93.93  mg/dL Final   Performed at [facility], [address] [place], [place] [address]   POC Amphetamine UR 07/04/2022 None Detected  NONE DETECTED (Cut Off Level 1000 ng/mL) Final   POC Secobarbital (BAR) 07/04/2022 None Detected  NONE  DETECTED (Cut Off Level 300 ng/mL) Final   POC Buprenorphine (BUP) 07/04/2022 None Detected  NONE DETECTED (Cut Off Level 10 ng/mL) Final   POC Oxazepam (BZO) 07/04/2022 None Detected  NONE DETECTED (Cut Off Level 300 ng/mL) Final   POC Cocaine UR 07/04/2022 None Detected  NONE DETECTED (Cut Off Level 300 ng/mL) Final   POC Methamphetamine UR 07/04/2022 None Detected  NONE DETECTED (Cut Off Level 1000 ng/mL) Final   POC Morphine 07/04/2022 None Detected  NONE DETECTED (Cut Off Level 300 ng/mL) Final   POC Methadone UR 07/04/2022 None Detected  NONE DETECTED (Cut Off Level 300 ng/mL) Final   POC Oxycodone UR 07/04/2022 None Detected  NONE DETECTED (Cut Off Level 100 ng/mL) Final   POC Marijuana UR 07/04/2022 None Detected  NONE DETECTED (Cut Off Level 50 ng/mL) Final   Lithium Lvl 07/04/2022 0.49 (L)  0.60 - 1.20 mmol/L Final   Performed at [facility], [address] [place], [place] [address]   Valproic Acid Lvl 07/04/2022 52   50.0 - 100.0 ug/mL Final   Performed at [facility], [address], [place], [place] [address]   SARSCOV2ONAVIRUS 2 AG 07/04/2022 NEGATIVE  NEGATIVE Final   Comment: (NOTE) SARS-CoV-2 antigen NOT DETECTED.   Negative results are presumptive.  Negative results do not preclude SARS-CoV-2 infection and should not be used as the sole basis for treatment or other patient management decisions, including infection  control decisions, particularly in the presence of clinical signs and  symptoms consistent with COVID-19, or in those who have been in contact with the virus.  Negative results must be combined with clinical observations, patient history, and epidemiological information. The expected result is Negative.  Fact Sheet for Patients: https://www.jennings-kim.com/  Fact Sheet for Healthcare Providers: https://alexander-rogers.biz/  This test is not yet approved or cleared by the [place] FDA and  has been authorized for detection and/or diagnosis of SARS-CoV-2 by FDA under an Emergency Use Authorization (EUA).  This EUA will remain in effect (meaning this test can be used) for the duration of  the COV                          ID-19 declaration under Section 564(b)(1) of the Act, 21 U.S.C. section 360bbb-3(b)(1), unless the authorization is terminated or revoked sooner.     Cholesterol 07/04/2022 150  0 - 200 mg/dL Final   Triglycerides 20/25/4270 106  <150 mg/dL Final   HDL 62/37/6283 50  >40 mg/dL Final   Total CHOL/HDL Ratio 07/04/2022 3.0  RATIO Final   VLDL 07/04/2022 21  0 - 40 mg/dL Final   LDL Cholesterol 07/04/2022 79  0 - 99 mg/dL Final   Comment:        Total Cholesterol/HDL:CHD Risk Coronary Heart Disease Risk Table                     Men   Women  1/2 Average Risk   3.4   3.3  Average Risk       5.0   4.4  2 X Average Risk   9.6   7.1  3 X Average Risk  23.4   11.0        Use the calculated Patient Ratio above and the CHD  Risk Table to determine the patient's CHD Risk.        ATP III CLASSIFICATION (LDL):  <100     mg/dL  Optimal  100-129  mg/dL   Near or Above                    Optimal  130-159  mg/dL   Borderline  161-096  mg/dL   High  >045     mg/dL   Very High Performed at [facility], [address] [place], [place] [address]    TSH 07/04/2022 5.790 (H)  0.350 - 4.500 uIU/mL Final   Comment: Performed by a 3rd Generation assay with a functional sensitivity of <=0.01 uIU/mL. Performed at [facility], [address] [place], [place] [address]    T3, Free 07/04/2022 3.6  2.0 - 4.4 pg/mL Final   Comment: (NOTE) Performed At: [facility] [address] [place], [place] [id number] Jolene Schimke MD QM:[id number]    Free T4 07/04/2022 0.65  0.61 - 1.12 ng/dL Final   Comment: (NOTE) Biotin ingestion may interfere with free T4 tests. If the results are inconsistent with the TSH level, previous test results, or the clinical presentation, then consider biotin interference. If needed, order repeat testing after stopping biotin. Performed at [facility], [address], [place], [place] [address]   Admission on 06/03/2022, Discharged on 06/07/2022  Component Date Value Ref Range Status   Sodium 06/03/2022 137  135 - 145 mmol/L Final   Potassium 06/03/2022 3.5  3.5 - 5.1 mmol/L Final   Chloride 06/03/2022 107  98 - 111 mmol/L Final   CO2 06/03/2022 22  22 - 32 mmol/L Final   Glucose, Bld 06/03/2022 99  70 - 99 mg/dL Final   Glucose reference range applies only to samples taken after fasting for at least 8 hours.   BUN 06/03/2022 7  6 - 20 mg/dL Final   Creatinine, Ser 06/03/2022 0.93  0.61 - 1.24 mg/dL Final   Calcium 24/40/1027 9.1  8.9 - 10.3 mg/dL Final   Total Protein 25/36/6440 7.4  6.5 - 8.1 g/dL Final   Albumin 34/74/2595 4.4  3.5 - 5.0 g/dL Final   AST 63/87/5643 19  15 - 41 U/L Final   ALT 06/03/2022 13  0 - 44 U/L Final   Alkaline Phosphatase 06/03/2022  57  38 - 126 U/L Final   Total Bilirubin 06/03/2022 0.9  0.3 - 1.2 mg/dL Final   GFR, Estimated 06/03/2022 >60  >60 mL/min Final   Comment: (NOTE) Calculated using the CKD-EPI Creatinine Equation (2021)    Anion gap 06/03/2022 8  5 - 15 Final   Performed at [facility], [address] [place], [place] [address]   Alcohol, Ethyl (B) 06/03/2022 <10  <10 mg/dL Final   Comment: (NOTE) Lowest detectable limit for serum alcohol is 10 mg/dL.  For medical purposes only. Performed at [facility], [address] [place], [place] [address]    Salicylate Lvl 06/03/2022 <7.0 (L)  7.0 - 30.0 mg/dL Final   Performed at [facility], [address] [place], [place] [address]   Acetaminophen (Tylenol), Serum 06/03/2022 <10 (L)  10 - 30 ug/mL Final   Comment: (NOTE) Therapeutic concentrations vary significantly. A range of 10-30 ug/mL  may be an effective concentration for many patients. However, some  are best treated at concentrations outside of this range. Acetaminophen concentrations >150 ug/mL at 4 hours after ingestion  and >50 ug/mL at 12 hours after ingestion are often associated with  toxic reactions.  Performed at [facility], [address] [place], [place] [address]    WBC 06/03/2022 8.5  4.0 - 10.5 K/uL Final   RBC 06/03/2022 5.11  4.22 - 5.81 MIL/uL Final   Hemoglobin 06/03/2022 14.8  13.0 - 17.0 g/dL Final   HCT 60/45/4098 43.8  39.0 - 52.0 % Final   MCV 06/03/2022 85.7  80.0 - 100.0 fL Final   MCH 06/03/2022 29.0  26.0 - 34.0 pg Final   MCHC 06/03/2022 33.8  30.0 - 36.0 g/dL Final   RDW 11/91/4782 14.0  11.5 - 15.5 % Final   Platelets 06/03/2022 249  150 - 400 K/uL Final   nRBC 06/03/2022 0.0  0.0 - 0.2 % Final   Performed at [facility], [address] [place], [place] [address]   Opiates 06/03/2022 NONE DETECTED  NONE DETECTED Final   Cocaine 06/03/2022 NONE DETECTED  NONE DETECTED Final   Benzodiazepines  06/03/2022 NONE DETECTED  NONE DETECTED Final   Amphetamines 06/03/2022 POSITIVE (A)  NONE DETECTED Final   Tetrahydrocannabinol 06/03/2022 POSITIVE (A)  NONE DETECTED Final   Barbiturates 06/03/2022 NONE DETECTED  NONE DETECTED Final   Comment: (NOTE) DRUG SCREEN FOR MEDICAL PURPOSES ONLY.  IF CONFIRMATION IS NEEDED FOR ANY PURPOSE, NOTIFY LAB WITHIN 5 DAYS.  LOWEST DETECTABLE LIMITS FOR URINE DRUG SCREEN Drug Class                     Cutoff (ng/mL) Amphetamine and metabolites    1000 Barbiturate and metabolites    200 Benzodiazepine                 200 Opiates and metabolites        300 Cocaine and metabolites        300 THC                            50 Performed at [facility], [address] [place], [place] [address]    SARS Coronavirus 2 by RT PCR 06/03/2022 NEGATIVE  NEGATIVE Final   Comment: (NOTE) SARS-CoV-2 target nucleic acids are NOT DETECTED.  The SARS-CoV-2 RNA is generally detectable in upper respiratory specimens during the acute phase of infection. The lowest concentration of SARS-CoV-2 viral copies this assay can detect is 138 copies/mL. A negative result does not preclude SARS-Cov-2 infection and should not be used as the sole basis for treatment or other patient management decisions. A negative result may occur with  improper specimen collection/handling, submission of specimen other than nasopharyngeal swab, presence of viral mutation(s) within the areas targeted by this assay, and inadequate number of viral copies(<138 copies/mL). A negative result must be combined with clinical observations, patient history, and epidemiological information. The expected result is Negative.  Fact Sheet for Patients:  BloggerCourse.com  Fact Sheet for Healthcare Providers:  SeriousBroker.it  This test is no                          t yet approved or cleared by the [place] FDA and  has been  authorized for detection and/or diagnosis of SARS-CoV-2 by FDA under an Emergency Use Authorization (EUA). This EUA will remain  in effect (meaning this test can be used) for the duration of the COVID-19 declaration under Section 564(b)(1) of the Act, 21 U.S.C.section 360bbb-3(b)(1), unless the authorization is terminated  or revoked sooner.  Influenza A by PCR 06/03/2022 NEGATIVE  NEGATIVE Final   Influenza B by PCR 06/03/2022 NEGATIVE  NEGATIVE Final   Comment: (NOTE) The Xpert Xpress SARS-CoV-2/FLU/RSV plus assay is intended as an aid in the diagnosis of influenza from Nasopharyngeal swab specimens and should not be used as a sole basis for treatment. Nasal washings and aspirates are unacceptable for Xpert Xpress SARS-CoV-2/FLU/RSV testing.  Fact Sheet for Patients: BloggerCourse.com  Fact Sheet for Healthcare Providers: SeriousBroker.it  This test is not yet approved or cleared by the [place] FDA and has been authorized for detection and/or diagnosis of SARS-CoV-2 by FDA under an Emergency Use Authorization (EUA). This EUA will remain in effect (meaning this test can be used) for the duration of the COVID-19 declaration under Section 564(b)(1) of the Act, 21 U.S.C. section 360bbb-3(b)(1), unless the authorization is terminated or revoked.     Resp Syncytial Virus by PCR 06/03/2022 NEGATIVE  NEGATIVE Final   Comment: (NOTE) Fact Sheet for Patients: BloggerCourse.com  Fact Sheet for Healthcare Providers: SeriousBroker.it  This test is not yet approved or cleared by the [place] FDA and has been authorized for detection and/or diagnosis of SARS-CoV-2 by FDA under an Emergency Use Authorization (EUA). This EUA will remain in effect (meaning this test can be used) for the duration of the COVID-19 declaration under Section 564(b)(1) of the Act, 21  U.S.C. section 360bbb-3(b)(1), unless the authorization is terminated or revoked.  Performed at [facility], [address] [place], [place] [address]   Admission on 04/26/2022, Discharged on 04/27/2022  Component Date Value Ref Range Status   WBC 04/26/2022 9.3  4.0 - 10.5 K/uL Final   RBC 04/26/2022 5.25  4.22 - 5.81 MIL/uL Final   Hemoglobin 04/26/2022 15.4  13.0 - 17.0 g/dL Final   HCT 91/47/8295 46.4  39.0 - 52.0 % Final   MCV 04/26/2022 88.4  80.0 - 100.0 fL Final   MCH 04/26/2022 29.3  26.0 - 34.0 pg Final   MCHC 04/26/2022 33.2  30.0 - 36.0 g/dL Final   RDW 62/13/0865 14.0  11.5 - 15.5 % Final   Platelets 04/26/2022 250  150 - 400 K/uL Final   nRBC 04/26/2022 0.0  0.0 - 0.2 % Final   Neutrophils Relative % 04/26/2022 47  % Final   Neutro Abs 04/26/2022 4.3  1.7 - 7.7 K/uL Final   Lymphocytes Relative 04/26/2022 46  % Final   Lymphs Abs 04/26/2022 4.3 (H)  0.7 - 4.0 K/uL Final   Monocytes Relative 04/26/2022 6  % Final   Monocytes Absolute 04/26/2022 0.5  0.1 - 1.0 K/uL Final   Eosinophils Relative 04/26/2022 1  % Final   Eosinophils Absolute 04/26/2022 0.1  0.0 - 0.5 K/uL Final   Basophils Relative 04/26/2022 0  % Final   Basophils Absolute 04/26/2022 0.0  0.0 - 0.1 K/uL Final   Immature Granulocytes 04/26/2022 0  % Final   Abs Immature Granulocytes 04/26/2022 0.02  0.00 - 0.07 K/uL Final   Performed at [facility], [address], [place], [place] [address]   Sodium 04/26/2022 140  135 - 145 mmol/L Final   Potassium 04/26/2022 3.8  3.5 - 5.1 mmol/L Final   Chloride 04/26/2022 105  98 - 111 mmol/L Final   CO2 04/26/2022 29  22 - 32 mmol/L Final   Glucose, Bld 04/26/2022 86  70 - 99 mg/dL Final   Glucose reference range applies only to samples taken after fasting for at  least 8 hours.   BUN 04/26/2022 10  6 - 20 mg/dL Final   Creatinine, Ser 04/26/2022 1.02  0.61 - 1.24 mg/dL Final   Calcium 16/03/9603 9.5  8.9 - 10.3 mg/dL Final    Total Protein 04/26/2022 7.0  6.5 - 8.1 g/dL Final   Albumin 54/02/8118 4.5  3.5 - 5.0 g/dL Final   AST 14/78/2956 15  15 - 41 U/L Final   ALT 04/26/2022 14  0 - 44 U/L Final   Alkaline Phosphatase 04/26/2022 48  38 - 126 U/L Final   Total Bilirubin 04/26/2022 0.6  0.3 - 1.2 mg/dL Final   GFR, Estimated 04/26/2022 >60  >60 mL/min Final   Comment: (NOTE) Calculated using the CKD-EPI Creatinine Equation (2021)    Anion gap 04/26/2022 6  5 - 15 Final   Performed at [facility], [address], [place], [place] [address]   Alcohol, Ethyl (B) 04/26/2022 <10  <10 mg/dL Final   Comment: (NOTE) Lowest detectable limit for serum alcohol is 10 mg/dL.  For medical purposes only. Performed at [facility], [address] [place], [place] [address]    Opiates 04/26/2022 NONE DETECTED  NONE DETECTED Final   Cocaine 04/26/2022 NONE DETECTED  NONE DETECTED Final   Benzodiazepines 04/26/2022 NONE DETECTED  NONE DETECTED Final   Amphetamines 04/26/2022 POSITIVE (A)  NONE DETECTED Final   Tetrahydrocannabinol 04/26/2022 POSITIVE (A)  NONE DETECTED Final   Barbiturates 04/26/2022 NONE DETECTED  NONE DETECTED Final   Comment: (NOTE) DRUG SCREEN FOR MEDICAL PURPOSES ONLY.  IF CONFIRMATION IS NEEDED FOR ANY PURPOSE, NOTIFY LAB WITHIN 5 DAYS.  LOWEST DETECTABLE LIMITS FOR URINE DRUG SCREEN Drug Class                     Cutoff (ng/mL) Amphetamine and metabolites    1000 Barbiturate and metabolites    200 Benzodiazepine                 200 Opiates and metabolites        300 Cocaine and metabolites        300 THC                            50 Performed at [facility], [address], [place], [place] [address]     Allergies: Amoxicillin, Penicillins, Other, Pertussis vaccines, Tape, and Blue mussel [mytilus]  Medications:  Facility Ordered Medications  Medication   acetaminophen (TYLENOL) tablet 650 mg   alum & mag hydroxide-simeth  (MAALOX/MYLANTA) 200-200-20 MG/5ML suspension 30 mL   magnesium hydroxide (MILK OF MAGNESIA) suspension 30 mL   hydrOXYzine (ATARAX) tablet 25 mg   traZODone (DESYREL) tablet 50 mg   OLANZapine (ZYPREXA) tablet 15 mg   OLANZapine (ZYPREXA) tablet 5 mg   prazosin (MINIPRESS) capsule 2 mg   [COMPLETED] benzocaine (ORAJEL) 10 % mucosal gel 1 Application   PTA Medications  Medication Sig   albuterol (VENTOLIN HFA) 108 (90 Base) MCG/ACT inhaler Inhale 2 puffs into the lungs every 6 (six) hours as needed for wheezing or shortness of breath.   prazosin (MINIPRESS) 2 MG capsule Take 2 mg by mouth at bedtime.   QUEtiapine (SEROQUEL) 100 MG tablet Take 100 mg by mouth at bedtime.   Vitamin D, Ergocalciferol, (DRISDOL) 1.25 MG (50000 UNIT) CAPS capsule Take 1 capsule (50,000 Units total) by mouth every 7 (seven) days. (Patient not taking: Reported on  09/16/2022)   QUEtiapine (SEROQUEL) 200 MG tablet Take 200 mg by mouth every 8 (eight) hours.   hydrOXYzine (VISTARIL) 25 MG capsule Take 25 mg by mouth every 6 (six) hours as needed for anxiety.   lithium carbonate (LITHOBID) 300 MG ER tablet Take 300 mg by mouth 3 (three) times daily.   acetaminophen (TYLENOL) 500 MG tablet Take 1,000 mg by mouth every 6 (six) hours as needed for mild pain or headache.   OLANZapine (ZYPREXA) 15 MG tablet Take 15 mg by mouth at bedtime.   OLANZapine (ZYPREXA) 5 MG tablet Take 5 mg by mouth in the morning.    Long Term Goals: Improvement in symptoms so as ready for discharge  Short Term Goals: Patient will verbalize feelings in meetings with treatment team members., Patient will attend at least of 50% of the groups daily., Pt will complete the PHQ9 on admission, day 3 and discharge., Patient will participate in completing the [place] Suicide Severity Rating Scale, Patient will score a low risk of violence for 24 hours prior to discharge, and Patient will take medications as prescribed daily.  Medical Decision Making   Patient meets criteria for treatment in the [facility].  He is seeking a long-term residential substance abuse program    Recommendations  Based on my evaluation the patient does not appear to have an emergency medical condition.  Patient will be admitted to the facility base crisis unit.  Social work can help seek residential substance abuse treatment programs.  No medication changes at this time  Ardis Hughs, NP 09/19/22  6:05 PM

## 2022-09-19 NOTE — ED Notes (Signed)
Pt transferred from Observiation unit to [facility] requesting detox from Meth and ETOH. Pt endorse passive SI, no plan or intent. Pt states, "I really just need to come off meth completely. I last used in January. I've already been accepted to [facility] but they want me to detox first. I feel suicidal at times. It comes and go as passing thoughts". At current, pt denies SI. Calm, cooperative throughout interview process. Skin assessment completed. Oriented to unit. Meal and drink offered. Pt unable to contract for safety if discharged. Will monitor for safety.

## 2022-09-19 NOTE — Care Management (Addendum)
OBS Care Management   Writer spoke to [place] [facility] [phone].  Per Morrell Riddle, that patient has not been accepted to [facility] until the referral packet hsa been reviewed.   Writer faxed the referral packet to [facility] [phone]. Writer informed the RN and the NP..  11:29pm  Writer left a voice mail message regarding the status of the referral for placement at [facility] for the patient.   12:08pm Writer spoke to [employer] at [place] [phone].  Lupita Leash reports that the referral has been received and the nurse has not reviewed the referral as of yet.  Lupita Leash reports that the nurse will contact me once a final decision regarding the patient has been determined.

## 2022-09-19 NOTE — ED Notes (Signed)
Patient observed/assessed at nursing station. Patient alert and oriented x 4. Affect is flat. Patient denies pain and anxiety. He denies A/V/H. He denies having any thoughts/plan of self harm and harm towards others. Fluid and snack offered. Patient states that appetite has been good throughout the day. Verbalizes no further complaints at this time. Will continue to monitor and support.  

## 2022-09-20 DIAGNOSIS — R440 Auditory hallucinations: Secondary | ICD-10-CM | POA: Diagnosis not present

## 2022-09-20 DIAGNOSIS — F32A Depression, unspecified: Secondary | ICD-10-CM | POA: Diagnosis not present

## 2022-09-20 DIAGNOSIS — Z9151 Personal history of suicidal behavior: Secondary | ICD-10-CM | POA: Diagnosis not present

## 2022-09-20 DIAGNOSIS — R45851 Suicidal ideations: Secondary | ICD-10-CM | POA: Diagnosis not present

## 2022-09-20 MED ORDER — IBUPROFEN 600 MG PO TABS
600.0000 mg | ORAL_TABLET | Freq: Four times a day (QID) | ORAL | Status: DC | PRN
  Administered 2022-09-21: 600 mg via ORAL
  Filled 2022-09-20: qty 1

## 2022-09-20 NOTE — ED Notes (Signed)
Pt refused AA and group

## 2022-09-20 NOTE — Discharge Planning (Signed)
Referral was received and per Chanetta Marshall, patient has been accepted and can transfer to the facility on tomorrow 4/10 by 9:00am. Update has been provided to the patient and MD made aware. Patient will need a 14-30 day supply of medication and one month refill. No nicotine gum allowed, however 14-30 day nicotine patches to be provided if needed. No other needs to report at this time.    LCSW will continue to follow up and provide updates as received.    Fernande Boyden, LCSW Clinical Social Worker [place] [facility] Ph: [phone]

## 2022-09-20 NOTE — ED Notes (Signed)
Patient stated to this nurse when questioned if he was SI/HI, "I do not know what I would do". Patient remained in bed this morning after breakfast. No scheduled meds to admin. Safety maintained and will continue to monitor.

## 2022-09-20 NOTE — [facility] Group Notes (Signed)
[facility] LCSW Group Therapy Note  Date/Time: 09/20/2022 at 1:30pm  Type of Therapy/Topic:  Group Therapy:  Balance in Life  Participation Level:  Active  Description of Group:   This group will address the importance of considering the journey and not just the destination. Patients will be encouraged to process areas in their lives where they found it hard to focus on the good rather than the bad, and identify reasons for maintaining such thought pattern. Facilitator will guide patients utilizing problem- solving interventions to address and improve the way each patient views themselves and their situation. Patients will work through understanding and applying humility when it comes to life changes and the interactions we have with others. Patients will be encouraged to explore ways that they will move forward throughout life's journey, and make healthier decisions for themselves.   Therapeutic Goals: Patient will identify two or more emotions or situations that they observed or felt throughout watching the video clip. Patient will identify signs where they may have responded to someone or situation due to their current circumstance.  Patient will identify two ways to set better habits in order to achieve more peace in their lives. Patient will demonstrate ability to communicate their needs through discussion and/or role plays.  Summary of Patient Progress: Patient actively participated in group on today. Patient was able to identify areas or times in his life where he felt like it was harder to focus on the good rather than the negative. Patient reports that the video clip definitely made him reflect on the importance of showing humility and being considerate of others. Patient reports feeling encouraged from watching the video, and he was able to provide feedback to staff and peers.    Therapeutic Modalities:   Cognitive Behavioral Therapy Solution-Focused Therapy Assertiveness Training  Fernande Boyden, [place] Clinical Social Worker [place] [facility] Ph: [phone]

## 2022-09-20 NOTE — Group Note (Signed)
Group Topic: Fears and Unhealthy Coping Skills  Group Date: 09/20/2022 Start Time: 1130 End Time: 1215 Facilitators: Merrie Roof, RN  Department: [facility]  Number of Participants: 7  Group Focus: coping skills Treatment Modality:  Patient-Centered Therapy Interventions utilized were group exercise Purpose: express feelings  Name: Jon Clayton Date of Birth: Nov 27, 1995  MR: [id number]    Level of Participation: active Quality of Participation: attentive Interactions with others: gave feedback Mood/Affect: appropriate Triggers (if applicable):  Cognition: coherent/clear Progress: Significant Response:   Plan: patient will be encouraged to continue with therapy  Patients Problems:  Patient Active Problem List   Diagnosis Date Noted   Depression, unspecified depression type 09/19/2022   Vitamin D deficiency 08/17/2022   Bipolar disorder in partial remission 08/15/2022   Opioid abuse 08/15/2022   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

## 2022-09-20 NOTE — ED Notes (Signed)
Patient observed/assessed in room in bed appearing in no immediate distress resting peacefully. Q15 minute checks continued by MHT and nursing staff. Will continue to monitor and support. 

## 2022-09-20 NOTE — ED Notes (Signed)
Patient resting in assigned room. Filled out Safety sheet and FBC survey on iPad today for tomorrow's discharge. Safety maintained and will continue to monitor.

## 2022-09-20 NOTE — Group Note (Signed)
Group Topic: Understanding Self  Group Date: 09/20/2022 Start Time: 1100 End Time: 1115 Facilitators: Vonzell Schlatter B  Department: [facility]  Number of Participants: 7  Group Focus: self-awareness and social skills Treatment Modality:  Psychoeducation Interventions utilized were story telling, group exercise Purpose: express feelings and increase insight  Name: Jon Clayton Date of Birth: 1996-02-03  MR: [id number]    Level of Participation: moderate Quality of Participation: attentive and cooperative Interactions with others: gave feedback Mood/Affect: positive Triggers (if applicable): na Cognition: coherent/clear Progress: Moderate Response: na Plan: follow-up needed  Patients Problems:  Patient Active Problem List   Diagnosis Date Noted   Depression, unspecified depression type 09/19/2022   Vitamin D deficiency 08/17/2022   Bipolar disorder in partial remission 08/15/2022   Opioid abuse 08/15/2022   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

## 2022-09-20 NOTE — ED Provider Notes (Signed)
Behavioral Health Progress Note  Date and Time: 09/20/2022 12:16 PM Name: Jon Clayton MRN:  [id number]  Subjective:  Jon Clayton is a 27 year old male with a psychiatric history of MDD, cannabis use disorder, PTSD, antisocial behavior, personality disorder, polysubstance abuse who presented for suicidal ideation and assistance with detox and substance use treatment, for which he was admitted to [facility].   On assessment today, patient reports low mood. He states that he is here for assistance with substance use treatment and housing. Although, he reports that he has not used his "drug of choice" methamphetamine in nearly 2 months, as he has been hospitalized. He also went to [facility] 2-3 months ago for a 21-day stay. He does report that he has been abusing his prescription medications including Seroquel but says that these were in suicide attempts. In addition to low mood, he also endorses poor sleep, low energy, and poor focus. He endorses passive SI, stating "I want to die but I don't want to be dead." He continues, saying that it is situational in the setting of homelessness and substance use; the SI fluctuates. He denies a plan to end his life. He denies HI, but reports non-descript AVH. Patient reports somatic symptoms of dizziness, nausea, and abdominal pain. He denies further acute concerns or complaints.   Diagnosis:  Final diagnoses:  Depression, unspecified depression type    Total Time spent with patient: 30 minutes  Past Psychiatric History: MDD, cannabis use disorder, PTSD, antisocial behavior, personality disorder, polysubstance abuse   Past Medical History: Fibromyalgia, poly substance abuse, Vit. D deficiency  Family History: None reported Social History: Homeless, Unemployed, Single. Registered sex offender.  Additional Social History:                         Sleep: Poor  Appetite:  Fair  Current Medications:  Current Facility-Administered Medications  Medication  Dose Route Frequency Provider Last Rate Last Admin   acetaminophen (TYLENOL) tablet 650 mg  650 mg Oral Q6H PRN Ajibola, Ene A, NP   650 mg at 09/20/22 1212   alum & mag hydroxide-simeth (MAALOX/MYLANTA) 200-200-20 MG/5ML suspension 30 mL  30 mL Oral Q4H PRN Ajibola, Ene A, NP       hydrOXYzine (ATARAX) tablet 25 mg  25 mg Oral TID PRN Ajibola, Ene A, NP   25 mg at 09/19/22 1434   magnesium hydroxide (MILK OF MAGNESIA) suspension 30 mL  30 mL Oral Daily PRN Ajibola, Ene A, NP       OLANZapine (ZYPREXA) tablet 15 mg  15 mg Oral QHS Ajibola, Ene A, NP   15 mg at 09/19/22 2115   OLANZapine (ZYPREXA) tablet 5 mg  5 mg Oral q AM Ajibola, Ene A, NP   5 mg at 09/20/22 0659   prazosin (MINIPRESS) capsule 2 mg  2 mg Oral QHS Ajibola, Ene A, NP   2 mg at 09/19/22 2115   traZODone (DESYREL) tablet 50 mg  50 mg Oral QHS PRN Ajibola, Ene A, NP   50 mg at 09/19/22 2115   Current Outpatient Medications  Medication Sig Dispense Refill   acetaminophen (TYLENOL) 500 MG tablet Take 1,000 mg by mouth every 6 (six) hours as needed for mild pain or headache.     albuterol (VENTOLIN HFA) 108 (90 Base) MCG/ACT inhaler Inhale 2 puffs into the lungs every 6 (six) hours as needed for wheezing or shortness of breath. 6.7 g 0   divalproex (DEPAKOTE) 500 MG  DR tablet Take 500 mg by mouth 2 (two) times daily.     hydrOXYzine (VISTARIL) 25 MG capsule Take 25 mg by mouth every 6 (six) hours as needed for anxiety.     lithium carbonate (LITHOBID) 300 MG ER tablet Take 300 mg by mouth 3 (three) times daily.     OLANZapine (ZYPREXA) 15 MG tablet Take 15 mg by mouth at bedtime.     OLANZapine (ZYPREXA) 5 MG tablet Take 5 mg by mouth in the morning.     prazosin (MINIPRESS) 2 MG capsule Take 2 mg by mouth at bedtime.     QUEtiapine (SEROQUEL) 100 MG tablet Take 100 mg by mouth at bedtime.     QUEtiapine (SEROQUEL) 200 MG tablet Take 200 mg by mouth every 8 (eight) hours.     Vitamin D, Ergocalciferol, (DRISDOL) 1.25 MG (50000 UNIT)  CAPS capsule Take 1 capsule (50,000 Units total) by mouth every 7 (seven) days. (Patient not taking: Reported on 09/16/2022) 4 capsule 2    Labs  Lab Results:  Admission on 09/16/2022, Discharged on 09/17/2022  Component Date Value Ref Range Status   Opiates 09/16/2022 NONE DETECTED  NONE DETECTED Final   Cocaine 09/16/2022 NONE DETECTED  NONE DETECTED Final   Benzodiazepines 09/16/2022 NONE DETECTED  NONE DETECTED Final   Amphetamines 09/16/2022 NONE DETECTED  NONE DETECTED Final   Tetrahydrocannabinol 09/16/2022 NONE DETECTED  NONE DETECTED Final   Barbiturates 09/16/2022 NONE DETECTED  NONE DETECTED Final   Comment: (NOTE) DRUG SCREEN FOR MEDICAL PURPOSES ONLY.  IF CONFIRMATION IS NEEDED FOR ANY PURPOSE, NOTIFY LAB WITHIN 5 DAYS.  LOWEST DETECTABLE LIMITS FOR URINE DRUG SCREEN Drug Class                     Cutoff (ng/mL) Amphetamine and metabolites    1000 Barbiturate and metabolites    200 Benzodiazepine                 200 Opiates and metabolites        300 Cocaine and metabolites        300 THC                            50 Performed at [facility], [address], [place], [place] [address]    Sodium 09/16/2022 137  135 - 145 mmol/L Final   Potassium 09/16/2022 3.9  3.5 - 5.1 mmol/L Final   Chloride 09/16/2022 104  98 - 111 mmol/L Final   CO2 09/16/2022 26  22 - 32 mmol/L Final   Glucose, Bld 09/16/2022 80  70 - 99 mg/dL Final   Glucose reference range applies only to samples taken after fasting for at least 8 hours.   BUN 09/16/2022 18  6 - 20 mg/dL Final   Creatinine, Ser 09/16/2022 0.87  0.61 - 1.24 mg/dL Final   Calcium 60/45/4098 8.6 (L)  8.9 - 10.3 mg/dL Final   Total Protein 11/91/4782 6.8  6.5 - 8.1 g/dL Final   Albumin 95/62/1308 4.2  3.5 - 5.0 g/dL Final   AST 65/78/4696 34  15 - 41 U/L Final   ALT 09/16/2022 36  0 - 44 U/L Final   Alkaline Phosphatase 09/16/2022 42  38 - 126 U/L Final   Total Bilirubin 09/16/2022 0.5  0.3 - 1.2 mg/dL  Final   GFR, Estimated 09/16/2022 >60  >60 mL/min Final   Comment: (NOTE) Calculated using the  CKD-EPI Creatinine Equation (2021)    Anion gap 09/16/2022 7  5 - 15 Final   Performed at [facility], [address] [place], [place] [address]   Alcohol, Ethyl (B) 09/16/2022 <10  <10 mg/dL Final   Comment: (NOTE) Lowest detectable limit for serum alcohol is 10 mg/dL.  For medical purposes only. Performed at [facility], [address], [place], [place] [address]    WBC 09/16/2022 7.4  4.0 - 10.5 K/uL Final   RBC 09/16/2022 4.89  4.22 - 5.81 MIL/uL Final   Hemoglobin 09/16/2022 14.5  13.0 - 17.0 g/dL Final   HCT 09/81/1914 42.6  39.0 - 52.0 % Final   MCV 09/16/2022 87.1  80.0 - 100.0 fL Final   MCH 09/16/2022 29.7  26.0 - 34.0 pg Final   MCHC 09/16/2022 34.0  30.0 - 36.0 g/dL Final   RDW 78/29/5621 13.2  11.5 - 15.5 % Final   Platelets 09/16/2022 229  150 - 400 K/uL Final   nRBC 09/16/2022 0.0  0.0 - 0.2 % Final   Neutrophils Relative % 09/16/2022 59  % Final   Neutro Abs 09/16/2022 4.3  1.7 - 7.7 K/uL Final   Lymphocytes Relative 09/16/2022 33  % Final   Lymphs Abs 09/16/2022 2.5  0.7 - 4.0 K/uL Final   Monocytes Relative 09/16/2022 7  % Final   Monocytes Absolute 09/16/2022 0.5  0.1 - 1.0 K/uL Final   Eosinophils Relative 09/16/2022 1  % Final   Eosinophils Absolute 09/16/2022 0.1  0.0 - 0.5 K/uL Final   Basophils Relative 09/16/2022 0  % Final   Basophils Absolute 09/16/2022 0.0  0.0 - 0.1 K/uL Final   Immature Granulocytes 09/16/2022 0  % Final   Abs Immature Granulocytes 09/16/2022 0.03  0.00 - 0.07 K/uL Final   Performed at [facility], [address], [place], [place] [address]   Total CK 09/16/2022 498 (H)  49 - 397 U/L Final   Performed at [facility], [address], [place], [place] [address]   Acetaminophen (Tylenol), Serum 09/16/2022 <10 (L)  10 - 30 ug/mL Final   Comment:  (NOTE) Therapeutic concentrations vary significantly. A range of 10-30 ug/mL  may be an effective concentration for many patients. However, some  are best treated at concentrations outside of this range. Acetaminophen concentrations >150 ug/mL at 4 hours after ingestion  and >50 ug/mL at 12 hours after ingestion are often associated with  toxic reactions.  Performed at [facility], [address] [place], [place] [address]    Salicylate Lvl 09/16/2022 <7.0 (L)  7.0 - 30.0 mg/dL Final   Performed at [facility], [address], [place], [place] [address]   Valproic Acid Lvl 09/16/2022 <10 (L)  50.0 - 100.0 ug/mL Final   Comment: RESULT CONFIRMED BY MANUAL DILUTION Performed at [facility], [address], [place], [place] [address]    SARS Coronavirus 2 by RT PCR 09/16/2022 NEGATIVE  NEGATIVE Final   Comment: (NOTE) SARS-CoV-2 target nucleic acids are NOT DETECTED.  The SARS-CoV-2 RNA is generally detectable in upper and lower respiratory specimens during the acute phase of infection. The lowest concentration of SARS-CoV-2 viral copies this assay can detect is 250 copies / mL. A negative result does not preclude SARS-CoV-2 infection and should not be used as the sole basis for treatment or other patient management decisions.  A negative result may occur with improper specimen  collection / handling, submission of specimen other than nasopharyngeal swab, presence of viral mutation(s) within the areas targeted by this assay, and inadequate number of viral copies (<250 copies / mL). A negative result must be combined with clinical observations, patient history, and epidemiological information.  Fact Sheet for Patients:   RoadLapTop.co.za  Fact Sheet for Healthcare Providers: http://kim-Tilley.com/  This test is not yet approved or                           cleared by the [place] FDA and has been authorized for detection and/or diagnosis of SARS-CoV-2 by FDA under an Emergency Use Authorization (EUA).  This EUA will remain in effect (meaning this test can be used) for the duration of the COVID-19 declaration under Section 564(b)(1) of the Act, 21 U.S.C. section 360bbb-3(b)(1), unless the authorization is terminated or revoked sooner.  Performed at [facility], [address] [place], [place] [address]    Lithium Lvl 09/17/2022 <0.06 (L)  0.60 - 1.20 mmol/L Final   Performed at [facility], [address], [place], [place] [address]  Admission on 08/18/2022, Discharged on 08/20/2022  Component Date Value Ref Range Status   Sodium 08/18/2022 139  135 - 145 mmol/L Final   Potassium 08/18/2022 3.9  3.5 - 5.1 mmol/L Final   Chloride 08/18/2022 106  98 - 111 mmol/L Final   CO2 08/18/2022 24  22 - 32 mmol/L Final   Glucose, Bld 08/18/2022 98  70 - 99 mg/dL Final   Glucose reference range applies only to samples taken after fasting for at least 8 hours.   BUN 08/18/2022 13  6 - 20 mg/dL Final   Creatinine, Ser 08/18/2022 0.91  0.61 - 1.24 mg/dL Final   Calcium 09/81/1914 8.9  8.9 - 10.3 mg/dL Final   Total Protein 78/29/5621 7.0  6.5 - 8.1 g/dL Final   Albumin 30/86/5784 4.2  3.5 - 5.0 g/dL Final   AST 69/62/9528 19  15 - 41 U/L Final   ALT 08/18/2022 15  0 - 44 U/L Final   Alkaline Phosphatase 08/18/2022 44  38 - 126 U/L Final   Total Bilirubin 08/18/2022 0.6  0.3 - 1.2 mg/dL Final   GFR, Estimated 08/18/2022 >60  >60 mL/min Final   Comment: (NOTE) Calculated using the CKD-EPI Creatinine Equation (2021)    Anion gap 08/18/2022 9  5 - 15 Final   Performed at [facility], [address] [place], [place] [address]   Alcohol, Ethyl (B) 08/18/2022 <10  <10 mg/dL Final   Comment: (NOTE) Lowest detectable limit for serum alcohol is 10 mg/dL.  For medical purposes only. Performed at [facility],  [address] [place], [place] [address]    Salicylate Lvl 08/18/2022 <7.0 (L)  7.0 - 30.0 mg/dL Final   Performed at [facility], [address] [place], [place] [address]   Acetaminophen (Tylenol), Serum 08/18/2022 <10 (L)  10 - 30 ug/mL Final   Comment: (NOTE) Therapeutic concentrations vary significantly. A range of 10-30 ug/mL  may be an effective concentration for many patients. However, some  are best treated at concentrations outside of this range. Acetaminophen concentrations >150 ug/mL at 4 hours after ingestion  and >50 ug/mL at 12 hours after ingestion are often associated with  toxic reactions.  Performed at [facility], [address] [place], [place]  [address]    WBC 08/18/2022 5.7  4.0 - 10.5 K/uL Final   RBC 08/18/2022 4.87  4.22 - 5.81 MIL/uL Final   Hemoglobin 08/18/2022 14.4  13.0 - 17.0 g/dL Final   HCT 60/45/4098 43.7  39.0 - 52.0 % Final   MCV 08/18/2022 89.7  80.0 - 100.0 fL Final   MCH 08/18/2022 29.6  26.0 - 34.0 pg Final   MCHC 08/18/2022 33.0  30.0 - 36.0 g/dL Final   RDW 11/91/4782 13.2  11.5 - 15.5 % Final   Platelets 08/18/2022 190  150 - 400 K/uL Final   nRBC 08/18/2022 0.0  0.0 - 0.2 % Final   Performed at [facility], [address] [place], [place] [address]   Glucose-Capillary 08/19/2022 76  70 - 99 mg/dL Final   Glucose reference range applies only to samples taken after fasting for at least 8 hours.   Acetaminophen (Tylenol), Serum 08/18/2022 <10 (L)  10 - 30 ug/mL Final   Comment: (NOTE) Therapeutic concentrations vary significantly. A range of 10-30 ug/mL  may be an effective concentration for many patients. However, some  are best treated at concentrations outside of this range. Acetaminophen concentrations >150 ug/mL at 4 hours after ingestion  and >50 ug/mL at 12 hours after ingestion are often associated with  toxic reactions.  Performed at [facility], [address]  [place], [place] [address]    Sodium 08/18/2022 138  135 - 145 mmol/L Final   Potassium 08/18/2022 4.2  3.5 - 5.1 mmol/L Final   Chloride 08/18/2022 106  98 - 111 mmol/L Final   CO2 08/18/2022 26  22 - 32 mmol/L Final   Glucose, Bld 08/18/2022 85  70 - 99 mg/dL Final   Glucose reference range applies only to samples taken after fasting for at least 8 hours.   BUN 08/18/2022 14  6 - 20 mg/dL Final   Creatinine, Ser 08/18/2022 0.90  0.61 - 1.24 mg/dL Final   Calcium 78/46/9629 8.7 (L)  8.9 - 10.3 mg/dL Final   Total Protein 52/84/1324 6.6  6.5 - 8.1 g/dL Final   Albumin 40/03/2724 3.8  3.5 - 5.0 g/dL Final   AST 36/64/4034 16  15 - 41 U/L Final   ALT 08/18/2022 16  0 - 44 U/L Final   Alkaline Phosphatase 08/18/2022 42  38 - 126 U/L Final   Total Bilirubin 08/18/2022 0.7  0.3 - 1.2 mg/dL Final   GFR, Estimated 08/18/2022 >60  >60 mL/min Final   Comment: (NOTE) Calculated using the CKD-EPI Creatinine Equation (2021)    Anion gap 08/18/2022 6  5 - 15 Final   Performed at [facility], [address] [place], [place] [address]   Glucose-Capillary 08/19/2022 82  70 - 99 mg/dL Final   Glucose reference range applies only to samples taken after fasting for at least 8 hours.   SARS Coronavirus 2 by RT PCR 08/19/2022 NEGATIVE  NEGATIVE Final   Comment: (NOTE) SARS-CoV-2 target nucleic acids are NOT DETECTED.  The SARS-CoV-2 RNA is generally detectable in upper respiratory specimens during the acute phase of infection. The lowest concentration of SARS-CoV-2 viral copies this assay can detect is 138 copies/mL. A negative result does not preclude SARS-Cov-2 infection and should not be used as the sole basis for treatment or other patient management decisions. A negative result may occur with  improper specimen collection/handling, submission of specimen other than nasopharyngeal swab, presence of viral mutation(s)  within the areas targeted by this assay, and inadequate number of  viral copies(<138 copies/mL). A negative result must be combined with clinical observations, patient history, and epidemiological information. The expected result is Negative.  Fact Sheet for Patients:  BloggerCourse.comhttps://www.fda.gov/media/152166/download  Fact Sheet for Healthcare Providers:  SeriousBroker.ithttps://www.fda.gov/media/152162/download  This test is no                          t yet approved or cleared by the [place] FDA and  has been authorized for detection and/or diagnosis of SARS-CoV-2 by FDA under an Emergency Use Authorization (EUA). This EUA will remain  in effect (meaning this test can be used) for the duration of the COVID-19 declaration under Section 564(b)(1) of the Act, 21 U.S.C.section 360bbb-3(b)(1), unless the authorization is terminated  or revoked sooner.       Influenza A by PCR 08/19/2022 NEGATIVE  NEGATIVE Final   Influenza B by PCR 08/19/2022 NEGATIVE  NEGATIVE Final   Comment: (NOTE) The Xpert Xpress SARS-CoV-2/FLU/RSV plus assay is intended as an aid in the diagnosis of influenza from Nasopharyngeal swab specimens and should not be used as a sole basis for treatment. Nasal washings and aspirates are unacceptable for Xpert Xpress SARS-CoV-2/FLU/RSV testing.  Fact Sheet for Patients: BloggerCourse.comhttps://www.fda.gov/media/152166/download  Fact Sheet for Healthcare Providers: SeriousBroker.ithttps://www.fda.gov/media/152162/download  This test is not yet approved or cleared by the [place] FDA and has been authorized for detection and/or diagnosis of SARS-CoV-2 by FDA under an Emergency Use Authorization (EUA). This EUA will remain in effect (meaning this test can be used) for the duration of the COVID-19 declaration under Section 564(b)(1) of the Act, 21 U.S.C. section 360bbb-3(b)(1), unless the authorization is terminated or revoked.     Resp Syncytial Virus by PCR 08/19/2022 NEGATIVE  NEGATIVE Final   Comment: (NOTE) Fact Sheet for  Patients: BloggerCourse.comhttps://www.fda.gov/media/152166/download  Fact Sheet for Healthcare Providers: SeriousBroker.ithttps://www.fda.gov/media/152162/download  This test is not yet approved or cleared by the [place] FDA and has been authorized for detection and/or diagnosis of SARS-CoV-2 by FDA under an Emergency Use Authorization (EUA). This EUA will remain in effect (meaning this test can be used) for the duration of the COVID-19 declaration under Section 564(b)(1) of the Act, 21 U.S.C. section 360bbb-3(b)(1), unless the authorization is terminated or revoked.  Performed at [facility], [address] [place], [place] [address]    Color, Urine 08/19/2022 YELLOW  YELLOW Final   APPearance 08/19/2022 CLEAR  CLEAR Final   Specific Gravity, Urine 08/19/2022 1.025  1.005 - 1.030 Final   pH 08/19/2022 6.0  5.0 - 8.0 Final   Glucose, UA 08/19/2022 NEGATIVE  NEGATIVE mg/dL Final   Hgb urine dipstick 08/19/2022 NEGATIVE  NEGATIVE Final   Bilirubin Urine 08/19/2022 NEGATIVE  NEGATIVE Final   Ketones, ur 08/19/2022 NEGATIVE  NEGATIVE mg/dL Final   Protein, ur 29/56/213003/01/2023 NEGATIVE  NEGATIVE mg/dL Final   Nitrite 86/57/846903/01/2023 NEGATIVE  NEGATIVE Final   Leukocytes,Ua 08/19/2022 NEGATIVE  NEGATIVE Final   Comment: Microscopic not done on urines with negative protein, blood, leukocytes, nitrite, or glucose < 500 mg/dL. Performed at [facility], [address] [place], [place] [address]   Admission on 08/18/2022, Discharged on 08/18/2022  Component Date Value Ref Range Status   Sodium 08/18/2022 137  135 - 145 mmol/L Final   Potassium 08/18/2022 3.7  3.5 - 5.1 mmol/L Final   Chloride 08/18/2022 107  98 - 111 mmol/L Final  CO2 08/18/2022 22  22 - 32 mmol/L Final   Glucose, Bld 08/18/2022 122 (H)  70 - 99 mg/dL Final   Glucose reference range applies only to samples taken after fasting for at least 8 hours.   BUN 08/18/2022 13  6 - 20 mg/dL Final   Creatinine, Ser 08/18/2022 0.94  0.61 -  1.24 mg/dL Final   Calcium 09/81/1914 8.3 (L)  8.9 - 10.3 mg/dL Final   Total Protein 78/29/5621 6.6  6.5 - 8.1 g/dL Final   Albumin 30/86/5784 3.8  3.5 - 5.0 g/dL Final   AST 69/62/9528 28  15 - 41 U/L Final   ALT 08/18/2022 12  0 - 44 U/L Final   Alkaline Phosphatase 08/18/2022 43  38 - 126 U/L Final   Total Bilirubin 08/18/2022 0.4  0.3 - 1.2 mg/dL Final   GFR, Estimated 08/18/2022 >60  >60 mL/min Final   Comment: (NOTE) Calculated using the CKD-EPI Creatinine Equation (2021)    Anion gap 08/18/2022 8  5 - 15 Final   Performed at [facility], [address] [place], [place] [address]   Alcohol, Ethyl (B) 08/18/2022 <10  <10 mg/dL Final   Comment: (NOTE) Lowest detectable limit for serum alcohol is 10 mg/dL.  For medical purposes only. Performed at [facility], [address] [place], [place] [address]    Salicylate Lvl 08/18/2022 <7.0 (L)  7.0 - 30.0 mg/dL Final   Performed at [facility], [address] [place], [place] [address]   Acetaminophen (Tylenol), Serum 08/18/2022 <10 (L)  10 - 30 ug/mL Final   Comment: (NOTE) Therapeutic concentrations vary significantly. A range of 10-30 ug/mL  may be an effective concentration for many patients. However, some  are best treated at concentrations outside of this range. Acetaminophen concentrations >150 ug/mL at 4 hours after ingestion  and >50 ug/mL at 12 hours after ingestion are often associated with  toxic reactions.  Performed at [facility], [address] [place], [place] [address]    WBC 08/18/2022 5.8  4.0 - 10.5 K/uL Final   RBC 08/18/2022 4.71  4.22 - 5.81 MIL/uL Final   Hemoglobin 08/18/2022 13.9  13.0 - 17.0 g/dL Final   HCT 47/42/5956 42.6  39.0 - 52.0 % Final   MCV 08/18/2022 90.4  80.0 - 100.0 fL Final   MCH 08/18/2022 29.5  26.0 - 34.0 pg Final   MCHC 08/18/2022 32.6  30.0 - 36.0 g/dL Final   RDW 38/75/6433 13.1  11.5 - 15.5 % Final   Platelets 08/18/2022 183   150 - 400 K/uL Final   nRBC 08/18/2022 0.0  0.0 - 0.2 % Final   Performed at [facility], [address] [place], [place] [address]   Opiates 08/18/2022 NONE DETECTED  NONE DETECTED Final   Cocaine 08/18/2022 NONE DETECTED  NONE DETECTED Final   Benzodiazepines 08/18/2022 NONE DETECTED  NONE DETECTED Final   Amphetamines 08/18/2022 NONE DETECTED  NONE DETECTED Final   Tetrahydrocannabinol 08/18/2022 NONE DETECTED  NONE DETECTED Final   Barbiturates 08/18/2022 NONE DETECTED  NONE DETECTED Final   Comment: (NOTE) DRUG SCREEN FOR MEDICAL PURPOSES ONLY.  IF CONFIRMATION IS NEEDED FOR ANY PURPOSE, NOTIFY LAB WITHIN 5 DAYS.  LOWEST DETECTABLE LIMITS FOR URINE DRUG SCREEN Drug Class                     Cutoff (ng/mL) Amphetamine and metabolites  1000 Barbiturate and metabolites    200 Benzodiazepine                 200 Opiates and metabolites        300 Cocaine and metabolites        300 THC                            50 Performed at [facility], [address] [place], [place] [address]   Office Visit on 08/15/2022  Component Date Value Ref Range Status   Valproic Acid Lvl 08/15/2022 55  50 - 100 ug/mL Final   Comment:                                 Detection Limit = 4                            <4 indicates None Detected Toxicity may occur at levels of 100-500. Measurements of free unbound valproic acid may improve the assess- ment of clinical response.    Glucose 08/15/2022 107 (H)  70 - 99 mg/dL Final   BUN 60/45/4098 10  6 - 20 mg/dL Final   Creatinine, Ser 08/15/2022 0.89  0.76 - 1.27 mg/dL Final   eGFR 11/91/4782 123  >59 mL/min/1.73 Final   BUN/Creatinine Ratio 08/15/2022 11  9 - 20 Final   Sodium 08/15/2022 142  134 - 144 mmol/L Final   Potassium 08/15/2022 4.3  3.5 - 5.2 mmol/L Final   Chloride 08/15/2022 102  96 - 106 mmol/L Final   CO2 08/15/2022 22  20 - 29 mmol/L Final   Calcium 08/15/2022 9.6  8.7 - 10.2 mg/dL Final   Vit D,  [id number]-AOZHYQM 08/15/2022 13.7 (L)  30.0 - 100.0 ng/mL Final   Comment: Vitamin D deficiency has been defined by the Institute of Medicine and an Endocrine Society practice guideline as a level of serum 25-OH vitamin D less than 20 ng/mL (1,2). The Endocrine Society went on to further define vitamin D insufficiency as a level between 21 and 29 ng/mL (2). 1. IOM (Institute of Medicine). 2010. Dietary reference    intakes for calcium and D. [place]: The    [employer] Communications. 2. Holick MF, Binkley [place], Bischoff-Ferrari HA, et al.    Evaluation, treatment, and prevention of vitamin D    deficiency: an Endocrine Society clinical practice    guideline. JCEM. 2011 Jul; 96(7):1911-30.    TSH 08/15/2022 2.100  0.450 - 4.500 uIU/mL Final   T4, Total 08/15/2022 5.2  4.5 - 12.0 ug/dL Final   T3 Uptake Ratio 08/15/2022 28  24 - 39 % Final   Free Thyroxine Index 08/15/2022 1.5  1.2 - 4.9 Final   HCV Ab 08/15/2022 Non Reactive  Non Reactive Final   HIV Screen 4th Generation wRfx 08/15/2022 Non Reactive  Non Reactive Final   Comment: HIV Negative HIV-1/HIV-2 antibodies and HIV-1 p24 antigen were NOT detected. There is no laboratory evidence of HIV infection.    RPR Ser Ql 08/15/2022 Non Reactive  Non Reactive Final   Neisseria Gonorrhea 08/15/2022 Negative   Final   Chlamydia 08/15/2022 Negative   Final   Trichomonas 08/15/2022 Negative   Final   Comment 08/15/2022 Normal Reference Ranger Chlamydia - Negative   Final   Comment 08/15/2022 Normal  Reference Range Neisseria Gonorrhea - Negative   Final   Comment 08/15/2022 Normal Reference Range Trichomonas - Negative   Final   HCV Interp 1: 08/15/2022 Comment   Final   Comment: Not infected with HCV unless early or acute infection is suspected (which may be delayed in an immunocompromised individual), or other evidence exists to indicate HCV infection.   Admission on 07/21/2022, Discharged on 07/22/2022  Component Date Value Ref Range  Status   Sodium 07/21/2022 140  135 - 145 mmol/L Final   Potassium 07/21/2022 4.3  3.5 - 5.1 mmol/L Final   Chloride 07/21/2022 105  98 - 111 mmol/L Final   CO2 07/21/2022 25  22 - 32 mmol/L Final   Glucose, Bld 07/21/2022 127 (H)  70 - 99 mg/dL Final   Glucose reference range applies only to samples taken after fasting for at least 8 hours.   BUN 07/21/2022 11  6 - 20 mg/dL Final   Creatinine, Ser 07/21/2022 0.94  0.61 - 1.24 mg/dL Final   Calcium 16/03/9603 8.7 (L)  8.9 - 10.3 mg/dL Final   Total Protein 54/02/8118 6.0 (L)  6.5 - 8.1 g/dL Final   Albumin 14/78/2956 3.8  3.5 - 5.0 g/dL Final   AST 21/30/8657 18  15 - 41 U/L Final   ALT 07/21/2022 12  0 - 44 U/L Final   Alkaline Phosphatase 07/21/2022 46  38 - 126 U/L Final   Total Bilirubin 07/21/2022 0.3  0.3 - 1.2 mg/dL Final   GFR, Estimated 07/21/2022 >60  >60 mL/min Final   Comment: (NOTE) Calculated using the CKD-EPI Creatinine Equation (2021)    Anion gap 07/21/2022 10  5 - 15 Final   Performed at [facility], [address], [place], [place] [address]   Alcohol, Ethyl (B) 07/21/2022 <10  <10 mg/dL Final   Comment: (NOTE) Lowest detectable limit for serum alcohol is 10 mg/dL.  For medical purposes only. Performed at [facility], [address] [place], [place] [address]    Salicylate Lvl 07/21/2022 <7.0 (L)  7.0 - 30.0 mg/dL Final   Performed at [facility], [address] [place], [place] [address]   Acetaminophen (Tylenol), Serum 07/21/2022 <10 (L)  10 - 30 ug/mL Final   Comment: (NOTE) Therapeutic concentrations vary significantly. A range of 10-30 ug/mL  may be an effective concentration for many patients. However, some  are best treated at concentrations outside of this range. Acetaminophen concentrations >150 ug/mL at 4 hours after ingestion  and >50 ug/mL at 12 hours after ingestion are often associated with  toxic reactions.  Performed at [facility], [address],  [place], [place] [address]    WBC 07/21/2022 6.4  4.0 - 10.5 K/uL Final   RBC 07/21/2022 4.73  4.22 - 5.81 MIL/uL Final   Hemoglobin 07/21/2022 13.8  13.0 - 17.0 g/dL Final   HCT 72/53/6644 43.7  39.0 - 52.0 % Final   MCV 07/21/2022 92.4  80.0 - 100.0 fL Final   MCH 07/21/2022 29.2  26.0 - 34.0 pg Final   MCHC 07/21/2022 31.6  30.0 - 36.0 g/dL Final   RDW 03/47/4259 13.4  11.5 - 15.5 % Final   Platelets 07/21/2022 240  150 - 400 K/uL Final   nRBC 07/21/2022 0.0  0.0 - 0.2 % Final   Performed at [facility], [address], [place], [place] [address]   Opiates 07/21/2022 NONE DETECTED  NONE DETECTED Final  Cocaine 07/21/2022 NONE DETECTED  NONE DETECTED Final   Benzodiazepines 07/21/2022 NONE DETECTED  NONE DETECTED Final   Amphetamines 07/21/2022 NONE DETECTED  NONE DETECTED Final   Tetrahydrocannabinol 07/21/2022 NONE DETECTED  NONE DETECTED Final   Barbiturates 07/21/2022 NONE DETECTED  NONE DETECTED Final   Comment: (NOTE) DRUG SCREEN FOR MEDICAL PURPOSES ONLY.  IF CONFIRMATION IS NEEDED FOR ANY PURPOSE, NOTIFY LAB WITHIN 5 DAYS.  LOWEST DETECTABLE LIMITS FOR URINE DRUG SCREEN Drug Class                     Cutoff (ng/mL) Amphetamine and metabolites    1000 Barbiturate and metabolites    200 Benzodiazepine                 200 Opiates and metabolites        300 Cocaine and metabolites        300 THC                            50 Performed at [facility], [address], [place], [place] [address]    Lithium Lvl 07/21/2022 <0.06 (L)  0.60 - 1.20 mmol/L Final   Performed at [facility], [address] [place], [place] [address]   Sodium 07/21/2022 141  135 - 145 mmol/L Final   Potassium 07/21/2022 4.3  3.5 - 5.1 mmol/L Final   Chloride 07/21/2022 107  98 - 111 mmol/L Final   CO2 07/21/2022 26  22 - 32 mmol/L Final   Glucose, Bld 07/21/2022 94  70 - 99 mg/dL Final   Glucose reference range applies only to samples taken after fasting for at least 8 hours.    BUN 07/21/2022 12  6 - 20 mg/dL Final   Creatinine, Ser 07/21/2022 0.99  0.61 - 1.24 mg/dL Final   Calcium 09/81/1914 8.5 (L)  8.9 - 10.3 mg/dL Final   Total Protein 78/29/5621 5.4 (L)  6.5 - 8.1 g/dL Final   Albumin 30/86/5784 3.4 (L)  3.5 - 5.0 g/dL Final   AST 69/62/9528 18  15 - 41 U/L Final   ALT 07/21/2022 11  0 - 44 U/L Final   Alkaline Phosphatase 07/21/2022 38  38 - 126 U/L Final   Total Bilirubin 07/21/2022 0.6  0.3 - 1.2 mg/dL Final   GFR, Estimated 07/21/2022 >60  >60 mL/min Final   Comment: (NOTE) Calculated using the CKD-EPI Creatinine Equation (2021)    Anion gap 07/21/2022 8  5 - 15 Final   Performed at [facility], [address] [place], [place] [address]   Acetaminophen (Tylenol), Serum 07/21/2022 <10 (L)  10 - 30 ug/mL Final   Comment: (NOTE) Therapeutic concentrations vary significantly. A range of 10-30 ug/mL  may be an effective concentration for many patients. However, some  are best treated at concentrations outside of this range. Acetaminophen concentrations >150 ug/mL at 4 hours after ingestion  and >50 ug/mL at 12 hours after ingestion are often associated with  toxic reactions.  Performed at [facility], [address], [place], [place] [address]    Salicylate Lvl 07/21/2022 <7.0 (L)  7.0 - 30.0 mg/dL Final   Performed at [facility], [address], [place], [place] [address]   SARS Coronavirus 2 by RT PCR 07/22/2022 NEGATIVE  NEGATIVE Final   Influenza A by PCR 07/22/2022 NEGATIVE  NEGATIVE Final   Influenza B  by PCR 07/22/2022 NEGATIVE  NEGATIVE Final   Comment: (NOTE) The Xpert Xpress SARS-CoV-2/FLU/RSV plus assay is intended as an aid in the diagnosis of influenza from Nasopharyngeal swab specimens and should not be used as a sole basis for treatment. Nasal washings and aspirates are unacceptable for Xpert Xpress SARS-CoV-2/FLU/RSV testing.  Fact Sheet for Patients: BloggerCourse.com  Fact  Sheet for Healthcare Providers: SeriousBroker.it  This test is not yet approved or cleared by the [place] FDA and has been authorized for detection and/or diagnosis of SARS-CoV-2 by FDA under an Emergency Use Authorization (EUA). This EUA will remain in effect (meaning this test can be used) for the duration of the COVID-19 declaration under Section 564(b)(1) of the Act, 21 U.S.C. section 360bbb-3(b)(1), unless the authorization is terminated or revoked.     Resp Syncytial Virus by PCR 07/22/2022 NEGATIVE  NEGATIVE Final   Comment: (NOTE) Fact Sheet for Patients: BloggerCourse.com  Fact Sheet for Healthcare Providers: SeriousBroker.it  This test is not yet approved or cleared by the [place] FDA and has been authorized for detection and/or diagnosis of SARS-CoV-2 by FDA under an Emergency Use Authorization (EUA). This EUA will remain in effect (meaning this test can be used) for the duration of the COVID-19 declaration under Section 564(b)(1) of the Act, 21 U.S.C. section 360bbb-3(b)(1), unless the authorization is terminated or revoked.  Performed at [facility], [address], [place], [place] [address]   Admission on 07/20/2022, Discharged on 07/21/2022  Component Date Value Ref Range Status   Sodium 07/20/2022 135  135 - 145 mmol/L Final   Potassium 07/20/2022 3.7  3.5 - 5.1 mmol/L Final   Chloride 07/20/2022 102  98 - 111 mmol/L Final   CO2 07/20/2022 25  22 - 32 mmol/L Final   Glucose, Bld 07/20/2022 164 (H)  70 - 99 mg/dL Final   Glucose reference range applies only to samples taken after fasting for at least 8 hours.   BUN 07/20/2022 18  6 - 20 mg/dL Final   Creatinine, Ser 07/20/2022 1.06  0.61 - 1.24 mg/dL Final   Calcium 60/45/4098 8.4 (L)  8.9 - 10.3 mg/dL Final   Total Protein 11/91/4782 6.9  6.5 - 8.1 g/dL Final   Albumin 95/62/1308 4.2  3.5 - 5.0 g/dL Final   AST  65/78/4696 24  15 - 41 U/L Final   ALT 07/20/2022 12  0 - 44 U/L Final   Alkaline Phosphatase 07/20/2022 47  38 - 126 U/L Final   Total Bilirubin 07/20/2022 0.5  0.3 - 1.2 mg/dL Final   GFR, Estimated 07/20/2022 >60  >60 mL/min Final   Comment: (NOTE) Calculated using the CKD-EPI Creatinine Equation (2021)    Anion gap 07/20/2022 8  5 - 15 Final   Performed at [facility], [address] [place], [place] [address]   Alcohol, Ethyl (B) 07/20/2022 <10  <10 mg/dL Final   Comment: (NOTE) Lowest detectable limit for serum alcohol is 10 mg/dL.  For medical purposes only. Performed at [facility], [address] [place], [place] [address]    Salicylate Lvl 07/20/2022 <7.0 (L)  7.0 - 30.0 mg/dL Final   Performed at [facility], [address] [place], [place] [address]   Acetaminophen (Tylenol), Serum 07/20/2022 <10 (L)  10 - 30 ug/mL Final   Comment: (NOTE) Therapeutic concentrations vary significantly. A range of 10-30 ug/mL  may be an effective concentration for many patients. However,  some  are best treated at concentrations outside of this range. Acetaminophen concentrations >150 ug/mL at 4 hours after ingestion  and >50 ug/mL at 12 hours after ingestion are often associated with  toxic reactions.  Performed at [facility], [address] [place], [place] [address]    WBC 07/20/2022 10.9 (H)  4.0 - 10.5 K/uL Final   RBC 07/20/2022 4.68  4.22 - 5.81 MIL/uL Final   Hemoglobin 07/20/2022 13.8  13.0 - 17.0 g/dL Final   HCT 60/45/4098 41.9  39.0 - 52.0 % Final   MCV 07/20/2022 89.5  80.0 - 100.0 fL Final   MCH 07/20/2022 29.5  26.0 - 34.0 pg Final   MCHC 07/20/2022 32.9  30.0 - 36.0 g/dL Final   RDW 11/91/4782 13.5  11.5 - 15.5 % Final   Platelets 07/20/2022 261  150 - 400 K/uL Final   nRBC 07/20/2022 0.0  0.0 - 0.2 % Final   Performed at [facility], [address] [place] [address] [place], [place] [address]   Opiates 07/20/2022 NONE  DETECTED  NONE DETECTED Final   Cocaine 07/20/2022 NONE DETECTED  NONE DETECTED Final   Benzodiazepines 07/20/2022 NONE DETECTED  NONE DETECTED Final   Amphetamines 07/20/2022 NONE DETECTED  NONE DETECTED Final   Tetrahydrocannabinol 07/20/2022 NONE DETECTED  NONE DETECTED Final   Barbiturates 07/20/2022 NONE DETECTED  NONE DETECTED Final   Comment: (NOTE) DRUG SCREEN FOR MEDICAL PURPOSES ONLY.  IF CONFIRMATION IS NEEDED FOR ANY PURPOSE, NOTIFY LAB WITHIN 5 DAYS.  LOWEST DETECTABLE LIMITS FOR URINE DRUG SCREEN Drug Class                     Cutoff (ng/mL) Amphetamine and metabolites    1000 Barbiturate and metabolites    200 Benzodiazepine                 200 Opiates and metabolites        300 Cocaine and metabolites        300 THC                            50 Performed at [facility], [address] [place], [place] [address]    Lithium Lvl 07/21/2022 <0.06 (L)  0.60 - 1.20 mmol/L Final   Performed at [facility], [address], [place], [place] [address]  Admission on 07/04/2022, Discharged on 07/07/2022  Component Date Value Ref Range Status   SARS Coronavirus 2 by RT PCR 07/04/2022 NEGATIVE  NEGATIVE Final   Comment: (NOTE) SARS-CoV-2 target nucleic acids are NOT DETECTED.  The SARS-CoV-2 RNA is generally detectable in upper respiratory specimens during the acute phase of infection. The lowest concentration of SARS-CoV-2 viral copies this assay can detect is 138 copies/mL. A negative result does not preclude SARS-Cov-2 infection and should not be used as the sole basis for treatment or other patient management decisions. A negative result may occur with  improper specimen collection/handling, submission of specimen other than nasopharyngeal swab, presence of viral mutation(s) within the areas targeted by this assay, and inadequate number of viral copies(<138 copies/mL). A negative result must be combined with clinical observations, patient history,  and epidemiological information. The expected result is Negative.  Fact Sheet for Patients:  BloggerCourse.com  Fact Sheet for Healthcare Providers:  SeriousBroker.it  This test is no  t yet approved or cleared by the [place] and  has been authorized for detection and/or diagnosis of SARS-CoV-2 by FDA under an Emergency Use Authorization (EUA). This EUA will remain  in effect (meaning this test can be used) for the duration of the COVID-19 declaration under Section 564(b)(1) of the Act, 21 U.S.C.section 360bbb-3(b)(1), unless the authorization is terminated  or revoked sooner.       Influenza A by PCR 07/04/2022 NEGATIVE  NEGATIVE Final   Influenza B by PCR 07/04/2022 NEGATIVE  NEGATIVE Final   Comment: (NOTE) The Xpert Xpress SARS-CoV-2/FLU/RSV plus assay is intended as an aid in the diagnosis of influenza from Nasopharyngeal swab specimens and should not be used as a sole basis for treatment. Nasal washings and aspirates are unacceptable for Xpert Xpress SARS-CoV-2/FLU/RSV testing.  Fact Sheet for Patients: BloggerCourse.com  Fact Sheet for Healthcare Providers: SeriousBroker.it  This test is not yet approved or cleared by the [place] FDA and has been authorized for detection and/or diagnosis of SARS-CoV-2 by FDA under an Emergency Use Authorization (EUA). This EUA will remain in effect (meaning this test can be used) for the duration of the COVID-19 declaration under Section 564(b)(1) of the Act, 21 U.S.C. section 360bbb-3(b)(1), unless the authorization is terminated or revoked.     Resp Syncytial Virus by PCR 07/04/2022 NEGATIVE  NEGATIVE Final   Comment: (NOTE) Fact Sheet for Patients: BloggerCourse.com  Fact Sheet for Healthcare Providers: SeriousBroker.it  This test is not  yet approved or cleared by the [place] FDA and has been authorized for detection and/or diagnosis of SARS-CoV-2 by FDA under an Emergency Use Authorization (EUA). This EUA will remain in effect (meaning this test can be used) for the duration of the COVID-19 declaration under Section 564(b)(1) of the Act, 21 U.S.C. section 360bbb-3(b)(1), unless the authorization is terminated or revoked.  Performed at [facility], [address], [place], [place] [address]    WBC 07/04/2022 10.2  4.0 - 10.5 K/uL Final   RBC 07/04/2022 4.66  4.22 - 5.81 MIL/uL Final   Hemoglobin 07/04/2022 13.8  13.0 - 17.0 g/dL Final   HCT 60/45/4098 41.0  39.0 - 52.0 % Final   MCV 07/04/2022 88.0  80.0 - 100.0 fL Final   MCH 07/04/2022 29.6  26.0 - 34.0 pg Final   MCHC 07/04/2022 33.7  30.0 - 36.0 g/dL Final   RDW 11/91/4782 14.0  11.5 - 15.5 % Final   Platelets 07/04/2022 259  150 - 400 K/uL Final   nRBC 07/04/2022 0.0  0.0 - 0.2 % Final   Neutrophils Relative % 07/04/2022 57  % Final   Neutro Abs 07/04/2022 5.9  1.7 - 7.7 K/uL Final   Lymphocytes Relative 07/04/2022 31  % Final   Lymphs Abs 07/04/2022 3.2  0.7 - 4.0 K/uL Final   Monocytes Relative 07/04/2022 8  % Final   Monocytes Absolute 07/04/2022 0.8  0.1 - 1.0 K/uL Final   Eosinophils Relative 07/04/2022 2  % Final   Eosinophils Absolute 07/04/2022 0.2  0.0 - 0.5 K/uL Final   Basophils Relative 07/04/2022 1  % Final   Basophils Absolute 07/04/2022 0.1  0.0 - 0.1 K/uL Final   Immature Granulocytes 07/04/2022 1  % Final   Abs Immature Granulocytes 07/04/2022 0.11 (H)  0.00 - 0.07 K/uL Final   Performed at [facility], [address] [place], [place] [address]   Sodium 07/04/2022 139  135 - 145 mmol/L Final  Potassium 07/04/2022 3.4 (L)  3.5 - 5.1 mmol/L Final   Chloride 07/04/2022 102  98 - 111 mmol/L Final   CO2 07/04/2022 30  22 - 32 mmol/L Final   Glucose, Bld 07/04/2022 71  70 - 99 mg/dL Final   Glucose reference range applies  only to samples taken after fasting for at least 8 hours.   BUN 07/04/2022 12  6 - 20 mg/dL Final   Creatinine, Ser 07/04/2022 0.95  0.61 - 1.24 mg/dL Final   Calcium 16/03/9603 9.0  8.9 - 10.3 mg/dL Final   Total Protein 54/02/8118 6.7  6.5 - 8.1 g/dL Final   Albumin 14/78/2956 4.2  3.5 - 5.0 g/dL Final   AST 21/30/8657 21  15 - 41 U/L Final   ALT 07/04/2022 17  0 - 44 U/L Final   Alkaline Phosphatase 07/04/2022 44  38 - 126 U/L Final   Total Bilirubin 07/04/2022 0.3  0.3 - 1.2 mg/dL Final   GFR, Estimated 07/04/2022 >60  >60 mL/min Final   Comment: (NOTE) Calculated using the CKD-EPI Creatinine Equation (2021)    Anion gap 07/04/2022 7  5 - 15 Final   Performed at [facility], [address], [place], [place] [address]   Hgb A1c MFr Bld 07/04/2022 4.9  4.8 - 5.6 % Final   Comment: (NOTE) Pre diabetes:          5.7%-6.4%  Diabetes:              >6.4%  Glycemic control for   <7.0% adults with diabetes    Mean Plasma Glucose 07/04/2022 93.93  mg/dL Final   Performed at [facility], [address], [place], [place] [address]   POC Amphetamine UR 07/04/2022 None Detected  NONE DETECTED (Cut Off Level 1000 ng/mL) Final   POC Secobarbital (BAR) 07/04/2022 None Detected  NONE DETECTED (Cut Off Level 300 ng/mL) Final   POC Buprenorphine (BUP) 07/04/2022 None Detected  NONE DETECTED (Cut Off Level 10 ng/mL) Final   POC Oxazepam (BZO) 07/04/2022 None Detected  NONE DETECTED (Cut Off Level 300 ng/mL) Final   POC Cocaine UR 07/04/2022 None Detected  NONE DETECTED (Cut Off Level 300 ng/mL) Final   POC Methamphetamine UR 07/04/2022 None Detected  NONE DETECTED (Cut Off Level 1000 ng/mL) Final   POC Morphine 07/04/2022 None Detected  NONE DETECTED (Cut Off Level 300 ng/mL) Final   POC Methadone UR 07/04/2022 None Detected  NONE DETECTED (Cut Off Level 300 ng/mL) Final   POC Oxycodone UR 07/04/2022 None Detected  NONE DETECTED (Cut Off Level 100 ng/mL) Final   POC Marijuana UR  07/04/2022 None Detected  NONE DETECTED (Cut Off Level 50 ng/mL) Final   Lithium Lvl 07/04/2022 0.49 (L)  0.60 - 1.20 mmol/L Final   Performed at [facility], [address], [place], [place] [address]   Valproic Acid Lvl 07/04/2022 52  50.0 - 100.0 ug/mL Final   Performed at [facility], [address], [place], [place] [address]   SARSCOV2ONAVIRUS 2 AG 07/04/2022 NEGATIVE  NEGATIVE Final   Comment: (NOTE) SARS-CoV-2 antigen NOT DETECTED.   Negative results are presumptive.  Negative results do not preclude SARS-CoV-2 infection and should not be used as the sole basis for treatment or other patient management decisions, including infection  control decisions, particularly in the presence of clinical signs and  symptoms consistent with COVID-19, or in those who have been in contact with the virus.  Negative  results must be combined with clinical observations, patient history, and epidemiological information. The expected result is Negative.  Fact Sheet for Patients: https://www.jennings-kim.com/  Fact Sheet for Healthcare Providers: https://alexander-rogers.biz/  This test is not yet approved or cleared by the [place] FDA and  has been authorized for detection and/or diagnosis of SARS-CoV-2 by FDA under an Emergency Use Authorization (EUA).  This EUA will remain in effect (meaning this test can be used) for the duration of  the COV                          [place]-19 declaration under Section 564(b)(1) of the Act, 21 U.S.C. section 360bbb-3(b)(1), unless the authorization is terminated or revoked sooner.     Cholesterol 07/04/2022 150  0 - 200 mg/dL Final   Triglycerides 16/03/9603 106  <150 mg/dL Final   HDL 54/02/8118 50  >40 mg/dL Final   Total CHOL/HDL Ratio 07/04/2022 3.0  RATIO Final   VLDL 07/04/2022 21  0 - 40 mg/dL Final   LDL Cholesterol 07/04/2022 79  0 - 99 mg/dL Final   Comment:        Total Cholesterol/HDL:CHD Risk Coronary  Heart Disease Risk Table                     Men   Women  1/2 Average Risk   3.4   3.3  Average Risk       5.0   4.4  2 X Average Risk   9.6   7.1  3 X Average Risk  23.4   11.0        Use the calculated Patient Ratio above and the CHD Risk Table to determine the patient's CHD Risk.        ATP III CLASSIFICATION (LDL):  <100     mg/dL   Optimal  147-829  mg/dL   Near or Above                    Optimal  130-159  mg/dL   Borderline  562-130  mg/dL   High  >865     mg/dL   Very High Performed at [facility], [address] [place], [place] [address]    TSH 07/04/2022 5.790 (H)  0.350 - 4.500 uIU/mL Final   Comment: Performed by a 3rd Generation assay with a functional sensitivity of <=0.01 uIU/mL. Performed at [facility], [address] [place], [place] [address]    T3, Free 07/04/2022 3.6  2.0 - 4.4 pg/mL Final   Comment: (NOTE) Performed At: [facility] [address] [place], [place] [id number] Jolene Schimke MD UU:[id number]    Free T4 07/04/2022 0.65  0.61 - 1.12 ng/dL Final   Comment: (NOTE) Biotin ingestion may interfere with free T4 tests. If the results are inconsistent with the TSH level, previous test results, or the clinical presentation, then consider biotin interference. If needed, order repeat testing after stopping biotin. Performed at [facility], [address] [place], [place] [address]   Admission on 06/03/2022, Discharged on 06/07/2022  Component Date Value Ref Range Status   Sodium 06/03/2022 137  135 - 145 mmol/L Final   Potassium 06/03/2022 3.5  3.5 - 5.1 mmol/L Final   Chloride 06/03/2022 107  98 - 111 mmol/L Final   CO2 06/03/2022 22  22 - 32 mmol/L Final   Glucose, Bld 06/03/2022 99  70 - 99  mg/dL Final   Glucose reference range applies only to samples taken after fasting for at least 8 hours.   BUN 06/03/2022 7  6 - 20 mg/dL Final   Creatinine, Ser 06/03/2022 0.93  0.61 - 1.24 mg/dL Final   Calcium 16/03/9603  9.1  8.9 - 10.3 mg/dL Final   Total Protein 54/02/8118 7.4  6.5 - 8.1 g/dL Final   Albumin 14/78/2956 4.4  3.5 - 5.0 g/dL Final   AST 21/30/8657 19  15 - 41 U/L Final   ALT 06/03/2022 13  0 - 44 U/L Final   Alkaline Phosphatase 06/03/2022 57  38 - 126 U/L Final   Total Bilirubin 06/03/2022 0.9  0.3 - 1.2 mg/dL Final   GFR, Estimated 06/03/2022 >60  >60 mL/min Final   Comment: (NOTE) Calculated using the CKD-EPI Creatinine Equation (2021)    Anion gap 06/03/2022 8  5 - 15 Final   Performed at [facility], [address] [place], [place] [address]   Alcohol, Ethyl (B) 06/03/2022 <10  <10 mg/dL Final   Comment: (NOTE) Lowest detectable limit for serum alcohol is 10 mg/dL.  For medical purposes only. Performed at [facility], [address] [place], [place] [address]    Salicylate Lvl 06/03/2022 <7.0 (L)  7.0 - 30.0 mg/dL Final   Performed at [facility], [address] [place], [place] [address]   Acetaminophen (Tylenol), Serum 06/03/2022 <10 (L)  10 - 30 ug/mL Final   Comment: (NOTE) Therapeutic concentrations vary significantly. A range of 10-30 ug/mL  may be an effective concentration for many patients. However, some  are best treated at concentrations outside of this range. Acetaminophen concentrations >150 ug/mL at 4 hours after ingestion  and >50 ug/mL at 12 hours after ingestion are often associated with  toxic reactions.  Performed at [facility], [address] [place], [place] [address]    WBC 06/03/2022 8.5  4.0 - 10.5 K/uL Final   RBC 06/03/2022 5.11  4.22 - 5.81 MIL/uL Final   Hemoglobin 06/03/2022 14.8  13.0 - 17.0 g/dL Final   HCT 72/53/6644 43.8  39.0 - 52.0 % Final   MCV 06/03/2022 85.7  80.0 - 100.0 fL Final   MCH 06/03/2022 29.0  26.0 - 34.0 pg Final   MCHC 06/03/2022 33.8  30.0 - 36.0 g/dL Final   RDW 03/47/4259 14.0  11.5 - 15.5 % Final   Platelets 06/03/2022 249  150 - 400 K/uL Final   nRBC 06/03/2022  0.0  0.0 - 0.2 % Final   Performed at [facility], [address] [place], [place] [address]   Opiates 06/03/2022 NONE DETECTED  NONE DETECTED Final   Cocaine 06/03/2022 NONE DETECTED  NONE DETECTED Final   Benzodiazepines 06/03/2022 NONE DETECTED  NONE DETECTED Final   Amphetamines 06/03/2022 POSITIVE (A)  NONE DETECTED Final   Tetrahydrocannabinol 06/03/2022 POSITIVE (A)  NONE DETECTED Final   Barbiturates 06/03/2022 NONE DETECTED  NONE DETECTED Final   Comment: (NOTE) DRUG SCREEN FOR MEDICAL PURPOSES ONLY.  IF CONFIRMATION IS NEEDED FOR ANY PURPOSE, NOTIFY LAB WITHIN 5 DAYS.  LOWEST DETECTABLE LIMITS FOR URINE DRUG SCREEN Drug Class                     Cutoff (ng/mL) Amphetamine and metabolites    1000 Barbiturate and metabolites    200 Benzodiazepine  200 Opiates and metabolites        300 Cocaine and metabolites        300 THC                            50 Performed at [facility], [address] [place], [place] [address]    SARS Coronavirus 2 by RT PCR 06/03/2022 NEGATIVE  NEGATIVE Final   Comment: (NOTE) SARS-CoV-2 target nucleic acids are NOT DETECTED.  The SARS-CoV-2 RNA is generally detectable in upper respiratory specimens during the acute phase of infection. The lowest concentration of SARS-CoV-2 viral copies this assay can detect is 138 copies/mL. A negative result does not preclude SARS-Cov-2 infection and should not be used as the sole basis for treatment or other patient management decisions. A negative result may occur with  improper specimen collection/handling, submission of specimen other than nasopharyngeal swab, presence of viral mutation(s) within the areas targeted by this assay, and inadequate number of viral copies(<138 copies/mL). A negative result must be combined with clinical observations, patient history, and epidemiological information. The expected result is Negative.  Fact Sheet for Patients:   BloggerCourse.com  Fact Sheet for Healthcare Providers:  SeriousBroker.it  This test is no                          t yet approved or cleared by the [place] FDA and  has been authorized for detection and/or diagnosis of SARS-CoV-2 by FDA under an Emergency Use Authorization (EUA). This EUA will remain  in effect (meaning this test can be used) for the duration of the COVID-19 declaration under Section 564(b)(1) of the Act, 21 U.S.C.section 360bbb-3(b)(1), unless the authorization is terminated  or revoked sooner.       Influenza A by PCR 06/03/2022 NEGATIVE  NEGATIVE Final   Influenza B by PCR 06/03/2022 NEGATIVE  NEGATIVE Final   Comment: (NOTE) The Xpert Xpress SARS-CoV-2/FLU/RSV plus assay is intended as an aid in the diagnosis of influenza from Nasopharyngeal swab specimens and should not be used as a sole basis for treatment. Nasal washings and aspirates are unacceptable for Xpert Xpress SARS-CoV-2/FLU/RSV testing.  Fact Sheet for Patients: BloggerCourse.com  Fact Sheet for Healthcare Providers: SeriousBroker.it  This test is not yet approved or cleared by the [place] FDA and has been authorized for detection and/or diagnosis of SARS-CoV-2 by FDA under an Emergency Use Authorization (EUA). This EUA will remain in effect (meaning this test can be used) for the duration of the COVID-19 declaration under Section 564(b)(1) of the Act, 21 U.S.C. section 360bbb-3(b)(1), unless the authorization is terminated or revoked.     Resp Syncytial Virus by PCR 06/03/2022 NEGATIVE  NEGATIVE Final   Comment: (NOTE) Fact Sheet for Patients: BloggerCourse.com  Fact Sheet for Healthcare Providers: SeriousBroker.it  This test is not yet approved or cleared by the [place] FDA and has been authorized for detection and/or  diagnosis of SARS-CoV-2 by FDA under an Emergency Use Authorization (EUA). This EUA will remain in effect (meaning this test can be used) for the duration of the COVID-19 declaration under Section 564(b)(1) of the Act, 21 U.S.C. section 360bbb-3(b)(1), unless the authorization is terminated or revoked.  Performed at [facility], [address], [place], [place] [address]   Admission on 04/26/2022, Discharged on 04/27/2022  Component Date Value Ref Range Status   WBC 04/26/2022 9.3  4.0 - 10.5 K/uL Final   RBC 04/26/2022 5.25  4.22 - 5.81 MIL/uL Final   Hemoglobin 04/26/2022 15.4  13.0 - 17.0 g/dL Final   HCT 23/55/7322 46.4  39.0 - 52.0 % Final   MCV 04/26/2022 88.4  80.0 - 100.0 fL Final   MCH 04/26/2022 29.3  26.0 - 34.0 pg Final   MCHC 04/26/2022 33.2  30.0 - 36.0 g/dL Final   RDW 02/54/2706 14.0  11.5 - 15.5 % Final   Platelets 04/26/2022 250  150 - 400 K/uL Final   nRBC 04/26/2022 0.0  0.0 - 0.2 % Final   Neutrophils Relative % 04/26/2022 47  % Final   Neutro Abs 04/26/2022 4.3  1.7 - 7.7 K/uL Final   Lymphocytes Relative 04/26/2022 46  % Final   Lymphs Abs 04/26/2022 4.3 (H)  0.7 - 4.0 K/uL Final   Monocytes Relative 04/26/2022 6  % Final   Monocytes Absolute 04/26/2022 0.5  0.1 - 1.0 K/uL Final   Eosinophils Relative 04/26/2022 1  % Final   Eosinophils Absolute 04/26/2022 0.1  0.0 - 0.5 K/uL Final   Basophils Relative 04/26/2022 0  % Final   Basophils Absolute 04/26/2022 0.0  0.0 - 0.1 K/uL Final   Immature Granulocytes 04/26/2022 0  % Final   Abs Immature Granulocytes 04/26/2022 0.02  0.00 - 0.07 K/uL Final   Performed at [facility], [address] [place], [place] [address]   Sodium 04/26/2022 140  135 - 145 mmol/L Final   Potassium 04/26/2022 3.8  3.5 - 5.1 mmol/L Final   Chloride 04/26/2022 105  98 - 111 mmol/L Final   CO2 04/26/2022 29  22 - 32 mmol/L Final   Glucose, Bld 04/26/2022 86  70 - 99 mg/dL Final   Glucose reference range  applies only to samples taken after fasting for at least 8 hours.   BUN 04/26/2022 10  6 - 20 mg/dL Final   Creatinine, Ser 04/26/2022 1.02  0.61 - 1.24 mg/dL Final   Calcium 83/15/1761 9.5  8.9 - 10.3 mg/dL Final   Total Protein 60/73/7106 7.0  6.5 - 8.1 g/dL Final   Albumin 26/94/8546 4.5  3.5 - 5.0 g/dL Final   AST 27/08/5007 15  15 - 41 U/L Final   ALT 04/26/2022 14  0 - 44 U/L Final   Alkaline Phosphatase 04/26/2022 48  38 - 126 U/L Final   Total Bilirubin 04/26/2022 0.6  0.3 - 1.2 mg/dL Final   GFR, Estimated 04/26/2022 >60  >60 mL/min Final   Comment: (NOTE) Calculated using the CKD-EPI Creatinine Equation (2021)    Anion gap 04/26/2022 6  5 - 15 Final   Performed at [facility], [address] [place], [place] [address]   Alcohol, Ethyl (B) 04/26/2022 <10  <10 mg/dL Final   Comment: (NOTE) Lowest detectable limit for serum alcohol is 10 mg/dL.  For medical purposes only. Performed at [facility], [address], [place], [place] [address]    Opiates 04/26/2022 NONE DETECTED  NONE DETECTED Final   Cocaine 04/26/2022 NONE DETECTED  NONE DETECTED Final   Benzodiazepines 04/26/2022 NONE DETECTED  NONE DETECTED Final   Amphetamines 04/26/2022 POSITIVE (A)  NONE DETECTED Final   Tetrahydrocannabinol 04/26/2022 POSITIVE (A)  NONE DETECTED Final   Barbiturates 04/26/2022 NONE DETECTED  NONE DETECTED Final   Comment: (NOTE) DRUG SCREEN FOR MEDICAL PURPOSES ONLY.  IF CONFIRMATION IS NEEDED FOR ANY PURPOSE, NOTIFY LAB WITHIN 5 DAYS.  LOWEST DETECTABLE LIMITS FOR URINE DRUG SCREEN Drug Class                     Cutoff (ng/mL) Amphetamine and metabolites    1000 Barbiturate and metabolites    200 Benzodiazepine                 200 Opiates and metabolites        300 Cocaine and metabolites        300 THC                            50 Performed at [facility], [address], [place], [place] [address]     Blood  Alcohol level:  Lab Results  Component Value Date   ETH <10 09/16/2022   ETH <10 08/18/2022    Metabolic Disorder Labs: Lab Results  Component Value Date   HGBA1C 4.9 07/04/2022   MPG 93.93 07/04/2022   MPG 93.93 11/10/2021   No results found for: "PROLACTIN" Lab Results  Component Value Date   CHOL 150 07/04/2022   TRIG 106 07/04/2022   HDL 50 07/04/2022   CHOLHDL 3.0 07/04/2022   VLDL 21 07/04/2022   LDLCALC 79 07/04/2022   LDLCALC 55 11/10/2021    Therapeutic Lab Levels: Lab Results  Component Value Date   LITHIUM <0.06 (L) 09/17/2022   LITHIUM <0.06 (L) 07/21/2022   Lab Results  Component Value Date   VALPROATE <10 (L) 09/16/2022   VALPROATE 55 08/15/2022   No results found for: "CBMZ"  Physical Findings   AIMS    Flowsheet Row [facility]) [facility] Ad[facility]) [facility]  AIMS Total Score 0 0      AUDIT    Flowsheet Row [facility]) [facility] Ad[facility] (Dis[facility]  Alcohol Use Disorder Identification Test Final Score (AUDIT) 0 0      PHQ2-9    Flowsheet Row ED from 09/17/2022 in [facility] ED from 11/05/2021 in [facility] ED from 11/04/2021 in [facility] Department at [facility] ED from 11/03/2021 in [facility] Emergency Department at [facility]  PHQ-2 Total Score 5 4 6 6   PHQ-9 Total Score 24 25 24 25       Flowsheet Row ED from 09/17/2022 in [facility] ED from 09/16/2022 in [facility] Emergency Department at [facility] ED from 08/18/2022 in [facility] Emergency Department at [facility]  C-SSRS RISK CATEGORY Low Risk High Risk High Risk        Musculoskeletal   Strength & Muscle Tone: within normal limits Gait & Station: normal Patient leans: N/A  Psychiatric Specialty Exam  Presentation  General Appearance:  Disheveled  Eye Contact: Fair  Speech: Clear and Coherent; Normal Rate  Speech Volume: Normal  Handedness: Right   Mood and Affect  Mood: Depressed  Affect: Congruent   Thought Process  Thought Processes: Coherent  Descriptions of Associations:Intact  Orientation:Full (Time, Place and Person)  Thought Content:Logical  Diagnosis of Schizophrenia or Schizoaffective disorder in past: Yes (Pt reports that he has been told that he has diagnosis of schizoffective d/o)  Duration of Psychotic Symptoms: Greater than six months   Hallucinations:Hallucinations:  None  Ideas of Reference:None  Suicidal Thoughts:Suicidal Thoughts: No  Homicidal Thoughts:Homicidal Thoughts: No   Sensorium  Memory: Immediate Good; Recent Good; Remote Good  Judgment: Fair  Insight: Fair   Art therapist  Concentration: Good  Attention Span: Good  Recall: Good  Fund of Knowledge: Good  Language: Good   Psychomotor Activity  Psychomotor Activity: Psychomotor Activity: Normal   Assets  Assets: Communication Skills; Desire for Improvement; Leisure Time; Physical Health; Resilience   Sleep  Sleep: Sleep: Fair   No data recorded  Physical Exam  Physical Exam Vitals reviewed.  Constitutional:      General: He is not in acute distress.    Appearance: He is not toxic-appearing.  HENT:     Head: Normocephalic.  Pulmonary:     Effort: Pulmonary effort is normal.  Neurological:     General: No focal deficit present.     Mental Status: He is alert.     Motor: No weakness.     Gait: Gait normal.    Review of Systems  Gastrointestinal:  Positive for abdominal pain and nausea.  Neurological:  Positive for dizziness and headaches.   Blood pressure 125/87, pulse 92, temperature 97.9 F (36.6 C),  temperature source Oral, resp. rate 18, SpO2 96 %. There is no height or weight on file to calculate BMI.  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 Patient endorses most symptoms asked, whether psychiatric or physiologic. No medication changes to be made this admission. LCSW to assist with residential treatment program.   Continue PTA medications for PTSD and mood stability -Zyprexa 5 mg qAM and 15 mg qHS -Prazosin 2 mg qHS  Dispo: Pending  Lamar Sprinkles, MD 09/20/2022 12:16 PM

## 2022-09-20 NOTE — Tx Team (Signed)
LCSW met with patient to assess current mood, affect, physical state, and inquire about needs/goals while here in [facility] and after discharge. Patient reports he presented due to being suicidal with a plan to walk in front of a train. Patient reports he has been feeling suicidal for the last couple of weeks. Patient reports having a history SI with plans to walk into traffic, walk in front of a train, and prior attempt at overdosing on Seroquel. Patient reports having over 18 mental health hospitalizations within the past 2 years. Patient reports the most recent admission was in February at [facility] with plan to walk in front of a train. When asked if he currently feels suicidal, patient reports "off and on, it's hard not to feel that way". LCSW explored HI and patient stated he had HI about a month ago, however it was not towards a specific person.  Patient reports auditory, visual, tactile, and olfactory hallucinations at times. Patient reports he sometimes smells drugs and meth, and sometimes feels like there are hands touching him. Patient currently denies these hallucinations. Patient reports his current stressors is probation and parole, homelessness, not being on his past meds, and his mother being in a nursing home. LCSW attempted explore reason for probation and parole, and patient stated "I don't remember what the charges are for". Later during the assessment, patient reported he is registered as a sex offender. Patient reports he is originally from [place], [place]. Patient reports he was sent to [facility] after being accept into a sober living home where he stayed for 10 months. Patient reports after being discharged from that home he went to [facility] where he has stayed for the past 7 months. Patient reports he would consider himself homeless and reports he has been for the last 3 years. Patient reports he does not work or receive any income. Patient reports he has applied for for  disability multiple times, however has been denied twice this year. Patient reports his support system is his PO Lunette Stands [phone] and provided LCSW permission to speak with her. Patient reports his current goal is to get into [facility] to learn the tools needed to stay sober. LCSW explored current use and patient reports he drank some alcohol a couple of days ago stating about two 40 ounce beers. Patient reports it has been a couple of months since he has used meth. Patient reports he admitted to [facility] about 2 months ago and then discharged after 21 days. Patient reports he would need to stay in [facility] for treatment. LCSW explored if the patient had any probation restrictions and the patient stated he has to stay in [place]. Patient aware that LCSW will send referrals out for review and will follow up to provide updates as received. Patient expressed understanding and appreciation of LCSW assistance. No other needs were reported at this time by patient.   Referral has been sent to [facility] and [facility] for review. LCSW will provide updates as received.   Fernande Boyden, LCSW Clinical Social Worker [facility] Ph: [phone]

## 2022-09-20 NOTE — ED Notes (Signed)
Patient A&Ox4. Patient denies SI/Hi and AVH. Patient complained of tooth ache, tylenol and Orajel given and was effective. No acute distress noted. Support and encouragement provided. Routine safety checks conducted according to facility protocol. Encouraged patient to notify staff if thoughts of harm toward self or others arise. Patient verbalize understanding and agreement. Will continue to monitor for safety.

## 2022-09-21 DIAGNOSIS — R440 Auditory hallucinations: Secondary | ICD-10-CM | POA: Diagnosis not present

## 2022-09-21 DIAGNOSIS — F32A Depression, unspecified: Secondary | ICD-10-CM | POA: Diagnosis not present

## 2022-09-21 DIAGNOSIS — Z9151 Personal history of suicidal behavior: Secondary | ICD-10-CM | POA: Diagnosis not present

## 2022-09-21 DIAGNOSIS — R45851 Suicidal ideations: Secondary | ICD-10-CM | POA: Diagnosis not present

## 2022-09-21 MED ORDER — HYDROXYZINE PAMOATE 25 MG PO CAPS: 25.0000 mg | ORAL_CAPSULE | Freq: Four times a day (QID) | ORAL | 0 refills | Status: DC | PRN

## 2022-09-21 MED ORDER — OLANZAPINE 15 MG PO TABS: 15.0000 mg | ORAL_TABLET | Freq: Every day | ORAL | 0 refills | Status: DC

## 2022-09-21 MED ORDER — OLANZAPINE 5 MG PO TABS: 5.0000 mg | ORAL_TABLET | Freq: Every morning | ORAL | 0 refills | Status: DC

## 2022-09-21 MED ORDER — PRAZOSIN HCL 2 MG PO CAPS: 2.0000 mg | ORAL_CAPSULE | Freq: Every day | ORAL | 0 refills | Status: DC

## 2022-09-21 MED ORDER — ALBUTEROL SULFATE HFA 108 (90 BASE) MCG/ACT IN AERS: 2.0000 | INHALATION_SPRAY | Freq: Four times a day (QID) | RESPIRATORY_TRACT | 0 refills | Status: DC | PRN

## 2022-09-21 MED ORDER — ALBUTEROL SULFATE HFA 108 (90 BASE) MCG/ACT IN AERS
2.0000 | INHALATION_SPRAY | Freq: Four times a day (QID) | RESPIRATORY_TRACT | Status: DC | PRN
  Filled 2022-09-21: qty 6.7

## 2022-09-21 NOTE — ED Notes (Signed)
Discharge instructions provided and Pt stated understanding. Pt alert, orient and ambulatory prior to d/c from facility. Personal belongings returned from locker number 11. [employer] called for transportation services to [employer]. Patient allowed to change into personal clothes in the locker room prior to being escorted to the lobby to meet the taxi. [facility]. Pulse survey and safety sheet filled out yesterday. Prescription samples and prescriptions supplied. Safety maintained.

## 2022-09-21 NOTE — ED Notes (Signed)
Patient was provided with breakfast 

## 2022-09-21 NOTE — ED Provider Notes (Signed)
FBC/OBS ASAP Discharge Summary  Date and Time: 09/21/2022 7:05 AM  Name: Jon Clayton  MRN:  [id number]   Discharge Diagnoses:  Final diagnoses:  Depression, unspecified depression type    Subjective: Jon RipaJamie Felter is a 27 year old male with a psychiatric history of MDD, cannabis use disorder, PTSD, antisocial behavior, personality disorder, polysubstance abuse who presented for suicidal ideation and assistance with detox and substance use treatment, [facility]   Stay Summary: The patient was evaluated each day by a clinical provider to ascertain response to treatment. Improvement was noted by the patient's report of decreasing symptoms, improved sleep and appetite, affect, medication tolerance, behavior, and participation in unit programming.  Patient was asked each day to complete a self inventory noting mood, mental status, pain, new symptoms, anxiety and concerns.   Patient responded well to medication and being in a therapeutic and supportive environment. Positive and appropriate behavior was noted and the patient was motivated for recovery. The patient worked closely with the treatment team and case manager to develop a discharge plan with appropriate goals. Coping skills, problem solving as well as relaxation therapies were also part of the unit programming.   By the day of discharge patient was in much improved condition than upon admission.  Symptoms were reported as significantly decreased or resolved completely. The patient denied SI/HI and voiced no AVH. The patient was motivated to continue taking medication with a goal of continued improvement in mental health.    Total Time spent with patient: 30 minutes  Past Psychiatric History: MDD, cannabis use disorder, PTSD, antisocial behavior, personality disorder, polysubstance abuse   Past Medical History: Fibromyalgia, poly substance abuse, Vit. D deficiency  Family History: None reported Social History: Homeless,  Unemployed, Single. Registered sex offender. Tobacco Cessation:  A prescription for an FDA-approved tobacco cessation medication provided at discharge  Current Medications:  Current Facility-Administered Medications  Medication Dose Route Frequency Provider Last Rate Last Admin   acetaminophen (TYLENOL) tablet 650 mg  650 mg Oral Q6H PRN Ajibola, Ene A, NP   650 mg at 09/20/22 2049   alum & mag hydroxide-simeth (MAALOX/MYLANTA) 200-200-20 MG/5ML suspension 30 mL  30 mL Oral Q4H PRN Ajibola, Ene A, NP       hydrOXYzine (ATARAX) tablet 25 mg  25 mg Oral TID PRN Ajibola, Ene A, NP   25 mg at 09/20/22 2049   ibuprofen (ADVIL) tablet 600 mg  600 mg Oral Q6H PRN Lamar Sprinklesosby, Birt Reinoso, MD   600 mg at 09/21/22 0054   magnesium hydroxide (MILK OF MAGNESIA) suspension 30 mL  30 mL Oral Daily PRN Ajibola, Ene A, NP       OLANZapine (ZYPREXA) tablet 15 mg  15 mg Oral QHS Ajibola, Ene A, NP   15 mg at 09/20/22 2048   OLANZapine (ZYPREXA) tablet 5 mg  5 mg Oral q AM Ajibola, Ene A, NP   5 mg at 09/21/22 [id number]   prazosin (MINIPRESS) capsule 2 mg  2 mg Oral QHS Ajibola, Ene A, NP   2 mg at 09/20/22 2048   traZODone (DESYREL) tablet 50 mg  50 mg Oral QHS PRN Ajibola, Ene A, NP   50 mg at 09/20/22 2049   Current Outpatient Medications  Medication Sig Dispense Refill   acetaminophen (TYLENOL) 500 MG tablet Take 1,000 mg by mouth every 6 (six) hours as needed for mild pain or headache.     albuterol (VENTOLIN HFA) 108 (90 Base) MCG/ACT inhaler Inhale 2  puffs into the lungs every 6 (six) hours as needed for wheezing or shortness of breath. 6.7 g 0   divalproex (DEPAKOTE) 500 MG DR tablet Take 500 mg by mouth 2 (two) times daily.     hydrOXYzine (VISTARIL) 25 MG capsule Take 25 mg by mouth every 6 (six) hours as needed for anxiety.     lithium carbonate (LITHOBID) 300 MG ER tablet Take 300 mg by mouth 3 (three) times daily.     OLANZapine (ZYPREXA) 15 MG tablet Take 15 mg by mouth at bedtime.     OLANZapine (ZYPREXA) 5  MG tablet Take 5 mg by mouth in the morning.     prazosin (MINIPRESS) 2 MG capsule Take 2 mg by mouth at bedtime.     QUEtiapine (SEROQUEL) 100 MG tablet Take 100 mg by mouth at bedtime.     QUEtiapine (SEROQUEL) 200 MG tablet Take 200 mg by mouth every 8 (eight) hours.     Vitamin D, Ergocalciferol, (DRISDOL) 1.25 MG (50000 UNIT) CAPS capsule Take 1 capsule (50,000 Units total) by mouth every 7 (seven) days. (Patient not taking: Reported on 09/16/2022) 4 capsule 2    PTA Medications:  Facility Ordered Medications  Medication   acetaminophen (TYLENOL) tablet 650 mg   alum & mag hydroxide-simeth (MAALOX/MYLANTA) 200-200-20 MG/5ML suspension 30 mL   magnesium hydroxide (MILK OF MAGNESIA) suspension 30 mL   hydrOXYzine (ATARAX) tablet 25 mg   traZODone (DESYREL) tablet 50 mg   OLANZapine (ZYPREXA) tablet 15 mg   OLANZapine (ZYPREXA) tablet 5 mg   prazosin (MINIPRESS) capsule 2 mg   [COMPLETED] benzocaine (ORAJEL) 10 % mucosal gel 1 Application   [COMPLETED] benzocaine (ORAJEL) 10 % mucosal gel 1 Application   ibuprofen (ADVIL) tablet 600 mg   PTA Medications  Medication Sig   albuterol (VENTOLIN HFA) 108 (90 Base) MCG/ACT inhaler Inhale 2 puffs into the lungs every 6 (six) hours as needed for wheezing or shortness of breath.   prazosin (MINIPRESS) 2 MG capsule Take 2 mg by mouth at bedtime.   QUEtiapine (SEROQUEL) 100 MG tablet Take 100 mg by mouth at bedtime.   Vitamin D, Ergocalciferol, (DRISDOL) 1.25 MG (50000 UNIT) CAPS capsule Take 1 capsule (50,000 Units total) by mouth every 7 (seven) days. (Patient not taking: Reported on 09/16/2022)   QUEtiapine (SEROQUEL) 200 MG tablet Take 200 mg by mouth every 8 (eight) hours.   hydrOXYzine (VISTARIL) 25 MG capsule Take 25 mg by mouth every 6 (six) hours as needed for anxiety.   lithium carbonate (LITHOBID) 300 MG ER tablet Take 300 mg by mouth 3 (three) times daily.   acetaminophen (TYLENOL) 500 MG tablet Take 1,000 mg by mouth every 6 (six)  hours as needed for mild pain or headache.   OLANZapine (ZYPREXA) 15 MG tablet Take 15 mg by mouth at bedtime.   OLANZapine (ZYPREXA) 5 MG tablet Take 5 mg by mouth in the morning.       09/20/2022   10:21 PM 09/19/2022    5:57 PM 11/08/2021   11:57 AM  Depression screen PHQ 2/9  Decreased Interest 2 3 2   Down, Depressed, Hopeless 2 2 2   PHQ - 2 Score 4 5 4   Altered sleeping 2 3 3   Tired, decreased energy 2 2 3   Change in appetite  3 3  Feeling bad or failure about yourself  3 3 3   Trouble concentrating 2 2 3   Moving slowly or fidgety/restless 2 3 3   Suicidal thoughts 2 3 3  PHQ-9 Score 17 24 25   Difficult doing work/chores  Very difficult Very difficult    Flowsheet Row ED from 09/17/2022 in [facility] ED from 09/16/2022 in [facility] Emergency Department at [facility] ED from 08/18/2022 in [facility] Emergency Department at [facility]  C-SSRS RISK CATEGORY Low Risk High Risk High Risk       Musculoskeletal  Strength & Muscle Tone: within normal limits Gait & Station: normal Patient leans: N/A  Psychiatric Specialty Exam  Presentation  General Appearance:  Disheveled   Eye Contact: Fair   Speech: Clear and Coherent; Normal Rate   Speech Volume: Normal   Handedness: Right     Mood and Affect  Mood: Depressed   Affect: Congruent     Thought Process  Thought Processes: Coherent   Descriptions of Associations:Intact   Orientation:Full (Time, Place and Person)   Thought Content:Logical  Diagnosis of Schizophrenia or Schizoaffective disorder in past: Yes (Pt reports that he has been told that he has diagnosis of schizoffective d/o)  Duration of Psychotic Symptoms: Greater than six months    Hallucinations:Hallucinations: None   Ideas of Reference:None   Suicidal Thoughts:Suicidal Thoughts: No   Homicidal Thoughts:Homicidal Thoughts: No     Sensorium  Memory: Immediate Good; Recent Good; Remote  Good   Judgment: Fair   Insight: Fair     Art therapist  Concentration: Good   Attention Span: Good   Recall: Good   Fund of Knowledge: Good   Language: Good     Psychomotor Activity  Psychomotor Activity: Psychomotor Activity: Normal     Assets  Assets: Communication Skills; Desire for Improvement; Leisure Time; Physical Health; Resilience     Sleep  Sleep: Sleep: Fair  Physical Exam  Physical Exam Vitals reviewed.  Constitutional:      General: He is not in acute distress.    Appearance: He is not toxic-appearing.  HENT:     Head: Normocephalic.  Pulmonary:     Effort: Pulmonary effort is normal.  Neurological:     General: No focal deficit present.     Mental Status: He is alert.     Motor: No weakness.     Gait: Gait normal.      Review of Systems  Gastrointestinal:  Positive for abdominal pain and nausea.  Neurological:  Negative  Blood pressure 128/83, pulse 98, temperature 98 F (36.7 C), temperature source Oral, resp. rate 16, SpO2 98 %. There is no height or weight on file to calculate BMI.  Demographic Factors:  Male, Adolescent or young adult, Caucasian, Low socioeconomic status, Living alone, and Unemployed  Loss Factors: Decrease in vocational status, Legal issues, and Financial problems/change in socioeconomic status  Historical Factors: Prior suicide attempts, Family history of mental illness or substance abuse, and Impulsivity  Risk Reduction Factors:   Sense of responsibility to family  Continued Clinical Symptoms:  Alcohol/Substance Abuse/Dependencies Personality Disorders:   Cluster B Comorbid alcohol abuse/dependence  Cognitive Features That Contribute To Risk:  None    Suicide Risk:  Mild: There are no identifiable plans, no associated intent, mild dysphoria and related symptoms, good self-control (both objective and subjective assessment), few other risk factors, and identifiable protective factors,  including available and accessible social support.  Plan Of Care/Follow-up recommendations:  Follow-up recommendations:  Activity:  Normal, as tolerated Diet:  Per PCP recommendation  Patient is instructed prior to discharge to: Take all medications as prescribed by his mental healthcare provider. Report any  adverse effects and/or reactions from the medicines to his outpatient provider promptly. Patient has been instructed & cautioned: To not engage in alcohol and or illegal drug use while on prescription medicines.  In the event of worsening symptoms, patient is instructed to call the crisis hotline at 988, 911 and or go to the nearest ED for appropriate evaluation and treatment of symptoms. To follow-up with his primary care provider for your other medical issues, concerns and or health care needs.   Disposition: Trudie Buckler, MD 09/21/2022, 7:05 AM

## 2022-09-21 NOTE — Discharge Instructions (Signed)
Follow-up recommendations:  Activity:  Normal, as tolerated Diet:  Per PCP recommendation  Patient is instructed prior to discharge to: Take all medications as prescribed by his mental healthcare provider. Report any adverse effects and/or reactions from the medicines to his outpatient provider promptly. Patient has been instructed & cautioned: To not engage in alcohol and or illegal drug use while on prescription medicines.  In the event of worsening symptoms, patient is instructed to call the crisis hotline at 988, 911 and or go to the nearest ED for appropriate evaluation and treatment of symptoms. To follow-up with his primary care provider for your other medical issues, concerns and or health care needs.  

## 2022-09-21 NOTE — ED Notes (Signed)
Pt came up to nurses station saying his tooth hurt. RN notified.

## 2022-10-04 ENCOUNTER — Telehealth: Payer: Medicaid Other | Admitting: Physician Assistant

## 2022-10-04 ENCOUNTER — Encounter: Payer: Self-pay | Admitting: Physician Assistant

## 2022-10-04 DIAGNOSIS — F4310 Post-traumatic stress disorder, unspecified: Secondary | ICD-10-CM

## 2022-10-04 DIAGNOSIS — F1010 Alcohol abuse, uncomplicated: Secondary | ICD-10-CM

## 2022-10-04 DIAGNOSIS — F250 Schizoaffective disorder, bipolar type: Secondary | ICD-10-CM | POA: Diagnosis not present

## 2022-10-04 DIAGNOSIS — F1210 Cannabis abuse, uncomplicated: Secondary | ICD-10-CM

## 2022-10-04 DIAGNOSIS — F17210 Nicotine dependence, cigarettes, uncomplicated: Secondary | ICD-10-CM

## 2022-10-04 DIAGNOSIS — F1510 Other stimulant abuse, uncomplicated: Secondary | ICD-10-CM | POA: Diagnosis not present

## 2022-10-04 DIAGNOSIS — J4520 Mild intermittent asthma, uncomplicated: Secondary | ICD-10-CM

## 2022-10-04 DIAGNOSIS — K0889 Other specified disorders of teeth and supporting structures: Secondary | ICD-10-CM

## 2022-10-04 DIAGNOSIS — E559 Vitamin D deficiency, unspecified: Secondary | ICD-10-CM

## 2022-10-04 DIAGNOSIS — Z720 Tobacco use: Secondary | ICD-10-CM

## 2022-10-04 MED ORDER — HYDROXYZINE PAMOATE 25 MG PO CAPS
25.0000 mg | ORAL_CAPSULE | Freq: Four times a day (QID) | ORAL | 0 refills | Status: DC | PRN
Start: 2022-10-04 — End: 2022-10-04

## 2022-10-04 MED ORDER — ALBUTEROL SULFATE HFA 108 (90 BASE) MCG/ACT IN AERS
2.0000 | INHALATION_SPRAY | Freq: Four times a day (QID) | RESPIRATORY_TRACT | 0 refills | Status: DC | PRN
Start: 2022-10-04 — End: 2023-05-01

## 2022-10-04 MED ORDER — PRAZOSIN HCL 2 MG PO CAPS: 2.0000 mg | ORAL_CAPSULE | Freq: Every day | ORAL | 0 refills | Status: DC

## 2022-10-04 MED ORDER — OLANZAPINE 5 MG PO TABS
ORAL_TABLET | ORAL | 0 refills | Status: DC
Start: 2022-10-04 — End: 2022-10-04

## 2022-10-04 MED ORDER — METRONIDAZOLE 500 MG PO TABS
500.0000 mg | ORAL_TABLET | Freq: Three times a day (TID) | ORAL | 0 refills | Status: AC
Start: 2022-10-04 — End: 2022-10-11

## 2022-10-04 MED ORDER — VITAMIN D (ERGOCALCIFEROL) 1.25 MG (50000 UNIT) PO CAPS
50000.0000 [IU] | ORAL_CAPSULE | ORAL | 2 refills | Status: DC
Start: 2022-10-04 — End: 2023-03-21

## 2022-10-04 MED ORDER — HYDROXYZINE PAMOATE 25 MG PO CAPS
25.0000 mg | ORAL_CAPSULE | Freq: Four times a day (QID) | ORAL | 1 refills | Status: AC | PRN
Start: 2022-10-04 — End: 2022-11-03

## 2022-10-04 MED ORDER — OLANZAPINE 5 MG PO TABS: ORAL_TABLET | ORAL | 1 refills | Status: DC

## 2022-10-04 MED ORDER — PRAZOSIN HCL 2 MG PO CAPS: 2.0000 mg | ORAL_CAPSULE | Freq: Every day | ORAL | 1 refills | Status: DC

## 2022-10-04 MED ORDER — LEVOFLOXACIN 750 MG PO TABS
750.0000 mg | ORAL_TABLET | Freq: Every day | ORAL | 0 refills | Status: AC
Start: 2022-10-04 — End: 2022-10-11

## 2022-10-04 MED ORDER — NICOTINE 21 MG/24HR TD PT24: 21.0000 mg | MEDICATED_PATCH | Freq: Every day | TRANSDERMAL | 0 refills | Status: DC

## 2022-10-04 MED ORDER — ALBUTEROL SULFATE HFA 108 (90 BASE) MCG/ACT IN AERS
2.0000 | INHALATION_SPRAY | Freq: Four times a day (QID) | RESPIRATORY_TRACT | 0 refills | Status: DC | PRN
Start: 2022-10-04 — End: 2022-10-04

## 2022-10-04 NOTE — Progress Notes (Signed)
Established Patient Office Visit  Subjective   Patient ID: Jon Clayton, male    DOB: Oct 16, 1995  Age: 27 y.o. MRN: [id number]  Chief Complaint  Patient presents with   Medication Refill  Virtual Visit via Video Note  I connected with Jon Clayton on 10/04/22 at 10:00 AM EDT by a video enabled telemedicine application and verified that I am speaking with the correct person using two identifiers.  Unable to connect to video due to technical difficulties, patient agreeable to continue with telephone visit.  Location: Patient: Jon Clayton residential treatment center Provider: [facility] Medicine Unit    I discussed the limitations of evaluation and management by telemedicine and the availability of in person appointments. The patient expressed understanding and agreed to proceed.  History of Present Illness:   States that he is currently being treated for substance abuse at [facility] residential treatment center, states that he arrived on April 10, states that he is working on having an appointment in [place] upon completion of his treatment.  States that he has been having difficulty sleeping, states that he wakes up several times throughout the night, but states that he is able to fall back asleep.  States that he is sleeping approximately 5 to 6 hours a night.  States that his moods have been "up-and-down" but does feel that they are improving.  States that he continues to have difficulty with dental pain.  States that he has had swelling on the left side of his mouth, both top and bottom.  States that he has broken teeth any cavities.  States that he has Medicaid and does plan on finding a dentist after he completes his program.  States that he has been using ibuprofen without much relief.      Observations/Objective: Medical history and current medications reviewed, no physical exam completed  Past Medical History:  Diagnosis Date   Depression    Psychosis    PTSD  (post-traumatic stress disorder)    Social History   Socioeconomic History   Marital status: Single    Spouse name: Not on file   Number of children: Not on file   Years of education: Not on file   Highest education level: 8th grade  Occupational History   Not on file  Tobacco Use   Smoking status: Every Day    Packs/day: 1.00    Years: 11.00    Additional pack years: 0.00    Total pack years: 11.00    Types: Cigarettes   Smokeless tobacco: Never  Vaping Use   Vaping Use: Never used  Substance and Sexual Activity   Alcohol use: Not Currently    Comment: reports periodic alcohol use   Drug use: Yes    Frequency: 1.0 times per week    Types: Marijuana    Comment: 1/3 gm daily   Sexual activity: Not Currently  Other Topics Concern   Not on file  Social History Narrative   Not on file   Social Determinants of Health   Financial Resource Strain: Not on file  Food Insecurity: Food Insecurity Present (09/19/2022)   Hunger Vital Sign    Worried About Running Out of Food in the Last Year: Often true    Ran Out of Food in the Last Year: Often true  Transportation Needs: Unmet Transportation Needs (09/19/2022)   PRAPARE - Administrator, Civil Service (Medical): Yes    Lack of Transportation (Non-Medical): Yes  Physical Activity: Not on file  Stress: Not on file  Social Connections: Not on file  Intimate Partner Violence: Not At Risk (09/19/2022)   Humiliation, Afraid, Rape, and Kick questionnaire    Fear of Current or Ex-Partner: No    Emotionally Abused: No    Physically Abused: No    Sexually Abused: No   History reviewed. No pertinent family history. Allergies  Allergen Reactions   Amoxicillin Anaphylaxis and Rash   Penicillins Anaphylaxis and Rash   Other Swelling and Other (See Comments)    Pt said he is allergic to tapioca. Causes big knots to appear on his tongue.   Pertussis Vaccines Hives and Other (See Comments)    Per MD, said he was allergic to a  medication in the vaccine   Tape Other (See Comments)    Bandages, once removed, leave the skin swollen/puffy/irritated where they were placed   Blue Mussel [Mytilus] Itching, Swelling, Rash and Other (See Comments)    Itchy throat    Review of Systems  Constitutional:  Negative for chills and fever.  HENT: Negative.    Eyes: Negative.   Respiratory:  Negative for shortness of breath.   Cardiovascular:  Negative for chest pain.  Gastrointestinal: Negative.   Genitourinary: Negative.   Musculoskeletal: Negative.   Skin: Negative.   Neurological: Negative.   Endo/Heme/Allergies: Negative.   Psychiatric/Behavioral: Negative.         Assessment & Plan:   Problem List Items Addressed This Visit       Respiratory   Mild intermittent asthma without complication   Relevant Medications   albuterol (VENTOLIN HFA) 108 (90 Base) MCG/ACT inhaler     Other   PTSD (post-traumatic stress disorder) (Chronic)   Relevant Medications   hydrOXYzine (VISTARIL) 25 MG capsule   prazosin (MINIPRESS) 2 MG capsule   Marijuana abuse   Methamphetamine abuse   Tobacco abuse   Relevant Medications   nicotine (NICODERM CQ - DOSED IN MG/24 HOURS) 21 mg/24hr patch   Vitamin D deficiency   Relevant Medications   Vitamin D, Ergocalciferol, (DRISDOL) 1.25 MG (50000 UNIT) CAPS capsule   Schizoaffective disorder, bipolar type - Primary   Relevant Medications   OLANZapine (ZYPREXA) 5 MG tablet   Alcohol abuse   Other Visit Diagnoses     Pain, dental       Relevant Medications   levofloxacin (LEVAQUIN) 750 MG tablet   metroNIDAZOLE (FLAGYL) 500 MG tablet      Assessment and Plan: 1. Schizoaffective disorder, bipolar type Current regimen.  Encouraged to follow-up with mobile unit in 2 weeks. - OLANZapine (ZYPREXA) 5 MG tablet; Take 1 PO QAM and 3 PO QHS  Dispense: 120 tablet; Refill: 1  2. PTSD (post-traumatic stress disorder) Sinew current regimen - hydrOXYzine (VISTARIL) 25 MG capsule;  Take 1 capsule (25 mg total) by mouth every 6 (six) hours as needed for anxiety.  Dispense: 30 capsule; Refill: 1 - prazosin (MINIPRESS) 2 MG capsule; Take 1 capsule (2 mg total) by mouth at bedtime.  Dispense: 30 capsule; Refill: 1  3. Pain, dental Trial Levaquin, Flagyl.  Penicillin allergy.  Patient encouraged to follow-up with dentistry as soon as able.  Patient education given on supportive care - levofloxacin (LEVAQUIN) 750 MG tablet; Take 1 tablet (750 mg total) by mouth daily for 7 days.  Dispense: 7 tablet; Refill: 0 - metroNIDAZOLE (FLAGYL) 500 MG tablet; Take 1 tablet (500 mg total) by mouth 3 (three) times daily for 7 days.  Dispense: 21 tablet; Refill: 0  4.  Mild intermittent asthma without complication Continue current regimen - albuterol (VENTOLIN HFA) 108 (90 Base) MCG/ACT inhaler; Inhale 2 puffs into the lungs every 6 (six) hours as needed for wheezing or shortness of breath.  Dispense: 6.7 g; Refill: 0  5. Vitamin D deficiency Restart regimen - Vitamin D, Ergocalciferol, (DRISDOL) 1.25 MG (50000 UNIT) CAPS capsule; Take 1 capsule (50,000 Units total) by mouth every 7 (seven) days.  Dispense: 4 capsule; Refill: 2  6. Alcohol abuse Currently in substance abuse treatment program  7. Methamphetamine abuse   8. Marijuana abuse   9. Tobacco abuse Current regimen - nicotine (NICODERM CQ - DOSED IN MG/24 HOURS) 21 mg/24hr patch; Place 1 patch (21 mg total) onto the skin daily.  Dispense: 28 patch; Refill: 0   Follow Up Instructions:    I discussed the assessment and treatment plan with the patient. The patient was provided an opportunity to ask questions and all were answered. The patient agreed with the plan and demonstrated an understanding of the instructions.   The patient was advised to call back or seek an in-person evaluation if the symptoms worsen or if the condition fails to improve as anticipated.  I provided 21 minutes of non-face-to-face time during this  encounter.  No AVS was created, patient currently does not have access to MyChart.  Patient given through teach back method.    Return in about 2 weeks (around 10/18/2022) for with [facility].    Kasandra Knudsen Mayers, PA-C

## 2022-10-31 ENCOUNTER — Encounter: Payer: Self-pay | Admitting: Physician Assistant

## 2022-10-31 ENCOUNTER — Ambulatory Visit: Payer: Medicaid Other | Admitting: Physician Assistant

## 2022-10-31 VITALS — BP 128/79 | HR 85 | Ht 67.0 in | Wt 226.0 lb

## 2022-10-31 DIAGNOSIS — E559 Vitamin D deficiency, unspecified: Secondary | ICD-10-CM

## 2022-10-31 DIAGNOSIS — Z720 Tobacco use: Secondary | ICD-10-CM

## 2022-10-31 DIAGNOSIS — K0889 Other specified disorders of teeth and supporting structures: Secondary | ICD-10-CM | POA: Diagnosis not present

## 2022-10-31 DIAGNOSIS — F1510 Other stimulant abuse, uncomplicated: Secondary | ICD-10-CM

## 2022-10-31 DIAGNOSIS — F4310 Post-traumatic stress disorder, unspecified: Secondary | ICD-10-CM

## 2022-10-31 DIAGNOSIS — F1210 Cannabis abuse, uncomplicated: Secondary | ICD-10-CM

## 2022-10-31 DIAGNOSIS — F250 Schizoaffective disorder, bipolar type: Secondary | ICD-10-CM

## 2022-10-31 DIAGNOSIS — F1010 Alcohol abuse, uncomplicated: Secondary | ICD-10-CM

## 2022-10-31 DIAGNOSIS — F17210 Nicotine dependence, cigarettes, uncomplicated: Secondary | ICD-10-CM

## 2022-10-31 MED ORDER — IBUPROFEN 800 MG PO TABS
800.0000 mg | ORAL_TABLET | Freq: Three times a day (TID) | ORAL | 0 refills | Status: DC | PRN
Start: 2022-10-31 — End: 2023-05-01

## 2022-10-31 MED ORDER — LEVOFLOXACIN 750 MG PO TABS
750.0000 mg | ORAL_TABLET | Freq: Every day | ORAL | 0 refills | Status: AC
Start: 2022-10-31 — End: 2022-11-07

## 2022-10-31 MED ORDER — METRONIDAZOLE 500 MG PO TABS
500.0000 mg | ORAL_TABLET | Freq: Three times a day (TID) | ORAL | 0 refills | Status: AC
Start: 2022-10-31 — End: 2022-11-07

## 2022-10-31 NOTE — Patient Instructions (Signed)
To help with your dental pain, you are going to repeat the antibiotic regimen of Levaquin and Flagyl.  I started a referral for you to be seen by dentistry.  I also sent a prescription for ibuprofen 800 mg, you can use this every 8 hours as needed to help with the pain.  Roney Jaffe, PA-C Physician Assistant [facility] [email or web address]   Dental Abscess  A dental abscess is an infection around a tooth that may involve pain, swelling, and a collection of pus, as well as other symptoms. Treatment is important to help with symptoms and to prevent the infection from spreading. The general types of dental abscesses are: Pulpal abscess. This abscess may form from the inner part of the tooth (pulp). Periodontal abscess. This abscess may form from the gum. What are the causes? This condition is caused by a bacterial infection in or around the tooth. It may result from: Severe tooth decay (cavities). Trauma to the tooth, such as a broken or chipped tooth. What increases the risk? This condition is more likely to develop in males. It is also more likely to develop in people who: Have cavities. Have severe gum disease. Eat sugary snacks between meals. Use tobacco products. Have diabetes. Have a weakened disease-fighting system (immune system). Do not brush and care for their teeth regularly. What are the signs or symptoms? Mild symptoms of this condition include: Tenderness. Bad breath. Fever. A bitter taste in the mouth. Pain in and around the infected tooth. Moderate symptoms of this condition include: Swollen neck glands. Chills. Pus drainage. Swelling and redness around the infected tooth, in the mouth, or in the face. Severe pain in and around the infected tooth. Severe symptoms of this condition include: Difficulty swallowing. Difficulty opening the mouth. Nausea. Vomiting. How is this diagnosed? This condition is  diagnosed based on: Your symptoms and your medical and dental history. An examination of the infected tooth. During the exam, your dental care provider may tap on the infected tooth. You may also need to have X-rays taken of the affected area. How is this treated? This condition is treated by getting rid of the infection. This may be done with: Antibiotic medicines. These may be used in certain situations. Antibacterial mouth rinse. Incision and drainage. This procedure is done by making an incision in the abscess to drain out the pus. Removing pus is the first priority in treating an abscess. A root canal. This may be performed to save the tooth. Your dental care provider accesses the visible part of your tooth (crown) with a drill and removes any infected pulp. Then the space is filled and sealed off. Tooth extraction. The tooth is pulled out if it cannot be saved by other treatment. You may also receive treatment for pain, such as: Acetaminophen or NSAIDs. Gels that contain a numbing medicine. An injection to block the pain near your nerve. Follow these instructions at home: Medicines Take over-the-counter and prescription medicines only as told by your dental care provider. If you were prescribed an antibiotic, take it as told by your dental care provider. Do not stop taking the antibiotic even if you start to feel better. If you were prescribed a gel that contains a numbing medicine, use it exactly as told in the directions. Do not use these gels for children who are younger than 75 years of age. Use an antibacterial mouth rinse as told by your dental care provider. General instructions  Gargle with a mixture of salt  and water 3-4 times a day or as needed. To make salt water, completely dissolve -1 tsp (3-6 g) of salt in 1 cup (237 mL) of warm water. Eat a soft diet while your abscess is healing. Drink enough fluid to keep your urine pale yellow. Do not apply heat to the outside of  your mouth. Do not use any products that contain nicotine or tobacco. These products include cigarettes, chewing tobacco, and vaping devices, such as e-cigarettes. If you need help quitting, ask your dental care provider. Keep all follow-up visits. This is important. How is this prevented?  Excellent dental home care, which includes brushing your teeth every morning and night with fluoride toothpaste. Floss one time each day. Get regularly scheduled dental cleanings. Consider having a dental sealant applied on teeth that have deep grooves to prevent cavities. Drink fluoridated water regularly. This includes most tap water. Check the label on bottled water to see if it contains fluoride. Reduce or eliminate sugary drinks. Eat healthy meals and snacks. Wear a mouth guard or face shield to protect your teeth while playing sports. Contact a health care provider if: Your pain is worse and is not helped by medicine. You have swelling. You see pus around the tooth. You have a fever or chills. Get help right away if: Your symptoms suddenly get worse. You have a very bad headache. You have problems breathing or swallowing. You have trouble opening your mouth. You have swelling in your neck or around your eye. These symptoms may represent a serious problem that is an emergency. Do not wait to see if the symptoms will go away. Get medical help right away. Call your local emergency services (911 in the [place] Do not drive yourself to the hospital. Summary A dental abscess is a collection of pus in or around a tooth that results from an infection. A dental abscess may result from severe tooth decay, trauma to the tooth, or severe gum disease around a tooth. Symptoms include severe pain, swelling, redness, and drainage of pus in and around the infected tooth. The first priority in treating a dental abscess is to drain out the pus. Treatment may also involve removing damage inside the tooth (root canal)  or extracting the tooth. This information is not intended to replace advice given to you by your health care provider. Make sure you discuss any questions you have with your health care provider. Document Revised: 08/06/2020 Document Reviewed: 08/06/2020 Elsevier Patient Education  2023 [employer].

## 2022-10-31 NOTE — Progress Notes (Unsigned)
Established Patient Office Visit  Subjective   Patient ID: Jon Clayton, male    DOB: 24-Sep-1995  Age: 27 y.o. MRN: [id number]  Chief Complaint  Patient presents with   Dental Pain    Left side,     States that he continues to be treated for substance abuse at [facility] residential treatment center.  States that he is unsure of his aftercare plans, is currently going to have a 30-day extension.  States that he continues to have dental pain, states that the lower left and then back swells up every night, states that it is very painful to touch even with his tongue.  States that his last regimen of Levaquin and Flagyl did offer some relief but pain returned shortly after completing regimen.  States that he is using ibuprofen 400 mg and alternating with Tylenol with mild relief.    Past Medical History:  Diagnosis Date   Depression    Psychosis (HCC)    PTSD (post-traumatic stress disorder)    Social History   Socioeconomic History   Marital status: Single    Spouse name: Not on file   Number of children: Not on file   Years of education: Not on file   Highest education level: 8th grade  Occupational History   Not on file  Tobacco Use   Smoking status: Every Day    Packs/day: 1.00    Years: 11.00    Additional pack years: 0.00    Total pack years: 11.00    Types: Cigarettes   Smokeless tobacco: Never  Vaping Use   Vaping Use: Never used  Substance and Sexual Activity   Alcohol use: Not Currently    Comment: reports periodic alcohol use   Drug use: Yes    Frequency: 1.0 times per week    Types: Marijuana    Comment: 1/3 gm daily   Sexual activity: Not Currently  Other Topics Concern   Not on file  Social History Narrative   Not on file   Social Determinants of Health   Financial Resource Strain: Not on file  Food Insecurity: Food Insecurity Present (09/19/2022)   Hunger Vital Sign    Worried About Programme researcher, broadcasting/film/video in the Last Year: Often true    Ran Out of  Food in the Last Year: Often true  Transportation Needs: Unmet Transportation Needs (09/19/2022)   PRAPARE - Administrator, Civil Service (Medical): Yes    Lack of Transportation (Non-Medical): Yes  Physical Activity: Not on file  Stress: Not on file  Social Connections: Not on file  Intimate Partner Violence: Not At Risk (09/19/2022)   Humiliation, Afraid, Rape, and Kick questionnaire    Fear of Current or Ex-Partner: No    Emotionally Abused: No    Physically Abused: No    Sexually Abused: No   History reviewed. No pertinent family history. Allergies  Allergen Reactions   Amoxicillin Anaphylaxis and Rash   Penicillins Anaphylaxis and Rash   Other Swelling and Other (See Comments)    Pt said he is allergic to tapioca. Causes big knots to appear on his tongue.   Pertussis Vaccines Hives and Other (See Comments)    Per MD, said he was allergic to a medication in the vaccine   Tape Other (See Comments)    Bandages, once removed, leave the skin swollen/puffy/irritated where they were placed   Blue Mussel [Mytilus] Itching, Swelling, Rash and Other (See Comments)    Itchy throat  Review of Systems  Constitutional:  Negative for chills and fever.  HENT: Negative.    Eyes: Negative.   Respiratory:  Negative for shortness of breath.   Cardiovascular:  Negative for chest pain.  Gastrointestinal: Negative.   Genitourinary: Negative.   Musculoskeletal: Negative.   Skin: Negative.   Neurological: Negative.   Endo/Heme/Allergies: Negative.   Psychiatric/Behavioral: Negative.        Objective:     BP 128/79 (BP Location: Left Arm, Patient Position: Sitting, Cuff Size: Large)   Pulse 85   Ht 5\' 7"  (1.702 m)   Wt 226 lb (102.5 kg)   SpO2 98%   BMI 35.40 kg/m  BP Readings from Last 3 Encounters:  10/31/22 128/79  09/17/22 123/74  08/20/22 120/78   Wt Readings from Last 3 Encounters:  10/31/22 226 lb (102.5 kg)  09/16/22 194 lb (88 kg)  08/18/22 186 lb (84.4  kg)      Physical Exam Vitals and nursing note reviewed.  HENT:     Head: Normocephalic and atraumatic.     Jaw: Swelling present.     Right Ear: External ear normal.     Left Ear: External ear normal.     Nose: Nose normal.     Mouth/Throat:     Lips: Pink.     Mouth: Mucous membranes are moist.     Dentition: Abnormal dentition. Dental tenderness and dental abscesses present.     Pharynx: Oropharynx is clear.     Comments: Slight swelling noted lower left jaw Eyes:     Extraocular Movements: Extraocular movements intact.     Conjunctiva/sclera: Conjunctivae normal.     Pupils: Pupils are equal, round, and reactive to light.  Cardiovascular:     Rate and Rhythm: Normal rate and regular rhythm.     Pulses: Normal pulses.     Heart sounds: Normal heart sounds.  Pulmonary:     Effort: Pulmonary effort is normal.     Breath sounds: Normal breath sounds.  Musculoskeletal:        General: Normal range of motion.     Cervical back: Normal range of motion and neck supple.  Skin:    General: Skin is warm and dry.  Neurological:     General: No focal deficit present.     Mental Status: He is alert and oriented to person, place, and time.  Psychiatric:        Mood and Affect: Mood normal.        Behavior: Behavior normal.        Judgment: Judgment normal.        Assessment & Plan:   Problem List Items Addressed This Visit       Other   PTSD (post-traumatic stress disorder) (Chronic)   Marijuana abuse   Methamphetamine abuse (HCC)   Tobacco abuse   Vitamin D deficiency   Schizoaffective disorder, bipolar type (HCC)   Alcohol abuse   Other Visit Diagnoses     Pain, dental    -  Primary   Relevant Medications   levofloxacin (LEVAQUIN) 750 MG tablet   metroNIDAZOLE (FLAGYL) 500 MG tablet   ibuprofen (ADVIL) 800 MG tablet   Other Relevant Orders   Ambulatory referral to Dentistry     1. Pain, dental Repeat trial Levaquin, Flagyl.  Patient education given on  supportive care, warning signs for C. difficile.  Patient agreeable.  Patient referred to dentistry for urgent visit.  Trial ibuprofen 800 mg.  Red flags given  for prompt reevaluation - Ambulatory referral to Dentistry - levofloxacin (LEVAQUIN) 750 MG tablet; Take 1 tablet (750 mg total) by mouth daily for 7 days.  Dispense: 7 tablet; Refill: 0 - metroNIDAZOLE (FLAGYL) 500 MG tablet; Take 1 tablet (500 mg total) by mouth 3 (three) times daily for 7 days.  Dispense: 21 tablet; Refill: 0 - ibuprofen (ADVIL) 800 MG tablet; Take 1 tablet (800 mg total) by mouth every 8 (eight) hours as needed.  Dispense: 60 tablet; Refill: 0  2. Vitamin D deficiency Continue current regimen, no refills needed today  3. Schizoaffective disorder, bipolar type [facility]) Follow-up with in-house psychiatry  4. PTSD (post-traumatic stress disorder)   5. Alcohol abuse Currently in substance abuse treatment program  6. Methamphetamine abuse (HCC)   7. Marijuana abuse   8. Tobacco abuse    I have reviewed the patient's medical history (PMH, PSH, Social History, Family History, Medications, and allergies) , and have been updated if relevant. I spent 30 minutes reviewing chart and  face to face time with patient.     Return if symptoms worsen or fail to improve.    Kasandra Knudsen Mayers, PA-C

## 2022-11-14 ENCOUNTER — Encounter: Payer: Self-pay | Admitting: Physician Assistant

## 2022-11-14 ENCOUNTER — Ambulatory Visit: Payer: Medicaid Other | Admitting: Physician Assistant

## 2022-11-14 VITALS — BP 127/83 | HR 101 | Ht 67.0 in | Wt 226.0 lb

## 2022-11-14 DIAGNOSIS — K2100 Gastro-esophageal reflux disease with esophagitis, without bleeding: Secondary | ICD-10-CM | POA: Diagnosis not present

## 2022-11-14 DIAGNOSIS — F17210 Nicotine dependence, cigarettes, uncomplicated: Secondary | ICD-10-CM | POA: Diagnosis not present

## 2022-11-14 DIAGNOSIS — F1210 Cannabis abuse, uncomplicated: Secondary | ICD-10-CM

## 2022-11-14 DIAGNOSIS — F1510 Other stimulant abuse, uncomplicated: Secondary | ICD-10-CM | POA: Diagnosis not present

## 2022-11-14 DIAGNOSIS — Z720 Tobacco use: Secondary | ICD-10-CM

## 2022-11-14 DIAGNOSIS — F1010 Alcohol abuse, uncomplicated: Secondary | ICD-10-CM

## 2022-11-14 MED ORDER — PANTOPRAZOLE SODIUM 40 MG PO TBEC
40.0000 mg | DELAYED_RELEASE_TABLET | Freq: Every day | ORAL | 3 refills | Status: DC
Start: 2022-11-14 — End: 2023-01-16

## 2022-11-14 NOTE — Patient Instructions (Signed)
To help with your GERD symptoms, you are going to take Protonix once daily.  Please let us know if there is anything else we can do for you  Roney Jaffe, PA-C Physician Assistant [facility] [email or web address]   Gastroesophageal Reflux Disease, Adult Gastroesophageal reflux (GER) happens when acid from the stomach flows up into the tube that connects the mouth and the stomach (esophagus). Normally, food travels down the esophagus and stays in the stomach to be digested. However, when a person has GER, food and stomach acid sometimes move back up into the esophagus. If this becomes a more serious problem, the person may be diagnosed with a disease called gastroesophageal reflux disease (GERD). GERD occurs when the reflux: Happens often. Causes frequent or severe symptoms. Causes problems such as damage to the esophagus. When stomach acid comes in contact with the esophagus, the acid may cause inflammation in the esophagus. Over time, GERD may create small holes (ulcers) in the lining of the esophagus. What are the causes? This condition is caused by a problem with the muscle between the esophagus and the stomach (lower esophageal sphincter, or LES). Normally, the LES muscle closes after food passes through the esophagus to the stomach. When the LES is weakened or abnormal, it does not close properly, and that allows food and stomach acid to go back up into the esophagus. The LES can be weakened by certain dietary substances, medicines, and medical conditions, including: Tobacco use. Pregnancy. Having a hiatal hernia. Alcohol use. Certain foods and beverages, such as coffee, chocolate, onions, and peppermint. What increases the risk? You are more likely to develop this condition if you: Have an increased body weight. Have a connective tissue disorder. Take NSAIDs, such as ibuprofen. What are the signs or symptoms? Symptoms of this  condition include: Heartburn. Difficult or painful swallowing and the feeling of having a lump in the throat. A bitter taste in the mouth. Bad breath and having a large amount of saliva. Having an upset or bloated stomach and belching. Chest pain. Different conditions can cause chest pain. Make sure you see your health care provider if you experience chest pain. Shortness of breath or wheezing. Ongoing (chronic) cough or a nighttime cough. Wearing away of tooth enamel. Weight loss. How is this diagnosed? This condition may be diagnosed based on a medical history and a physical exam. To determine if you have mild or severe GERD, your health care provider may also monitor how you respond to treatment. You may also have tests, including: A test to examine your stomach and esophagus with a small camera (endoscopy). A test that measures the acidity level in your esophagus. A test that measures how much pressure is on your esophagus. A barium swallow or modified barium swallow test to show the shape, size, and functioning of your esophagus. How is this treated? Treatment for this condition may vary depending on how severe your symptoms are. Your health care provider may recommend: Changes to your diet. Medicine. Surgery. The goal of treatment is to help relieve your symptoms and to prevent complications. Follow these instructions at home: Eating and drinking  Follow a diet as recommended by your health care provider. This may involve avoiding foods and drinks such as: Coffee and tea, with or without caffeine. Drinks that contain alcohol. Energy drinks and sports drinks. Carbonated drinks or sodas. Chocolate and cocoa. Peppermint and mint flavorings. Garlic and onions. Horseradish. Spicy and acidic foods, including peppers, chili powder, curry powder, vinegar, hot  sauces, and barbecue sauce. Citrus fruit juices and citrus fruits, such as oranges, lemons, and limes. Tomato-based foods,  such as red sauce, chili, salsa, and pizza with red sauce. Fried and fatty foods, such as donuts, french fries, potato chips, and high-fat dressings. High-fat meats, such as hot dogs and fatty cuts of red and white meats, such as rib eye steak, sausage, ham, and bacon. High-fat dairy items, such as whole milk, butter, and cream cheese. Eat small, frequent meals instead of large meals. Avoid drinking large amounts of liquid with your meals. Avoid eating meals during the 2-3 hours before bedtime. Avoid lying down right after you eat. Do not exercise right after you eat. Lifestyle  Do not use any products that contain nicotine or tobacco. These products include cigarettes, chewing tobacco, and vaping devices, such as e-cigarettes. If you need help quitting, ask your health care provider. Try to reduce your stress by using methods such as yoga or meditation. If you need help reducing stress, ask your health care provider. If you are overweight, reduce your weight to an amount that is healthy for you. Ask your health care provider for guidance about a safe weight loss goal. General instructions Pay attention to any changes in your symptoms. Take over-the-counter and prescription medicines only as told by your health care provider. Do not take aspirin, ibuprofen, or other NSAIDs unless your health care provider told you to take these medicines. Wear loose-fitting clothing. Do not wear anything tight around your waist that causes pressure on your abdomen. Raise (elevate) the head of your bed about 6 inches (15 cm). You can use a wedge to do this. Avoid bending over if this makes your symptoms worse. Keep all follow-up visits. This is important. Contact a health care provider if: You have: New symptoms. Unexplained weight loss. Difficulty swallowing or it hurts to swallow. Wheezing or a persistent cough. A hoarse voice. Your symptoms do not improve with treatment. Get help right away if: You  have sudden pain in your arms, neck, jaw, teeth, or back. You suddenly feel sweaty, dizzy, or light-headed. You have chest pain or shortness of breath. You vomit and the vomit is green, yellow, or black, or it looks like blood or coffee grounds. You faint. You have stool that is red, bloody, or black. You cannot swallow, drink, or eat. These symptoms may represent a serious problem that is an emergency. Do not wait to see if the symptoms will go away. Get medical help right away. Call your local emergency services (911 in the [place] Do not drive yourself to the hospital. Summary Gastroesophageal reflux happens when acid from the stomach flows up into the esophagus. GERD is a disease in which the reflux happens often, causes frequent or severe symptoms, or causes problems such as damage to the esophagus. Treatment for this condition may vary depending on how severe your symptoms are. Your health care provider may recommend diet and lifestyle changes, medicine, or surgery. Contact a health care provider if you have new or worsening symptoms. Take over-the-counter and prescription medicines only as told by your health care provider. Do not take aspirin, ibuprofen, or other NSAIDs unless your health care provider told you to do so. Keep all follow-up visits as told by your health care provider. This is important. This information is not intended to replace advice given to you by your health care provider. Make sure you discuss any questions you have with your health care provider. Document Revised: 12/09/2019 Document Reviewed:  12/09/2019 Elsevier Patient Education  2024 [employer].

## 2022-11-14 NOTE — Progress Notes (Signed)
Established Patient Office Visit  Subjective   Patient ID: Jon Clayton, male    DOB: 02/04/1996  Age: 27 y.o. MRN: [id number]  Chief Complaint  Patient presents with   Gastroesophageal Reflux    States that he continues to be treated for substance abuse at [facility] residential treatment center.  States that he continues to be unsure of his aftercare plan yet.  States over the past 2 weeks he has been having episodes of acid reflux, some discomfort in his midsternal area while eating as well as a feeling of pills getting stuck in the midsternal area.  States that he has not had any episodes of choking.  States that he has been drinking Ensure when he has discomfort in the midsternal area with some relief.  States that he has been using the ibuprofen 800 mg twice a day due to his dental pain.    Past Medical History:  Diagnosis Date   Depression    Psychosis (HCC)    PTSD (post-traumatic stress disorder)    Social History   Socioeconomic History   Marital status: Single    Spouse name: Not on file   Number of children: Not on file   Years of education: Not on file   Highest education level: 8th grade  Occupational History   Not on file  Tobacco Use   Smoking status: Every Day    Packs/day: 1.00    Years: 11.00    Additional pack years: 0.00    Total pack years: 11.00    Types: Cigarettes   Smokeless tobacco: Never  Vaping Use   Vaping Use: Never used  Substance and Sexual Activity   Alcohol use: Not Currently    Comment: reports periodic alcohol use   Drug use: Yes    Frequency: 1.0 times per week    Types: Marijuana    Comment: 1/3 gm daily   Sexual activity: Not Currently  Other Topics Concern   Not on file  Social History Narrative   Not on file   Social Determinants of Health   Financial Resource Strain: Not on file  Food Insecurity: Food Insecurity Present (09/19/2022)   Hunger Vital Sign    Worried About Programme researcher, broadcasting/film/video in the Last Year: Often  true    Ran Out of Food in the Last Year: Often true  Transportation Needs: Unmet Transportation Needs (09/19/2022)   PRAPARE - Administrator, Civil Service (Medical): Yes    Lack of Transportation (Non-Medical): Yes  Physical Activity: Not on file  Stress: Not on file  Social Connections: Not on file  Intimate Partner Violence: Not At Risk (09/19/2022)   Humiliation, Afraid, Rape, and Kick questionnaire    Fear of Current or Ex-Partner: No    Emotionally Abused: No    Physically Abused: No    Sexually Abused: No   History reviewed. No pertinent family history. Allergies  Allergen Reactions   Amoxicillin Anaphylaxis and Rash   Penicillins Anaphylaxis and Rash   Other Swelling and Other (See Comments)    Pt said he is allergic to tapioca. Causes big knots to appear on his tongue.   Pertussis Vaccines Hives and Other (See Comments)    Per MD, said he was allergic to a medication in the vaccine   Tape Other (See Comments)    Bandages, once removed, leave the skin swollen/puffy/irritated where they were placed   Blue Mussel [Mytilus] Itching, Swelling, Rash and Other (See Comments)  Itchy throat    Review of Systems  Constitutional: Negative.   HENT: Negative.    Eyes: Negative.   Respiratory:  Negative for shortness of breath.   Cardiovascular:  Negative for chest pain.  Gastrointestinal:  Negative for abdominal pain, heartburn, nausea and vomiting.  Genitourinary: Negative.   Musculoskeletal: Negative.   Skin: Negative.   Neurological: Negative.   Endo/Heme/Allergies: Negative.   Psychiatric/Behavioral: Negative.        Objective:     BP 127/83 (BP Location: Left Arm, Patient Position: Sitting, Cuff Size: Large)   Pulse (!) 101   Ht 5\' 7"  (1.702 m)   Wt 226 lb (102.5 kg)   SpO2 97%   BMI 35.40 kg/m    Physical Exam Vitals and nursing note reviewed.  Constitutional:      Appearance: Normal appearance.  HENT:     Head: Normocephalic and  atraumatic.     Right Ear: External ear normal.     Left Ear: External ear normal.     Nose: Nose normal.     Mouth/Throat:     Mouth: Mucous membranes are moist.     Pharynx: Oropharynx is clear.  Eyes:     Extraocular Movements: Extraocular movements intact.     Conjunctiva/sclera: Conjunctivae normal.     Pupils: Pupils are equal, round, and reactive to light.  Cardiovascular:     Rate and Rhythm: Normal rate and regular rhythm.     Pulses: Normal pulses.     Heart sounds: Normal heart sounds.  Pulmonary:     Effort: Pulmonary effort is normal.     Breath sounds: Normal breath sounds.  Abdominal:     General: Bowel sounds are normal.     Tenderness: There is no abdominal tenderness.  Musculoskeletal:        General: Normal range of motion.     Cervical back: Normal range of motion and neck supple.  Skin:    General: Skin is warm and dry.  Neurological:     General: No focal deficit present.     Mental Status: He is alert and oriented to person, place, and time.  Psychiatric:        Mood and Affect: Mood normal.        Thought Content: Thought content normal.        Judgment: Judgment normal.        Assessment & Plan:   Problem List Items Addressed This Visit       Other   Marijuana abuse   Methamphetamine abuse (HCC)   Tobacco abuse   Alcohol abuse   Other Visit Diagnoses     Gastroesophageal reflux disease with esophagitis without hemorrhage    -  Primary   Relevant Medications   pantoprazole (PROTONIX) 40 MG tablet      1. Gastroesophageal reflux disease with esophagitis without hemorrhage Trial of Protonix.  Patient education given on lifestyle modifications, including mindful use of ibuprofen and other NSAIDs.  Patient strongly encouraged to follow-up with dentistry as soon as able to help avoid continued use of ibuprofen and other NSAIDs.  Red flags given for prompt reevaluation  Patient to follow-up with mobile unit in 2 weeks. - pantoprazole  (PROTONIX) 40 MG tablet; Take 1 tablet (40 mg total) by mouth daily.  Dispense: 30 tablet; Refill: 3  2. Alcohol abuse Currently in substance abuse treatment program  3. Methamphetamine abuse (HCC)   4. Marijuana abuse   5. Tobacco abuse    I  have reviewed the patient's medical history (PMH, PSH, Social History, Family History, Medications, and allergies) , and have been updated if relevant. I spent 30 minutes reviewing chart and  face to face time with patient.    Return in about 2 weeks (around 11/28/2022) for with [facility].    Kasandra Knudsen Mayers, PA-C

## 2023-01-14 IMAGING — US US ABDOMEN LIMITED
1 series · 14 of 25 positions shown · non-contrast
Comparison: None.

CLINICAL DATA: Pain right upper quadrant

EXAM:
ULTRASOUND ABDOMEN LIMITED RIGHT UPPER QUADRANT

[Series 1: us abdomen limited ruq (liver/gb) · 14 of 54 slices shown]
[im 1/54]
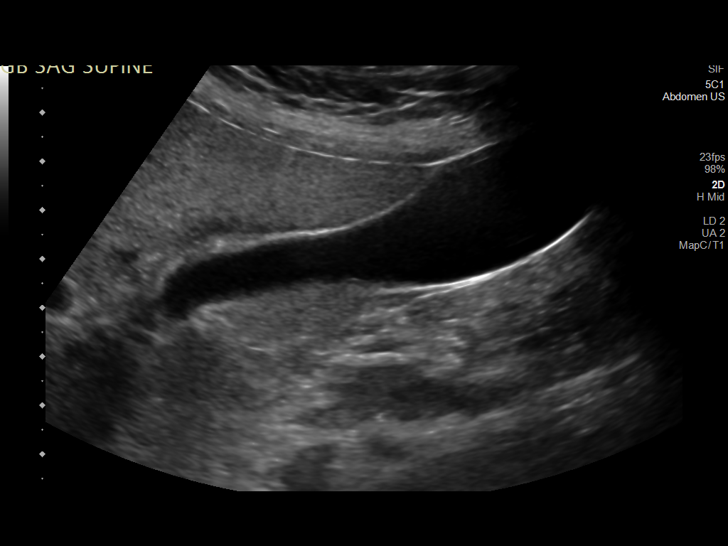
[im 5/54]
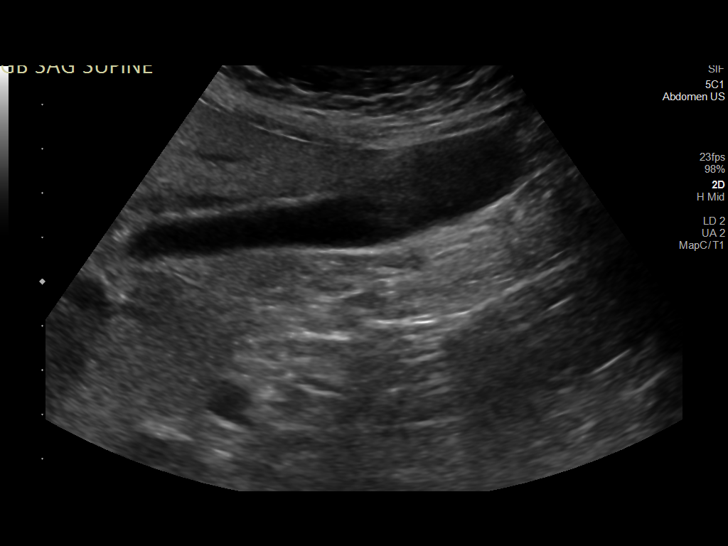
[im 9/54]
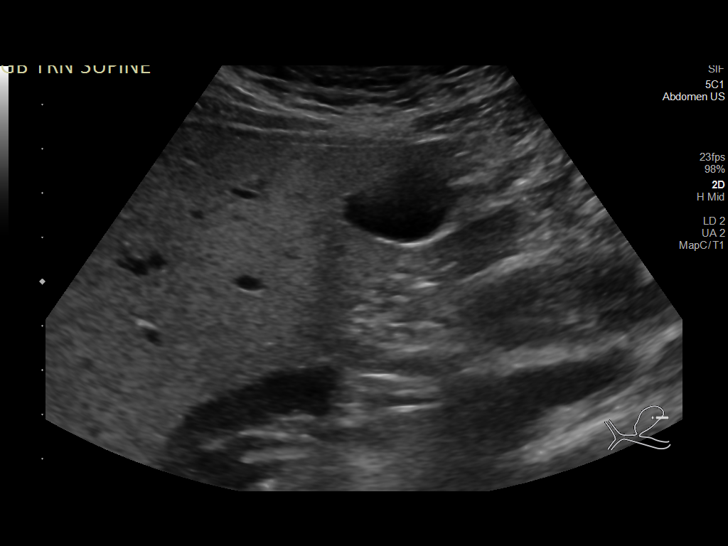
[im 14/54]
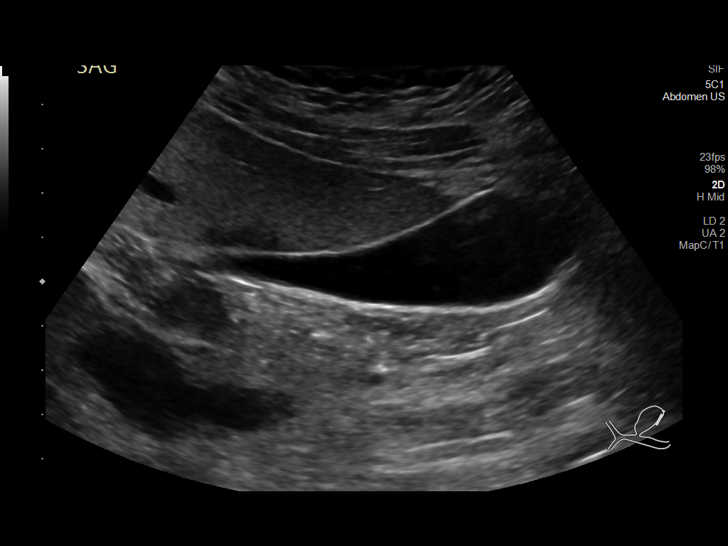
[im 18/54]
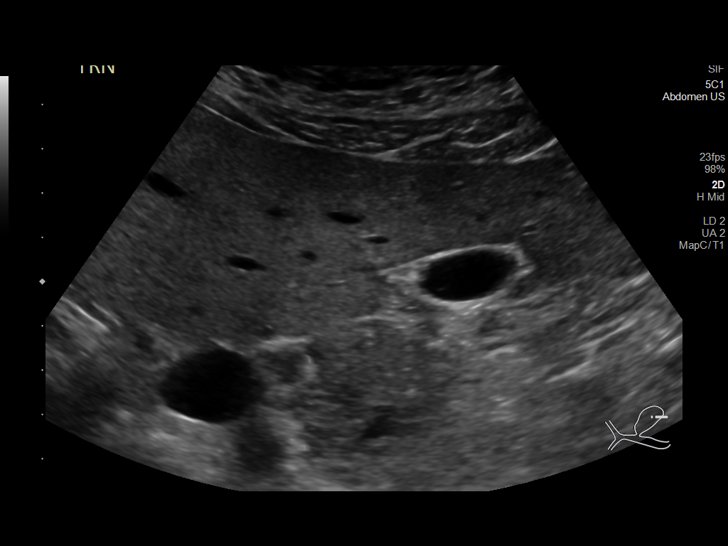
[im 20/54]
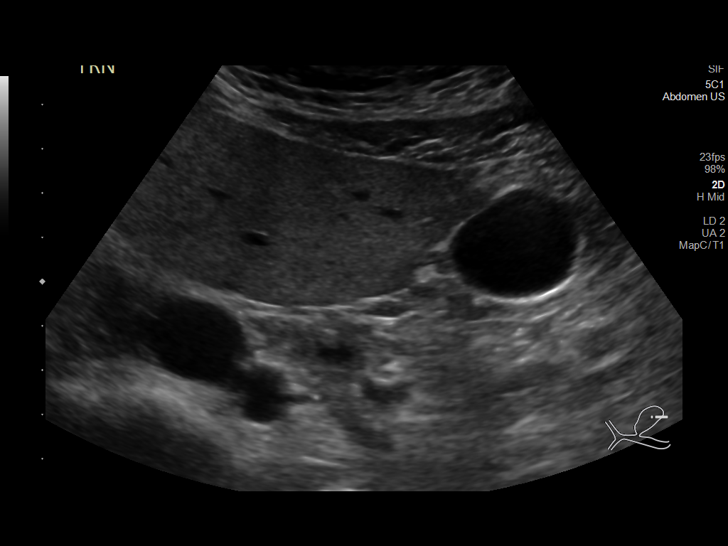
[im 25/54]
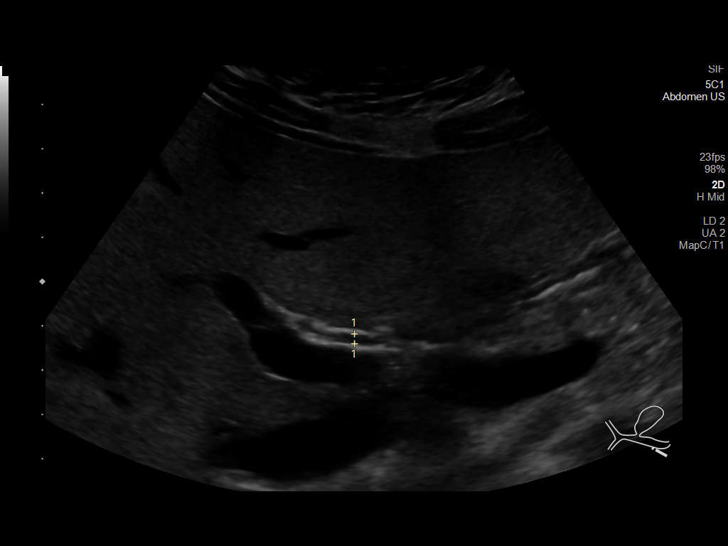
[im 29/54]
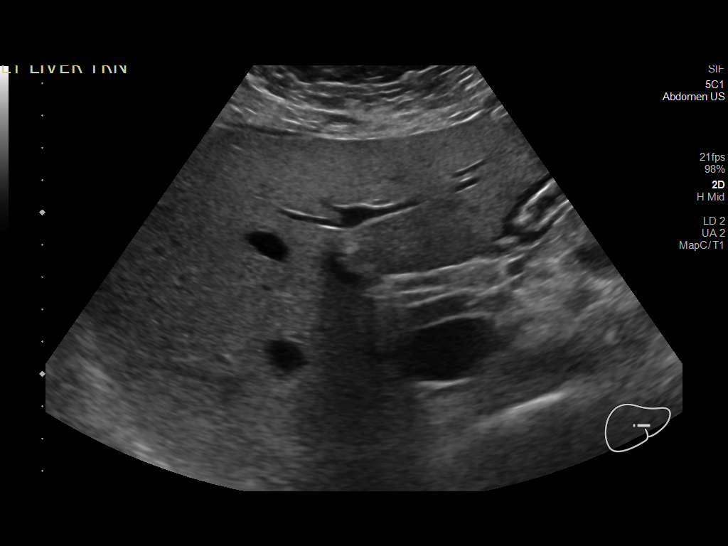
[im 34/54]
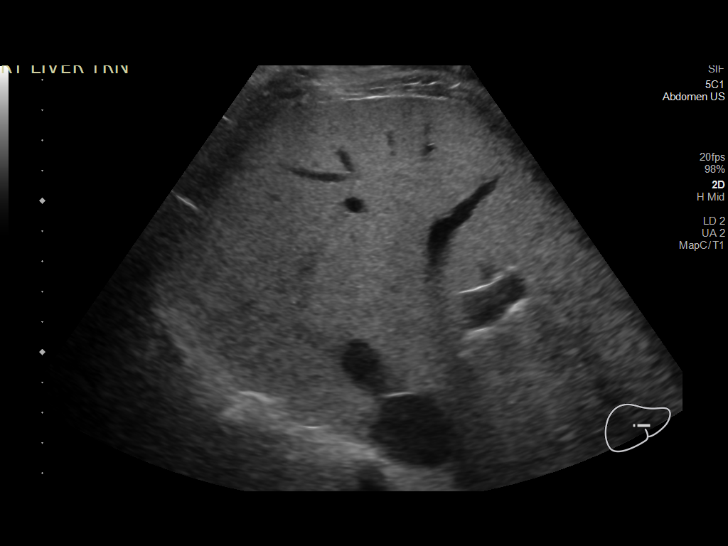
[im 36/54]
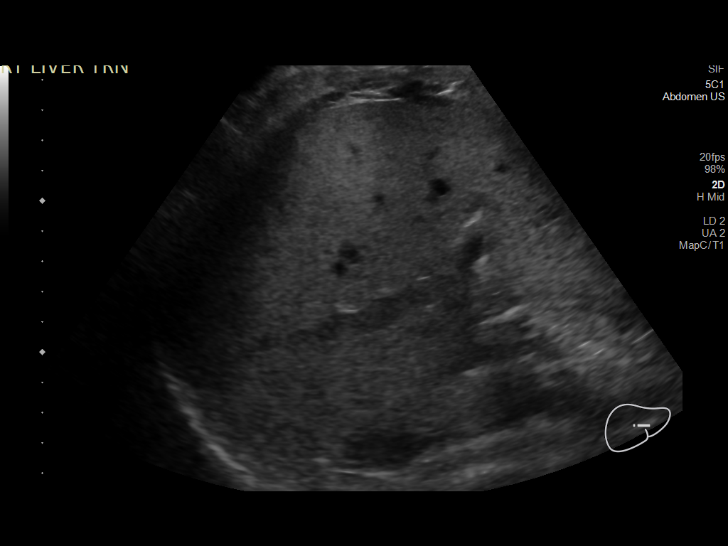
[im 40/54]
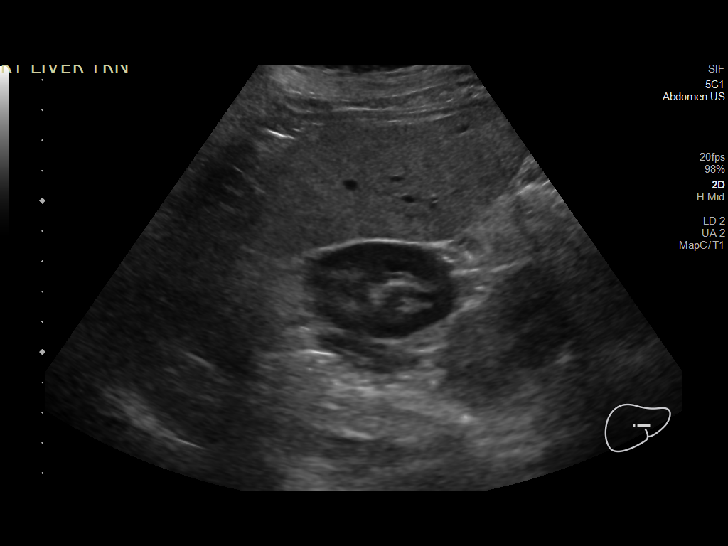
[im 45/54]
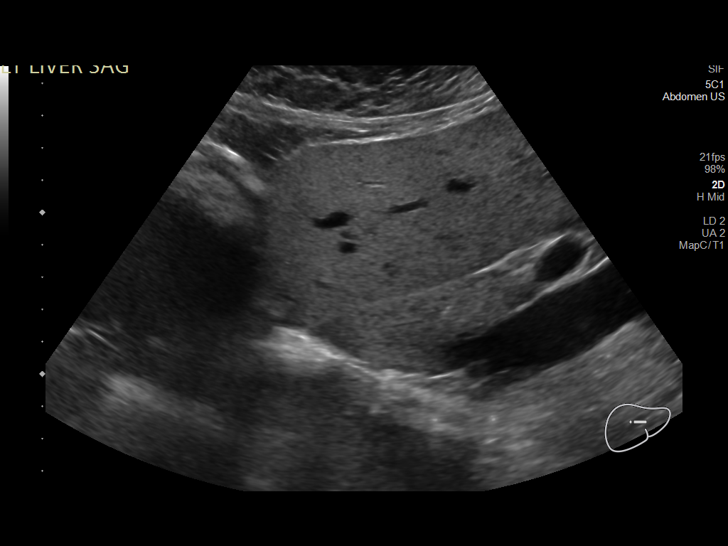
[im 49/54]
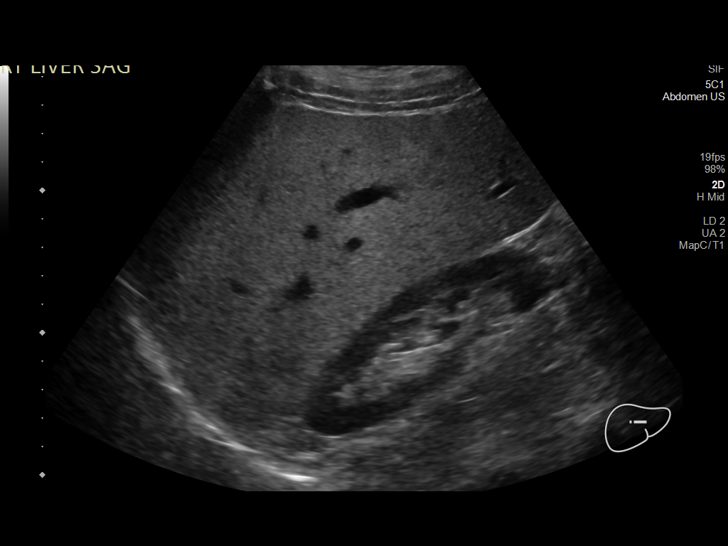
[im 54/54]
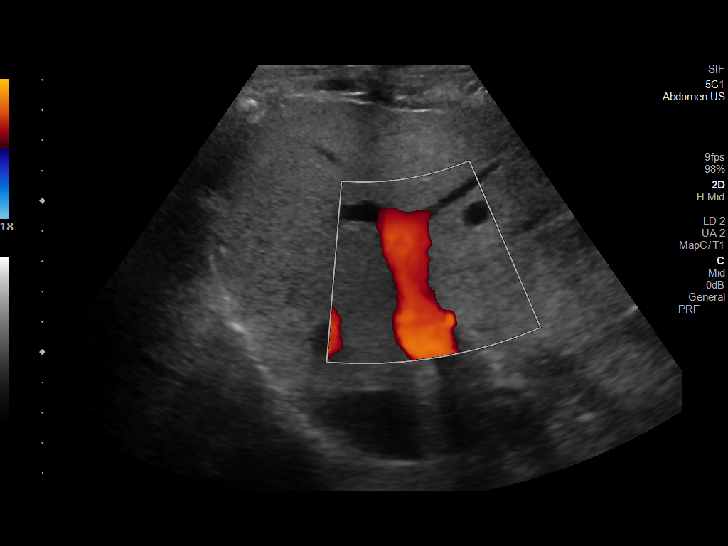

[14 of 25 positions shown; findings below may reference images not displayed]

FINDINGS: Gallbladder:

No gallstones or wall thickening visualized. No sonographic Murphy
sign noted by sonographer.

Common bile duct:

Diameter: 3 mm

Liver:

There is increased echogenicity in the liver suggesting fatty
infiltration. There is ill-defined area of sparing from fatty
infiltration in the liver close to the gallbladder fossa. No focal
space-occupying lesions are noted in the visualized portions of
liver. Portal vein is patent on color Doppler imaging with normal
direction of blood flow towards the liver.

Other: None.
IMPRESSION: Fatty liver. No other sonographic abnormality is seen in the right
upper quadrant.

## 2023-01-16 ENCOUNTER — Encounter: Payer: Self-pay | Admitting: Physician Assistant

## 2023-01-16 ENCOUNTER — Ambulatory Visit: Payer: MEDICAID | Admitting: Physician Assistant

## 2023-01-16 VITALS — BP 124/85 | HR 92 | Ht 67.0 in | Wt 229.0 lb

## 2023-01-16 DIAGNOSIS — F17210 Nicotine dependence, cigarettes, uncomplicated: Secondary | ICD-10-CM

## 2023-01-16 DIAGNOSIS — F1210 Cannabis abuse, uncomplicated: Secondary | ICD-10-CM

## 2023-01-16 DIAGNOSIS — F1010 Alcohol abuse, uncomplicated: Secondary | ICD-10-CM

## 2023-01-16 DIAGNOSIS — R110 Nausea: Secondary | ICD-10-CM | POA: Diagnosis not present

## 2023-01-16 DIAGNOSIS — F1990 Other psychoactive substance use, unspecified, uncomplicated: Secondary | ICD-10-CM | POA: Diagnosis not present

## 2023-01-16 DIAGNOSIS — K2100 Gastro-esophageal reflux disease with esophagitis, without bleeding: Secondary | ICD-10-CM | POA: Diagnosis not present

## 2023-01-16 DIAGNOSIS — F1510 Other stimulant abuse, uncomplicated: Secondary | ICD-10-CM

## 2023-01-16 MED ORDER — PANTOPRAZOLE SODIUM 40 MG PO TBEC: 40.0000 mg | DELAYED_RELEASE_TABLET | Freq: Two times a day (BID) | ORAL | 1 refills | Status: DC

## 2023-01-16 NOTE — Patient Instructions (Signed)
You are going to increase Protonix to twice daily.  I encourage you to be very intentional about your use of ibuprofen.  [facility] 9 AM to have a lab completed.  We will call you with the lab results.  You will follow-up with the mobile unit in 2 weeks.  Roney Jaffe, PA-C Physician Assistant [facility] [email or web address]   Food Choices for Gastroesophageal Reflux Disease, Adult When you have gastroesophageal reflux disease (GERD), the foods you eat and your eating habits are very important. Choosing the right foods can help ease the discomfort of GERD. Consider working with a dietitian to help you make healthy food choices. What are tips for following this plan? Reading food labels Look for foods that are low in saturated fat. Foods that have less than 5% of daily value (DV) of fat and 0 g of trans fats may help with your symptoms. Cooking Cook foods using methods other than frying. This may include baking, steaming, grilling, or broiling. These are all methods that do not need a lot of fat for cooking. To add flavor, try to use herbs that are low in spice and acidity. Meal planning  Choose healthy foods that are low in fat, such as fruits, vegetables, whole grains, low-fat dairy products, lean meats, fish, and poultry. Eat frequent, small meals instead of three large meals each day. Eat your meals slowly, in a relaxed setting. Avoid bending over or lying down until 2-3 hours after eating. Limit high-fat foods such as fatty meats or fried foods. Limit your intake of fatty foods, such as oils, butter, and shortening. Avoid the following as told by your health care provider: Foods that cause symptoms. These may be different for different people. Keep a food diary to keep track of foods that cause symptoms. Alcohol. Drinking large amounts of liquid with  meals. Eating meals during the 2-3 hours before bed. Lifestyle Maintain a healthy weight. Ask your health care provider what weight is healthy for you. If you need to lose weight, work with your health care provider to do so safely. Exercise for at least 30 minutes on 5 or more days each week, or as told by your health care provider. Avoid wearing clothes that fit tightly around your waist and chest. Do not use any products that contain nicotine or tobacco. These products include cigarettes, chewing tobacco, and vaping devices, such as e-cigarettes. If you need help quitting, ask your health care provider. Sleep with the head of your bed raised. Use a wedge under the mattress or blocks under the bed frame to raise the head of the bed. Chew sugar-free gum after mealtimes. What foods should I eat?  Eat a healthy, well-balanced diet of fruits, vegetables, whole grains, low-fat dairy products, lean meats, fish, and poultry. Each person is different. Foods that may trigger symptoms in one person may not trigger any symptoms in another person. Work with your health care provider to identify foods that are safe for you. The items listed above may not be a complete list of recommended foods and beverages. Contact a dietitian for more information. What foods should I avoid? Limiting some of these foods may help manage the symptoms of GERD. Everyone is different. Consult a dietitian or your health care provider to help you identify the exact foods to avoid, if any. Fruits Any fruits prepared with added fat. Any fruits that cause symptoms. For some people this  may include citrus fruits, such as oranges, grapefruit, pineapple, and lemons. Vegetables Deep-fried vegetables. [place] fries. Any vegetables prepared with added fat. Any vegetables that cause symptoms. For some people, this may include tomatoes and tomato products, chili peppers, onions and garlic, and horseradish. Grains Pastries or quick breads  with added fat. Meats and other proteins High-fat meats, such as fatty beef or pork, hot dogs, ribs, ham, sausage, salami, and bacon. Fried meat or protein, including fried fish and fried chicken. Nuts and nut butters, in large amounts. Dairy Whole milk and chocolate milk. Sour cream. Cream. Ice cream. Cream cheese. Milkshakes. Fats and oils Butter. Margarine. Shortening. Ghee. Beverages Coffee and tea, with or without caffeine. Carbonated beverages. Sodas. Energy drinks. Fruit juice made with acidic fruits, such as orange or grapefruit. Tomato juice. Alcoholic drinks. Sweets and desserts Chocolate and cocoa. Donuts. Seasonings and condiments Pepper. Peppermint and spearmint. Added salt. Any condiments, herbs, or seasonings that cause symptoms. For some people, this may include curry, hot sauce, or vinegar-based salad dressings. The items listed above may not be a complete list of foods and beverages to avoid. Contact a dietitian for more information. Questions to ask your health care provider Diet and lifestyle changes are usually the first steps that are taken to manage symptoms of GERD. If diet and lifestyle changes do not improve your symptoms, talk with your health care provider about taking medicines. Where to find more information International Foundation for Gastrointestinal Disorders: aboutgerd.org Summary When you have gastroesophageal reflux disease (GERD), food and lifestyle choices may be very helpful in easing the discomfort of GERD. Eat frequent, small meals instead of three large meals each day. Eat your meals slowly, in a relaxed setting. Avoid bending over or lying down until 2-3 hours after eating. Limit high-fat foods such as fatty meats or fried foods. This information is not intended to replace advice given to you by your health care provider. Make sure you discuss any questions you have with your health care provider. Document Revised: 12/09/2019 Document Reviewed:  12/09/2019 Elsevier Patient Education  2024 [employer].

## 2023-01-16 NOTE — Progress Notes (Signed)
   Established Patient Office Visit  Subjective   Patient ID: Rodgers Stolte, male    DOB: [birth year]  Age: 27 y.o. MRN: [id number]  Chief Complaint  Patient presents with   Nausea    After eating     Things getting caught has improved  Acid relux  last night - woke up to it    [facility] - waiting to see where he  - go to [place]  2 weeks  07/20/22 158 lb  {History (Optional):23778}  ROS    Objective:     Ht 5\' 7"  (1.702 m)   Wt 229 lb (103.9 kg)   BMI 35.87 kg/m  BP Readings from Last 3 Encounters:  11/14/22 127/83  10/31/22 128/79  09/17/22 123/74   Wt Readings from Last 3 Encounters:  01/16/23 229 lb (103.9 kg)  11/14/22 226 lb (102.5 kg)  10/31/22 226 lb (102.5 kg)      Physical Exam   No results found for any visits on 01/16/23.  {Labs (Optional):23779}  The ASCVD Risk score (Arnett DK, et al., 2019) failed to calculate for the following reasons:   The 2019 ASCVD risk score is only valid for ages 59 to 46    Assessment & Plan:   Problem List Items Addressed This Visit   None   No follow-ups on file.    Kasandra Knudsen Mayers, PA-C

## 2023-01-17 ENCOUNTER — Ambulatory Visit: Payer: MEDICAID | Attending: Physician Assistant

## 2023-01-17 DIAGNOSIS — K2100 Gastro-esophageal reflux disease with esophagitis, without bleeding: Secondary | ICD-10-CM

## 2023-01-18 ENCOUNTER — Encounter: Payer: Self-pay | Admitting: Physician Assistant

## 2023-03-20 ENCOUNTER — Encounter: Payer: Self-pay | Admitting: Physician Assistant

## 2023-03-20 ENCOUNTER — Ambulatory Visit: Payer: MEDICAID | Admitting: Physician Assistant

## 2023-03-20 VITALS — BP 128/87 | HR 91 | Ht 67.0 in | Wt 286.0 lb

## 2023-03-20 DIAGNOSIS — F333 Major depressive disorder, recurrent, severe with psychotic symptoms: Secondary | ICD-10-CM

## 2023-03-20 DIAGNOSIS — F1210 Cannabis abuse, uncomplicated: Secondary | ICD-10-CM

## 2023-03-20 DIAGNOSIS — K2100 Gastro-esophageal reflux disease with esophagitis, without bleeding: Secondary | ICD-10-CM

## 2023-03-20 DIAGNOSIS — F32A Depression, unspecified: Secondary | ICD-10-CM

## 2023-03-20 DIAGNOSIS — F1510 Other stimulant abuse, uncomplicated: Secondary | ICD-10-CM

## 2023-03-20 DIAGNOSIS — E559 Vitamin D deficiency, unspecified: Secondary | ICD-10-CM

## 2023-03-20 DIAGNOSIS — F4310 Post-traumatic stress disorder, unspecified: Secondary | ICD-10-CM

## 2023-03-20 MED ORDER — PANTOPRAZOLE SODIUM 40 MG PO TBEC
40.0000 mg | DELAYED_RELEASE_TABLET | Freq: Two times a day (BID) | ORAL | 1 refills | Status: DC
Start: 2023-03-20 — End: 2023-03-21

## 2023-03-20 NOTE — Progress Notes (Unsigned)
Established Patient Office Visit  Subjective   Patient ID: Jon Clayton, male    DOB: 28-Apr-1996  Age: 27 y.o. MRN: [id number]  Chief Complaint  Patient presents with   Medication Refill    States that he continues to be treated for substance abuse at [facility] recovery center.  Reports that he will be transitioning to long-term care soon but is unsure of completion of program date.  States that he is doing well overall, no new concerns today.  Mood is stable, appetite is good, sleep is good.      Past Medical History:  Diagnosis Date   Depression    Psychosis (HCC)    PTSD (post-traumatic stress disorder)    Social History   Socioeconomic History   Marital status: Single    Spouse name: Not on file   Number of children: Not on file   Years of education: Not on file   Highest education level: 8th grade  Occupational History   Not on file  Tobacco Use   Smoking status: Every Day    Current packs/day: 1.00    Average packs/day: 1 pack/day for 11.0 years (11.0 ttl pk-yrs)    Types: Cigarettes   Smokeless tobacco: Never  Vaping Use   Vaping status: Never Used  Substance and Sexual Activity   Alcohol use: Not Currently    Comment: reports periodic alcohol use   Drug use: Yes    Frequency: 1.0 times per week    Types: Marijuana    Comment: 1/3 gm daily   Sexual activity: Not Currently  Other Topics Concern   Not on file  Social History Narrative   Not on file   Social Determinants of Health   Financial Resource Strain: Not on file  Food Insecurity: Food Insecurity Present (09/19/2022)   Hunger Vital Sign    Worried About Programme researcher, broadcasting/film/video in the Last Year: Often true    Ran Out of Food in the Last Year: Often true  Transportation Needs: Unmet Transportation Needs (09/19/2022)   PRAPARE - Administrator, Civil Service (Medical): Yes    Lack of Transportation (Non-Medical): Yes  Physical Activity: Not on file  Stress: Not on file  Social  Connections: Not on file  Intimate Partner Violence: Not At Risk (09/19/2022)   Humiliation, Afraid, Rape, and Kick questionnaire    Fear of Current or Ex-Partner: No    Emotionally Abused: No    Physically Abused: No    Sexually Abused: No   History reviewed. No pertinent family history. Allergies  Allergen Reactions   Amoxicillin Anaphylaxis and Rash   Penicillins Anaphylaxis and Rash   Other Swelling and Other (See Comments)    Pt said he is allergic to tapioca. Causes big knots to appear on his tongue.   Pertussis Vaccines Hives and Other (See Comments)    Per MD, said he was allergic to a medication in the vaccine   Tape Other (See Comments)    Bandages, once removed, leave the skin swollen/puffy/irritated where they were placed   Blue Mussel [Mytilus] Itching, Swelling, Rash and Other (See Comments)    Itchy throat    Review of Systems  Constitutional: Negative.   HENT: Negative.    Eyes: Negative.   Respiratory:  Negative for shortness of breath.   Cardiovascular:  Negative for chest pain.  Gastrointestinal: Negative.   Genitourinary: Negative.   Musculoskeletal: Negative.   Skin: Negative.   Neurological: Negative.   Endo/Heme/Allergies: Negative.  Objective:     BP 128/87 (BP Location: Left Arm, Patient Position: Sitting, Cuff Size: Large)   Pulse 91   Ht 5\' 7"  (1.702 m)   Wt 286 lb (129.7 kg)   SpO2 96%   BMI 44.79 kg/m    Physical Exam Vitals and nursing note reviewed.  Constitutional:      Appearance: Normal appearance. He is obese.  HENT:     Head: Normocephalic and atraumatic.     Right Ear: External ear normal.     Left Ear: External ear normal.     Nose: Nose normal.     Mouth/Throat:     Mouth: Mucous membranes are moist.     Pharynx: Oropharynx is clear.  Eyes:     Extraocular Movements: Extraocular movements intact.     Conjunctiva/sclera: Conjunctivae normal.     Pupils: Pupils are equal, round, and reactive to light.   Cardiovascular:     Rate and Rhythm: Normal rate and regular rhythm.     Pulses: Normal pulses.     Heart sounds: Normal heart sounds.  Pulmonary:     Effort: Pulmonary effort is normal.     Breath sounds: Normal breath sounds.  Musculoskeletal:        General: Normal range of motion.     Cervical back: Normal range of motion and neck supple.  Skin:    General: Skin is warm and dry.  Neurological:     General: No focal deficit present.     Mental Status: He is alert and oriented to person, place, and time.  Psychiatric:        Mood and Affect: Mood normal.        Behavior: Behavior normal.        Thought Content: Thought content normal.        Judgment: Judgment normal.       Assessment & Plan:   Problem List Items Addressed This Visit       Digestive   Gastroesophageal reflux disease with esophagitis without hemorrhage - Primary   Relevant Medications   pantoprazole (PROTONIX) 40 MG tablet     Other   PTSD (post-traumatic stress disorder) (Chronic)   Marijuana abuse   Methamphetamine abuse (HCC)   Vitamin D deficiency   Relevant Orders   Vitamin D, 25-hydroxy (Completed)   Depression, unspecified depression type   1. Gastroesophageal reflux disease with esophagitis without hemorrhage Continue current regimen.  Patient education given on supportive care, lifestyle modifications.  Patient encouraged to follow-up with mobile unit as needed - pantoprazole (PROTONIX) 40 MG tablet; Take 1 tablet (40 mg total) by mouth 2 (two) times daily.  Dispense: 60 tablet; Refill: 1  2. Vitamin D deficiency  - Vitamin D, 25-hydroxy  3. PTSD (post-traumatic stress disorder) Managed by [facility] psychiatry  4. Depression, unspecified depression type   5. Marijuana abuse Currently in substance abuse treatment program  6. Methamphetamine abuse (HCC)   I have reviewed the patient's medical history (PMH, PSH, Social History, Family History, Medications, and allergies) , and  have been updated if relevant. I spent 30 minutes reviewing chart and  face to face time with patient.     Return if symptoms worsen or fail to improve.    Kasandra Knudsen Mayers, PA-C

## 2023-03-20 NOTE — Patient Instructions (Signed)
We will call you with today's lab result.  Roney Jaffe, PA-C Physician Assistant [facility] [email or web address]   Health Maintenance, Male Adopting a healthy lifestyle and getting preventive care are important in promoting health and wellness. Ask your health care provider about: The right schedule for you to have regular tests and exams. Things you can do on your own to prevent diseases and keep yourself healthy. What should I know about diet, weight, and exercise? Eat a healthy diet  Eat a diet that includes plenty of vegetables, fruits, low-fat dairy products, and lean protein. Do not eat a lot of foods that are high in solid fats, added sugars, or sodium. Maintain a healthy weight Body mass index (BMI) is a measurement that can be used to identify possible weight problems. It estimates body fat based on height and weight. Your health care provider can help determine your BMI and help you achieve or maintain a healthy weight. Get regular exercise Get regular exercise. This is one of the most important things you can do for your health. Most adults should: Exercise for at least 150 minutes each week. The exercise should increase your heart rate and make you sweat (moderate-intensity exercise). Do strengthening exercises at least twice a week. This is in addition to the moderate-intensity exercise. Spend less time sitting. Even light physical activity can be beneficial. Watch cholesterol and blood lipids Have your blood tested for lipids and cholesterol at 25 years of age, then have this test every 5 years. You may need to have your cholesterol levels checked more often if: Your lipid or cholesterol levels are high. You are older than 25 years of age. You are at high risk for heart disease. What should I know about cancer screening? Many types of cancers can be detected early and may often be prevented. Depending on your  health history and family history, you may need to have cancer screening at various ages. This may include screening for: Colorectal cancer. Prostate cancer. Skin cancer. Lung cancer. What should I know about heart disease, diabetes, and high blood pressure? Blood pressure and heart disease High blood pressure causes heart disease and increases the risk of stroke. This is more likely to develop in people who have high blood pressure readings or are overweight. Talk with your health care provider about your target blood pressure readings. Have your blood pressure checked: Every 3-5 years if you are 71-71 years of age. Every year if you are 62 years old or older. If you are between the ages of 59 and 42 and are a current or former smoker, ask your health care provider if you should have a one-time screening for abdominal aortic aneurysm (AAA). Diabetes Have regular diabetes screenings. This checks your fasting blood sugar level. Have the screening done: Once every three years after age 36 if you are at a normal weight and have a low risk for diabetes. More often and at a younger age if you are overweight or have a high risk for diabetes. What should I know about preventing infection? Hepatitis B If you have a higher risk for hepatitis B, you should be screened for this virus. Talk with your health care provider to find out if you are at risk for hepatitis B infection. Hepatitis C Blood testing is recommended for: Everyone born from [birth year] through 1963. Anyone with known risk factors for hepatitis C. Sexually transmitted infections (STIs) You should be screened each year for STIs, including gonorrhea and chlamydia, if:  You are sexually active and are younger than 25 years of age. You are older than 25 years of age and your health care provider tells you that you are at risk for this type of infection. Your sexual activity has changed since you were last screened, and you are at increased risk  for chlamydia or gonorrhea. Ask your health care provider if you are at risk. Ask your health care provider about whether you are at high risk for HIV. Your health care provider may recommend a prescription medicine to help prevent HIV infection. If you choose to take medicine to prevent HIV, you should first get tested for HIV. You should then be tested every 3 months for as long as you are taking the medicine. Follow these instructions at home: Alcohol use Do not drink alcohol if your health care provider tells you not to drink. If you drink alcohol: Limit how much you have to 0-2 drinks a day. Know how much alcohol is in your drink. In the [place] one drink equals one 12 oz bottle of beer (355 mL), one 5 oz glass of wine (148 mL), or one 1 oz glass of hard liquor (44 mL). Lifestyle Do not use any products that contain nicotine or tobacco. These products include cigarettes, chewing tobacco, and vaping devices, such as e-cigarettes. If you need help quitting, ask your health care provider. Do not use street drugs. Do not share needles. Ask your health care provider for help if you need support or information about quitting drugs. General instructions Schedule regular health, dental, and eye exams. Stay current with your vaccines. Tell your health care provider if: You often feel depressed. You have ever been abused or do not feel safe at home. Summary Adopting a healthy lifestyle and getting preventive care are important in promoting health and wellness. Follow your health care provider's instructions about healthy diet, exercising, and getting tested or screened for diseases. Follow your health care provider's instructions on monitoring your cholesterol and blood pressure. This information is not intended to replace advice given to you by your health care provider. Make sure you discuss any questions you have with your health care provider. Document Revised: 10/19/2020 Document Reviewed:  10/19/2020 Elsevier Patient Education  2024 [employer].

## 2023-03-21 DIAGNOSIS — K2100 Gastro-esophageal reflux disease with esophagitis, without bleeding: Secondary | ICD-10-CM | POA: Insufficient documentation

## 2023-03-21 LAB — VITAMIN D 25 HYDROXY (VIT D DEFICIENCY, FRACTURES): Vit D, 25-Hydroxy: 20.3 ng/mL — ABNORMAL LOW (ref 30.0–100.0)

## 2023-03-21 MED ORDER — VITAMIN D (ERGOCALCIFEROL) 1.25 MG (50000 UNIT) PO CAPS
50000.0000 [IU] | ORAL_CAPSULE | ORAL | 2 refills | Status: DC
Start: 2023-03-21 — End: 2023-03-22

## 2023-03-21 MED ORDER — PANTOPRAZOLE SODIUM 40 MG PO TBEC
40.0000 mg | DELAYED_RELEASE_TABLET | Freq: Two times a day (BID) | ORAL | 1 refills | Status: DC
Start: 2023-03-21 — End: 2023-03-22

## 2023-03-21 MED ORDER — VITAMIN D (ERGOCALCIFEROL) 1.25 MG (50000 UNIT) PO CAPS
50000.0000 [IU] | ORAL_CAPSULE | ORAL | 2 refills | Status: DC
Start: 2023-03-21 — End: 2023-03-21

## 2023-03-21 NOTE — Addendum Note (Signed)
Addended by: Roney Jaffe on: 03/21/2023 05:46 PM   Modules accepted: Orders

## 2023-03-21 NOTE — Addendum Note (Signed)
Addended by: Roney Jaffe on: 03/21/2023 05:53 AM   Modules accepted: Orders

## 2023-03-22 MED ORDER — VITAMIN D (ERGOCALCIFEROL) 1.25 MG (50000 UNIT) PO CAPS
50000.0000 [IU] | ORAL_CAPSULE | ORAL | 2 refills | Status: DC
Start: 2023-03-22 — End: 2023-05-01

## 2023-03-22 MED ORDER — PANTOPRAZOLE SODIUM 40 MG PO TBEC
40.0000 mg | DELAYED_RELEASE_TABLET | Freq: Two times a day (BID) | ORAL | 1 refills | Status: DC
Start: 2023-03-22 — End: 2023-05-01

## 2023-03-22 NOTE — Addendum Note (Signed)
Addended by: Roney Jaffe on: 03/22/2023 01:08 PM   Modules accepted: Orders

## 2023-03-28 ENCOUNTER — Emergency Department (HOSPITAL_BASED_OUTPATIENT_CLINIC_OR_DEPARTMENT_OTHER)
Admission: EM | Admit: 2023-03-28 | Discharge: 2023-03-28 | Disposition: A | Discharge status: Home / Self Care | Payer: MEDICAID | Attending: Emergency Medicine | Admitting: Emergency Medicine

## 2023-03-28 ENCOUNTER — Encounter (HOSPITAL_BASED_OUTPATIENT_CLINIC_OR_DEPARTMENT_OTHER): Payer: Self-pay | Admitting: Urology

## 2023-03-28 ENCOUNTER — Other Ambulatory Visit (HOSPITAL_BASED_OUTPATIENT_CLINIC_OR_DEPARTMENT_OTHER): Payer: Self-pay

## 2023-03-28 DIAGNOSIS — X58XXXA Exposure to other specified factors, initial encounter: Secondary | ICD-10-CM | POA: Insufficient documentation

## 2023-03-28 DIAGNOSIS — K029 Dental caries, unspecified: Secondary | ICD-10-CM | POA: Insufficient documentation

## 2023-03-28 DIAGNOSIS — K0889 Other specified disorders of teeth and supporting structures: Secondary | ICD-10-CM | POA: Diagnosis present

## 2023-03-28 DIAGNOSIS — S025XXA Fracture of tooth (traumatic), initial encounter for closed fracture: Secondary | ICD-10-CM | POA: Insufficient documentation

## 2023-03-28 MED ORDER — CLINDAMYCIN HCL 300 MG PO CAPS
300.0000 mg | ORAL_CAPSULE | Freq: Three times a day (TID) | ORAL | 0 refills | Status: AC
  Filled 2023-03-28: qty 30, 10d supply, fill #0

## 2023-03-28 NOTE — Discharge Instructions (Addendum)
Continue tylenol and ibuprofen for pain. Take antibiotics are prescribed, follow up with dentist.

## 2023-03-28 NOTE — ED Provider Notes (Signed)
[facility] Provider Note   CSN: [id number] Arrival date & time: 03/28/23  1411     History  Chief Complaint  Patient presents with   Dental Pain    Jon Clayton is a 27 y.o. male.  Patient here right upper dental pain for the last few days.  Nothing makes it worse or better except for ibuprofen helps.  Denies any fever or chills.  No issues eating or drinking.  No facial swelling.  The history is provided by the patient.       Home Medications Prior to Admission medications   Medication Sig Start Date End Date Taking? Authorizing Provider  clindamycin (CLEOCIN) 300 MG capsule Take 1 capsule (300 mg total) by mouth 3 (three) times daily for 10 days. 03/28/23 04/07/23 Yes Demarkus Remmel, DO  albuterol (VENTOLIN HFA) 108 (90 Base) MCG/ACT inhaler Inhale 2 puffs into the lungs every 6 (six) hours as needed for wheezing or shortness of breath. 10/04/22   Mayers, Cari S, PA-C  ibuprofen (ADVIL) 800 MG tablet Take 1 tablet (800 mg total) by mouth every 8 (eight) hours as needed. 10/31/22   Mayers, Cari S, PA-C  nicotine (NICODERM CQ - DOSED IN MG/24 HOURS) 21 mg/24hr patch Place 1 patch (21 mg total) onto the skin daily. 10/04/22   Mayers, Cari S, PA-C  OLANZapine (ZYPREXA) 5 MG tablet Take 1 PO QAM and 3 PO QHS 10/04/22   Mayers, Cari S, PA-C  pantoprazole (PROTONIX) 40 MG tablet Take 1 tablet (40 mg total) by mouth 2 (two) times daily. 03/22/23   Mayers, Cari S, PA-C  prazosin (MINIPRESS) 2 MG capsule Take 1 capsule (2 mg total) by mouth at bedtime. 10/04/22   Mayers, Cari S, PA-C  Vitamin D, Ergocalciferol, (DRISDOL) 1.25 MG (50000 UNIT) CAPS capsule Take 1 capsule (50,000 Units total) by mouth every 7 (seven) days. 03/22/23   Mayers, Cari S, PA-C      Allergies    Amoxicillin, Penicillins, Other, Pertussis vaccines, Tape, and Blue mussel [mytilus]    Review of Systems   Review of Systems  Physical Exam Updated Vital Signs BP 138/76  (BP Location: Left Arm)   Pulse (!) 103   Temp 97.9 F (36.6 C)   Resp 18   Ht 5\' 7"  (1.702 m)   Wt 129.7 kg   SpO2 98%   BMI 44.78 kg/m  Physical Exam Constitutional:      General: He is not in acute distress.    Appearance: He is not ill-appearing.  HENT:     Head: Normocephalic and atraumatic.     Nose: Nose normal.     Mouth/Throat:     Mouth: Mucous membranes are moist.     Comments: Some dental caries throughout but he looks like he has a chipped molar in his right upper teeth area but there is no facial swelling or trismus or drooling or dental abscess Eyes:     Pupils: Pupils are equal, round, and reactive to light.  Cardiovascular:     Pulses: Normal pulses.  Pulmonary:     Effort: Pulmonary effort is normal.  Neurological:     Mental Status: He is alert.     ED Results / Procedures / Treatments   Labs (all labs ordered are listed, but only abnormal results are displayed) Labs Reviewed - No data to display  EKG None  Radiology No results found.  Procedures Procedures    Medications Ordered in ED Medications -  No data to display  ED Course/ Medical Decision Making/ A&P                                 Medical Decision Making Risk Prescription drug management.   Jon Clayton is here with right upper dental pain.  Patient has a chipped molar in the right upper tooth with some dental caries.  There is no abscess or drooling or trismus.  Very well-appearing.  Does not have a dentist will refer him to 1.  Will start antibiotics.  Recommend Tylenol and ibuprofen for pain.  Overall uncomplicated dental pain.  Discharged in good condition.  Understands return precautions.  Normal vitals.  This chart was dictated using voice recognition software.  Despite best efforts to proofread,  errors can occur which can change the documentation meaning.         Final Clinical Impression(s) / ED Diagnoses Final diagnoses:  Pain, dental    Rx / DC Orders ED  Discharge Orders          Ordered    clindamycin (CLEOCIN) 300 MG capsule  3 times daily        03/28/23 1423              Virgina Norfolk, DO 03/28/23 1425

## 2023-03-28 NOTE — ED Notes (Signed)
ED Provider at bedside. 

## 2023-03-28 NOTE — ED Triage Notes (Signed)
Upper right dental pain x 2-3 weeks  Getting some relief with orajel, does not have a dentist  At [facility] now

## 2023-03-28 NOTE — ED Notes (Signed)
Discharge paperwork reviewed entirely with patient, including follow up care. Pain was under control. The patient received instruction and coaching on their prescriptions, and all follow-up questions were answered.  Pt verbalized understanding as well as all parties involved. No questions or concerns voiced at the time of discharge. No acute distress noted.   Pt ambulated out to PVA without incident or assistance.  Pt advised they will seek followup care with a specialist and notify their PCP.

## 2023-04-17 ENCOUNTER — Ambulatory Visit: Payer: MEDICAID | Admitting: Physician Assistant

## 2023-04-18 ENCOUNTER — Encounter: Payer: MEDICAID | Admitting: Physician Assistant

## 2023-04-18 NOTE — Progress Notes (Unsigned)
   Established Patient Office Visit  Subjective   Patient ID: Jon Clayton, male    DOB: 1996/06/01  Age: 27 y.o. MRN: [id number]  No chief complaint on file.   HPI  {History (Optional):23778}  ROS    Objective:     There were no vitals taken for this visit. {Vitals History (Optional):23777}  Physical Exam   No results found for any visits on 04/18/23.  {Labs (Optional):23779}  The ASCVD Risk score (Arnett DK, et al., 2019) failed to calculate for the following reasons:   The 2019 ASCVD risk score is only valid for ages 70 to 70    Assessment & Plan:   Problem List Items Addressed This Visit   None   No follow-ups on file.    Kasandra Knudsen Mayers, PA-C

## 2023-04-19 NOTE — Progress Notes (Signed)
Patient decided not to be seen.

## 2023-05-01 ENCOUNTER — Ambulatory Visit: Payer: MEDICAID | Admitting: Physician Assistant

## 2023-05-01 ENCOUNTER — Encounter: Payer: Self-pay | Admitting: Physician Assistant

## 2023-05-01 VITALS — BP 128/85 | HR 101 | Ht 67.0 in | Wt 291.0 lb

## 2023-05-01 DIAGNOSIS — Z6841 Body Mass Index (BMI) 40.0 and over, adult: Secondary | ICD-10-CM

## 2023-05-01 DIAGNOSIS — K2100 Gastro-esophageal reflux disease with esophagitis, without bleeding: Secondary | ICD-10-CM

## 2023-05-01 DIAGNOSIS — Z111 Encounter for screening for respiratory tuberculosis: Secondary | ICD-10-CM | POA: Diagnosis not present

## 2023-05-01 DIAGNOSIS — J4520 Mild intermittent asthma, uncomplicated: Secondary | ICD-10-CM

## 2023-05-01 DIAGNOSIS — K0889 Other specified disorders of teeth and supporting structures: Secondary | ICD-10-CM | POA: Diagnosis not present

## 2023-05-01 DIAGNOSIS — E6609 Other obesity due to excess calories: Secondary | ICD-10-CM | POA: Diagnosis not present

## 2023-05-01 DIAGNOSIS — E66813 Obesity, class 3: Secondary | ICD-10-CM

## 2023-05-01 DIAGNOSIS — E559 Vitamin D deficiency, unspecified: Secondary | ICD-10-CM

## 2023-05-01 DIAGNOSIS — F1010 Alcohol abuse, uncomplicated: Secondary | ICD-10-CM

## 2023-05-01 DIAGNOSIS — F1510 Other stimulant abuse, uncomplicated: Secondary | ICD-10-CM

## 2023-05-01 DIAGNOSIS — K625 Hemorrhage of anus and rectum: Secondary | ICD-10-CM

## 2023-05-01 MED ORDER — ALBUTEROL SULFATE HFA 108 (90 BASE) MCG/ACT IN AERS: 2.0000 | INHALATION_SPRAY | Freq: Four times a day (QID) | RESPIRATORY_TRACT | 0 refills | Status: DC | PRN

## 2023-05-01 MED ORDER — VITAMIN D (ERGOCALCIFEROL) 1.25 MG (50000 UNIT) PO CAPS: 50000.0000 [IU] | ORAL_CAPSULE | ORAL | 0 refills | Status: DC

## 2023-05-01 MED ORDER — PANTOPRAZOLE SODIUM 40 MG PO TBEC: 40.0000 mg | DELAYED_RELEASE_TABLET | Freq: Two times a day (BID) | ORAL | 1 refills | Status: DC

## 2023-05-01 MED ORDER — IBUPROFEN 800 MG PO TABS: 800.0000 mg | ORAL_TABLET | Freq: Three times a day (TID) | ORAL | 0 refills | Status: DC | PRN

## 2023-05-01 NOTE — Patient Instructions (Addendum)
I sent refills of your medications, you are going to continue taking the vitamin D once a week for 8 more weeks.  I do encourage you to have your vitamin D levels checked after that to see if you need to continue on a weekly prescription or if you need to transition to a daily dose.  Please let us know there is anything else we can do for you  Roney Jaffe, PA-C Physician Assistant [facility] [email or web address]  Vitamin D Deficiency Vitamin D deficiency is when your body does not have enough vitamin D. Vitamin D is important to your body because: It helps the body maintain calcium and phosphorus levels. These are important minerals. It plays a role in bone health. It reduces inflammation. It improves the body's defense system (immune system). If vitamin D deficiency is severe, it can cause a condition in which your bones become soft. In adults, this condition is called osteomalacia. In children, this condition is called rickets. What are the causes? This condition may be caused by: Not eating enough foods that contain vitamin D. Not getting enough natural sun exposure. Having certain digestive system diseases that make it difficult for your body to absorb vitamin D. These diseases include Crohn's disease, long-term (chronic) pancreatitis, and cystic fibrosis. Having had a surgery in which a part of the stomach or a part of the small intestine was removed. What increases the risk? You are more likely to develop this condition if you: Are an older adult. Do not spend much time outdoors. Live in a long-term care facility. Have dark skin. Take certain medicines, such as steroid medicines or certain seizure medicines. Are overweight or obese. Have chronic kidney or liver disease. What are the signs or symptoms? In mild cases of vitamin D deficiency, there may not be any symptoms. If the condition is severe, symptoms may  include: Bone pain. Muscle pain. Not being able to walk normally (abnormal gait). Broken bones caused by a minor injury. Joint pain. How is this diagnosed? This condition may be diagnosed with blood tests. Imaging tests such as X-rays may also be done to look for changes in the bone. How is this treated? Treatment may include taking supplements as told by your health care provider. Your health care provider will tell you what dose is best for you. Supplements may include: Vitamin D. Calcium. Follow these instructions at home: Eating and drinking Eat foods that contain vitamin D, such as: Dairy products, cereals, or juices that have vitamin D added to them (are fortified). Check the label. Fish, such as salmon or trout. Eggs. The vitamin D is in the yolk. Mushrooms that were treated with UV light. Beef liver. The items listed above may not be a complete list of foods and beverages you can eat and drink. Contact a dietitian for more information. General instructions Take over-the-counter and prescription medicines only as told by your health care provider. Take supplements only as told by your health care provider. Get regular, safe exposure to natural sunlight. Do not use a tanning bed. Maintain a healthy weight. Lose weight if needed. Keep all follow-up visits. This is important. How is this prevented? You can get vitamin D by: Eating foods that naturally contain vitamin D. Eating or drinking products that have been fortified with vitamin D, such as cereals, juices, and dairy products, including milk. Taking a vitamin D supplement or a multivitamin that contains vitamin D. Being in the sun. Your body naturally makes vitamin D  when your skin is exposed to sunlight. Your body changes the sunlight into a form of the vitamin that it can use. Contact a health care provider if: Your symptoms do not go away. You feel nauseous or you vomit. You have fewer bowel movements than usual or  are constipated. Summary Vitamin D deficiency is when your body does not have enough vitamin D. Vitamin D helps to keep your bones healthy. Vitamin D deficiency is primarily treated by taking supplements. Your health care provider will suggest what dose is best for you. You can get vitamin D by eating foods that contain vitamin D, by being in the sun, and by taking a vitamin D supplement or a multivitamin that contains vitamin D. This information is not intended to replace advice given to you by your health care provider. Make sure you discuss any questions you have with your health care provider. Document Revised: 03/05/2021 Document Reviewed: 03/05/2021 Elsevier Patient Education  2024 [employer].

## 2023-05-01 NOTE — Progress Notes (Unsigned)
Established Patient Office Visit  Subjective   Patient ID: Jon Clayton, male    DOB: Apr 18, 1996  Age: 27 y.o. MRN: [id number]  Chief Complaint  Patient presents with  . Medication Refill    Needs refills  [facility] two [facility]    Been taking stool softeners  Was  strain - bright red blood  Had improved    Past Medical History:  Diagnosis Date  . Depression   . Psychosis (HCC)   . PTSD (post-traumatic stress disorder)    Social History   Socioeconomic History  . Marital status: Single    Spouse name: Not on file  . Number of children: Not on file  . Years of education: Not on file  . Highest education level: 8th grade  Occupational History  . Not on file  Tobacco Use  . Smoking status: Every Day    Current packs/day: 1.00    Average packs/day: 1 pack/day for 11.0 years (11.0 ttl pk-yrs)    Types: Cigarettes  . Smokeless tobacco: Never  Vaping Use  . Vaping status: Never Used  Substance and Sexual Activity  . Alcohol use: Not Currently    Comment: reports periodic alcohol use  . Drug use: Yes    Frequency: 1.0 times per week    Types: Marijuana    Comment: 1/3 gm daily  . Sexual activity: Not Currently  Other Topics Concern  . Not on file  Social History Narrative  . Not on file   Social Determinants of Health   Financial Resource Strain: Not on file  Food Insecurity: Food Insecurity Present (09/19/2022)   Hunger Vital Sign   . Worried About Programme researcher, broadcasting/film/video in the Last Year: Often true   . Ran Out of Food in the Last Year: Often true  Transportation Needs: Unmet Transportation Needs (09/19/2022)   PRAPARE - Transportation   . Lack of Transportation (Medical): Yes   . Lack of Transportation (Non-Medical): Yes  Physical Activity: Not on file  Stress: Not on file  Social Connections: Not on file  Intimate Partner Violence: Not At Risk (09/19/2022)   Humiliation, Afraid, Rape, and Kick questionnaire   . Fear of Current or Ex-Partner: No    . Emotionally Abused: No   . Physically Abused: No   . Sexually Abused: No   No family history on file. Allergies  Allergen Reactions  . Amoxicillin Anaphylaxis and Rash  . Penicillins Anaphylaxis and Rash  . Other Swelling and Other (See Comments)    Pt said he is allergic to tapioca. Causes big knots to appear on his tongue.  Marland Kitchen Pertussis Vaccines Hives and Other (See Comments)    Per MD, said he was allergic to a medication in the vaccine  . Tape Other (See Comments)    Bandages, once removed, leave the skin swollen/puffy/irritated where they were placed  . Blue Mussel [Mytilus] Itching, Swelling, Rash and Other (See Comments)    Itchy throat    ROS    Objective:     There were no vitals taken for this visit. BP Readings from Last 3 Encounters:  05/01/23 128/85  03/28/23 138/76  03/20/23 128/87   Wt Readings from Last 3 Encounters:  05/01/23 291 lb (132 kg)  03/28/23 285 lb 15 oz (129.7 kg)  03/20/23 286 lb (129.7 kg)    Physical Exam     Assessment & Plan:   Problem List Items Addressed This Visit   None  No follow-ups on file.    Kasandra Knudsen Mayers, PA-C

## 2023-05-02 ENCOUNTER — Encounter: Payer: Self-pay | Admitting: Physician Assistant

## 2023-10-27 NOTE — Progress Notes (Signed)
 Chief Complaint  Patient presents with   Appointment   New Patient    28 yr old male presents today for establishing care. Pt states 15 months clean, dx with fatty liver last week at [facility]. Rbrown rma    Patient has been off of his mental health medications for 3 months. No acute exacerbations of symptoms. He will f/u with [facility] for further evaluation.    Review of Systems  Constitutional: Negative.  Negative for activity change, appetite change and fatigue.  Respiratory:  Negative for chest tightness and shortness of breath.   Cardiovascular:  Negative for chest pain and palpitations.  Endocrine: Negative for cold intolerance.  Neurological:  Negative for dizziness and headaches.  Psychiatric/Behavioral:  Negative for agitation and confusion.      Physical Exam Vitals and nursing note reviewed.   Constitutional:      General: He is not in acute distress.    Appearance: Normal appearance. He is not ill-appearing. He has obesity Eyes:     Extraocular Movements: Extraocular movements intact.     Conjunctiva/sclera: Conjunctivae normal.     Pupils: Pupils are equal, round, and reactive to light.  Neck:     Vascular: No carotid bruit.   Cardiovascular:     Rate and Rhythm: Normal rate and regular rhythm.     Pulses: Normal pulses.     Heart sounds: Normal heart sounds. No murmur heard. Pulmonary:     Effort: Pulmonary effort is normal.     Breath sounds: Normal breath sounds. No wheezing.  Musculoskeletal:     Cervical back: Normal range of motion and neck supple. No tenderness.  Neurological:     Mental Status: He is alert.     Assessment and Plan  Z86.59 Personal history of other mental and behavioral disorders  (primary encounter diagnosis)  Z13.6 Screening for hypertension Plan :  DISCHRG MEDS RECONCILED W/CURRENT MED LIST  Z11.4 Encounter for screening for human immunodeficiency virus (HIV) Plan :  HIV 1/0/2 AG/AB W/CASCADE RFLX SUPPLEMENTAL             TESTING  Z11.59 Special screening examination for viral disease Plan :  HEPATITIS C ANTIBODY WITH REFLEX TO HCV, RNA,           QUANTITATIVE, REAL-TIME PCR (REFL)  Z13.1 Encounter for screening for diabetes mellitus Plan :  HEMOGLOBIN GLYCOSYLATED A1C  Z13.220 Encounter for screening for lipoid disorders Plan :  LIPID PANEL  Z13.0 Screening for disorder of blood and blood-forming organs  Z13.228 Encounter for screening for other metabolic disorders  Z11.3 Screening examination for venereal disease Plan :  RPR (MONITOR) W/REFL TITER  Z68.39 Body mass index (BMI) 39.0-39.9, adult  E66.812,E66.09 Class 2 obesity due to excess calories without serious comorbidity in adult, unspecified BMI    The following were addressed in today's visit: Lifestyle Measures: The patient was counseled regarding nutrition and physical activity Tobacco counseling: provided tobacco cessation counseling  Counseling time: < 3 minutes  Patient agrees with self-management proposals, as in regards to their diagnosis, as discussed during today's office visit.  Follow up in 3 months or as needed. Continue all current medications as previously ordered. Side-effects of the medications explained and discussed at this visit. Patient voiced understanding and agreement.

## 2023-10-30 NOTE — Result Encounter Note (Signed)
 Not excepting calls at this time. Letter mailed rbrown rma

## 2023-10-30 NOTE — Result Encounter Note (Signed)
 More exercise to rasie the level of good cholesterol in the blood. RPR is negative. HepC is negative. A1c is normal. HIV is negative.

## 2024-01-30 NOTE — Progress Notes (Signed)
 Chief Complaint  Patient presents with   Follow Up    Pt presents to follow up on mental health. Pt states that he is in pain daily, it is full body pain. Pt states he has some depression but will be seeing [facility] on a regular basis soon. Pt states he is staying in a halfway house and will not have a place to stay in November. Would like to speak with [facility] about housing.    Patient has schizophrenia being managed by [facility]. Soon may be homeless. Will have the [facility] see/him or call him. Has some generalized pain. No HI or SI.    Review of Systems  Constitutional: Negative.  Negative for activity change, appetite change and fatigue.  Respiratory:  Negative for chest tightness and shortness of breath.   Cardiovascular:  Negative for chest pain and palpitations.  Endocrine: Negative for cold intolerance.  Neurological:  Negative for dizziness and headaches.       Generalized pain.  Psychiatric/Behavioral:  Negative for agitation, confusion, hallucinations, self-injury and suicidal ideas.      Physical Exam Vitals and nursing note reviewed.   Constitutional:      General: He is not in acute distress.    Appearance: Normal appearance. He is not ill-appearing.  Eyes:     Extraocular Movements: Extraocular movements intact.     Conjunctiva/sclera: Conjunctivae normal.     Pupils: Pupils are equal, round, and reactive to light.  Neck:     Vascular: No carotid bruit.   Cardiovascular:     Rate and Rhythm: Normal rate and regular rhythm.     Pulses: Normal pulses.     Heart sounds: Normal heart sounds. No murmur heard. Pulmonary:     Effort: Pulmonary effort is normal.     Breath sounds: Normal breath sounds. No wheezing.  Musculoskeletal:     Cervical back: Normal range of motion and neck supple. No tenderness.  Neurological:     Mental Status: He is alert and oriented to person, place, and time. Mental status is at baseline.  Psychiatric:        Mood and Affect: Mood normal.         Behavior: Behavior normal.     Assessment and Plan  R52 Pain  (primary encounter diagnosis) Plan :  DISCHRG MEDS RECONCILED W/CURRENT MED LIST  [phone] Personal history of other mental and behavioral disorders Plan :  REFERRAL TO [facility] SUPPORT [facility]  R06.2 Wheezing Plan :  ALBUTEROL  SULFATE HFA 90 MCG/ACTUATION            AEROSOL INHALER - Inhale 2 Puffs into the lungs           every 6 (six) hours as needed for wheezing or            shortness of breath.    The following were addressed in today's visit: Lifestyle Measures: BMI follow up plan for 18+: The patient was counseled regarding nutrition and physical activity Tobacco counseling: provided tobacco cessation counseling  Counseling time: < 3 minutes  Patient agrees with self-management proposals, as in regards to their diagnosis, as discussed during today's office visit.  Follow up in 3 months or as needed. Continue all current medications as previously ordered. Side-effects of the medications explained and discussed at this visit. Patient voiced understanding and agreement.

## 2024-03-11 NOTE — Progress Notes (Signed)
 Chief Complaint  Patient presents with   Appointment    Pt presents with nausea vomiting and headaches for 5 days. Pt also states that he is having SOB and chest pain.     Patient has had nausea and vomiting for 5 days now. No ST No sick contacts.Now with a cough and shortness of breath. Negative for the flu today.    Review of Systems  Constitutional:  Positive for chills and fever.  HENT:  Positive for postnasal drip. Negative for sore throat.   Respiratory:  Positive for cough and shortness of breath.   Skin:  Negative for rash.     Physical Exam Vitals and nursing note reviewed.   Constitutional:      General: He is not in acute distress.    Appearance: Normal appearance. He is not ill-appearing.  Eyes:     Extraocular Movements: Extraocular movements intact.     Conjunctiva/sclera: Conjunctivae normal.     Pupils: Pupils are equal, round, and reactive to light.  Neck:     Vascular: No carotid bruit.   Cardiovascular:     Rate and Rhythm: Normal rate and regular rhythm.     Pulses: Normal pulses.     Heart sounds: Normal heart sounds. No murmur heard. Pulmonary:     Effort: Pulmonary effort is normal.     Breath sounds: Normal breath sounds. No wheezing.  Musculoskeletal:     Cervical back: Normal range of motion and neck supple. No tenderness.  Neurological:     Mental Status: He is alert.     Assessment and Plan  B34.9 Viral syndrome  (primary encounter diagnosis) Plan :  INFLUENZA A & B, OSOM (POCT)            BLOOD COUNT COMPLETE AUTO&AUTO DIFRNTL WBC  Z23 Need for vaccination  R06.02 Shortness of breath Plan :  RADIOLOGIC EXAM CHEST 2 VIEWS  E66.812,E66.09,Z68.36 Class 2 obesity due to excess calories without serious comorbidity with body mass index (BMI) of 36.0 to 36.9 in adult Plan :  LIPID PANEL            COMPREHENSIVE METABOLIC PANEL            HEMOGLOBIN GLYCOSYLATED A1C  Z11.4 Encounter for screening for human immunodeficiency virus  (HIV) Plan :  HIV 1/0/2 AG/AB W/CASCADE RFLX SUPPLEMENTAL            TESTING  Z11.59 Encounter for hepatitis C screening test for low risk patient Plan :  HEPATITIS C ANTIBODY WITH REFLEX TO HCV, RNA,           QUANTITATIVE, REAL-TIME PCR (REFL)    The following were addressed in today's visit: Lifestyle Measures: BMI follow up plan for 18+: The patient was counseled regarding nutrition and physical activity Tobacco counseling: provided tobacco cessation counseling  Counseling time: < 3 minutes  Patient agrees with self-management proposals, as in regards to their diagnosis, as discussed during today's office visit.  Follow up in 3 months or as needed. Continue all current medications as previously ordered. Side-effects of the medications explained and discussed at this visit. Patient voiced understanding and agreement.

## 2024-03-15 NOTE — Result Encounter Note (Signed)
 Called to give results left message.

## 2024-04-05 NOTE — Result Encounter Note (Signed)
 Called to give results mb full, sending letter.

## 2024-06-03 ENCOUNTER — Encounter: Payer: Self-pay | Admitting: Family

## 2024-06-03 ENCOUNTER — Inpatient Hospital Stay
Admission: AD | Admit: 2024-06-03 | Discharge: 2024-06-18 | DRG: 885 | Disposition: A | Discharge status: Home / Self Care | Payer: MEDICAID | Source: Intra-hospital | Attending: Psychiatry | Admitting: Psychiatry

## 2024-06-03 ENCOUNTER — Other Ambulatory Visit: Payer: Self-pay

## 2024-06-03 ENCOUNTER — Ambulatory Visit (HOSPITAL_COMMUNITY)
Admission: EM | Admit: 2024-06-03 | Discharge: 2024-06-03 | Disposition: A | Discharge status: Other Acute Inpatient Hospital | Payer: MEDICAID | Attending: Nurse Practitioner | Admitting: Nurse Practitioner

## 2024-06-03 DIAGNOSIS — F323 Major depressive disorder, single episode, severe with psychotic features: Secondary | ICD-10-CM | POA: Diagnosis not present

## 2024-06-03 DIAGNOSIS — Z5941 Food insecurity: Secondary | ICD-10-CM

## 2024-06-03 DIAGNOSIS — Z880 Allergy status to penicillin: Secondary | ICD-10-CM | POA: Diagnosis not present

## 2024-06-03 DIAGNOSIS — Z555 Less than a high school diploma: Secondary | ICD-10-CM

## 2024-06-03 DIAGNOSIS — R45851 Suicidal ideations: Secondary | ICD-10-CM | POA: Diagnosis present

## 2024-06-03 DIAGNOSIS — Z653 Problems related to other legal circumstances: Secondary | ICD-10-CM | POA: Diagnosis not present

## 2024-06-03 DIAGNOSIS — Z79899 Other long term (current) drug therapy: Secondary | ICD-10-CM | POA: Diagnosis not present

## 2024-06-03 DIAGNOSIS — F4310 Post-traumatic stress disorder, unspecified: Secondary | ICD-10-CM | POA: Diagnosis present

## 2024-06-03 DIAGNOSIS — F17210 Nicotine dependence, cigarettes, uncomplicated: Secondary | ICD-10-CM | POA: Diagnosis present

## 2024-06-03 DIAGNOSIS — F410 Panic disorder [episodic paroxysmal anxiety] without agoraphobia: Secondary | ICD-10-CM | POA: Diagnosis present

## 2024-06-03 DIAGNOSIS — F603 Borderline personality disorder: Secondary | ICD-10-CM | POA: Diagnosis present

## 2024-06-03 DIAGNOSIS — F331 Major depressive disorder, recurrent, moderate: Secondary | ICD-10-CM | POA: Insufficient documentation

## 2024-06-03 DIAGNOSIS — Z9152 Personal history of nonsuicidal self-harm: Secondary | ICD-10-CM

## 2024-06-03 DIAGNOSIS — Z9151 Personal history of suicidal behavior: Secondary | ICD-10-CM

## 2024-06-03 DIAGNOSIS — Z5982 Transportation insecurity: Secondary | ICD-10-CM

## 2024-06-03 DIAGNOSIS — F339 Major depressive disorder, recurrent, unspecified: Secondary | ICD-10-CM | POA: Diagnosis not present

## 2024-06-03 DIAGNOSIS — F515 Nightmare disorder: Secondary | ICD-10-CM | POA: Diagnosis present

## 2024-06-03 DIAGNOSIS — J4520 Mild intermittent asthma, uncomplicated: Secondary | ICD-10-CM

## 2024-06-03 DIAGNOSIS — F333 Major depressive disorder, recurrent, severe with psychotic symptoms: Principal | ICD-10-CM | POA: Diagnosis present

## 2024-06-03 DIAGNOSIS — Z91148 Patient's other noncompliance with medication regimen for other reason: Secondary | ICD-10-CM

## 2024-06-03 DIAGNOSIS — Z5900 Homelessness unspecified: Secondary | ICD-10-CM | POA: Diagnosis not present

## 2024-06-03 LAB — CBC WITH DIFFERENTIAL/PLATELET
Abs Immature Granulocytes: 0.12 K/uL — ABNORMAL HIGH (ref 0.00–0.07)
Basophils Absolute: 0 K/uL (ref 0.0–0.1)
Basophils Relative: 0 %
Eosinophils Absolute: 0 K/uL (ref 0.0–0.5)
Eosinophils Relative: 0 %
HCT: 52.6 % — ABNORMAL HIGH (ref 39.0–52.0)
Hemoglobin: 17.5 g/dL — ABNORMAL HIGH (ref 13.0–17.0)
Immature Granulocytes: 1 %
Lymphocytes Relative: 11 %
Lymphs Abs: 2.3 K/uL (ref 0.7–4.0)
MCH: 29.1 pg (ref 26.0–34.0)
MCHC: 33.3 g/dL (ref 30.0–36.0)
MCV: 87.4 fL (ref 80.0–100.0)
Monocytes Absolute: 0.8 K/uL (ref 0.1–1.0)
Monocytes Relative: 4 %
Neutro Abs: 16.7 K/uL — ABNORMAL HIGH (ref 1.7–7.7)
Neutrophils Relative %: 84 %
Platelets: 368 K/uL (ref 150–400)
RBC: 6.02 MIL/uL — ABNORMAL HIGH (ref 4.22–5.81)
RDW: 14.1 % (ref 11.5–15.5)
WBC: 19.9 K/uL — ABNORMAL HIGH (ref 4.0–10.5)
nRBC: 0 % (ref 0.0–0.2)

## 2024-06-03 LAB — COMPREHENSIVE METABOLIC PANEL WITH GFR
ALT: 75 U/L — ABNORMAL HIGH (ref 0–44)
AST: 50 U/L — ABNORMAL HIGH (ref 15–41)
Albumin: 5.3 g/dL — ABNORMAL HIGH (ref 3.5–5.0)
Alkaline Phosphatase: 88 U/L (ref 38–126)
Anion gap: 18 — ABNORMAL HIGH (ref 5–15)
BUN: 5 mg/dL — ABNORMAL LOW (ref 6–20)
CO2: 24 mmol/L (ref 22–32)
Calcium: 10.1 mg/dL (ref 8.9–10.3)
Chloride: 102 mmol/L (ref 98–111)
Creatinine, Ser: 1.1 mg/dL (ref 0.61–1.24)
GFR, Estimated: >60 mL/min
Glucose, Bld: 136 mg/dL — ABNORMAL HIGH (ref 70–99)
Potassium: 4.3 mmol/L (ref 3.5–5.1)
Sodium: 144 mmol/L (ref 135–145)
Total Bilirubin: 0.5 mg/dL (ref 0.0–1.2)
Total Protein: 7.7 g/dL (ref 6.5–8.1)

## 2024-06-03 LAB — POCT URINE DRUG SCREEN - MANUAL ENTRY (I-SCREEN)
POC Amphetamine UR: NOT DETECTED
POC Buprenorphine (BUP): POSITIVE — AB
POC Cocaine UR: NOT DETECTED
POC Marijuana UR: POSITIVE — AB
POC Methadone UR: NOT DETECTED
POC Methamphetamine UR: NOT DETECTED
POC Morphine: NOT DETECTED
POC Oxazepam (BZO): NOT DETECTED
POC Oxycodone UR: NOT DETECTED
POC Secobarbital (BAR): NOT DETECTED

## 2024-06-03 LAB — ETHANOL: Alcohol, Ethyl (B): <15 mg/dL

## 2024-06-03 MED ORDER — HALOPERIDOL LACTATE 5 MG/ML IJ SOLN: 5.0000 mg | Freq: Three times a day (TID) | INTRAMUSCULAR | Status: DC | PRN

## 2024-06-03 MED ORDER — MAGNESIUM HYDROXIDE 400 MG/5ML PO SUSP: 30.0000 mL | Freq: Every day | ORAL | Status: DC | PRN

## 2024-06-03 MED ORDER — OLANZAPINE 5 MG PO TABS
5.0000 mg | ORAL_TABLET | Freq: Two times a day (BID) | ORAL | Status: DC
  Administered 2024-06-03: 5 mg via ORAL
  Filled 2024-06-03: qty 1

## 2024-06-03 MED ORDER — LORAZEPAM 2 MG/ML IJ SOLN: 2.0000 mg | Freq: Three times a day (TID) | INTRAMUSCULAR | Status: DC | PRN

## 2024-06-03 MED ORDER — ALBUTEROL SULFATE HFA 108 (90 BASE) MCG/ACT IN AERS: 2.0000 | INHALATION_SPRAY | Freq: Four times a day (QID) | RESPIRATORY_TRACT | Status: DC | PRN

## 2024-06-03 MED ORDER — TRAZODONE HCL 50 MG PO TABS
50.0000 mg | ORAL_TABLET | Freq: Every evening | ORAL | Status: DC | PRN
  Administered 2024-06-03 – 2024-06-17 (×14): 50 mg via ORAL
  Filled 2024-06-03 (×8): qty 1

## 2024-06-03 MED ORDER — LORAZEPAM 2 MG/ML IJ SOLN
2.0000 mg | Freq: Three times a day (TID) | INTRAMUSCULAR | Status: DC | PRN
  Administered 2024-06-14: 2 mg via INTRAMUSCULAR

## 2024-06-03 MED ORDER — OLANZAPINE 5 MG PO TABS
5.0000 mg | ORAL_TABLET | Freq: Two times a day (BID) | ORAL | Status: DC
  Administered 2024-06-03 – 2024-06-08 (×10): 5 mg via ORAL
  Filled 2024-06-03 (×10): qty 1

## 2024-06-03 MED ORDER — DIPHENHYDRAMINE HCL 50 MG/ML IJ SOLN
50.0000 mg | Freq: Three times a day (TID) | INTRAMUSCULAR | Status: DC | PRN
  Administered 2024-06-14: 50 mg via INTRAMUSCULAR

## 2024-06-03 MED ORDER — DIPHENHYDRAMINE HCL 50 MG/ML IJ SOLN: 50.0000 mg | Freq: Three times a day (TID) | INTRAMUSCULAR | Status: DC | PRN

## 2024-06-03 MED ORDER — ACETAMINOPHEN 325 MG PO TABS: 650.0000 mg | ORAL_TABLET | Freq: Four times a day (QID) | ORAL | Status: DC | PRN

## 2024-06-03 MED ORDER — HALOPERIDOL 5 MG PO TABS
5.0000 mg | ORAL_TABLET | Freq: Three times a day (TID) | ORAL | Status: DC | PRN
  Administered 2024-06-07 – 2024-06-11 (×4): 5 mg via ORAL
  Filled 2024-06-03 (×2): qty 1

## 2024-06-03 MED ORDER — PRAZOSIN HCL 2 MG PO CAPS
2.0000 mg | ORAL_CAPSULE | Freq: Every day | ORAL | Status: DC
  Administered 2024-06-03 – 2024-06-08 (×6): 2 mg via ORAL
  Filled 2024-06-03 (×6): qty 1

## 2024-06-03 MED ORDER — ONDANSETRON 4 MG PO TBDP
4.0000 mg | ORAL_TABLET | Freq: Three times a day (TID) | ORAL | Status: DC | PRN
  Administered 2024-06-03: 4 mg via ORAL
  Filled 2024-06-03: qty 1

## 2024-06-03 MED ORDER — MAGNESIUM HYDROXIDE 400 MG/5ML PO SUSP
30.0000 mL | Freq: Every day | ORAL | Status: DC | PRN
  Administered 2024-06-05 – 2024-06-10 (×2): 30 mL via ORAL
  Filled 2024-06-03: qty 30

## 2024-06-03 MED ORDER — ALUM & MAG HYDROXIDE-SIMETH 200-200-20 MG/5ML PO SUSP: 30.0000 mL | ORAL | Status: DC | PRN

## 2024-06-03 MED ORDER — HALOPERIDOL LACTATE 5 MG/ML IJ SOLN: 10.0000 mg | Freq: Three times a day (TID) | INTRAMUSCULAR | Status: DC | PRN

## 2024-06-03 MED ORDER — TRAZODONE HCL 50 MG PO TABS
50.0000 mg | ORAL_TABLET | Freq: Every evening | ORAL | Status: DC | PRN
  Administered 2024-06-03: 50 mg via ORAL
  Filled 2024-06-03: qty 1

## 2024-06-03 MED ORDER — ONDANSETRON 4 MG PO TBDP: 4.0000 mg | ORAL_TABLET | Freq: Three times a day (TID) | ORAL | Status: DC | PRN

## 2024-06-03 MED ORDER — DIPHENHYDRAMINE HCL 25 MG PO CAPS
50.0000 mg | ORAL_CAPSULE | Freq: Three times a day (TID) | ORAL | Status: DC | PRN
  Administered 2024-06-07 – 2024-06-11 (×4): 50 mg via ORAL
  Filled 2024-06-03 (×2): qty 2

## 2024-06-03 MED ORDER — PRAZOSIN HCL 2 MG PO CAPS: 2.0000 mg | ORAL_CAPSULE | Freq: Every day | ORAL | Status: DC

## 2024-06-03 MED ORDER — ALBUTEROL SULFATE HFA 108 (90 BASE) MCG/ACT IN AERS
2.0000 | INHALATION_SPRAY | Freq: Four times a day (QID) | RESPIRATORY_TRACT | Status: DC | PRN
  Administered 2024-06-13 – 2024-06-18 (×2): 2 via RESPIRATORY_TRACT

## 2024-06-03 MED ORDER — HALOPERIDOL 5 MG PO TABS: 5.0000 mg | ORAL_TABLET | Freq: Three times a day (TID) | ORAL | Status: DC | PRN

## 2024-06-03 MED ORDER — ACETAMINOPHEN 325 MG PO TABS
650.0000 mg | ORAL_TABLET | Freq: Four times a day (QID) | ORAL | Status: DC | PRN
  Administered 2024-06-03 – 2024-06-05 (×3): 650 mg via ORAL
  Filled 2024-06-03 (×3): qty 2

## 2024-06-03 MED ORDER — HYDROXYZINE HCL 25 MG PO TABS
25.0000 mg | ORAL_TABLET | Freq: Three times a day (TID) | ORAL | Status: DC | PRN
  Administered 2024-06-03 – 2024-06-17 (×19): 25 mg via ORAL
  Filled 2024-06-03 (×14): qty 1

## 2024-06-03 MED ORDER — HALOPERIDOL LACTATE 5 MG/ML IJ SOLN
10.0000 mg | Freq: Three times a day (TID) | INTRAMUSCULAR | Status: DC | PRN
  Administered 2024-06-14: 10 mg via INTRAMUSCULAR

## 2024-06-03 MED ORDER — HYDROXYZINE HCL 25 MG PO TABS: 25.0000 mg | ORAL_TABLET | Freq: Three times a day (TID) | ORAL | Status: DC | PRN

## 2024-06-03 MED ORDER — DIPHENHYDRAMINE HCL 50 MG PO CAPS: 50.0000 mg | ORAL_CAPSULE | Freq: Three times a day (TID) | ORAL | Status: DC | PRN

## 2024-06-03 NOTE — Progress Notes (Signed)
 Patient was able to get a small UDS sample.  Patient also stated he was feeling nauseous and requested something for nausea.
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
## 2024-06-03 NOTE — Progress Notes (Signed)
 Pt calm and pleasant during assessment denying SI/HI. Pt endorses A/H but stated they aren't telling him to hurt himself tonight. Pt isolative to his room. Pt compliant with medication administration per MD orders. Pt given education, support, and encouragement to be active in his treatment plan. Pt being monitored Q 15 minutes for safety per unit protocol, remains safe on the unit
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
## 2024-06-03 NOTE — ED Provider Notes (Signed)
 [facility] Continuous Assessment Admission H&P  Date: 06/03/2024 Patient Name: Jon Clayton MRN: [id number] Chief Complaint: suicidal ideation  Diagnoses:  Final diagnoses:  Major depressive disorder with psychotic features [facility])    HPI: Jon Clayton is a 28 y/o single male presented to [facility] as a walk in voluntarily unaccompanied by with complaints of suicidal ideation with a plan.   Jon Clayton, 28 y.o., male patient seen face to face by this provider and chart reviewed on 06/03/2024.  On evaluation Jon Clayton reports that he went to [facility] in [place] for suicidal ideations and was told to come to [facility]. Patient reports his parole officer brought him to [facility] Patient reports that he was subletting an apartment and the person he was paying rent to did not give the money to the owner. Patient reports that he is being evicted this week and this triggered his suicidal ideations. Patient reports that he has been hearing voices and seeing shadows Patient reports that he is on parole and cannot be without housing for more than three days. Patient reports that he has been off his medications for a few a months. Patient reports that he did not follow up with his outpatient providers for ongoing medication management. Patient reports that he wants to go to [facility] for a longer inpatient stay.   During evaluation Jon Clayton is in no acute distress. He is alert, oriented x 4, calm, cooperative and attentive. His mood is euthymic with congruent affect. He has normal speech, and behavior.  Objectively there is no evidence of psychosis/mania or delusional thinking.  Patient is able to converse coherently, goal directed thoughts, and does not appear to be responding to any internal or external stimuli.  He endorses suicidal ideation and auditory and visual hallucinations. Patient answered question appropriately.     Patient will be admitted to [facility] continuous observation overnight  for crisis management, safety and stabilization and can be discharged in the morning to follow up with [facility].for ongoing mental health treatment.   Total Time spent with patient: 30 minutes  Musculoskeletal  Strength & Muscle Tone: within normal limits Gait & Station: normal Patient leans: N/A  Psychiatric Specialty Exam  Presentation General Appearance:  Casual  Eye Contact: Fair  Speech: Clear and Coherent  Speech Volume: Normal  Handedness: Right   Mood and Affect  Mood: Depressed  Affect: Appropriate   Thought Process  Thought Processes: Coherent  Descriptions of Associations:Intact  Orientation:Full (Time, Place and Person)  Thought Content:Delusions  Diagnosis of Schizophrenia or Schizoaffective disorder in past: No  Duration of Psychotic Symptoms: Greater than six months  Hallucinations:Hallucinations: Auditory; Visual; Command Description of Command Hallucinations: to cut himself Description of Auditory Hallucinations: hears vocie telling hime to cut himself Description of Visual Hallucinations: shadows and shadow people  Ideas of Reference:Delusions  Suicidal Thoughts:Suicidal Thoughts: Yes, Active SI Active Intent and/or Plan: With Intent; With Plan  Homicidal Thoughts:Homicidal Thoughts: No   Sensorium  Memory: Immediate Fair; Remote Fair; Recent Fair  Judgment: Fair  Insight: Fair   Chartered Certified Accountant: Fair  Attention Span: Fair  Recall: [employer] of Knowledge: Fair  Language: Fair   Psychomotor Activity  Psychomotor Activity: Psychomotor Activity: Normal   Assets  Assets: Desire for Improvement; Physical Health; Communication Skills   Sleep  Sleep: Sleep: Fair   Nutritional Assessment (For OBS and FBC admissions only) Has the patient had a weight loss or gain of 10 pounds or more  in the last 3 months?: No Has the patient had a decrease in food intake/or appetite?: No Does the  patient have dental problems?: No Does the patient have eating habits or behaviors that may be indicators of an eating disorder including binging or inducing vomiting?: No Has the patient recently lost weight without trying?: 0 Has the patient been eating poorly because of a decreased appetite?: 0 Malnutrition Screening Tool Score: 0    Physical Exam HENT:     Head: Normocephalic.     Nose: Nose normal.  Cardiovascular:     Rate and Rhythm: Normal rate.  Abdominal:     General: Abdomen is flat.  Musculoskeletal:        General: Normal range of motion.     Cervical back: Normal range of motion.  Skin:    General: Skin is warm.  Neurological:     Mental Status: He is alert and oriented to person, place, and time.  Psychiatric:        Attention and Perception: Attention normal.        Mood and Affect: Mood is anxious and depressed.        Speech: Speech normal.        Behavior: Behavior is cooperative.        Thought Content: Thought content is delusional. Thought content includes suicidal ideation. Thought content includes suicidal plan.        Cognition and Memory: Cognition normal.        Judgment: Judgment is impulsive.    Review of Systems  Constitutional: Negative.   HENT: Negative.    Eyes: Negative.   Respiratory: Negative.    Cardiovascular: Negative.   Gastrointestinal: Negative.   Genitourinary: Negative.   Musculoskeletal: Negative.   Skin: Negative.   Neurological: Negative.   Endo/Heme/Allergies: Negative.   Psychiatric/Behavioral:  Positive for depression and suicidal ideas.     Blood pressure (!) 115/90, pulse 99, temperature (!) 97.5 F (36.4 C), temperature source Oral, resp. rate 20, SpO2 98%. There is no height or weight on file to calculate BMI.  Past Psychiatric History: [facility]  Is the patient at risk to self? Yes  Has the patient been a risk to self in the past 6 months? No .    Has the patient been a risk to self within the  distant past? Yes   Is the patient a risk to others? No   Has the patient been a risk to others in the past 6 months? No   Has the patient been a risk to others within the distant past? No   Past Medical History: no significant medical history  Family History: Mother-schizophrenia, father-schizophrenia  Social History: 28 y/o male, single, on parole   Last Labs:  Admission on 06/03/2024  Component Date Value Ref Range Status   WBC 06/03/2024 19.9 (H)  4.0 - 10.5 K/uL Final   RBC 06/03/2024 6.02 (H)  4.22 - 5.81 MIL/uL Final   Hemoglobin 06/03/2024 17.5 (H)  13.0 - 17.0 g/dL Final   HCT 87/77/7974 52.6 (H)  39.0 - 52.0 % Final   MCV 06/03/2024 87.4  80.0 - 100.0 fL Final   MCH 06/03/2024 29.1  26.0 - 34.0 pg Final   MCHC 06/03/2024 33.3  30.0 - 36.0 g/dL Final   RDW 87/77/7974 14.1  11.5 - 15.5 % Final   Platelets 06/03/2024 368  150 - 400 K/uL Final   nRBC 06/03/2024 0.0  0.0 - 0.2 % Final  Neutrophils Relative % 06/03/2024 84  % Final   Neutro Abs 06/03/2024 16.7 (H)  1.7 - 7.7 K/uL Final   Lymphocytes Relative 06/03/2024 11  % Final   Lymphs Abs 06/03/2024 2.3  0.7 - 4.0 K/uL Final   Monocytes Relative 06/03/2024 4  % Final   Monocytes Absolute 06/03/2024 0.8  0.1 - 1.0 K/uL Final   Eosinophils Relative 06/03/2024 0  % Final   Eosinophils Absolute 06/03/2024 0.0  0.0 - 0.5 K/uL Final   Basophils Relative 06/03/2024 0  % Final   Basophils Absolute 06/03/2024 0.0  0.0 - 0.1 K/uL Final   Immature Granulocytes 06/03/2024 1  % Final   Abs Immature Granulocytes 06/03/2024 0.12 (H)  0.00 - 0.07 K/uL Final   Performed at [facility], [address] [place], [place] [address]   Sodium 06/03/2024 144  135 - 145 mmol/L Final   Potassium 06/03/2024 4.3  3.5 - 5.1 mmol/L Final   Chloride 06/03/2024 102  98 - 111 mmol/L Final   CO2 06/03/2024 24  22 - 32 mmol/L Final   Glucose, Bld 06/03/2024 136 (H)  70 - 99 mg/dL Final   Glucose reference range applies only to samples taken  after fasting for at least 8 hours.   BUN 06/03/2024 5 (L)  6 - 20 mg/dL Final   Creatinine, Ser 06/03/2024 1.10  0.61 - 1.24 mg/dL Final   Calcium 87/77/7974 10.1  8.9 - 10.3 mg/dL Final   Total Protein 87/77/7974 7.7  6.5 - 8.1 g/dL Final   Albumin 87/77/7974 5.3 (H)  3.5 - 5.0 g/dL Final   AST 87/77/7974 50 (H)  15 - 41 U/L Final   ALT 06/03/2024 75 (H)  0 - 44 U/L Final   Alkaline Phosphatase 06/03/2024 88  38 - 126 U/L Final   Total Bilirubin 06/03/2024 0.5  0.0 - 1.2 mg/dL Final   GFR, Estimated 06/03/2024 >60  >60 mL/min Final   Comment: (NOTE) Calculated using the CKD-EPI Creatinine Equation (2021)    Anion gap 06/03/2024 18 (H)  5 - 15 Final   Performed at [facility], [address], [place], [place] [address]   Alcohol, Ethyl (B) 06/03/2024 <15  <15 mg/dL Final   Comment: (NOTE) For medical purposes only. Performed at [facility], [address], [place], [place] [address]    POC Amphetamine UR 06/03/2024 None Detected  NONE DETECTED (Cut Off Level 1000 ng/mL) Final   POC Secobarbital (BAR) 06/03/2024 None Detected  NONE DETECTED (Cut Off Level 300 ng/mL) Final   POC Buprenorphine (BUP) 06/03/2024 Positive (A)  NONE DETECTED (Cut Off Level 10 ng/mL) Final   POC Oxazepam (BZO) 06/03/2024 None Detected  NONE DETECTED (Cut Off Level 300 ng/mL) Final   POC Cocaine UR 06/03/2024 None Detected  NONE DETECTED (Cut Off Level 300 ng/mL) Final   POC Methamphetamine UR 06/03/2024 None Detected  NONE DETECTED (Cut Off Level 1000 ng/mL) Final   POC Morphine 06/03/2024 None Detected  NONE DETECTED (Cut Off Level 300 ng/mL) Final   POC Methadone UR 06/03/2024 None Detected  NONE DETECTED (Cut Off Level 300 ng/mL) Final   POC Oxycodone UR 06/03/2024 None Detected  NONE DETECTED (Cut Off Level 100 ng/mL) Final   POC Marijuana UR 06/03/2024 Positive (A)  NONE DETECTED (Cut Off Level 50 ng/mL) Final    Allergies: Amoxicillin, Penicillins, Other, Pertussis vaccines, Tape, and  Blue mussel [mytilus]  Medications:  Facility Ordered Medications  Medication  acetaminophen  (TYLENOL ) tablet 650 mg   alum & mag hydroxide-simeth (MAALOX/MYLANTA) 200-200-20 MG/5ML suspension 30 mL   magnesium  hydroxide (MILK OF MAGNESIA) suspension 30 mL   haloperidol  (HALDOL ) tablet 5 mg   And   diphenhydrAMINE  (BENADRYL ) capsule 50 mg   haloperidol  lactate (HALDOL ) injection 5 mg   And   diphenhydrAMINE  (BENADRYL ) injection 50 mg   And   LORazepam  (ATIVAN ) injection 2 mg   haloperidol  lactate (HALDOL ) injection 10 mg   And   diphenhydrAMINE  (BENADRYL ) injection 50 mg   And   LORazepam  (ATIVAN ) injection 2 mg   hydrOXYzine  (ATARAX ) tablet 25 mg   traZODone  (DESYREL ) tablet 50 mg   ondansetron  (ZOFRAN -ODT) disintegrating tablet 4 mg   albuterol  (VENTOLIN  HFA) 108 (90 Base) MCG/ACT inhaler 2 puff   OLANZapine  (ZYPREXA ) tablet 5 mg   prazosin  (MINIPRESS ) capsule 2 mg   PTA Medications  Medication Sig   nicotine  (NICODERM CQ  - DOSED IN MG/24 HOURS) 21 mg/24hr patch Place 1 patch (21 mg total) onto the skin daily.   OLANZapine  (ZYPREXA ) 5 MG tablet Take 1 PO QAM and 3 PO QHS   prazosin  (MINIPRESS ) 2 MG capsule Take 1 capsule (2 mg total) by mouth at bedtime.   albuterol  (VENTOLIN  HFA) 108 (90 Base) MCG/ACT inhaler Inhale 2 puffs into the lungs every 6 (six) hours as needed for wheezing or shortness of breath.   ibuprofen  (ADVIL ) 800 MG tablet Take 1 tablet (800 mg total) by mouth every 8 (eight) hours as needed.   pantoprazole  (PROTONIX ) 40 MG tablet Take 1 tablet (40 mg total) by mouth 2 (two) times daily.   Vitamin D , Ergocalciferol , (DRISDOL ) 1.25 MG (50000 UNIT) CAPS capsule Take 1 capsule (50,000 Units total) by mouth every 7 (seven) days.      Medical Decision Making  Jon Clayton is a 28 y/o single male presented to [facility] as a walk in voluntarily unaccompanied by with complaints of suicidal ideation with a plan.     Recommendations  Based on my evaluation the  patient does not appear to have an emergency medical condition. Patient will be admitted to [facility] continuous observation overnight for crisis management, safety and stabilization and can be discharged in the morning to [facility] for ongoing treatment.  Gisele Pack E Rashon Westrup, NP 06/03/2024  4:43 AM
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
## 2024-06-03 NOTE — Tx Team (Signed)
 Initial Treatment Plan 06/03/2024 5:22 PM [name] FMW:[id number]    PATIENT STRESSORS: Financial difficulties   Health problems   Medication change or noncompliance   Occupational concerns   Substance abuse   Traumatic event     PATIENT STRENGTHS: Ability for insight  Average or above average intelligence  Communication skills  General fund of knowledge  Motivation for treatment/growth    PATIENT IDENTIFIED PROBLEMS: Homelessness  Relapse  Suicidal thoughts and behaviors  Loss of vocational status  Lack of support             DISCHARGE CRITERIA:  Ability to meet basic life and health needs Improved stabilization in mood, thinking, and/or behavior Medical problems require only outpatient monitoring Motivation to continue treatment in a less acute level of care Need for constant or close observation no longer present Reduction of life-threatening or endangering symptoms to within safe limits Safe-care adequate arrangements made Verbal commitment to aftercare and medication compliance  PRELIMINARY DISCHARGE PLAN: Attend aftercare/continuing care group Attend 12-step recovery group Outpatient therapy Placement in alternative living arrangements  PATIENT/FAMILY INVOLVEMENT: This treatment plan has been presented to and reviewed with the patient, Jon Clayton.  The patient has been given the opportunity to ask questions and make suggestions.  [name], RN 06/03/2024, 5:22 PM

## 2024-06-03 NOTE — ED Notes (Signed)
 Patient A&Ox4. Denies SI/HI currently. Has had SI recently. Denies A/VH. Pt as c/o nausea. Support and encouragement provided. Routine safety checks conducted according to facility protocol. Encouraged patient to notify staff if thoughts of harm toward self or others arise. Patient verbalize understanding and agreement. Will continue to monitor for safety.

## 2024-06-03 NOTE — Progress Notes (Addendum)
 Patient arrived on the unit voluntarily from [facility] at [phone]. Patient alert and oriented. Denies SI, HI, AVH and pain at this time. The patient's skin was assessed and was found to exceptions to the normal daily limits. Patient has superficial self inflicted lacerations to his left thigh and left arm. Patient also has two tattoos on his right arm. No open wounds, cuts, or bruises noted. Routine safety checks conducted every 15 minutes. Patient verbally contracts for safety at this time. Patient rights and responsibilities have been explained and given to the patient to keep in her room. The patient will contact her support system on her own. The patient is now oriented to the unit and the unit rules.   06/03/24 1707  Psych Admission Type (Psych Patients Only)  Admission Status Voluntary  Psychosocial Assessment  Patient Complaints Insomnia;Self-harm thoughts;Self-harm behaviors;Anxiety;Depression  Eye Contact Fair  Facial Expression Flat  Affect Flat;Depressed  Speech Logical/coherent  Interaction Assertive  Motor Activity Slow  Appearance/Hygiene Unremarkable;In scrubs  Behavior Characteristics Calm;Cooperative  Mood Depressed  Aggressive Behavior  Effect No apparent injury  Thought Process  Coherency WDL  Content WDL  Delusions None reported or observed  Perception Hallucinations  Hallucination Auditory;Command;Visual;Tactile  Judgment Impaired  Confusion WDL  Danger to Self  Current suicidal ideation? Denies  Self-Injurious Behavior No self-injurious ideation or behavior indicators observed or expressed   Agreement Not to Harm Self Yes  Description of Agreement verbal  Danger to Others  Danger to Others None reported or observed

## 2024-06-03 NOTE — ED Notes (Signed)
 Patient is alert and oriented x 4 with no acute distress noted.  Patient was oriented to the unit, and received food and drink.  Patient currently expresses passive SI with no plan, however denies HI/AVH at this time.  Patient appears sad, depressed with flat affect.  Patient currently is awake and sitting on the side of the bed.  PRN Trazodone  50mg  given as needed for sleep.  Patient currently does not have any issues at this time.  Will continue Q 15 min safety checks for safety/behavior per facility protocol.

## 2024-06-03 NOTE — Group Note (Signed)
 Date:  06/03/2024 Time:  9:43 PM  Group Topic/Focus:  Wrap-Up Group:   The focus of this group is to help patients review their daily goal of treatment and discuss progress on daily workbooks.    Participation Level:  Did Not Attend    Jon Clayton 06/03/2024, 9:43 PM

## 2024-06-03 NOTE — Group Note (Signed)
 Recreation Therapy Group Note   Group Topic:Coping Skills  Group Date: 06/03/2024 Start Time: 1515 End Time: 1550 Facilitators: Celestia Jeoffrey FORBES ARTICE, CTRS Location: Craft Room  Group Description: Mind Map.  Patient was provided a blank template of a diagram with 32 blank boxes in a tiered system, branching from the center (similar to a bubble chart). LRT directed patients to label the middle of the diagram Coping Skills. LRT and patients then came up with 8 different coping skills as examples. Pt were directed to record their coping skills in the 2nd tier boxes closest to the center.  Patients would then share their coping skills with the group as LRT wrote them out. LRT gave a handout of 99 different coping skills at the end of group.   Goal Area(s) Addressed: Patients will be able to define coping skills. Patient will identify new coping skills.  Patient will increase communication.   Affect/Mood: N/A   Participation Level: Did not attend    Clinical Observations/Individualized Feedback: Patient did not attend.  Plan: Continue to engage patient in RT group sessions 2-3x/week.   Jeoffrey FORBES Celestia, LRT, CTRS 06/03/2024 5:04 PM

## 2024-06-03 NOTE — Plan of Care (Signed)

## 2024-06-03 NOTE — ED Notes (Signed)
 Pt awake sitting up eating breakfast. Does not appear to be in any acute distress. RR even and unlabored. Environment secured. Will continue to monitor for safety.

## 2024-06-04 DIAGNOSIS — F339 Major depressive disorder, recurrent, unspecified: Secondary | ICD-10-CM | POA: Diagnosis not present

## 2024-06-04 LAB — CBC
HCT: 44.4 % (ref 39.0–52.0)
Hemoglobin: 14.8 g/dL (ref 13.0–17.0)
MCH: 29.2 pg (ref 26.0–34.0)
MCHC: 33.3 g/dL (ref 30.0–36.0)
MCV: 87.6 fL (ref 80.0–100.0)
Platelets: 211 K/uL (ref 150–400)
RBC: 5.07 MIL/uL (ref 4.22–5.81)
RDW: 14.1 % (ref 11.5–15.5)
WBC: 6.9 K/uL (ref 4.0–10.5)
nRBC: 0 % (ref 0.0–0.2)

## 2024-06-04 LAB — COMPREHENSIVE METABOLIC PANEL WITH GFR
ALT: 56 U/L — ABNORMAL HIGH (ref 0–44)
AST: 36 U/L (ref 15–41)
Albumin: 4.3 g/dL (ref 3.5–5.0)
Alkaline Phosphatase: 70 U/L (ref 38–126)
Anion gap: 9 (ref 5–15)
BUN: 9 mg/dL (ref 6–20)
CO2: 29 mmol/L (ref 22–32)
Calcium: 8.9 mg/dL (ref 8.9–10.3)
Chloride: 102 mmol/L (ref 98–111)
Creatinine, Ser: 1.07 mg/dL (ref 0.61–1.24)
GFR, Estimated: >60 mL/min
Glucose, Bld: 96 mg/dL (ref 70–99)
Potassium: 4 mmol/L (ref 3.5–5.1)
Sodium: 140 mmol/L (ref 135–145)
Total Bilirubin: 0.5 mg/dL (ref 0.0–1.2)
Total Protein: 6.4 g/dL — ABNORMAL LOW (ref 6.5–8.1)

## 2024-06-04 MED ORDER — NICOTINE POLACRILEX 2 MG MT GUM
2.0000 mg | CHEWING_GUM | OROMUCOSAL | Status: DC | PRN
  Administered 2024-06-04 – 2024-06-12 (×3): 2 mg via ORAL
  Filled 2024-06-04: qty 1

## 2024-06-04 NOTE — Plan of Care (Signed)
   Problem: Education: Goal: Emotional status will improve Outcome: Progressing Goal: Mental status will improve Outcome: Progressing

## 2024-06-04 NOTE — Group Note (Signed)
 Recreation Therapy Group Note   Group Topic:Stress Management  Group Date: 06/04/2024 Start Time: 1525 End Time: 1600 Facilitators: Celestia Jeoffrey BRAVO, LRT, CTRS Location: Dayroom  Group Description: PMR (Progressive Muscle Relaxation). LRT educates patients on what PMR is and the benefits that come from it. Patients are asked to sit with their feet flat on the floor while sitting up and all the way back in their chair, if possible. LRT and pts follow a prompt through a speaker that requires you to tense and release different muscles in their body and focus on their breathing. During session, lights are off and soft music is being played. Pts are given a stress ball to use if needed.   Goal Area(s) Addressed:  Patients will be able to describe progressive muscle relaxation.  Patient will practice using relaxation technique. Patient will identify a new coping skill.  Patient will follow multistep directions to reduce anxiety and stress.   Affect/Mood: N/A   Participation Level: Did not attend    Clinical Observations/Individualized Feedback: Patient did not attend.   Plan: Continue to engage patient in RT group sessions 2-3x/week.   Jeoffrey BRAVO Celestia, LRT, CTRS 06/04/2024 4:41 PM

## 2024-06-04 NOTE — Group Note (Signed)
 Date:  06/04/2024 Time:  9:34 PM  Group Topic/Focus:  Wrap-Up Group:   The focus of this group is to help patients review their daily goal of treatment and discuss progress on daily workbooks.    Participation Level:  Did Not Attend  Leigh VEAR Pais 06/04/2024, 9:34 PM

## 2024-06-04 NOTE — [facility] Suicide Risk Assessment (Signed)
 [facility] INPATIENT:  Family/Significant Other Suicide Prevention Education  Suicide Prevention Education:  Patient Refusal for Family/Significant Other Suicide Prevention Education: The patient Jon Clayton has refused to provide written consent for family/significant other to be provided Family/Significant Other Suicide Prevention Education during admission and/or prior to discharge.  Physician notified.  SPE completed with pt, as pt refused to consent to family contact. SPI pamphlet provided to pt and pt was encouraged to share information with support network, ask questions, and talk about any concerns relating to SPE. Pt denies access to guns/firearms and verbalized understanding of information provided. Mobile Crisis information also provided to pt.  Nadara JONELLE Fam 06/04/2024, 4:30 PM

## 2024-06-04 NOTE — Progress Notes (Signed)
 Pt calm and pleasant during assessment denying SI/HI. Pt endorses A/H but stated they aren't telling him to hurt himself. Pt isolative to his room. Pt compliant with medication administration per MD orders. Pt given education, support, and encouragement to be active in his treatment plan. Pt being monitored Q 15 minutes for safety per unit protocol, remains safe on the unit

## 2024-06-04 NOTE — Plan of Care (Signed)
  Problem: Health Behavior/Discharge Planning: Goal: Compliance with treatment plan for underlying cause of condition will improve Outcome: Progressing   Problem: Physical Regulation: Goal: Ability to maintain clinical measurements within normal limits will improve Outcome: Progressing   Problem: Safety: Goal: Periods of time without injury will increase Outcome: Progressing   

## 2024-06-04 NOTE — Group Note (Signed)
 LCSW Group Therapy Note  Group Date: 06/04/2024 Start Time: 1300 End Time: 1400   Type of Therapy and Topic:  Group Therapy: Anger Cues and Responses  Participation Level:  Did Not Attend   Description of Group:   In this group, patients learned how to recognize the physical, cognitive, emotional, and behavioral responses they have to anger-provoking situations.  They identified a recent time they became angry and how they reacted.  They analyzed how their reaction was possibly beneficial and how it was possibly unhelpful.  The group discussed a variety of healthier coping skills that could help with such a situation in the future.  Focus was placed on how helpful it is to recognize the underlying emotions to our anger, because working on those can lead to a more permanent solution as well as our ability to focus on the important rather than the urgent.  Therapeutic Goals: Patients will remember their last incident of anger and how they felt emotionally and physically, what their thoughts were at the time, and how they behaved. Patients will identify how their behavior at that time worked for them, as well as how it worked against them. Patients will explore possible new behaviors to use in future anger situations. Patients will learn that anger itself is normal and cannot be eliminated, and that healthier reactions can assist with resolving conflict rather than worsening situations.  Summary of Patient Progress:   X Therapeutic Modalities:   Cognitive Behavioral Therapy    Sherryle JINNY Margo, LCSW 06/04/2024  3:18 PM
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
## 2024-06-04 NOTE — Group Note (Signed)
 Recreation Therapy Group Note   Group Topic:Healthy Support Systems  Group Date: 06/04/2024 Start Time: 1010 End Time: 1050 Facilitators: Celestia Jeoffrey BRAVO, LRT, CTRS Location: Craft Room  Group Description: Straw Bridge. In groups or individually, patients were given 10 plastic drinking straws and an equal length of masking tape. Using the materials provided, patients were instructed to build a free-standing bridge-like structure to suspend an everyday item (ex: deck of cards) off the floor or table surface. All materials were required to be used in secondary school teacher. LRT facilitated post-activity discussion reviewing the importance of having strong and healthy support systems in our lives. LRT discussed how the people in our lives serve as the tape and the deck of cards we placed on top of our straw structure are the stressors we face in daily life. LRT and pts discussed what happens in our life when things get too heavy for us , and we don't have strong supports outside of the hospital. Pt shared 2 of their healthy supports in their life aloud in the group.   Goal Area(s) Addressed:  Patient will identify 2 healthy supports in their life. Patient will identify skills to successfully complete activity. Patient will identify correlation of this activity to life post-discharge.  Patient will build on frustration tolerance skills. Patient will increase team building and communication skills.   Affect/Mood: N/A   Participation Level: Did not attend    Clinical Observations/Individualized Feedback: Patient did not attend.  Plan: Continue to engage patient in RT group sessions 2-3x/week.   Jeoffrey BRAVO Celestia, LRT, CTRS 06/04/2024 11:00 AM
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
## 2024-06-04 NOTE — Progress Notes (Signed)
" °   06/04/24 1100  Psych Admission Type (Psych Patients Only)  Admission Status Voluntary  Psychosocial Assessment  Patient Complaints Depression  Eye Contact Fair  Facial Expression Flat  Affect Depressed  Speech Logical/coherent  Interaction Assertive  Motor Activity Slow  Appearance/Hygiene Unremarkable  Behavior Characteristics Cooperative  Mood Depressed  Aggressive Behavior  Effect No apparent injury  Thought Process  Coherency WDL  Content WDL  Delusions None reported or observed  Perception Hallucinations  Hallucination Auditory;Visual  Judgment Impaired  Confusion WDL  Danger to Self  Current suicidal ideation? Denies  Agreement Not to Harm Self Yes  Description of Agreement verbal  Danger to Others  Danger to Others None reported or observed   Had Vistaril  x 1 for anxiety. Visible in the milieu. "

## 2024-06-05 DIAGNOSIS — F339 Major depressive disorder, recurrent, unspecified: Secondary | ICD-10-CM

## 2024-06-05 NOTE — Plan of Care (Signed)
   Problem: Education: Goal: Emotional status will improve Outcome: Progressing Goal: Mental status will improve Outcome: Progressing

## 2024-06-05 NOTE — BHH Counselor (Signed)
 Referral was sent to [facility].  Nadara SAUNDERS. Chaim, MSW, LCSW, LCAS 06/05/2024 3:48 PM

## 2024-06-05 NOTE — Group Note (Signed)
 Date:  06/05/2024 Time:  9:02 PM  Group Topic/Focus:  Identifying Needs:   The focus of this group is to help patients identify their personal needs that have been historically problematic and identify healthy behaviors to address their needs. Managing Feelings:   The focus of this group is to identify what feelings patients have difficulty handling and develop a plan to handle them in a healthier way upon discharge.    Pt did not attend.  Saramarie Stinger L 06/05/2024, 9:02 PM

## 2024-06-05 NOTE — Group Note (Signed)
 [facility] LCSW Group Therapy Note   Group Date: 06/05/2024 Start Time: 1230 End Time: 1430   Type of Therapy/Topic:  Group Therapy:  Balance in Life  Participation Level:  Did Not Attend   Description of Group:    This group will address the concept of balance and how it feels and looks when one is unbalanced. Patients will be encouraged to process areas in their lives that are out of balance, and identify reasons for remaining unbalanced. Facilitators will guide patients utilizing problem- solving interventions to address and correct the stressor making their life unbalanced. Understanding and applying boundaries will be explored and addressed for obtaining  and maintaining a balanced life. Patients will be encouraged to explore ways to assertively make their unbalanced needs known to significant others in their lives, using other group members and facilitator for support and feedback.  Therapeutic Goals: Patient will identify two or more emotions or situations they have that consume much of in their lives. Patient will identify signs/triggers that life has become out of balance:  Patient will identify two ways to set boundaries in order to achieve balance in their lives:  Patient will demonstrate ability to communicate their needs through discussion and/or role plays  Summary of Patient Progress:    Patient did not attend.     Therapeutic Modalities:   Cognitive Behavioral Therapy Solution-Focused Therapy Assertiveness Training   Alveta CHRISTELLA Kerns, LCSW

## 2024-06-05 NOTE — Plan of Care (Addendum)
 Jon Clayton & O X4. Endorses passive SI I did had thoughts to hurt myself earlier but I'm fine now. Reports +AVH and tactile hallucinations of [name], I can feel him touching my shoulder. I always see and hears him since I was 28 y/o. States he slept poorly last night related to night mares I slept through it though. PRN Tylenol  650 mg PO given for  generalized aches and pain 6/10 with relief when reassessed at 0940. Jon noted to be defensive when encouraged to attend scheduled unit groups I wasn't told that, I'm not going to argue with you. I don't need those groups, I did them at [facility]. I wasn't told to attend groups. Tolerates meals, fluids and medications well. Support, encouragement and reassurance offered.   Problem: Activity: Goal: Interest or engagement in activities will improve Outcome: Not Progressing Goal: Sleeping patterns will improve Outcome: Not Progressing

## 2024-06-05 NOTE — Group Note (Signed)
 Date:  06/05/2024 Time:  10:30 AM  Group Topic/Focus:  Goals Group:   The focus of this group is to help patients establish daily goals to achieve during treatment and discuss how the patient can incorporate goal setting into their daily lives to aide in recovery.    Participation Level:  Did Not Attend   Deitra Clap [facility] 06/05/2024, 10:30 AM

## 2024-06-05 NOTE — Progress Notes (Signed)
 [facility] MD Progress Note  06/05/2024 2:50 PM Jon Clayton  MRN:  [id number]  Patient is admitted to the inpatient psychiatric unit from behavioral health urgent care with complaints of suicidal ideation, housing instability, and auditory and visual hallucinations. Prior to reaching out for help, patient was actively contemplating suicide with a plan to get intoxicated and cut himself. This is not his first suicide attempt, with his most recent attempt occurring approximately two years ago. He continues to endorse suicidal ideations but is able to contract for safety while on the unit. When asked about homicidal ideation, patient stated I do not have any plans to kill anyone but I would like to hurt the person that still has access to my bank account. He denies access to firearms and reports no pending court dates or legal charges. Subjective:  Chart reviewed, case discussed in multidisciplinary meeting, patient seen during rounds.   Patient is noted to be resting in bed.  He reports ongoing depression, feeling hopeless and worthless.  He reports ongoing passive SI with no specific plan and denies HI.  He reports anhedonia given his psychosocial stressors of being homeless and not having enough support in the community.  He reports ongoing anxiety and panic attacks.  Per nursing patient is isolated to his room patient is educated about attending the groups as part of the treatment plan but he is declining to participate in the groups  Past Psychiatric History: see h&P Family History: History reviewed. No pertinent family history. Social History:  Social History   Substance and Sexual Activity  Alcohol Use Not Currently   Comment: reports periodic alcohol use     Social History   Substance and Sexual Activity  Drug Use Not Currently   Frequency: 1.0 times per week   Types: Marijuana, Amphetamines   Comment: 1/3 gm daily    Social History   Socioeconomic History   Marital status: Single     Spouse name: Not on file   Number of children: Not on file   Years of education: Not on file   Highest education level: 8th grade  Occupational History   Not on file  Tobacco Use   Smoking status: Every Day    Current packs/day: 1.00    Average packs/day: 1 pack/day for 11.0 years (11.0 ttl pk-yrs)    Types: Cigarettes   Smokeless tobacco: Never  Vaping Use   Vaping status: Never Used  Substance and Sexual Activity   Alcohol use: Not Currently    Comment: reports periodic alcohol use   Drug use: Not Currently    Frequency: 1.0 times per week    Types: Marijuana, Amphetamines    Comment: 1/3 gm daily   Sexual activity: Not Currently  Other Topics Concern   Not on file  Social History Narrative   Not on file   Social Drivers of Health   Tobacco Use: High Risk (06/03/2024)   Patient History    Smoking Tobacco Use: Every Day    Smokeless Tobacco Use: Never    Passive Exposure: Not on file  Financial Resource Strain: Not at Risk (10/27/2023)   Received from [employer]    How hard is it for you to pay for the very basics like food, housing, heating, medical care, and medications?: 1  Food Insecurity: Food Insecurity Present (06/03/2024)   Epic    Worried About Programme Researcher, Broadcasting/film/video in the Last Year: Often true    [employer]  in the Last Year: Often true  Transportation Needs: Unmet Transportation Needs (06/03/2024)   Epic    Lack of Transportation (Medical): Yes    Lack of Transportation (Non-Medical): Yes  Physical Activity: At Risk (10/27/2023)   Received from [facility]   Physical Activity    On average, how many minutes do you engage in exercise at this level?: 2  Stress: Not at Risk (10/27/2023)   Received from [facility]   Stress    Do you feel these kinds of stress these days?: 1  Social Connections: At Risk (10/27/2023)   Received from [facility]   Social Connections    How often do you see or talk to people that you care about and feel close to? (For  example: talking to friends on phone, visiting friends or family, going to church or club meetings): 2  Depression (PHQ2-9): Medium Risk (09/21/2022)   Depression (PHQ2-9)    PHQ-2 Score: 8  Alcohol Screen: Low Risk (06/03/2024)   Alcohol Screen    Last Alcohol Screening Score (AUDIT): 4  Housing: High Risk (06/03/2024)   Epic    Unable to Pay for Housing in the Last Year: Yes    Number of Times Moved in the Last Year: 0    Homeless in the Last Year: Yes  Utilities: At Risk (06/03/2024)   Epic    Threatened with loss of utilities: Yes  Health Literacy: Not on file   Past Medical History:  Past Medical History:  Diagnosis Date   Depression    Psychosis (HCC)    PTSD (post-traumatic stress disorder)     Past Surgical History:  Procedure Laterality Date   NASAL SINUS SURGERY Bilateral    TONSILLECTOMY      Current Medications: Current Facility-Administered Medications  Medication Dose Route Frequency Provider Last Rate Last Admin   acetaminophen  (TYLENOL ) tablet 650 mg  650 mg Oral Q6H PRN Dasie Ellouise CROME, FNP   650 mg at 06/05/24 0846   albuterol  (VENTOLIN  HFA) 108 (90 Base) MCG/ACT inhaler 2 puff  2 puff Inhalation Q6H PRN Dasie Ellouise CROME, FNP       alum & mag hydroxide-simeth (MAALOX/MYLANTA) 200-200-20 MG/5ML suspension 30 mL  30 mL Oral Q4H PRN Allen, Tina L, FNP       haloperidol  (HALDOL ) tablet 5 mg  5 mg Oral TID PRN Dasie Ellouise CROME, FNP       And   diphenhydrAMINE  (BENADRYL ) capsule 50 mg  50 mg Oral TID PRN Dasie Ellouise CROME, FNP       haloperidol  lactate (HALDOL ) injection 5 mg  5 mg Intramuscular TID PRN Dasie Ellouise CROME, FNP       And   diphenhydrAMINE  (BENADRYL ) injection 50 mg  50 mg Intramuscular TID PRN Dasie Ellouise CROME, FNP       And   LORazepam  (ATIVAN ) injection 2 mg  2 mg Intramuscular TID PRN Dasie Ellouise CROME, FNP       haloperidol  lactate (HALDOL ) injection 10 mg  10 mg Intramuscular TID PRN Dasie Ellouise CROME, FNP       And   diphenhydrAMINE  (BENADRYL ) injection 50 mg  50 mg  Intramuscular TID PRN Dasie Ellouise CROME, FNP       And   LORazepam  (ATIVAN ) injection 2 mg  2 mg Intramuscular TID PRN Allen, Tina L, FNP       hydrOXYzine  (ATARAX ) tablet 25 mg  25 mg Oral TID PRN Dasie Ellouise CROME, FNP   25 mg at 06/05/24 [phone]  magnesium  hydroxide (MILK OF MAGNESIA) suspension 30 mL  30 mL Oral Daily PRN Dasie Ellouise CROME, FNP   30 mL at 06/05/24 9152   nicotine  polacrilex (NICORETTE ) gum 2 mg  2 mg Oral PRN Shyhiem Beeney, MD   2 mg at 06/04/24 1416   OLANZapine  (ZYPREXA ) tablet 5 mg  5 mg Oral BID Dasie Ellouise CROME, FNP   5 mg at 06/05/24 9153   ondansetron  (ZOFRAN -ODT) disintegrating tablet 4 mg  4 mg Oral Q8H PRN Dasie Ellouise CROME, FNP       prazosin  (MINIPRESS ) capsule 2 mg  2 mg Oral QHS Dasie Ellouise CROME, FNP   2 mg at 06/04/24 2117   traZODone  (DESYREL ) tablet 50 mg  50 mg Oral QHS PRN Dasie Ellouise CROME, FNP   50 mg at 06/04/24 2117    Lab Results:  Results for orders placed or performed during the hospital encounter of 06/03/24 (from the past 48 hours)  CBC     Status: None   Collection Time: 06/04/24  6:44 AM  Result Value Ref Range   WBC 6.9 4.0 - 10.5 K/uL   RBC 5.07 4.22 - 5.81 MIL/uL   Hemoglobin 14.8 13.0 - 17.0 g/dL   HCT 55.5 60.9 - 47.9 %   MCV 87.6 80.0 - 100.0 fL   MCH 29.2 26.0 - 34.0 pg   MCHC 33.3 30.0 - 36.0 g/dL   RDW 85.8 88.4 - 84.4 %   Platelets 211 150 - 400 K/uL   nRBC 0.0 0.0 - 0.2 %    Comment: Performed at [facility] [address] [place], [place] [address]  Comprehensive metabolic panel     Status: Abnormal   Collection Time: 06/04/24  6:44 AM  Result Value Ref Range   Sodium 140 135 - 145 mmol/L   Potassium 4.0 3.5 - 5.1 mmol/L   Chloride 102 98 - 111 mmol/L   CO2 29 22 - 32 mmol/L   Glucose, Bld 96 70 - 99 mg/dL    Comment: Glucose reference range applies only to samples taken after fasting for at least 8 hours.   BUN 9 6 - 20 mg/dL   Creatinine, Ser 8.92 0.61 - 1.24 mg/dL   Calcium 8.9 8.9 - 89.6 mg/dL   Total Protein 6.4 (L) 6.5 -  8.1 g/dL   Albumin 4.3 3.5 - 5.0 g/dL   AST 36 15 - 41 U/L   ALT 56 (H) 0 - 44 U/L   Alkaline Phosphatase 70 38 - 126 U/L   Total Bilirubin 0.5 0.0 - 1.2 mg/dL   GFR, Estimated >39 >39 mL/min    Comment: (NOTE) Calculated using the CKD-EPI Creatinine Equation (2021)    Anion gap 9 5 - 15    Comment: Performed at [facility], [address] [place], [place] [address]    Blood Alcohol level:  Lab Results  Component Value Date   [facility] <15 06/03/2024   ETH <10 09/16/2022    Metabolic Disorder Labs: Lab Results  Component Value Date   HGBA1C 4.9 07/04/2022   MPG 93.93 07/04/2022   MPG 93.93 11/10/2021   No results found for: PROLACTIN Lab Results  Component Value Date   CHOL 150 07/04/2022   TRIG 106 07/04/2022   HDL 50 07/04/2022   CHOLHDL 3.0 07/04/2022   VLDL 21 07/04/2022   LDLCALC 79 07/04/2022   LDLCALC 55 11/10/2021    Physical Findings: AIMS:  , ,  ,  ,  CIWA:    COWS:      Psychiatric Specialty Exam:  Presentation  General Appearance:  Appropriate for Environment  Eye Contact: Fair  Speech: Clear and Coherent  Speech Volume: Normal    Mood and Affect  Mood: Depressed  Affect: Congruent   Thought Process  Thought Processes: Coherent; Linear; Goal Directed  Orientation:Full (Time, Place and Person)  Thought Content:Logical  Hallucinations:Hallucinations: Auditory; Visual  Ideas of Reference:None  Suicidal Thoughts:Suicidal Thoughts: Yes, Passive  Homicidal Thoughts:Homicidal Thoughts: No   Sensorium  Memory: Immediate Fair; Recent Fair; Remote Fair  Judgment: Impaired  Insight: Lacking   Executive Functions  Concentration: Fair  Attention Span: Fair  Recall: [employer] of Knowledge: Fair  Language: Fair   Psychomotor Activity  Psychomotor Activity: Psychomotor Activity: Normal  Musculoskeletal: Strength & Muscle Tone: within normal limits Gait & Station: normal Assets   Assets: Manufacturing Systems Engineer; Desire for Improvement    Physical Exam: Physical Exam ROS Blood pressure 125/83, pulse 63, temperature 97.8 F (36.6 C), temperature source Oral, resp. rate 16, height 5' 7 (1.702 m), weight 97.5 kg, SpO2 98%. Body mass index is 33.67 kg/m.  Diagnosis: Principal Problem:   MDD (major depressive disorder), recurrent episode, with atypical features   Treatment Plan Summary:  Safety and Monitoring:             -- Voluntary admission to inpatient psychiatric unit for safety, stabilization and treatment             -- Daily contact with patient to assess and evaluate symptoms and progress in treatment             -- Patient's case to be discussed in multi-disciplinary team meeting             -- Observation Level: q15 minute checks             -- Vital signs:  q12 hours             -- Precautions: suicide, elopement, and assault   2. Psychiatric Diagnoses and Treatment:  Patient was reinitiated on Zyprexa  5 mg two times daily and prazosin  2 mg nightly. The plan is to titrate Zyprexa  back to his previously tolerated dosage to maximize efficacy while continuing to monitor for side effects and tolerability. Patient verbalized understanding and agreement with the treatment plan. He denies any noted side effects at this current time.                 -- The risks/benefits/side-effects/alternatives to this medication were discussed in detail with the patient and time was given for questions. The patient consents to medication trial.                -- Metabolic profile and EKG monitoring obtained while on an atypical antipsychotic (BMI: Lipid Panel: HbgA1c: QTc:)              -- Encouraged patient to participate in unit milieu and in scheduled group therapies                            3. Medical Issues Being Addressed:  -No acute concerns at this time      4. Discharge Planning:   -- Social work and case management to assist with discharge planning and  identification of hospital follow-up needs prior to discharge  -- Estimated LOS: 3-4 days  Allyn Foil, MD 06/05/2024, 2:50 PM

## 2024-06-05 NOTE — BH IP Treatment Plan (Signed)
 Interdisciplinary Treatment and Diagnostic Plan Update  06/05/2024 Time of Session: 10:11 Cris Talavera MRN: [id number]  Principal Diagnosis: MDD (major depressive disorder), recurrent episode, with atypical features  Secondary Diagnoses: Principal Problem:   MDD (major depressive disorder), recurrent episode, with atypical features   Current Medications:  Current Facility-Administered Medications  Medication Dose Route Frequency Provider Last Rate Last Admin   acetaminophen  (TYLENOL ) tablet 650 mg  650 mg Oral Q6H PRN Dasie Ellouise CROME, FNP   650 mg at 06/05/24 0846   albuterol  (VENTOLIN  HFA) 108 (90 Base) MCG/ACT inhaler 2 puff  2 puff Inhalation Q6H PRN Dasie Ellouise CROME, FNP       alum & mag hydroxide-simeth (MAALOX/MYLANTA) 200-200-20 MG/5ML suspension 30 mL  30 mL Oral Q4H PRN Allen, Tina L, FNP       haloperidol  (HALDOL ) tablet 5 mg  5 mg Oral TID PRN Dasie Ellouise CROME, FNP       And   diphenhydrAMINE  (BENADRYL ) capsule 50 mg  50 mg Oral TID PRN Allen, Tina L, FNP       haloperidol  lactate (HALDOL ) injection 5 mg  5 mg Intramuscular TID PRN Dasie Ellouise CROME, FNP       And   diphenhydrAMINE  (BENADRYL ) injection 50 mg  50 mg Intramuscular TID PRN Dasie Ellouise CROME, FNP       And   LORazepam  (ATIVAN ) injection 2 mg  2 mg Intramuscular TID PRN Dasie Ellouise CROME, FNP       haloperidol  lactate (HALDOL ) injection 10 mg  10 mg Intramuscular TID PRN Dasie Ellouise CROME, FNP       And   diphenhydrAMINE  (BENADRYL ) injection 50 mg  50 mg Intramuscular TID PRN Dasie Ellouise CROME, FNP       And   LORazepam  (ATIVAN ) injection 2 mg  2 mg Intramuscular TID PRN Dasie Ellouise CROME, FNP       hydrOXYzine  (ATARAX ) tablet 25 mg  25 mg Oral TID PRN Dasie Ellouise CROME, FNP   25 mg at 06/05/24 0847   magnesium  hydroxide (MILK OF MAGNESIA) suspension 30 mL  30 mL Oral Daily PRN Dasie Ellouise CROME, FNP   30 mL at 06/05/24 9152   nicotine  polacrilex (NICORETTE ) gum 2 mg  2 mg Oral PRN Jadapalle, Sree, MD   2 mg at 06/04/24 1416   OLANZapine  (ZYPREXA )  tablet 5 mg  5 mg Oral BID Dasie Ellouise CROME, FNP   5 mg at 06/05/24 9153   ondansetron  (ZOFRAN -ODT) disintegrating tablet 4 mg  4 mg Oral Q8H PRN Dasie Ellouise CROME, FNP       prazosin  (MINIPRESS ) capsule 2 mg  2 mg Oral QHS Dasie Ellouise CROME, FNP   2 mg at 06/04/24 2117   traZODone  (DESYREL ) tablet 50 mg  50 mg Oral QHS PRN Allen, Tina L, FNP   50 mg at 06/04/24 2117   PTA Medications: Medications Prior to Admission  Medication Sig Dispense Refill Last Dose/Taking   albuterol  (VENTOLIN  HFA) 108 (90 Base) MCG/ACT inhaler Inhale 2 puffs into the lungs every 6 (six) hours as needed for wheezing or shortness of breath. 6.7 g 0    OLANZapine  (ZYPREXA ) 5 MG tablet Take 1 PO QAM and 3 PO QHS (Patient not taking: Reported on 06/03/2024) 120 tablet 1    prazosin  (MINIPRESS ) 2 MG capsule Take 1 capsule (2 mg total) by mouth at bedtime. (Patient not taking: Reported on 06/03/2024) 30 capsule 1     Patient Stressors: Financial difficulties   Health problems  Medication change or noncompliance   Occupational concerns   Substance abuse   Traumatic event    Patient Strengths: Ability for insight  Average or above average intelligence  Communication skills  General fund of knowledge  Motivation for treatment/growth   Treatment Modalities: Medication Management, Group therapy, Case management,  1 to 1 session with clinician, Psychoeducation, Recreational therapy.   Physician Treatment Plan for Primary Diagnosis: MDD (major depressive disorder), recurrent episode, with atypical features Long Term Goal(s): Improvement in symptoms so as ready for discharge   Short Term Goals: Ability to identify changes in lifestyle to reduce recurrence of condition will improve Ability to verbalize feelings will improve Ability to disclose and discuss suicidal ideas Ability to demonstrate self-control will improve Ability to identify and develop effective coping behaviors will improve Ability to maintain clinical  measurements within normal limits will improve Compliance with prescribed medications will improve Ability to identify triggers associated with substance abuse/mental health issues will improve  Medication Management: Evaluate patient's response, side effects, and tolerance of medication regimen.  Therapeutic Interventions: 1 to 1 sessions, Unit Group sessions and Medication administration.  Evaluation of Outcomes: Not Met  Physician Treatment Plan for Secondary Diagnosis: Principal Problem:   MDD (major depressive disorder), recurrent episode, with atypical features  Long Term Goal(s): Improvement in symptoms so as ready for discharge   Short Term Goals: Ability to identify changes in lifestyle to reduce recurrence of condition will improve Ability to verbalize feelings will improve Ability to disclose and discuss suicidal ideas Ability to demonstrate self-control will improve Ability to identify and develop effective coping behaviors will improve Ability to maintain clinical measurements within normal limits will improve Compliance with prescribed medications will improve Ability to identify triggers associated with substance abuse/mental health issues will improve     Medication Management: Evaluate patient's response, side effects, and tolerance of medication regimen.  Therapeutic Interventions: 1 to 1 sessions, Unit Group sessions and Medication administration.  Evaluation of Outcomes: Not Met   RN Treatment Plan for Primary Diagnosis: MDD (major depressive disorder), recurrent episode, with atypical features Long Term Goal(s): Knowledge of disease and therapeutic regimen to maintain health will improve  Short Term Goals: Ability to remain free from injury will improve, Ability to verbalize frustration and anger appropriately will improve, Ability to demonstrate self-control, Ability to participate in decision making will improve, Ability to verbalize feelings will improve,  Ability to disclose and discuss suicidal ideas, Ability to identify and develop effective coping behaviors will improve, and Compliance with prescribed medications will improve  Medication Management: RN will administer medications as ordered by provider, will assess and evaluate patient's response and provide education to patient for prescribed medication. RN will report any adverse and/or side effects to prescribing provider.  Therapeutic Interventions: 1 on 1 counseling sessions, Psychoeducation, Medication administration, Evaluate responses to treatment, Monitor vital signs and CBGs as ordered, Perform/monitor CIWA, COWS, AIMS and Fall Risk screenings as ordered, Perform wound care treatments as ordered.  Evaluation of Outcomes: Not Met   LCSW Treatment Plan for Primary Diagnosis: MDD (major depressive disorder), recurrent episode, with atypical features Long Term Goal(s): Safe transition to appropriate next level of care at discharge, Engage patient in therapeutic group addressing interpersonal concerns.  Short Term Goals: Engage patient in aftercare planning with referrals and resources, Increase social support, Increase ability to appropriately verbalize feelings, Increase emotional regulation, Facilitate acceptance of mental health diagnosis and concerns, Facilitate patient progression through stages of change regarding substance use diagnoses and concerns, Identify  triggers associated with mental health/substance abuse issues, and Increase skills for wellness and recovery  Therapeutic Interventions: Assess for all discharge needs, 1 to 1 time with Social worker, Explore available resources and support systems, Assess for adequacy in community support network, Educate family and significant other(s) on suicide prevention, Complete Psychosocial Assessment, Interpersonal group therapy.  Evaluation of Outcomes: Not Met   Progress in Treatment: Attending groups: No. Participating in groups:  No. Taking medication as prescribed: Yes. Toleration medication: Yes. Family/Significant other contact made: No, will contact:  if given permission.  Patient understands diagnosis: Yes. Discussing patient identified problems/goals with staff: Yes. Medical problems stabilized or resolved: Yes. Denies suicidal/homicidal ideation: Yes. Issues/concerns per patient self-inventory: No. Other: none.  New problem(s) identified: No, Describe:  none identified.  New Short Term/Long Term Goal(s): elimination of symptoms of psychosis, medication management for mood stabilization; elimination of SI thoughts; development of comprehensive mental wellness/sobriety plan.  Patient Goals:  Get back on my medicine and I'd like to go to [facility] or [facility] from here.   Discharge Plan or Barriers: CSW will assist pt with development of an appropriate aftercare/discharge plan.   Reason for Continuation of Hospitalization: Depression Hallucinations Medication stabilization Suicidal ideation  Estimated Length of Stay: 1-7 days  Last 3 [place] Suicide Severity Risk Score: Flowsheet Row Admission (Current) from 06/03/2024 in [facility] INPATIENT BEHAVIORAL MEDICINE Most recent reading at 06/03/2024  4:12 PM ED from 06/03/2024 in [facility] Most recent reading at 06/03/2024  3:09 AM ED from 03/28/2023 in [facility] Emergency Department at [facility] Most recent reading at 03/28/2023  2:18 PM  C-SSRS RISK CATEGORY High Risk High Risk No Risk    Last PHQ 2/9 Scores:    09/21/2022    8:28 AM 09/20/2022   10:21 PM 09/19/2022    5:57 PM  Depression screen PHQ 2/9  Decreased Interest 2 2 3   Down, Depressed, Hopeless 2 2 2   PHQ - 2 Score 4 4 5   Altered sleeping 2 2 3   Tired, decreased energy 2 2 2   Change in appetite   3  Feeling bad or failure about yourself   3 3  Trouble concentrating  2 2  Moving slowly or fidgety/restless  2 3  Suicidal thoughts   2 3  PHQ-9 Score 8  17  24    Difficult doing work/chores   Very difficult     Data saved with a previous flowsheet row definition    Scribe for Treatment Team: Nadara JONELLE Fam, LCSW 06/05/2024 1:03 PM

## 2024-06-05 NOTE — Group Note (Signed)
 Recreation Therapy Group Note   Group Topic:Other  Group Date: 06/05/2024 Start Time: 1010 End Time: 1110 Facilitators: Celestia Jeoffrey BRAVO, LRT, CTRS Location: Dayroom  Group Description: Patients were given ornaments to either paint or color. Patients were encouraged to make more than one and take it home with them when discharged. LRT and patients discussed how creative arts can be a way to express emotions and use as a coping skill.   Goal Area(s) Addressed:  Patient will identify a new coping skill. Patient will engage in an expressive art. Patient will communicate with LRT and peers.    Affect/Mood: N/A   Participation Level: Did not attend    Clinical Observations/Individualized Feedback: Patient did not attend.  Plan: Continue to engage patient in RT group sessions 2-3x/week.   Jeoffrey BRAVO Celestia, LRT, CTRS 06/05/2024 11:42 AM

## 2024-06-06 DIAGNOSIS — F339 Major depressive disorder, recurrent, unspecified: Secondary | ICD-10-CM | POA: Diagnosis not present

## 2024-06-06 NOTE — Group Note (Signed)
 [facility] LCSW Group Therapy Note   Group Date: 06/06/2024 Start Time: 1300 End Time: 1345   Type of Therapy/Topic:  Group Therapy:  Balance in Life  Participation Level:  Did Not Attend   Description of Group:    This group will address the concept of balance and how it feels and looks when one is unbalanced. Patients will be encouraged to process areas in their lives that are out of balance, and identify reasons for remaining unbalanced. Facilitators will guide patients utilizing problem- solving interventions to address and correct the stressor making their life unbalanced. Understanding and applying boundaries will be explored and addressed for obtaining  and maintaining a balanced life. Patients will be encouraged to explore ways to assertively make their unbalanced needs known to significant others in their lives, using other group members and facilitator for support and feedback.  Therapeutic Goals: Patient will identify two or more emotions or situations they have that consume much of in their lives. Patient will identify signs/triggers that life has become out of balance:  Patient will identify two ways to set boundaries in order to achieve balance in their lives:  Patient will demonstrate ability to communicate their needs through discussion and/or role plays  Summary of Patient Progress: Patient did not attend group.   Therapeutic Modalities:   Cognitive Behavioral Therapy Solution-Focused Therapy Assertiveness Training   Nadara JONELLE Fam, LCSW

## 2024-06-06 NOTE — Group Note (Signed)
 Date:  06/06/2024 Time:  4:23 PM  Group Topic/Focus:  Rediscovering Joy:   The focus of this group is to explore various ways to relieve stress in a positive manner.    Participation Level:  Did Not Attend  Participation Quality:    Affect:    Cognitive:    Insight:   Engagement in Group:    Modes of Intervention:    Additional Comments:    Torrion Witter Byrd 06/06/2024, 4:23 PM

## 2024-06-06 NOTE — Progress Notes (Signed)
 [facility] MD Progress Note  06/06/2024 3:11 PM Jon Clayton  MRN:  [id number]  Patient is admitted to the inpatient psychiatric unit from behavioral health urgent care with complaints of suicidal ideation, housing instability, and auditory and visual hallucinations. Prior to reaching out for help, patient was actively contemplating suicide with a plan to get intoxicated and cut himself. This is not his first suicide attempt, with his most recent attempt occurring approximately two years ago. He continues to endorse suicidal ideations but is able to contract for safety while on the unit. When asked about homicidal ideation, patient stated I do not have any plans to kill anyone but I would like to hurt the person that still has access to my bank account. He denies access to firearms and reports no pending court dates or legal charges. Subjective:  Chart reviewed, case discussed in multidisciplinary meeting, patient seen during rounds.   Patient is noted to be resting in bed.  He offers no complaints.  He reports that he was not feeling well and was resting instead of attending the groups.  He reports ongoing depression rating it as 8 out of 10, 10 being the worst and anxiety as 8 out of 10.  He reports ongoing auditory hallucinations of hearing voices.  He also endorses SI today morning.  Patient is encouraged to participate in groups.  Patient requested the provider to get him on Thorazine stating that at [facility] he was receiving Thorazine and has mood lability.  Patient also requested for Seroquel .  Patient and provider decided on Seroquel  as a mood stabilizer.  Past Psychiatric History: see h&P Family History: History reviewed. No pertinent family history. Social History:  Social History   Substance and Sexual Activity  Alcohol Use Not Currently   Comment: reports periodic alcohol use     Social History   Substance and Sexual Activity  Drug Use Not Currently   Frequency: 1.0 times per week    Types: Marijuana, Amphetamines   Comment: 1/3 gm daily    Social History   Socioeconomic History   Marital status: Single    Spouse name: Not on file   Number of children: Not on file   Years of education: Not on file   Highest education level: 8th grade  Occupational History   Not on file  Tobacco Use   Smoking status: Every Day    Current packs/day: 1.00    Average packs/day: 1 pack/day for 11.0 years (11.0 ttl pk-yrs)    Types: Cigarettes   Smokeless tobacco: Never  Vaping Use   Vaping status: Never Used  Substance and Sexual Activity   Alcohol use: Not Currently    Comment: reports periodic alcohol use   Drug use: Not Currently    Frequency: 1.0 times per week    Types: Marijuana, Amphetamines    Comment: 1/3 gm daily   Sexual activity: Not Currently  Other Topics Concern   Not on file  Social History Narrative   Not on file   Social Drivers of Health   Tobacco Use: High Risk (06/03/2024)   Patient History    Smoking Tobacco Use: Every Day    Smokeless Tobacco Use: Never    Passive Exposure: Not on file  Financial Resource Strain: Not at Risk (10/27/2023)   Received from [employer]    How hard is it for you to pay for the very basics like food, housing, heating, medical care, and medications?: 1  Food Insecurity:  Food Insecurity Present (06/03/2024)   Epic    Worried About Programme Researcher, Broadcasting/film/video in the Last Year: Often true    Barista in the Last Year: Often true  Transportation Needs: Unmet Transportation Needs (06/03/2024)   Epic    Lack of Transportation (Medical): Yes    Lack of Transportation (Non-Medical): Yes  Physical Activity: At Risk (10/27/2023)   Received from [facility]   Physical Activity    On average, how many minutes do you engage in exercise at this level?: 2  Stress: Not at Risk (10/27/2023)   Received from [facility]   Stress    Do you feel these kinds of stress these days?: 1  Social Connections: At Risk (10/27/2023)    Received from [facility]   Social Connections    How often do you see or talk to people that you care about and feel close to? (For example: talking to friends on phone, visiting friends or family, going to church or club meetings): 2  Depression (PHQ2-9): Medium Risk (09/21/2022)   Depression (PHQ2-9)    PHQ-2 Score: 8  Alcohol Screen: Low Risk (06/03/2024)   Alcohol Screen    Last Alcohol Screening Score (AUDIT): 4  Housing: High Risk (06/03/2024)   Epic    Unable to Pay for Housing in the Last Year: Yes    Number of Times Moved in the Last Year: 0    Homeless in the Last Year: Yes  Utilities: At Risk (06/03/2024)   Epic    Threatened with loss of utilities: Yes  Health Literacy: Not on file   Past Medical History:  Past Medical History:  Diagnosis Date   Depression    Psychosis (HCC)    PTSD (post-traumatic stress disorder)     Past Surgical History:  Procedure Laterality Date   NASAL SINUS SURGERY Bilateral    TONSILLECTOMY      Current Medications: Current Facility-Administered Medications  Medication Dose Route Frequency Provider Last Rate Last Admin   acetaminophen  (TYLENOL ) tablet 650 mg  650 mg Oral Q6H PRN Dasie Ellouise CROME, FNP   650 mg at 06/05/24 2023   albuterol  (VENTOLIN  HFA) 108 (90 Base) MCG/ACT inhaler 2 puff  2 puff Inhalation Q6H PRN Dasie Ellouise CROME, FNP       alum & mag hydroxide-simeth (MAALOX/MYLANTA) 200-200-20 MG/5ML suspension 30 mL  30 mL Oral Q4H PRN Allen, Tina L, FNP       haloperidol  (HALDOL ) tablet 5 mg  5 mg Oral TID PRN Dasie Ellouise CROME, FNP       And   diphenhydrAMINE  (BENADRYL ) capsule 50 mg  50 mg Oral TID PRN Dasie Ellouise CROME, FNP       haloperidol  lactate (HALDOL ) injection 5 mg  5 mg Intramuscular TID PRN Dasie Ellouise CROME, FNP       And   diphenhydrAMINE  (BENADRYL ) injection 50 mg  50 mg Intramuscular TID PRN Dasie Ellouise CROME, FNP       And   LORazepam  (ATIVAN ) injection 2 mg  2 mg Intramuscular TID PRN Dasie Ellouise CROME, FNP       haloperidol  lactate  (HALDOL ) injection 10 mg  10 mg Intramuscular TID PRN Dasie Ellouise CROME, FNP       And   diphenhydrAMINE  (BENADRYL ) injection 50 mg  50 mg Intramuscular TID PRN Dasie Ellouise CROME, FNP       And   LORazepam  (ATIVAN ) injection 2 mg  2 mg Intramuscular TID PRN Dasie Ellouise CROME, FNP  hydrOXYzine  (ATARAX ) tablet 25 mg  25 mg Oral TID PRN Allen, Tina L, FNP   25 mg at 06/06/24 9176   magnesium  hydroxide (MILK OF MAGNESIA) suspension 30 mL  30 mL Oral Daily PRN Dasie Ellouise CROME, FNP   30 mL at 06/05/24 9152   nicotine  polacrilex (NICORETTE ) gum 2 mg  2 mg Oral PRN Violett Hobbs, MD   2 mg at 06/04/24 1416   OLANZapine  (ZYPREXA ) tablet 5 mg  5 mg Oral BID Dasie Ellouise CROME, FNP   5 mg at 06/06/24 9178   ondansetron  (ZOFRAN -ODT) disintegrating tablet 4 mg  4 mg Oral Q8H PRN Allen, Tina L, FNP       prazosin  (MINIPRESS ) capsule 2 mg  2 mg Oral QHS Dasie Ellouise CROME, FNP   2 mg at 06/05/24 2023   traZODone  (DESYREL ) tablet 50 mg  50 mg Oral QHS PRN Dasie Ellouise CROME, FNP   50 mg at 06/05/24 2023    Lab Results:  No results found for this or any previous visit (from the past 48 hours).   Blood Alcohol level:  Lab Results  Component Value Date   [facility] <15 06/03/2024   ETH <10 09/16/2022    Metabolic Disorder Labs: Lab Results  Component Value Date   HGBA1C 4.9 07/04/2022   MPG 93.93 07/04/2022   MPG 93.93 11/10/2021   No results found for: PROLACTIN Lab Results  Component Value Date   CHOL 150 07/04/2022   TRIG 106 07/04/2022   HDL 50 07/04/2022   CHOLHDL 3.0 07/04/2022   VLDL 21 07/04/2022   LDLCALC 79 07/04/2022   LDLCALC 55 11/10/2021    Physical Findings: AIMS:  , ,  ,  ,    CIWA:    COWS:      Psychiatric Specialty Exam:  Presentation  General Appearance:  Appropriate for Environment  Eye Contact: Fair  Speech: Clear and Coherent  Speech Volume: Normal    Mood and Affect  Mood: Depressed  Affect: Congruent   Thought Process  Thought Processes: Coherent; Linear; Goal  Directed  Orientation:Full (Time, Place and Person)  Thought Content:Logical  Hallucinations: Hearing voices  Ideas of Reference:None  Suicidal Thoughts: SI with no plan  Homicidal Thoughts: Denies   Sensorium  Memory: Immediate Fair; Recent Fair; Remote Fair  Judgment: Impaired  Insight: Lacking   Executive Functions  Concentration: Fair  Attention Span: Fair  Recall: [employer] of Knowledge: Fair  Language: Fair   Psychomotor Activity  Psychomotor Activity: No data recorded  Musculoskeletal: Strength & Muscle Tone: within normal limits Gait & Station: normal Assets  Assets: Manufacturing Systems Engineer; Desire for Improvement    Physical Exam: Physical Exam ROS Blood pressure 134/77, pulse 69, temperature 97.8 F (36.6 C), temperature source Oral, resp. rate 16, height 5' 7 (1.702 m), weight 97.5 kg, SpO2 100%. Body mass index is 33.67 kg/m.  Diagnosis: Principal Problem:   MDD (major depressive disorder), recurrent episode, with atypical features   Treatment Plan Summary:  Safety and Monitoring:             -- Voluntary admission to inpatient psychiatric unit for safety, stabilization and treatment             -- Daily contact with patient to assess and evaluate symptoms and progress in treatment             -- Patient's case to be discussed in multi-disciplinary team meeting             --  Observation Level: q15 minute checks             -- Vital signs:  q12 hours             -- Precautions: suicide, elopement, and assault   2. Psychiatric Diagnoses and Treatment:  Discontinue  Zyprexa  5 mg two times daily  Patient wants to take Seroquel  as mood stabilizer  prazosin  2 mg nightly.              -- The risks/benefits/side-effects/alternatives to this medication were discussed in detail with the patient and time was given for questions. The patient consents to medication trial.                -- Metabolic profile and EKG monitoring  obtained while on an atypical antipsychotic (BMI: Lipid Panel: HbgA1c: QTc:)              -- Encouraged patient to participate in unit milieu and in scheduled group therapies                            3. Medical Issues Being Addressed:  -No acute concerns at this time      4. Discharge Planning:   -- Social work and case management to assist with discharge planning and identification of hospital follow-up needs prior to discharge  -- Estimated LOS: 3-4 days  Allyn Foil, MD 06/06/2024, 3:11 PM

## 2024-06-06 NOTE — Group Note (Signed)
 Date:  06/06/2024 Time:  8:53 PM  Group Topic/Focus:  Wrap-Up Group:   The focus of this group is to help patients review their daily goal of treatment and discuss progress on daily workbooks.    Participation Level:  Minimal  Participation Quality:  Appropriate  Affect:  Appropriate  Cognitive:  Appropriate  Insight: Appropriate  Engagement in Group:  Limited  Modes of Intervention:  Activity and Support  Additional Comments:    Deitra Caron Mainland 06/06/2024, 8:53 PM

## 2024-06-06 NOTE — Plan of Care (Signed)

## 2024-06-06 NOTE — Progress Notes (Signed)
" °   06/06/24 0600  Psych Admission Type (Psych Patients Only)  Admission Status Voluntary  Psychosocial Assessment  Patient Complaints Anxiety;Depression  Eye Contact Fair  Facial Expression Flat  Affect Appropriate to circumstance  Speech Logical/coherent  Interaction Defensive  Motor Activity Slow  Appearance/Hygiene Unremarkable  Behavior Characteristics Cooperative  Mood Depressed;Pleasant  Aggressive Behavior  Effect No apparent injury  Thought Process  Coherency WDL  Content WDL  Delusions None reported or observed  Perception Hallucinations  Hallucination Auditory;Visual;Tactile  Judgment Impaired  Confusion None    "

## 2024-06-06 NOTE — BHH Counselor (Signed)
 Pt received application for [facility] to complete.   Jon Clayton. Chaim, MSW, LCSW, LCAS 06/06/2024 10:43 AM

## 2024-06-06 NOTE — Group Note (Signed)
 Date:  06/06/2024 Time:  1:06 PM  Group Topic/Focus:  Emotional Education:   The focus of this group is to discuss what feelings/emotions are, and how they are experienced.    Participation Level:  Active  Participation Quality:  Appropriate  Affect:  Appropriate  Cognitive:  Appropriate  Insight: Appropriate  Engagement in Group:  Engaged  Modes of Intervention:  Activity  Additional Comments:    Jon Clayton Kirby Cortese 06/06/2024, 1:06 PM

## 2024-06-06 NOTE — Progress Notes (Signed)
" °   06/06/24 1000  Psych Admission Type (Psych Patients Only)  Admission Status Voluntary  Psychosocial Assessment  Patient Complaints Anxiety  Eye Contact Fair  Facial Expression Flat  Affect Appropriate to circumstance  Speech Logical/coherent  Interaction Assertive;Defensive  Motor Activity Slow  Appearance/Hygiene Unremarkable  Behavior Characteristics Cooperative  Mood Pleasant  Aggressive Behavior  Effect No apparent injury  Thought Process  Coherency WDL  Content WDL  Delusions None reported or observed  Perception Hallucinations  Hallucination Auditory;Visual  Judgment Impaired  Confusion None  Danger to Self  Current suicidal ideation? Denies  Agreement Not to Harm Self Yes  Description of Agreement verbal  Danger to Others  Danger to Others None reported or observed   Patient had Vistaril  x 2 for anxiety. No aggressive behaviors noted. "

## 2024-06-06 NOTE — Plan of Care (Signed)
  Problem: Education: Goal: Mental status will improve Outcome: Progressing Goal: Verbalization of understanding the information provided will improve Outcome: Progressing   Problem: Activity: Goal: Interest or engagement in activities will improve Outcome: Progressing   Problem: Coping: Goal: Ability to verbalize frustrations and anger appropriately will improve Outcome: Progressing Goal: Ability to demonstrate self-control will improve Outcome: Progressing

## 2024-06-07 DIAGNOSIS — F339 Major depressive disorder, recurrent, unspecified: Secondary | ICD-10-CM | POA: Diagnosis not present

## 2024-06-07 NOTE — Progress Notes (Signed)
 [facility] MD Progress Note  06/07/2024 11:23 AM Jon Clayton  MRN:  [id number]  Patient is admitted to the inpatient psychiatric unit from behavioral health urgent care with complaints of suicidal ideation, housing instability, and auditory and visual hallucinations. Prior to reaching out for help, patient was actively contemplating suicide with a plan to get intoxicated and cut himself. This is not his first suicide attempt, with his most recent attempt occurring approximately two years ago. He continues to endorse suicidal ideations but is able to contract for safety while on the unit. When asked about homicidal ideation, patient stated I do not have any plans to kill anyone but I would like to hurt the person that still has access to my bank account. He denies access to firearms and reports no pending court dates or legal charges. Subjective:  Chart reviewed, case discussed in multidisciplinary meeting, patient seen during rounds.   12/26: On interview today, patient is found sitting in his room.  He is calm and cooperative, alert and oriented.  He is tolerating medication regimen well without adverse effects.  He is able to discuss coping skills including listening to music and coloring.  He reports fair sleep and good appetite.  He endorses suicidal ideation without intent or plan.  He is able to contract for safety in the current environment.  He endorsed HI without intent or plan earlier today but denies HI/plan currently.  He is again able to contract for safety.  He endorsed visual hallucinations this morning, stated he saw a person sitting in the chair in his room.  He endorsed auditory hallucinations today including command hallucinations to hurt self, at which point he informed unit staff and PRNs were utilized with good effect and tolerability.  Patient is again able to contract for safety in the current environment.  No self-harm behaviors have been noted on the unit.  Patient is not observed  responding to internal stimuli.   12/25: Patient is noted to be resting in bed.  He offers no complaints.  He reports that he was not feeling well and was resting instead of attending the groups.  He reports ongoing depression rating it as 8 out of 10, 10 being the worst and anxiety as 8 out of 10.  He reports ongoing auditory hallucinations of hearing voices.  He also endorses SI today morning.  Patient is encouraged to participate in groups.  Patient requested the provider to get him on Thorazine stating that at [facility] he was receiving Thorazine and has mood lability.  Patient also requested for Seroquel .  Patient and provider decided on Seroquel  as a mood stabilizer.  Past Psychiatric History: see h&P Family History: History reviewed. No pertinent family history. Social History:  Social History   Substance and Sexual Activity  Alcohol Use Not Currently   Comment: reports periodic alcohol use     Social History   Substance and Sexual Activity  Drug Use Not Currently   Frequency: 1.0 times per week   Types: Marijuana, Amphetamines   Comment: 1/3 gm daily    Social History   Socioeconomic History   Marital status: Single    Spouse name: Not on file   Number of children: Not on file   Years of education: Not on file   Highest education level: 8th grade  Occupational History   Not on file  Tobacco Use   Smoking status: Every Day    Current packs/day: 1.00    Average packs/day: 1 pack/day for 11.0  years (11.0 ttl pk-yrs)    Types: Cigarettes   Smokeless tobacco: Never  Vaping Use   Vaping status: Never Used  Substance and Sexual Activity   Alcohol use: Not Currently    Comment: reports periodic alcohol use   Drug use: Not Currently    Frequency: 1.0 times per week    Types: Marijuana, Amphetamines    Comment: 1/3 gm daily   Sexual activity: Not Currently  Other Topics Concern   Not on file  Social History Narrative   Not on file   Social Drivers of Health    Tobacco Use: High Risk (06/03/2024)   Patient History    Smoking Tobacco Use: Every Day    Smokeless Tobacco Use: Never    Passive Exposure: Not on file  Financial Resource Strain: Not at Risk (10/27/2023)   Received from [employer]    How hard is it for you to pay for the very basics like food, housing, heating, medical care, and medications?: 1  Food Insecurity: Food Insecurity Present (06/03/2024)   Epic    Worried About Programme Researcher, Broadcasting/film/video in the Last Year: Often true    Ran Out of Food in the Last Year: Often true  Transportation Needs: Unmet Transportation Needs (06/03/2024)   Epic    Lack of Transportation (Medical): Yes    Lack of Transportation (Non-Medical): Yes  Physical Activity: At Risk (10/27/2023)   Received from [facility]   Physical Activity    On average, how many minutes do you engage in exercise at this level?: 2  Stress: Not at Risk (10/27/2023)   Received from [facility]   Stress    Do you feel these kinds of stress these days?: 1  Social Connections: At Risk (10/27/2023)   Received from [facility]   Social Connections    How often do you see or talk to people that you care about and feel close to? (For example: talking to friends on phone, visiting friends or family, going to church or club meetings): 2  Depression (PHQ2-9): Medium Risk (09/21/2022)   Depression (PHQ2-9)    PHQ-2 Score: 8  Alcohol Screen: Low Risk (06/03/2024)   Alcohol Screen    Last Alcohol Screening Score (AUDIT): 4  Housing: High Risk (06/03/2024)   Epic    Unable to Pay for Housing in the Last Year: Yes    Number of Times Moved in the Last Year: 0    Homeless in the Last Year: Yes  Utilities: At Risk (06/03/2024)   Epic    Threatened with loss of utilities: Yes  Health Literacy: Not on file   Past Medical History:  Past Medical History:  Diagnosis Date   Depression    Psychosis (HCC)    PTSD (post-traumatic stress disorder)     Past Surgical History:  Procedure  Laterality Date   NASAL SINUS SURGERY Bilateral    TONSILLECTOMY      Current Medications: Current Facility-Administered Medications  Medication Dose Route Frequency Provider Last Rate Last Admin   acetaminophen  (TYLENOL ) tablet 650 mg  650 mg Oral Q6H PRN Dasie Ellouise CROME, FNP   650 mg at 06/05/24 2023   albuterol  (VENTOLIN  HFA) 108 (90 Base) MCG/ACT inhaler 2 puff  2 puff Inhalation Q6H PRN Dasie Ellouise CROME, FNP       alum & mag hydroxide-simeth (MAALOX/MYLANTA) 200-200-20 MG/5ML suspension 30 mL  30 mL Oral Q4H PRN Dasie Ellouise CROME, FNP       haloperidol  (  HALDOL ) tablet 5 mg  5 mg Oral TID PRN Dasie Ellouise CROME, FNP       And   diphenhydrAMINE  (BENADRYL ) capsule 50 mg  50 mg Oral TID PRN Allen, Tina L, FNP       haloperidol  lactate (HALDOL ) injection 5 mg  5 mg Intramuscular TID PRN Dasie Ellouise CROME, FNP       And   diphenhydrAMINE  (BENADRYL ) injection 50 mg  50 mg Intramuscular TID PRN Dasie Ellouise CROME, FNP       And   LORazepam  (ATIVAN ) injection 2 mg  2 mg Intramuscular TID PRN Dasie Ellouise CROME, FNP       haloperidol  lactate (HALDOL ) injection 10 mg  10 mg Intramuscular TID PRN Dasie Ellouise CROME, FNP       And   diphenhydrAMINE  (BENADRYL ) injection 50 mg  50 mg Intramuscular TID PRN Dasie Ellouise CROME, FNP       And   LORazepam  (ATIVAN ) injection 2 mg  2 mg Intramuscular TID PRN Allen, Tina L, FNP       hydrOXYzine  (ATARAX ) tablet 25 mg  25 mg Oral TID PRN Allen, Tina L, FNP   25 mg at 06/07/24 0840   magnesium  hydroxide (MILK OF MAGNESIA) suspension 30 mL  30 mL Oral Daily PRN Dasie Ellouise CROME, FNP   30 mL at 06/05/24 9152   nicotine  polacrilex (NICORETTE ) gum 2 mg  2 mg Oral PRN Jadapalle, Sree, MD   2 mg at 06/04/24 1416   OLANZapine  (ZYPREXA ) tablet 5 mg  5 mg Oral BID Dasie Ellouise CROME, FNP   5 mg at 06/07/24 9161   ondansetron  (ZOFRAN -ODT) disintegrating tablet 4 mg  4 mg Oral Q8H PRN Dasie Ellouise CROME, FNP       prazosin  (MINIPRESS ) capsule 2 mg  2 mg Oral QHS Dasie Ellouise CROME, FNP   2 mg at 06/06/24 2120   traZODone   (DESYREL ) tablet 50 mg  50 mg Oral QHS PRN Allen, Tina L, FNP   50 mg at 06/06/24 2120    Lab Results:  No results found for this or any previous visit (from the past 48 hours).   Blood Alcohol level:  Lab Results  Component Value Date   [facility] <15 06/03/2024   ETH <10 09/16/2022    Metabolic Disorder Labs: Lab Results  Component Value Date   HGBA1C 4.9 07/04/2022   MPG 93.93 07/04/2022   MPG 93.93 11/10/2021   No results found for: PROLACTIN Lab Results  Component Value Date   CHOL 150 07/04/2022   TRIG 106 07/04/2022   HDL 50 07/04/2022   CHOLHDL 3.0 07/04/2022   VLDL 21 07/04/2022   LDLCALC 79 07/04/2022   LDLCALC 55 11/10/2021    Physical Findings: AIMS:  , ,  ,  ,    CIWA:    COWS:      Psychiatric Specialty Exam:  Presentation  General Appearance:  Appropriate for Environment  Eye Contact: Fair  Speech: Clear and Coherent  Speech Volume: Normal    Mood and Affect  Mood: Depressed  Affect: Congruent   Thought Process  Thought Processes: Coherent; Linear; Goal Directed  Orientation:Full (Time, Place and Person)  Thought Content:Logical  Hallucinations: Visual, auditory  Ideas of Reference:None  Suicidal Thoughts: SI without intent, without plan  Homicidal Thoughts: Denies at time of interview   Sensorium  Memory: Immediate Fair; Recent Fair; Remote Fair  Judgment: Impaired  Insight: Lacking   Executive Functions  Concentration: Fair  Attention Span:  Fair  Recall: Dotti Abe of Knowledge: Fair  Language: Fair   Psychomotor Activity  Psychomotor Activity: Normal  Musculoskeletal: Strength & Muscle Tone: within normal limits Gait & Station: normal Assets  Assets: Manufacturing Systems Engineer; Desire for Improvement    Physical Exam: Physical Exam ROS Blood pressure 111/80, pulse 61, temperature 97.7 F (36.5 C), temperature source Oral, resp. rate 16, height 5' 7 (1.702 m), weight 97.5 kg, SpO2 99%.  Body mass index is 33.67 kg/m.  Diagnosis: Principal Problem:   MDD (major depressive disorder), recurrent episode, with atypical features   Treatment Plan Summary:  Safety and Monitoring:             -- Voluntary admission to inpatient psychiatric unit for safety, stabilization and treatment             -- Daily contact with patient to assess and evaluate symptoms and progress in treatment             -- Patient's case to be discussed in multi-disciplinary team meeting             -- Observation Level: q15 minute checks             -- Vital signs:  q12 hours             -- Precautions: suicide, elopement, and assault   2. Psychiatric Diagnoses and Treatment:  Zyprexa  5 mg two times daily  Patient wants to take Seroquel  as mood stabilizer - discussed cannot take both Zyprexa  and Seroquel , will consider switching from Zyprexa  to Seroquel   prazosin  2 mg nightly              -- The risks/benefits/side-effects/alternatives to this medication were discussed in detail with the patient and time was given for questions. The patient consents to medication trial.                -- Metabolic profile and EKG monitoring obtained while on an atypical antipsychotic (BMI: Lipid Panel: HbgA1c: QTc:)              -- Encouraged patient to participate in unit milieu and in scheduled group therapies                            3. Medical Issues Being Addressed:  -No acute concerns at this time      4. Discharge Planning:   -- Social work and case management to assist with discharge planning and identification of hospital follow-up needs prior to discharge  -- Estimated LOS: 5-7 days Case discussed with attending physician, Dr. Jadapalle, who is in agreement with plan. Claudetta Sallie LITTIE Lukes, PA-C 06/07/2024, 11:23 AM

## 2024-06-07 NOTE — Progress Notes (Signed)
" °   06/07/24 1000  Psych Admission Type (Psych Patients Only)  Admission Status Voluntary  Psychosocial Assessment  Patient Complaints Anxiety;Depression  Eye Contact Fair  Facial Expression Flat  Affect Appropriate to circumstance  Speech Logical/coherent  Interaction Assertive  Motor Activity Slow  Appearance/Hygiene Unremarkable  Behavior Characteristics Cooperative  Mood Pleasant;Depressed  Aggressive Behavior  Effect No apparent injury  Thought Process  Coherency WDL  Content WDL  Delusions None reported or observed  Perception Hallucinations  Hallucination Visual  Judgment Impaired  Confusion None  Danger to Self  Current suicidal ideation? Denies  Danger to Others  Danger to Others None reported or observed    "

## 2024-06-07 NOTE — Plan of Care (Signed)

## 2024-06-07 NOTE — Progress Notes (Signed)
" °   06/07/24 0300  Psych Admission Type (Psych Patients Only)  Admission Status Voluntary  Psychosocial Assessment  Patient Complaints Anxiety  Eye Contact Fair  Facial Expression Flat  Affect Appropriate to circumstance  Speech Logical/coherent  Interaction Defensive  Motor Activity Slow  Appearance/Hygiene Unremarkable  Behavior Characteristics Cooperative  Mood Pleasant  Aggressive Behavior  Effect No apparent injury  Thought Process  Coherency WDL  Content WDL  Delusions None reported or observed  Perception Hallucinations  Hallucination Auditory;Visual;Tactile  Judgment Impaired  Confusion None  Danger to Self  Current suicidal ideation? Denies  Danger to Others  Danger to Others None reported or observed    "

## 2024-06-07 NOTE — BHH Counselor (Addendum)
 CSW received voicemail from Rosaline NOVAK of [employer]. Rosaline left message that pt does not meet the requirements to come to this facility. Rosaline specified that pt does not meet requirements due to being on the sex offender registry.   CSW spoke with pt briefly regarding update. CSW inquired regarding registry status. Pt confirmed him status. CSW shared that he had been declined due to this fact. Pt stated that he had completed application for [facility] and asked why CSW had sent stuff to [facility]. CSW informed him that it had been sent per his request. Pt stated that CSW should not be talking with pt about this, it was something that should have been discussed in an office. CSW informed pt that we were in his room with the door closed. Pt stated that people can hear through these doors. He stated that this type of thing can get one stabbed or killed and made vague statement that CSW understood what he was saying. CSW apologized if pt had been offended and informed him that his application would be sent off. No other concerns expressed. Contact ended without incident.     CSW made multiple attempts to get in contact with [facility] [phone]) regarding bed availability and email to send application to. Contact was unable to be established but HIPAA compliant voicemail left with contact information for follow through.   CSW attempted to send application to [facility] ([email or web address]). CSW received notification that it could not be delivered.   CSW will continue to contact for information.   Nadara SAUNDERS. Chaim, MSW, LCSW, LCAS 06/07/2024 3:32 PM

## 2024-06-07 NOTE — Group Note (Signed)
 Date:  06/07/2024 Time:  8:51 PM  Group Topic/Focus:  Overcoming Stress:   The focus of this group is to define stress and help patients assess their triggers.    Participation Level:  Did Not Attend   Ginny JONETTA Galeazzi 06/07/2024, 8:51 PM

## 2024-06-07 NOTE — Group Note (Deleted)
 Date:  06/07/2024 Time:  8:47 PM  Group Topic/Focus:  Overcoming Stress:   The focus of this group is to define stress and help patients assess their triggers.     Participation Level:  {BHH PARTICIPATION OZCZO:77735}  Participation Quality:  {BHH PARTICIPATION QUALITY:22265}  Affect:  {BHH AFFECT:22266}  Cognitive:  {BHH COGNITIVE:22267}  Insight: {BHH Insight2:20797}  Engagement in Group:  {BHH ENGAGEMENT IN HMNLE:77731}  Modes of Intervention:  {BHH MODES OF INTERVENTION:22269}  Additional Comments:  PIERRETTE Ginny JONETTA Orma 06/07/2024, 8:47 PM

## 2024-06-07 NOTE — Progress Notes (Signed)
 Pt calm and pleasant during assessment denying SI/HI/AVH Pt observed by this writer being more active on the unit than when I had them earlier this admission. Pt compliant with medication administration per MD orders. Pt given education, support, and encouragement to be active in his treatment plan. Pt being monitored Q 15 minutes for safety per unit protocol, remains safe on the unit

## 2024-06-07 NOTE — Group Note (Signed)
 Date:  06/07/2024 Time:  5:52 PM  Group Topic/Focus:  Activity Group: The focus of the group is to promote activity for the patients and to encourage them to go outside to the courtyard and get some fresh air and some exercise.    Participation Level:  Did Not Attend   [name] HERO Jon Clayton 06/07/2024, 5:52 PM

## 2024-06-08 DIAGNOSIS — F339 Major depressive disorder, recurrent, unspecified: Secondary | ICD-10-CM | POA: Diagnosis not present

## 2024-06-08 MED ORDER — OLANZAPINE 5 MG PO TABS
7.5000 mg | ORAL_TABLET | Freq: Two times a day (BID) | ORAL | Status: DC
  Administered 2024-06-08 – 2024-06-10 (×5): 7.5 mg via ORAL
  Filled 2024-06-08 (×4): qty 2

## 2024-06-08 NOTE — Plan of Care (Signed)
   Problem: Education: Goal: Emotional status will improve Outcome: Progressing Goal: Mental status will improve Outcome: Progressing

## 2024-06-08 NOTE — Group Note (Signed)
 Date:  06/08/2024 Time:  6:04 PM  Group Topic/Focus:  Dimensions of Wellness:   The focus of this group is to introduce the topic of wellness and discuss the role each dimension of wellness plays in total health.    Participation Level:  Did Not Attend   [name] HERO Jon Clayton 06/08/2024, 6:04 PM

## 2024-06-08 NOTE — Progress Notes (Signed)
 Pt came up to the nurses station stating he was hearing voices. Medication given, check MAR. Will continue to monitor the patient. Pt remains safe on the unit

## 2024-06-08 NOTE — Group Note (Signed)
 Date:  06/08/2024 Time:  1:32 PM  Group Topic/Focus:  Building Self Esteem:   The Focus of this group is helping patients become aware of the effects of self-esteem on their lives, the things they and others do that enhance or undermine their self-esteem, seeing the relationship between their level of self-esteem and the choices they make and learning ways to enhance self-esteem.    Participation Level:  Did Not Attend   Jon Clayton [name] June 06/08/2024, 1:32 PM

## 2024-06-08 NOTE — Plan of Care (Signed)

## 2024-06-08 NOTE — Progress Notes (Signed)
" °   06/08/24 0900  Psych Admission Type (Psych Patients Only)  Admission Status Voluntary  Psychosocial Assessment  Patient Complaints Sadness (pt reported he had thoughts of wanting to hang himself last pm)  Eye Contact Fair  Facial Expression Flat  Affect Appropriate to circumstance  Speech Logical/coherent  Interaction Assertive  Motor Activity Slow  Appearance/Hygiene Unremarkable  Behavior Characteristics Cooperative;Appropriate to situation  Mood Depressed  Aggressive Behavior  Effect No apparent injury  Thought Process  Coherency WDL  Content WDL  Delusions None reported or observed  Perception WDL (not at present)  Hallucination Visual  Judgment Impaired  Confusion None  Danger to Self  Current suicidal ideation? Denies  Agreement Not to Harm Self Yes  Description of Agreement Verbal  Danger to Others  Danger to Others None reported or observed    "

## 2024-06-08 NOTE — Group Note (Signed)
 LCSW Group Therapy Note   Group Date: 06/08/2024 Start Time: 1620 End Time: 1650   Type of Therapy and Topic:  Group Therapy: Managing Intrusive Thoughts  Participation Level:  Did Not Attend  Description of Group: The purpose of this group is to assist patients in learning how to regulate negative intrusive thoughts.  Intrusive thoughts are unwanted thoughts or vivid images that suddenly enter your mind.  They may be disturbing to the person experiencing them and may trigger feelings like anxiety, shame, or disgust.  Emphasis will be placed on coping with negative intrusive thoughts in situations and using positive strategies to combat negative intrusive thoughts and using positive strategies combat negative thoughts.  Therapeutic Goals:  1. Patient will identify the difference of an inconsequential thought vs an urgent thought. 2.Patient will discuss when they need to seek assistance with negative intrusive thoughts.           Summary of Patient Progress:    Patient did not attend group.  Therapeutic Modalities:  Cognitive Behavioral Therapy Dialectical Behavior Therapy Jalaiyah Throgmorton W Sherard Sutch, LCSWA 06/08/2024  5:09 PM

## 2024-06-08 NOTE — Progress Notes (Signed)
 [facility] MD Progress Note  06/08/2024 4:31 PM Jon Clayton  MRN:  [id number]  Patient is admitted to the inpatient psychiatric unit from behavioral health urgent care with complaints of suicidal ideation, housing instability, and auditory and visual hallucinations. Prior to reaching out for help, patient was actively contemplating suicide with a plan to get intoxicated and cut himself. This is not his first suicide attempt, with his most recent attempt occurring approximately two years ago. He continues to endorse suicidal ideations but is able to contract for safety while on the unit. When asked about homicidal ideation, patient stated I do not have any plans to kill anyone but I would like to hurt the person that still has access to my bank account. He denies access to firearms and reports no pending court dates or legal charges. Subjective:  Chart reviewed, case discussed in multidisciplinary meeting, patient seen during rounds.   12/27: On interview today, patient is found sitting in his room.  He is alert and oriented, calm and cooperative.  He reports fair sleep and improving appetite.  He rates current depressive symptoms as 6 out of 10 and denies anxiety at this time.  Patient denies SI/HI/plan.  He endorses auditory hallucinations last night and this morning, reporting hearing sounds and voices saying a lot of things. Per nursing report from Jon Lawrence, RN, Pt expressed hearing voices that sound like porn without the picture and a man named [name] that tells him to do things like proposition ppl (and he stated that was his role as a child).  Patient denies visual hallucinations at time of interview.  Patient is able to contract for safety in the current environment.  Patient is agreeable to Zyprexa  dose increase today.  12/26: On interview today, patient is found sitting in his room.  He is calm and cooperative, alert and oriented.  He is tolerating medication regimen well without adverse  effects.  He is able to discuss coping skills including listening to music and coloring.  He reports fair sleep and good appetite.  He endorses suicidal ideation without intent or plan.  He is able to contract for safety in the current environment.  He endorsed HI without intent or plan earlier today but denies HI/plan currently.  He is again able to contract for safety.  He endorsed visual hallucinations this morning, stated he saw a person sitting in the chair in his room.  He endorsed auditory hallucinations today including command hallucinations to hurt self, at which point he informed unit staff and PRNs were utilized with good effect and tolerability.  Patient is again able to contract for safety in the current environment.  No self-harm behaviors have been noted on the unit.  Patient is not observed responding to internal stimuli.   12/25: Patient is noted to be resting in bed.  He offers no complaints.  He reports that he was not feeling well and was resting instead of attending the groups.  He reports ongoing depression rating it as 8 out of 10, 10 being the worst and anxiety as 8 out of 10.  He reports ongoing auditory hallucinations of hearing voices.  He also endorses SI today morning.  Patient is encouraged to participate in groups.  Patient requested the provider to get him on Thorazine stating that at [facility] he was receiving Thorazine and has mood lability.  Patient also requested for Seroquel .  Patient and provider decided on Seroquel  as a mood stabilizer.  Past Psychiatric History: see h&P Family  History: History reviewed. No pertinent family history. Social History:  Social History   Substance and Sexual Activity  Alcohol Use Not Currently   Comment: reports periodic alcohol use     Social History   Substance and Sexual Activity  Drug Use Not Currently   Frequency: 1.0 times per week   Types: Marijuana, Amphetamines   Comment: 1/3 gm daily    Social History    Socioeconomic History   Marital status: Single    Spouse name: Not on file   Number of children: Not on file   Years of education: Not on file   Highest education level: 8th grade  Occupational History   Not on file  Tobacco Use   Smoking status: Every Day    Current packs/day: 1.00    Average packs/day: 1 pack/day for 11.0 years (11.0 ttl pk-yrs)    Types: Cigarettes   Smokeless tobacco: Never  Vaping Use   Vaping status: Never Used  Substance and Sexual Activity   Alcohol use: Not Currently    Comment: reports periodic alcohol use   Drug use: Not Currently    Frequency: 1.0 times per week    Types: Marijuana, Amphetamines    Comment: 1/3 gm daily   Sexual activity: Not Currently  Other Topics Concern   Not on file  Social History Narrative   Not on file   Social Drivers of Health   Tobacco Use: High Risk (06/03/2024)   Patient History    Smoking Tobacco Use: Every Day    Smokeless Tobacco Use: Never    Passive Exposure: Not on file  Financial Resource Strain: Not at Risk (10/27/2023)   Received from [employer]    How hard is it for you to pay for the very basics like food, housing, heating, medical care, and medications?: 1  Food Insecurity: Food Insecurity Present (06/03/2024)   Epic    Worried About Programme Researcher, Broadcasting/film/video in the Last Year: Often true    Ran Out of Food in the Last Year: Often true  Transportation Needs: Unmet Transportation Needs (06/03/2024)   Epic    Lack of Transportation (Medical): Yes    Lack of Transportation (Non-Medical): Yes  Physical Activity: At Risk (10/27/2023)   Received from [facility]   Physical Activity    On average, how many minutes do you engage in exercise at this level?: 2  Stress: Not at Risk (10/27/2023)   Received from [facility]   Stress    Do you feel these kinds of stress these days?: 1  Social Connections: At Risk (10/27/2023)   Received from [facility]   Social Connections    How often do you see or  talk to people that you care about and feel close to? (For example: talking to friends on phone, visiting friends or family, going to church or club meetings): 2  Depression (PHQ2-9): Medium Risk (09/21/2022)   Depression (PHQ2-9)    PHQ-2 Score: 8  Alcohol Screen: Low Risk (06/03/2024)   Alcohol Screen    Last Alcohol Screening Score (AUDIT): 4  Housing: High Risk (06/03/2024)   Epic    Unable to Pay for Housing in the Last Year: Yes    Number of Times Moved in the Last Year: 0    Homeless in the Last Year: Yes  Utilities: At Risk (06/03/2024)   Epic    Threatened with loss of utilities: Yes  Health Literacy: Not on file   Past Medical History:  Past Medical History:  Diagnosis Date   Depression    Psychosis (HCC)    PTSD (post-traumatic stress disorder)     Past Surgical History:  Procedure Laterality Date   NASAL SINUS SURGERY Bilateral    TONSILLECTOMY      Current Medications: Current Facility-Administered Medications  Medication Dose Route Frequency Provider Last Rate Last Admin   acetaminophen  (TYLENOL ) tablet 650 mg  650 mg Oral Q6H PRN Dasie Ellouise CROME, FNP   650 mg at 06/05/24 2023   albuterol  (VENTOLIN  HFA) 108 (90 Base) MCG/ACT inhaler 2 puff  2 puff Inhalation Q6H PRN Dasie Ellouise CROME, FNP       alum & mag hydroxide-simeth (MAALOX/MYLANTA) 200-200-20 MG/5ML suspension 30 mL  30 mL Oral Q4H PRN Allen, Tina L, FNP       haloperidol  (HALDOL ) tablet 5 mg  5 mg Oral TID PRN Dasie Ellouise CROME, FNP   5 mg at 06/08/24 9781   And   diphenhydrAMINE  (BENADRYL ) capsule 50 mg  50 mg Oral TID PRN Allen, Tina L, FNP   50 mg at 06/08/24 9781   haloperidol  lactate (HALDOL ) injection 5 mg  5 mg Intramuscular TID PRN Dasie Ellouise CROME, FNP       And   diphenhydrAMINE  (BENADRYL ) injection 50 mg  50 mg Intramuscular TID PRN Dasie Ellouise CROME, FNP       And   LORazepam  (ATIVAN ) injection 2 mg  2 mg Intramuscular TID PRN Dasie Ellouise CROME, FNP       haloperidol  lactate (HALDOL ) injection 10 mg  10 mg  Intramuscular TID PRN Dasie Ellouise CROME, FNP       And   diphenhydrAMINE  (BENADRYL ) injection 50 mg  50 mg Intramuscular TID PRN Dasie Ellouise CROME, FNP       And   LORazepam  (ATIVAN ) injection 2 mg  2 mg Intramuscular TID PRN Allen, Tina L, FNP       hydrOXYzine  (ATARAX ) tablet 25 mg  25 mg Oral TID PRN Allen, Tina L, FNP   25 mg at 06/07/24 2102   magnesium  hydroxide (MILK OF MAGNESIA) suspension 30 mL  30 mL Oral Daily PRN Dasie Ellouise CROME, FNP   30 mL at 06/05/24 0847   nicotine  polacrilex (NICORETTE ) gum 2 mg  2 mg Oral PRN Jadapalle, Sree, MD   2 mg at 06/04/24 1416   OLANZapine  (ZYPREXA ) tablet 7.5 mg  7.5 mg Oral BID Shrivastava, Aryendra, MD       ondansetron  (ZOFRAN -ODT) disintegrating tablet 4 mg  4 mg Oral Q8H PRN Dasie Ellouise CROME, FNP       prazosin  (MINIPRESS ) capsule 2 mg  2 mg Oral QHS Dasie Ellouise CROME, FNP   2 mg at 06/07/24 2101   traZODone  (DESYREL ) tablet 50 mg  50 mg Oral QHS PRN Dasie Ellouise CROME, FNP   50 mg at 06/07/24 2101    Lab Results:  No results found for this or any previous visit (from the past 48 hours).   Blood Alcohol level:  Lab Results  Component Value Date   [facility] <15 06/03/2024   ETH <10 09/16/2022    Metabolic Disorder Labs: Lab Results  Component Value Date   HGBA1C 4.9 07/04/2022   MPG 93.93 07/04/2022   MPG 93.93 11/10/2021   No results found for: PROLACTIN Lab Results  Component Value Date   CHOL 150 07/04/2022   TRIG 106 07/04/2022   HDL 50 07/04/2022   CHOLHDL 3.0 07/04/2022   VLDL 21 07/04/2022  LDLCALC 79 07/04/2022   LDLCALC 55 11/10/2021    Physical Findings: AIMS:  , ,  ,  ,    CIWA:    COWS:      Psychiatric Specialty Exam:  Presentation  General Appearance:  Appropriate for Environment  Eye Contact: Fair  Speech: Clear and Coherent  Speech Volume: Normal    Mood and Affect  Mood: Depressed  Affect: Congruent   Thought Process  Thought Processes: Coherent; Linear; Goal Directed  Orientation:Full (Time, Place  and Person)  Thought Content:Logical  Hallucinations: Auditory  Ideas of Reference:None  Suicidal Thoughts: Denies  Homicidal Thoughts: Denies    Sensorium  Memory: Immediate Fair; Recent Fair; Remote Fair  Judgment: Impaired  Insight: Poor   Art Therapist  Concentration: Fair  Attention Span: Fair  Recall: [employer] of Knowledge: Fair  Language: Fair   Psychomotor Activity  Psychomotor Activity: Normal  Musculoskeletal: Strength & Muscle Tone: within normal limits Gait & Station: normal Assets  Assets: Manufacturing Systems Engineer; Desire for Improvement    Physical Exam: Physical Exam ROS Blood pressure 136/86, pulse 68, temperature 97.8 F (36.6 C), temperature source Oral, resp. rate 16, height 5' 7 (1.702 m), weight 97.5 kg, SpO2 99%. Body mass index is 33.67 kg/m.  Diagnosis: Principal Problem:   MDD (major depressive disorder), recurrent episode, with atypical features   Treatment Plan Summary:  Safety and Monitoring:             -- Voluntary admission to inpatient psychiatric unit for safety, stabilization and treatment             -- Daily contact with patient to assess and evaluate symptoms and progress in treatment             -- Patient's case to be discussed in multi-disciplinary team meeting             -- Observation Level: q15 minute checks             -- Vital signs:  q12 hours             -- Precautions: suicide, elopement, and assault   2. Psychiatric Diagnoses and Treatment:  Increase Zyprexa  to 7.5 mg two times daily  Patient wants to take Seroquel  as mood stabilizer - discussed cannot take both Zyprexa  and Seroquel , will consider switching from Zyprexa  to Seroquel   prazosin  2 mg nightly              -- The risks/benefits/side-effects/alternatives to this medication were discussed in detail with the patient and time was given for questions. The patient consents to medication trial.                -- Metabolic  profile and EKG monitoring obtained while on an atypical antipsychotic (BMI: Lipid Panel: HbgA1c: QTc:)              -- Encouraged patient to participate in unit milieu and in scheduled group therapies                            3. Medical Issues Being Addressed:  -No acute concerns at this time      4. Discharge Planning:   -- Social work and case management to assist with discharge planning and identification of hospital follow-up needs prior to discharge  -- Estimated LOS: 5-7 days Case discussed with attending physician, Dr. Hellen, who is in agreement with plan. Rogers Ditter LITTIE Lukes, PA-C  06/08/2024, 4:31 PM

## 2024-06-09 DIAGNOSIS — F339 Major depressive disorder, recurrent, unspecified: Secondary | ICD-10-CM | POA: Diagnosis not present

## 2024-06-09 MED ORDER — PRAZOSIN HCL 2 MG PO CAPS
3.0000 mg | ORAL_CAPSULE | Freq: Every day | ORAL | Status: DC
  Administered 2024-06-09 – 2024-06-10 (×2): 3 mg via ORAL
  Filled 2024-06-09: qty 1

## 2024-06-09 NOTE — Progress Notes (Signed)
 [facility] MD Progress Note  06/09/2024 3:30 PM Jon Clayton  MRN:  [id number]  Patient is admitted to the inpatient psychiatric unit from behavioral health urgent care with complaints of suicidal ideation, housing instability, and auditory and visual hallucinations. Prior to reaching out for help, patient was actively contemplating suicide with a plan to get intoxicated and cut himself. This is not his first suicide attempt, with his most recent attempt occurring approximately two years ago. He continues to endorse suicidal ideations but is able to contract for safety while on the unit. When asked about homicidal ideation, patient stated I do not have any plans to kill anyone but I would like to hurt the person that still has access to my bank account. He denies access to firearms and reports no pending court dates or legal charges. Subjective:  Chart reviewed, case discussed in multidisciplinary meeting, patient seen during rounds.   12/28: On interview today, patient is found resting in bed.  He is alert and oriented, calm and cooperative.  He is tolerating medication regimen well without adverse effects.  He denies SI/HI/plan.  He reports intermittent auditory hallucinations today, but states overall improved compared to yesterday.  He denies visual hallucinations today.  He reports improving anxiety and depressive symptoms.  He reports poor sleep last night due to nightmares, he is agreeable to prazosin  dose increase today; risks, benefits, and potential side effects discussed including risk of orthostatic hypotension, patient verbalized understanding, all questions answered.  12/27: On interview today, patient is found sitting in his room.  He is alert and oriented, calm and cooperative.  He reports fair sleep and improving appetite.  He rates current depressive symptoms as 6 out of 10 and denies anxiety at this time.  Patient denies SI/HI/plan.  He endorses auditory hallucinations last night and this  morning, reporting hearing sounds and voices saying a lot of things. Per nursing report from Jon Lawrence, RN, Pt expressed hearing voices that sound like porn without the picture and a man named [name] that tells him to do things like proposition ppl (and he stated that was his role as a child).  Patient denies visual hallucinations at time of interview.  Patient is able to contract for safety in the current environment.  Patient is agreeable to Zyprexa  dose increase today.  12/26: On interview today, patient is found sitting in his room.  He is calm and cooperative, alert and oriented.  He is tolerating medication regimen well without adverse effects.  He is able to discuss coping skills including listening to music and coloring.  He reports fair sleep and good appetite.  He endorses suicidal ideation without intent or plan.  He is able to contract for safety in the current environment.  He endorsed HI without intent or plan earlier today but denies HI/plan currently.  He is again able to contract for safety.  He endorsed visual hallucinations this morning, stated he saw a person sitting in the chair in his room.  He endorsed auditory hallucinations today including command hallucinations to hurt self, at which point he informed unit staff and PRNs were utilized with good effect and tolerability.  Patient is again able to contract for safety in the current environment.  No self-harm behaviors have been noted on the unit.  Patient is not observed responding to internal stimuli.   12/25: Patient is noted to be resting in bed.  He offers no complaints.  He reports that he was not feeling well and was resting instead  of attending the groups.  He reports ongoing depression rating it as 8 out of 10, 10 being the worst and anxiety as 8 out of 10.  He reports ongoing auditory hallucinations of hearing voices.  He also endorses SI today morning.  Patient is encouraged to participate in groups.  Patient requested  the provider to get him on Thorazine stating that at [facility] he was receiving Thorazine and has mood lability.  Patient also requested for Seroquel .  Patient and provider decided on Seroquel  as a mood stabilizer.  Past Psychiatric History: see h&P Family History: History reviewed. No pertinent family history. Social History:  Social History   Substance and Sexual Activity  Alcohol Use Not Currently   Comment: reports periodic alcohol use     Social History   Substance and Sexual Activity  Drug Use Not Currently   Frequency: 1.0 times per week   Types: Marijuana, Amphetamines   Comment: 1/3 gm daily    Social History   Socioeconomic History   Marital status: Single    Spouse name: Not on file   Number of children: Not on file   Years of education: Not on file   Highest education level: 8th grade  Occupational History   Not on file  Tobacco Use   Smoking status: Every Day    Current packs/day: 1.00    Average packs/day: 1 pack/day for 11.0 years (11.0 ttl pk-yrs)    Types: Cigarettes   Smokeless tobacco: Never  Vaping Use   Vaping status: Never Used  Substance and Sexual Activity   Alcohol use: Not Currently    Comment: reports periodic alcohol use   Drug use: Not Currently    Frequency: 1.0 times per week    Types: Marijuana, Amphetamines    Comment: 1/3 gm daily   Sexual activity: Not Currently  Other Topics Concern   Not on file  Social History Narrative   Not on file   Social Drivers of Health   Tobacco Use: High Risk (06/03/2024)   Patient History    Smoking Tobacco Use: Every Day    Smokeless Tobacco Use: Never    Passive Exposure: Not on file  Financial Resource Strain: Not at Risk (10/27/2023)   Received from [employer]    How hard is it for you to pay for the very basics like food, housing, heating, medical care, and medications?: 1  Food Insecurity: Food Insecurity Present (06/03/2024)   Epic    Worried About Patent Examiner in the Last Year: Often true    Ran Out of Food in the Last Year: Often true  Transportation Needs: Unmet Transportation Needs (06/03/2024)   Epic    Lack of Transportation (Medical): Yes    Lack of Transportation (Non-Medical): Yes  Physical Activity: At Risk (10/27/2023)   Received from [facility]   Physical Activity    On average, how many minutes do you engage in exercise at this level?: 2  Stress: Not at Risk (10/27/2023)   Received from [facility]   Stress    Do you feel these kinds of stress these days?: 1  Social Connections: At Risk (10/27/2023)   Received from [facility]   Social Connections    How often do you see or talk to people that you care about and feel close to? (For example: talking to friends on phone, visiting friends or family, going to church or club meetings): 2  Depression (PHQ2-9): Medium Risk (09/21/2022)  Depression (PHQ2-9)    PHQ-2 Score: 8  Alcohol Screen: Low Risk (06/03/2024)   Alcohol Screen    Last Alcohol Screening Score (AUDIT): 4  Housing: High Risk (06/03/2024)   Epic    Unable to Pay for Housing in the Last Year: Yes    Number of Times Moved in the Last Year: 0    Homeless in the Last Year: Yes  Utilities: At Risk (06/03/2024)   Epic    Threatened with loss of utilities: Yes  Health Literacy: Not on file   Past Medical History:  Past Medical History:  Diagnosis Date   Depression    Psychosis (HCC)    PTSD (post-traumatic stress disorder)     Past Surgical History:  Procedure Laterality Date   NASAL SINUS SURGERY Bilateral    TONSILLECTOMY      Current Medications: Current Facility-Administered Medications  Medication Dose Route Frequency Provider Last Rate Last Admin   acetaminophen  (TYLENOL ) tablet 650 mg  650 mg Oral Q6H PRN Dasie Ellouise CROME, FNP   650 mg at 06/05/24 2023   albuterol  (VENTOLIN  HFA) 108 (90 Base) MCG/ACT inhaler 2 puff  2 puff Inhalation Q6H PRN Dasie Ellouise CROME, FNP       alum & mag hydroxide-simeth (MAALOX/MYLANTA)  200-200-20 MG/5ML suspension 30 mL  30 mL Oral Q4H PRN Allen, Tina L, FNP       haloperidol  (HALDOL ) tablet 5 mg  5 mg Oral TID PRN Dasie Ellouise CROME, FNP   5 mg at 06/08/24 9781   And   diphenhydrAMINE  (BENADRYL ) capsule 50 mg  50 mg Oral TID PRN Dasie Ellouise CROME, FNP   50 mg at 06/08/24 9781   haloperidol  lactate (HALDOL ) injection 5 mg  5 mg Intramuscular TID PRN Dasie Ellouise CROME, FNP       And   diphenhydrAMINE  (BENADRYL ) injection 50 mg  50 mg Intramuscular TID PRN Dasie Ellouise CROME, FNP       And   LORazepam  (ATIVAN ) injection 2 mg  2 mg Intramuscular TID PRN Dasie Ellouise CROME, FNP       haloperidol  lactate (HALDOL ) injection 10 mg  10 mg Intramuscular TID PRN Dasie Ellouise CROME, FNP       And   diphenhydrAMINE  (BENADRYL ) injection 50 mg  50 mg Intramuscular TID PRN Dasie Ellouise CROME, FNP       And   LORazepam  (ATIVAN ) injection 2 mg  2 mg Intramuscular TID PRN Allen, Tina L, FNP       hydrOXYzine  (ATARAX ) tablet 25 mg  25 mg Oral TID PRN Allen, Tina L, FNP   25 mg at 06/08/24 2123   magnesium  hydroxide (MILK OF MAGNESIA) suspension 30 mL  30 mL Oral Daily PRN Dasie Ellouise CROME, FNP   30 mL at 06/05/24 0847   nicotine  polacrilex (NICORETTE ) gum 2 mg  2 mg Oral PRN Jadapalle, Sree, MD   2 mg at 06/04/24 1416   OLANZapine  (ZYPREXA ) tablet 7.5 mg  7.5 mg Oral BID Shrivastava, Aryendra, MD   7.5 mg at 06/09/24 0809   ondansetron  (ZOFRAN -ODT) disintegrating tablet 4 mg  4 mg Oral Q8H PRN Allen, Tina L, FNP       prazosin  (MINIPRESS ) capsule 3 mg  3 mg Oral QHS Malynn Lucy L, PA-C       traZODone  (DESYREL ) tablet 50 mg  50 mg Oral QHS PRN Dasie Ellouise CROME, FNP   50 mg at 06/08/24 2123    Lab Results:  No results found for  this or any previous visit (from the past 48 hours).   Blood Alcohol level:  Lab Results  Component Value Date   [facility] <15 06/03/2024   ETH <10 09/16/2022    Metabolic Disorder Labs: Lab Results  Component Value Date   HGBA1C 4.9 07/04/2022   MPG 93.93 07/04/2022   MPG 93.93 11/10/2021    No results found for: PROLACTIN Lab Results  Component Value Date   CHOL 150 07/04/2022   TRIG 106 07/04/2022   HDL 50 07/04/2022   CHOLHDL 3.0 07/04/2022   VLDL 21 07/04/2022   LDLCALC 79 07/04/2022   LDLCALC 55 11/10/2021    Physical Findings: AIMS:  , ,  ,  ,    CIWA:    COWS:      Psychiatric Specialty Exam:  Presentation  General Appearance:  Appropriate for Environment  Eye Contact: Fair  Speech: Clear and Coherent  Speech Volume: Normal    Mood and Affect  Mood: Depressed  Affect: Congruent   Thought Process  Thought Processes: Coherent; Linear; Goal Directed  Orientation:Full (Time, Place and Person)  Thought Content:Logical  Hallucinations: Auditory  Ideas of Reference:None  Suicidal Thoughts: Denies  Homicidal Thoughts: Denies    Sensorium  Memory: Immediate Fair; Recent Fair; Remote Fair  Judgment: Impaired  Insight: Poor   Art Therapist  Concentration: Fair  Attention Span: Fair  Recall: [employer] of Knowledge: Fair  Language: Fair   Psychomotor Activity  Psychomotor Activity: Normal  Musculoskeletal: Strength & Muscle Tone: within normal limits Gait & Station: normal Assets  Assets: Manufacturing Systems Engineer; Desire for Improvement    Physical Exam: Physical Exam ROS Blood pressure 125/77, pulse 74, temperature 97.6 F (36.4 C), temperature source Oral, resp. rate 18, height 5' 7 (1.702 m), weight 97.5 kg, SpO2 99%. Body mass index is 33.67 kg/m.  Diagnosis: Principal Problem:   MDD (major depressive disorder), recurrent episode, with atypical features   Treatment Plan Summary:  Safety and Monitoring:             -- Voluntary admission to inpatient psychiatric unit for safety, stabilization and treatment             -- Daily contact with patient to assess and evaluate symptoms and progress in treatment             -- Patient's case to be discussed in multi-disciplinary team  meeting             -- Observation Level: q15 minute checks             -- Vital signs:  q12 hours             -- Precautions: suicide, elopement, and assault   2. Psychiatric Diagnoses and Treatment:  Continue Zyprexa  7.5 mg two times daily  Patient wants to take Seroquel  as mood stabilizer - discussed cannot take both Zyprexa  and Seroquel , patient and provider agreed on Zyprexa  at this time  Increase prazosin  to 3 mg nightly              -- The risks/benefits/side-effects/alternatives to this medication were discussed in detail with the patient and time was given for questions. The patient consents to medication trial.                -- Metabolic profile and EKG monitoring obtained while on an atypical antipsychotic (BMI: Lipid Panel: HbgA1c: QTc:)              -- Encouraged patient  to participate in unit milieu and in scheduled group therapies                            3. Medical Issues Being Addressed:  -No acute concerns at this time     4. Discharge Planning:   -- Social work and case management to assist with discharge planning and identification of hospital follow-up needs prior to discharge  -- Estimated LOS: 5-7 days Case discussed with attending physician, Dr. Hellen, who is in agreement with current plan. Camelia LITTIE Lukes, PA-C 06/09/2024, 3:30 PM

## 2024-06-09 NOTE — Plan of Care (Signed)
  Problem: Education: Goal: Knowledge of [facility] Education information/materials will improve Outcome: Progressing   Problem: Education: Goal: Emotional status will improve Outcome: Progressing   Problem: Education: Goal: Mental status will improve Outcome: Progressing   

## 2024-06-09 NOTE — Plan of Care (Signed)
  Problem: Education: Goal: Knowledge of [place] [facility] Education information/materials will improve Outcome: Progressing Goal: Emotional status will improve Outcome: Progressing Goal: Mental status will improve Outcome: Progressing   Problem: Activity: Goal: Interest or engagement in activities will improve Outcome: Progressing   Problem: Safety: Goal: Periods of time without injury will increase Outcome: Progressing

## 2024-06-09 NOTE — Progress Notes (Signed)
" °   06/08/24 2000  Psych Admission Type (Psych Patients Only)  Admission Status Voluntary  Psychosocial Assessment  Patient Complaints Anxiety  Eye Contact Brief  Facial Expression Flat  Affect Apprehensive  Speech Logical/coherent  Interaction Assertive  Motor Activity Slow  Appearance/Hygiene Improved  Behavior Characteristics Cooperative;Appropriate to situation  Mood Anxious  Thought Process  Coherency WDL  Content WDL  Delusions WDL;None reported or observed  Perception WDL  Hallucination None reported or observed  Judgment WDL  Confusion WDL  Danger to Self  Current suicidal ideation? Denies  Agreement Not to Harm Self Yes  Danger to Others  Danger to Others None reported or observed   Patient alert and oriented x 4, affect is flat but brightens upon approach, he denies SI/HI/AVH, thoughts are organized and coherent  interacting appropriately with peers and staff.  "

## 2024-06-09 NOTE — Group Note (Signed)
 Date:  06/09/2024 Time:  5:19 AM  Group Topic/Focus:  Goals Group:   The focus of this group is to help patients establish daily goals to achieve during treatment and discuss how the patient can incorporate goal setting into their daily lives to aide in recovery.    Pt did not attend group. Pt mentioned that he was agitated. Pt wanted to be alone.   Tonie Vizcarrondo L 06/09/2024, 5:19 AM

## 2024-06-09 NOTE — Progress Notes (Signed)
" °   06/09/24 1000  Psych Admission Type (Psych Patients Only)  Admission Status Voluntary  Psychosocial Assessment  Patient Complaints Sleep disturbance ( get up every hour for nightmares.)  Eye Contact Fair  Facial Expression Flat  Affect Appropriate to circumstance  Speech Logical/coherent  Interaction Assertive  Motor Activity Slow  Appearance/Hygiene Unremarkable  Behavior Characteristics Cooperative;Appropriate to situation  Mood Anxious  Thought Process  Coherency WDL  Content WDL  Delusions None reported or observed  Perception WDL  Hallucination Auditory ( on & off.')  Judgment Impaired  Confusion None  Danger to Self  Current suicidal ideation? Denies  Agreement Not to Harm Self Yes  Description of Agreement verbal  Danger to Others  Danger to Others None reported or observed    "

## 2024-06-10 DIAGNOSIS — R45851 Suicidal ideations: Secondary | ICD-10-CM | POA: Diagnosis not present

## 2024-06-10 DIAGNOSIS — F4310 Post-traumatic stress disorder, unspecified: Secondary | ICD-10-CM

## 2024-06-10 DIAGNOSIS — F603 Borderline personality disorder: Secondary | ICD-10-CM | POA: Diagnosis not present

## 2024-06-10 MED ORDER — LITHIUM CARBONATE 300 MG PO CAPS
300.0000 mg | ORAL_CAPSULE | Freq: Two times a day (BID) | ORAL | Status: DC
  Administered 2024-06-11 – 2024-06-15 (×9): 300 mg via ORAL
  Filled 2024-06-10 (×9): qty 1

## 2024-06-10 MED ORDER — OLANZAPINE 10 MG PO TABS
10.0000 mg | ORAL_TABLET | Freq: Two times a day (BID) | ORAL | Status: DC
  Administered 2024-06-11 – 2024-06-15 (×10): 10 mg via ORAL
  Filled 2024-06-10 (×10): qty 1

## 2024-06-10 MED ORDER — OMEGA-3-ACID ETHYL ESTERS 1 G PO CAPS
1.0000 g | ORAL_CAPSULE | Freq: Two times a day (BID) | ORAL | Status: DC
  Administered 2024-06-10 – 2024-06-18 (×16): 1 g via ORAL
  Filled 2024-06-10 (×17): qty 1

## 2024-06-10 NOTE — Group Note (Signed)
 Date:  06/10/2024 Time:  8:32 PM  Group Topic/Focus:  Wrap-Up Group:   The focus of this group is to help patients review their daily goal of treatment and discuss progress on daily workbooks.    Participation Level:  Did Not Attend  Participation Quality:  none  Affect:  none  Cognitive:  none  Insight: None  Engagement in Group:  None  Modes of Intervention:  none  Additional Comments:    Ginny JONETTA Galeazzi 06/10/2024, 8:32 PM
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
## 2024-06-10 NOTE — Group Note (Signed)
 Recreation Therapy Group Note   Group Topic:Coping Skills  Group Date: 06/10/2024 Start Time: 1530 End Time: 1605 Facilitators: Celestia Jeoffrey BRAVO, LRT, CTRS Location: Dayroom  Group Description: Meditation. LRT and patients discussed what they know about meditation and mindfulness. LRT played a Deep Breathing Meditation exercise script for patients to follow along to. LRT and patients discussed how meditation and deep breathing can be used as a coping skill post--discharge to help manage symptoms of stress.   Goal Area(s) Addressed: Patient will practice using relaxation technique. Patient will identify a new coping skill.  Patient will follow multistep directions to reduce anxiety and stress.   Affect/Mood: N/A   Participation Level: Did not attend    Clinical Observations/Individualized Feedback: Patient did not attend.  Plan: Continue to engage patient in RT group sessions 2-3x/week.   Jeoffrey BRAVO Celestia, LRT, CTRS 06/10/2024 5:06 PM

## 2024-06-10 NOTE — Plan of Care (Signed)
 Patient stated that he is feeling better than he thought this morning.Patient denies SI,HI and AVH at this time. Patient states the suicidal thoughts  comes and goes. Patient continues with 1:1 monitoring. Vistaril  x 2 given with better result.

## 2024-06-10 NOTE — Group Note (Signed)
 Date:  06/10/2024 Time:  3:49 AM   Additional Comments:  Did not attend group.  Jon Clayton 06/10/2024, 3:49 AM

## 2024-06-10 NOTE — Group Note (Signed)
 [facility] LCSW Group Therapy Note   Group Date: 06/10/2024 Start Time: 1300 End Time: 1400   Type of Therapy/Topic:  Group Therapy:  Emotion Regulation  Participation Level:  Did Not Attend   Mood:  Description of Group:    The purpose of this group is to assist patients in learning to regulate negative emotions and experience positive emotions. Patients will be guided to discuss ways in which they have been vulnerable to their negative emotions. These vulnerabilities will be juxtaposed with experiences of positive emotions or situations, and patients challenged to use positive emotions to combat negative ones. Special emphasis will be placed on coping with negative emotions in conflict situations, and patients will process healthy conflict resolution skills.  Therapeutic Goals: Patient will identify two positive emotions or experiences to reflect on in order to balance out negative emotions:  Patient will label two or more emotions that they find the most difficult to experience:  Patient will be able to demonstrate positive conflict resolution skills through discussion or role plays:   Summary of Patient Progress:   Patient did not attend.     Therapeutic Modalities:   Cognitive Behavioral Therapy Feelings Identification Dialectical Behavioral Therapy   Alveta CHRISTELLA Kerns, LCSW

## 2024-06-10 NOTE — Progress Notes (Addendum)
 Patient   06/10/24 1000  Psych Admission Type (Psych Patients Only)  Admission Status Voluntary  Psychosocial Assessment  Patient Complaints Anxiety;Other (Comment) (Verbalized suicidal ideation.Stated that he had a plan to hang himself last night.)  Eye Contact Fair  Facial Expression Sad  Affect Sad  Speech Logical/coherent  Interaction Assertive  Motor Activity Slow  Appearance/Hygiene Unremarkable  Behavior Characteristics Cooperative;Appropriate to situation  Mood Depressed;Pleasant  Thought Process  Coherency WDL  Content WDL  Delusions None reported or observed  Perception WDL  Hallucination None reported or observed  Judgment Impaired  Confusion None  Danger to Self  Current suicidal ideation? Active  Agreement Not to Harm Self No  Description of Agreement Patient states  I don't know.  Danger to Others  Danger to Others None reported or observed   Patient is monitoring with 1: 1 sitter.

## 2024-06-10 NOTE — Group Note (Signed)
 Date:  06/10/2024 Time:  1:05 PM  Group Topic/Focus:  Building Self Esteem:   The Focus of this group is helping patients become aware of the effects of self-esteem on their lives, the things they and others do that enhance or undermine their self-esteem, seeing the relationship between their level of self-esteem and the choices they make and learning ways to enhance self-esteem. Emotional Education:   The focus of this group is to discuss what feelings/emotions are, and how they are experienced.    Participation Level:  Did Not Attend    Jon Clayton June 06/10/2024, 1:05 PM

## 2024-06-10 NOTE — Progress Notes (Signed)
 Patient on 1:1 for safety with a sitter present. Pt is calm and cooperative. Pt states, I don't know why I feel like this. Pt compliant with medication administration per MD orders. Pt remains safe on the unit, will continue to monitor

## 2024-06-10 NOTE — [facility] Counselor (Signed)
 CSW met with pt briefly to inform him that [facility] will not have any bed availability for males until the 20th. He asked if it were possible for him to stay and wait for a bed from them. CSW explained that it is highly unlikely. Pt shared that he would be ok with going to the [facility] while waiting for bed at [facility]. Pt stated that their beds turn over quickly and it might not take that long. He asked the CSW to try to get in touch with [facility] who is over the halfway house. CSW informed him that he would try. No other concerns expressed. Contact ended without incident.   CSW contact [facility] [phone]) and was given the contact information for [facility] over the male halfway house.   CSW attempted contact with [facility] [phone]). Contact was unable to be established but HIPAA compliant voicemail was left with contact information for follow through.   CSW met with pt briefly per request. He stated that he was wondering if he could be sent to the [facility] in [place] from here. CSW informed him that he was not certain but he would check on this. No other concerns expressed. Contact ended without incident.   Nadara SAUNDERS. Chaim, MSW, LCSW, LCAS 06/10/2024 3:51 PM

## 2024-06-10 NOTE — Progress Notes (Signed)
" °   06/09/24 2000  Psych Admission Type (Psych Patients Only)  Admission Status Voluntary  Psychosocial Assessment  Patient Complaints Sleep disturbance  Eye Contact Brief  Facial Expression Flat  Affect Apprehensive  Speech Logical/coherent  Interaction Assertive  Motor Activity Slow  Appearance/Hygiene Improved  Behavior Characteristics Cooperative;Appropriate to situation  Mood Pleasant;Anxious  Thought Process  Coherency WDL  Content WDL  Delusions WDL;None reported or observed  Perception WDL  Hallucination None reported or observed  Judgment WDL  Confusion WDL  Danger to Self  Current suicidal ideation? Denies  Agreement Not to Harm Self Yes  Danger to Others  Danger to Others None reported or observed   Patient is alert and oriented x 3, thoughts are organized, he appears anxious and complained of restlessness and agitation. Patient was medicated with agitation protocol and it was effective., patient was offered emotional support. 16 minutes safety checks maintained.  "

## 2024-06-10 NOTE — BH IP Treatment Plan (Signed)
 Interdisciplinary Treatment and Diagnostic Plan Update  06/10/2024 Time of Session: 14:00 Jon Clayton MRN: [id number]  Principal Diagnosis: MDD (major depressive disorder), recurrent episode, with atypical features  Secondary Diagnoses: Principal Problem:   MDD (major depressive disorder), recurrent episode, with atypical features   Current Medications:  Current Facility-Administered Medications  Medication Dose Route Frequency Provider Last Rate Last Admin   acetaminophen  (TYLENOL ) tablet 650 mg  650 mg Oral Q6H PRN Dasie Ellouise CROME, FNP   650 mg at 06/05/24 2023   albuterol  (VENTOLIN  HFA) 108 (90 Base) MCG/ACT inhaler 2 puff  2 puff Inhalation Q6H PRN Dasie Ellouise CROME, FNP       alum & mag hydroxide-simeth (MAALOX/MYLANTA) 200-200-20 MG/5ML suspension 30 mL  30 mL Oral Q4H PRN Allen, Tina L, FNP       haloperidol  (HALDOL ) tablet 5 mg  5 mg Oral TID PRN Dasie Ellouise CROME, FNP   5 mg at 06/08/24 9781   And   diphenhydrAMINE  (BENADRYL ) capsule 50 mg  50 mg Oral TID PRN Allen, Tina L, FNP   50 mg at 06/08/24 9781   haloperidol  lactate (HALDOL ) injection 5 mg  5 mg Intramuscular TID PRN Dasie Ellouise CROME, FNP       And   diphenhydrAMINE  (BENADRYL ) injection 50 mg  50 mg Intramuscular TID PRN Dasie Ellouise CROME, FNP       And   LORazepam  (ATIVAN ) injection 2 mg  2 mg Intramuscular TID PRN Dasie Ellouise CROME, FNP       haloperidol  lactate (HALDOL ) injection 10 mg  10 mg Intramuscular TID PRN Dasie Ellouise CROME, FNP       And   diphenhydrAMINE  (BENADRYL ) injection 50 mg  50 mg Intramuscular TID PRN Dasie Ellouise CROME, FNP       And   LORazepam  (ATIVAN ) injection 2 mg  2 mg Intramuscular TID PRN Allen, Tina L, FNP       hydrOXYzine  (ATARAX ) tablet 25 mg  25 mg Oral TID PRN Allen, Tina L, FNP   25 mg at 06/10/24 1456   magnesium  hydroxide (MILK OF MAGNESIA) suspension 30 mL  30 mL Oral Daily PRN Dasie Ellouise CROME, FNP   30 mL at 06/10/24 9191   nicotine  polacrilex (NICORETTE ) gum 2 mg  2 mg Oral PRN Jadapalle, Sree, MD   2 mg  at 06/04/24 1416   OLANZapine  (ZYPREXA ) tablet 7.5 mg  7.5 mg Oral BID Shrivastava, Aryendra, MD   7.5 mg at 06/10/24 0805   ondansetron  (ZOFRAN -ODT) disintegrating tablet 4 mg  4 mg Oral Q8H PRN Dasie Ellouise CROME, FNP       prazosin  (MINIPRESS ) capsule 3 mg  3 mg Oral QHS Hunter, Crystal L, PA-C   3 mg at 06/09/24 2135   traZODone  (DESYREL ) tablet 50 mg  50 mg Oral QHS PRN Allen, Tina L, FNP   50 mg at 06/10/24 0048   PTA Medications: Medications Prior to Admission  Medication Sig Dispense Refill Last Dose/Taking   albuterol  (VENTOLIN  HFA) 108 (90 Base) MCG/ACT inhaler Inhale 2 puffs into the lungs every 6 (six) hours as needed for wheezing or shortness of breath. 6.7 g 0    OLANZapine  (ZYPREXA ) 5 MG tablet Take 1 PO QAM and 3 PO QHS (Patient not taking: Reported on 06/03/2024) 120 tablet 1    prazosin  (MINIPRESS ) 2 MG capsule Take 1 capsule (2 mg total) by mouth at bedtime. (Patient not taking: Reported on 06/03/2024) 30 capsule 1     Patient Stressors: Surveyor, Quantity  difficulties   Health problems   Medication change or noncompliance   Occupational concerns   Substance abuse   Traumatic event    Patient Strengths: Ability for insight  Average or above average intelligence  Communication skills  General fund of knowledge  Motivation for treatment/growth   Treatment Modalities: Medication Management, Group therapy, Case management,  1 to 1 session with clinician, Psychoeducation, Recreational therapy.   Physician Treatment Plan for Primary Diagnosis: MDD (major depressive disorder), recurrent episode, with atypical features Long Term Goal(s): Improvement in symptoms so as ready for discharge   Short Term Goals: Ability to identify changes in lifestyle to reduce recurrence of condition will improve Ability to verbalize feelings will improve Ability to disclose and discuss suicidal ideas Ability to demonstrate self-control will improve Ability to identify and develop effective coping  behaviors will improve Ability to maintain clinical measurements within normal limits will improve Compliance with prescribed medications will improve Ability to identify triggers associated with substance abuse/mental health issues will improve  Medication Management: Evaluate patient's response, side effects, and tolerance of medication regimen.  Therapeutic Interventions: 1 to 1 sessions, Unit Group sessions and Medication administration.  Evaluation of Outcomes: Progressing  Physician Treatment Plan for Secondary Diagnosis: Principal Problem:   MDD (major depressive disorder), recurrent episode, with atypical features  Long Term Goal(s): Improvement in symptoms so as ready for discharge   Short Term Goals: Ability to identify changes in lifestyle to reduce recurrence of condition will improve Ability to verbalize feelings will improve Ability to disclose and discuss suicidal ideas Ability to demonstrate self-control will improve Ability to identify and develop effective coping behaviors will improve Ability to maintain clinical measurements within normal limits will improve Compliance with prescribed medications will improve Ability to identify triggers associated with substance abuse/mental health issues will improve     Medication Management: Evaluate patient's response, side effects, and tolerance of medication regimen.  Therapeutic Interventions: 1 to 1 sessions, Unit Group sessions and Medication administration.  Evaluation of Outcomes: Progressing   RN Treatment Plan for Primary Diagnosis: MDD (major depressive disorder), recurrent episode, with atypical features Long Term Goal(s): Knowledge of disease and therapeutic regimen to maintain health will improve  Short Term Goals: Ability to remain free from injury will improve, Ability to verbalize frustration and anger appropriately will improve, Ability to demonstrate self-control, Ability to participate in decision making  will improve, Ability to verbalize feelings will improve, Ability to disclose and discuss suicidal ideas, Ability to identify and develop effective coping behaviors will improve, and Compliance with prescribed medications will improve  Medication Management: RN will administer medications as ordered by provider, will assess and evaluate patient's response and provide education to patient for prescribed medication. RN will report any adverse and/or side effects to prescribing provider.  Therapeutic Interventions: 1 on 1 counseling sessions, Psychoeducation, Medication administration, Evaluate responses to treatment, Monitor vital signs and CBGs as ordered, Perform/monitor CIWA, COWS, AIMS and Fall Risk screenings as ordered, Perform wound care treatments as ordered.  Evaluation of Outcomes: Progressing   LCSW Treatment Plan for Primary Diagnosis: MDD (major depressive disorder), recurrent episode, with atypical features Long Term Goal(s): Safe transition to appropriate next level of care at discharge, Engage patient in therapeutic group addressing interpersonal concerns.  Short Term Goals: Engage patient in aftercare planning with referrals and resources, Increase social support, Increase ability to appropriately verbalize feelings, Increase emotional regulation, Facilitate acceptance of mental health diagnosis and concerns, Facilitate patient progression through stages of change regarding substance use  diagnoses and concerns, Identify triggers associated with mental health/substance abuse issues, and Increase skills for wellness and recovery  Therapeutic Interventions: Assess for all discharge needs, 1 to 1 time with Social worker, Explore available resources and support systems, Assess for adequacy in community support network, Educate family and significant other(s) on suicide prevention, Complete Psychosocial Assessment, Interpersonal group therapy.  Evaluation of Outcomes:  Progressing   Progress in Treatment: Attending groups: No. Participating in groups: No. Taking medication as prescribed: Yes. Toleration medication: Yes. Family/Significant other contact made: No, will contact:  if given permission.  Patient understands diagnosis: Yes. Discussing patient identified problems/goals with staff: Yes. Medical problems stabilized or resolved: Yes. Denies suicidal/homicidal ideation: No. Issues/concerns per patient self-inventory: No. Other: none.   New problem(s) identified: No, Describe:  none identified. 06/10/24 Update: No changes at this time.    New Short Term/Long Term Goal(s): elimination of symptoms of psychosis, medication management for mood stabilization; elimination of SI thoughts; development of comprehensive mental wellness/sobriety plan. 06/10/24 Update: No changes at this time.   Patient Goals:  Get back on my medicine and I'd like to go to [facility] or [facility] from here. 06/10/24 Update: No changes at this time.   Discharge Plan or Barriers: CSW will assist pt with development of an appropriate aftercare/discharge plan. 06/10/24 Update: No changes at this time.   Reason for Continuation of Hospitalization: Depression Hallucinations Medication stabilization Suicidal ideation   Estimated Length of Stay: 1-7 days 06/10/24 Update: TBD.  Last 3 [place] Suicide Severity Risk Score: Flowsheet Row Admission (Current) from 06/03/2024 in [facility] Most recent reading at 06/03/2024  4:12 PM ED from 06/03/2024 in [facility] Most recent reading at 06/03/2024  3:09 AM ED from 03/28/2023 in [facility] Emergency Department at [facility] Most recent reading at 03/28/2023  2:18 PM  C-SSRS RISK CATEGORY High Risk High Risk No Risk    Last PHQ 2/9 Scores:    09/21/2022    8:28 AM 09/20/2022   10:21 PM 09/19/2022    5:57 PM  Depression screen PHQ 2/9  Decreased  Interest 2 2 3   Down, Depressed, Hopeless 2 2 2   PHQ - 2 Score 4 4 5   Altered sleeping 2 2 3   Tired, decreased energy 2 2 2   Change in appetite   3  Feeling bad or failure about yourself   3 3  Trouble concentrating  2 2  Moving slowly or fidgety/restless  2 3  Suicidal thoughts  2 3  PHQ-9 Score 8  17  24    Difficult doing work/chores   Very difficult     Data saved with a previous flowsheet row definition    Scribe for Treatment Team: Nadara JONELLE Fam, LCSW 06/10/2024 4:07 PM

## 2024-06-11 DIAGNOSIS — R45851 Suicidal ideations: Secondary | ICD-10-CM | POA: Diagnosis not present

## 2024-06-11 DIAGNOSIS — F4310 Post-traumatic stress disorder, unspecified: Secondary | ICD-10-CM | POA: Diagnosis not present

## 2024-06-11 DIAGNOSIS — F603 Borderline personality disorder: Secondary | ICD-10-CM | POA: Diagnosis not present

## 2024-06-11 MED ORDER — PRAZOSIN HCL 2 MG PO CAPS
4.0000 mg | ORAL_CAPSULE | Freq: Every day | ORAL | Status: DC
  Administered 2024-06-11: 4 mg via ORAL
  Filled 2024-06-11: qty 2

## 2024-06-11 NOTE — Progress Notes (Signed)
 Pt is on 1:1 for safety with a sitter present. Pt is calm and cooperative at this time. Will continue to monitor

## 2024-06-11 NOTE — Group Note (Signed)
 Date:  06/11/2024 Time:  9:10 PM  Group Topic/Focus:  Healthy Communication:   The focus of this group is to discuss communication, barriers to communication, as well as healthy ways to communicate with others. Wrap-Up Group:   The focus of this group is to help patients review their daily goal of treatment and discuss progress on daily workbooks.    Participation Level:  Minimal  Participation Quality:  Appropriate and Attentive  Affect:  Appropriate  Cognitive:  Alert and Appropriate  Insight: Appropriate and Good  Engagement in Group:  Engaged  Modes of Intervention:  Discussion  Additional Comments:     Arlester CHRISTELLA Servant 06/11/2024, 9:10 PM

## 2024-06-11 NOTE — Progress Notes (Signed)
 12:00  - Patient ate lunch and is med compliant. Patient denies any pain at this time. Patient denies current SI, HI, and A/V/H with no plan or intent. 1:1 sitter by side. No s/s of current distress.   1300 - Patient observed resting in room with no s/s of current distress. Sitter remains by side.  1400 - Patient without any concerns, sitter remains by side.  1500 - Patient without any concerns, sitter remains by side.  1600 - Patient without any concerns, sitter remains by side.  1700- Patient ate dinner and is med compliant. 1:1 sitter by side. No s/s of current distress.  1800 - Patient without any concerns, request assist with laundry. sitter remains by side.  1900 - Patient without any concerns, denies current SI, HI, and A/V/H with no plan or intent. sitter remains by side.

## 2024-06-11 NOTE — Progress Notes (Addendum)
 08:34am - Patient ate breakfast and is med compliant. Patient denies any pain at this time. Patient denies current SI,HI, and A/V/H with no plan or intent but states he is not sure if he is ready to be off 1:1 monitoring as his thoughts are very spontaneous. Provider notified and patient remains safe on unit with 1:1 sitter by side. No s/s of current distress.  0930am- Patient observed resting in room with no s/s of current distress. Sitter remains by side.

## 2024-06-11 NOTE — Group Note (Signed)
 Recreation Therapy Group Note   Group Topic:Goal Setting  Group Date: 06/11/2024 Start Time: 1000 End Time: 1100 Facilitators: Celestia Jeoffrey BRAVO, LRT, CTRS Location: Craft Room  Group Description: Product/process Development Scientist. Patients were given many different magazines, a glue stick, markers, and a piece of cardstock paper. LRT and pts discussed the importance of having goals in life. LRT and pts discussed the difference between short-term and long-term goals, as well as what a SMART goal is. LRT encouraged pts to create a vision board, with images they picked and then cut out with safety scissors from the magazine, for themselves, that capture their short and long-term goals. LRT encouraged pts to show and explain their vision board to the group.   Goal Area(s) Addressed:  Patient will gain knowledge of short vs. long term goals.  Patient will identify goals for themselves. Patient will practice setting SMART goals. Patient will verbalize their goals to LRT and peers.   Affect/Mood: N/A   Participation Level: Did not attend    Clinical Observations/Individualized Feedback: Patient did not attend.  Plan: Continue to engage patient in RT group sessions 2-3x/week.   Jeoffrey BRAVO Celestia, LRT, CTRS 06/11/2024 11:07 AM

## 2024-06-11 NOTE — Progress Notes (Signed)
 Pt on 1:1 for safety with a sitter present. Pt stated to this writer he was feeling better than he has been but couldn't contract for safety. Pt compliant with medication administration per MD orders. Pt given education, support, and encouragement to be active in his treatment plan.

## 2024-06-11 NOTE — Group Note (Signed)
 [facility] LCSW Group Therapy Note   Group Date: 06/11/2024 Start Time: 1315 End Time: 1400  Type of Therapy/Topic:  Group Therapy:  Feelings about Diagnosis  Participation Level:  Did Not Attend   Description of Group:    This group will allow patients to explore their thoughts and feelings about diagnoses they have received. Patients will be guided to explore their level of understanding and acceptance of these diagnoses. Facilitator will encourage patients to process their thoughts and feelings about the reactions of others to their diagnosis, and will guide patients in identifying ways to discuss their diagnosis with significant others in their lives. This group will be process-oriented, with patients participating in exploration of their own experiences as well as giving and receiving support and challenge from other group members.   Therapeutic Goals: 1. Patient will demonstrate understanding of diagnosis as evidence by identifying two or more symptoms of the disorder:  2. Patient will be able to express two feelings regarding the diagnosis 3. Patient will demonstrate ability to communicate their needs through discussion and/or role plays  Summary of Patient Progress: Patient declined to attend group.   Therapeutic Modalities:   Cognitive Behavioral Therapy Brief Therapy Feelings Identification    Sherryle JINNY Margo, LCSW

## 2024-06-11 NOTE — Group Note (Signed)
 Recreation Therapy Group Note   Group Topic:Coping Skills  Group Date: 06/11/2024 Start Time: 1530 End Time: 1605 Facilitators: Celestia Jeoffrey FORBES ARTICE, CTRS Location: Craft Room  Group Description: Coping A-Z. LRT and patients engage in a guided discussion on what coping skills are and gave specific examples. LRT passed out a handout labeled Coping A-Z with blank spaces beside each letter. LRT prompted patients to come up with a coping skill for each of the letters. LRT and patients went over the handout and gave ideas for each letter if anyone had any blanks left on their paper. Patients kept this handout with them that listed 26 different coping skills.   Goal Area(s) Addressed: Patients will be able to define coping skills. Patient will identify new coping skills.  Patient will increase communication.   Affect/Mood: N/A   Participation Level: Did not attend    Clinical Observations/Individualized Feedback: Patient did not attend.  Plan: Continue to engage patient in RT group sessions 2-3x/week.   [id number], LRT, CTRS 06/11/2024 4:10 PM

## 2024-06-11 NOTE — Progress Notes (Signed)
 Pt on 1:1 for safety with a sitter present. Pt is asleep and without concern at this time.
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
## 2024-06-11 NOTE — Plan of Care (Signed)
" °  Problem: Education: Goal: Emotional status will improve Outcome: Progressing   Problem: Coping: Goal: Ability to demonstrate self-control will improve Outcome: Progressing   Problem: Physical Regulation: Goal: Ability to maintain clinical measurements within normal limits will improve Outcome: Progressing   Problem: Safety: Goal: Periods of time without injury will increase Outcome: Progressing   "

## 2024-06-11 NOTE — Plan of Care (Signed)
   Problem: Education: Goal: Emotional status will improve Outcome: Not Progressing Goal: Mental status will improve Outcome: Not Progressing

## 2024-06-12 DIAGNOSIS — F339 Major depressive disorder, recurrent, unspecified: Secondary | ICD-10-CM | POA: Diagnosis not present

## 2024-06-12 MED ORDER — PRAZOSIN HCL 2 MG PO CAPS
4.0000 mg | ORAL_CAPSULE | Freq: Every day | ORAL | Status: DC
  Administered 2024-06-12: 4 mg via ORAL
  Filled 2024-06-12: qty 2

## 2024-06-12 NOTE — Group Note (Signed)
 [facility] LCSW Group Therapy Note   Group Date: 06/12/2024 Start Time: 1300 End Time: 1340   Type of Therapy/Topic:  Group Therapy:  Emotion Regulation  Participation Level:  Did Not Attend   Description of Group:    The purpose of this group is to assist patients in learning to regulate negative emotions and experience positive emotions. Patients will be guided to discuss ways in which they have been vulnerable to their negative emotions. These vulnerabilities will be juxtaposed with experiences of positive emotions or situations, and patients challenged to use positive emotions to combat negative ones. Special emphasis will be placed on coping with negative emotions in conflict situations, and patients will process healthy conflict resolution skills.  Therapeutic Goals: Patient will identify two positive emotions or experiences to reflect on in order to balance out negative emotions:  Patient will label two or more emotions that they find the most difficult to experience:  Patient will be able to demonstrate positive conflict resolution skills through discussion or role plays:   Summary of Patient Progress: Patient did not attend group.   Therapeutic Modalities:   Cognitive Behavioral Therapy Feelings Identification Dialectical Behavioral Therapy   Nadara JONELLE Fam, LCSW

## 2024-06-12 NOTE — Group Note (Signed)
 Date:  06/12/2024 Time:  12:43 PM  Group Topic/Focus:  Personal Choices and Values:   The focus of this group is to help patients assess and explore the importance of values in their lives, how their values affect their decisions, how they express their values and what opposes their expression.    Participation Level:  Did Not Attend   Jon Clayton [name] June 06/12/2024, 12:43 PM

## 2024-06-12 NOTE — Plan of Care (Signed)
  Problem: Education: Goal: Emotional status will improve Outcome: Progressing Goal: Mental status will improve Outcome: Progressing Goal: Verbalization of understanding the information provided will improve Outcome: Progressing   Problem: Coping: Goal: Ability to verbalize frustrations and anger appropriately will improve Outcome: Progressing Goal: Ability to demonstrate self-control will improve Outcome: Progressing   Problem: Health Behavior/Discharge Planning: Goal: Compliance with treatment plan for underlying cause of condition will improve Outcome: Progressing   Problem: Activity: Goal: Interest or engagement in activities will improve Outcome: Not Progressing Goal: Sleeping patterns will improve Outcome: Not Progressing

## 2024-06-12 NOTE — Group Note (Signed)
 Date:  06/12/2024 Time:  4:12 PM  Group Topic/Focus:  Self Care:   The focus of this group is to help patients understand the importance of self-care in order to improve or restore emotional, physical, spiritual, interpersonal, and financial health.    Participation Level:  Did Not Attend   Jon Clayton [name] June 06/12/2024, 4:12 PM

## 2024-06-12 NOTE — Progress Notes (Signed)
 Pt on 1:1 for safety with a sitter present. Pt is asleep and without concern at this time.

## 2024-06-12 NOTE — Progress Notes (Signed)
" °   06/12/24 0926  Psych Admission Type (Psych Patients Only)  Admission Status Voluntary  Psychosocial Assessment  Patient Complaints Sleep disturbance  Eye Contact Fair  Facial Expression Flat  Affect Flat  Speech Logical/coherent  Interaction Assertive  Motor Activity Slow  Appearance/Hygiene Unremarkable;In scrubs  Behavior Characteristics Cooperative  Mood Depressed  Aggressive Behavior  Effect No apparent injury  Thought Process  Coherency WDL  Content WDL  Delusions None reported or observed  Perception WDL  Hallucination None reported or observed  Judgment Poor  Confusion WDL  Danger to Self  Current suicidal ideation? Denies  Self-Injurious Behavior No self-injurious ideation or behavior indicators observed or expressed   Agreement Not to Harm Self Yes  Description of Agreement verbal  Danger to Others  Danger to Others None reported or observed    "

## 2024-06-12 NOTE — [facility] Counselor (Signed)
 CSW contacted [facility]. CSW was informed that they cannot accept someone on the registry due to being close to a school.   CSW contacted [facility] and spoke with [facility] regarding bed availability. She shared that no male beds would be available until the end of the month. She stated that CSW could try calling back on 06/18/24 around 10:30 AM to see if pt showed up for their bed as they were a self admit. No other concerns expressed. Contact ended without incident.   Nadara SAUNDERS. Chaim, MSW, LCSW, LCAS 06/12/2024 3:43 PM

## 2024-06-12 NOTE — Plan of Care (Signed)
   Problem: Education: Goal: Emotional status will improve Outcome: Progressing Goal: Mental status will improve Outcome: Progressing
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
## 2024-06-13 MED ORDER — PRAZOSIN HCL 2 MG PO CAPS
2.0000 mg | ORAL_CAPSULE | Freq: Every day | ORAL | Status: DC
  Administered 2024-06-14 – 2024-06-17 (×4): 2 mg via ORAL
  Filled 2024-06-13 (×5): qty 1

## 2024-06-13 NOTE — Plan of Care (Signed)

## 2024-06-13 NOTE — Group Note (Signed)
 Recreation Therapy Group Note   Group Topic:Communication  Group Date: 06/13/2024 Start Time: 1410 End Time: 1445 Facilitators: Celestia Jeoffrey BRAVO, LRT, CTRS Location: Craft Room  Group Description: Resolution Charades. Each group member writes down a New Years resolution theyre considering (e.g., spend more time outdoors or practice mindfulness) onto a small slip of paper. Resolutions are folded and placed in a bowl. One person picks a resolution and acts it out without words while the group guesses. Once the resolution is guessed, the group discusses how achieving this goal could be meaningful.  Goal Area(s) Addressed:  [employer] and self-expression. Create a supportive space for sharing personal goals. Encourage reflection on individual aspirations. Nonverbal communication. Active listening, teamwork and team building.   Affect/Mood: N/A   Participation Level: Did not attend    Clinical Observations/Individualized Feedback: Patient did not attend.  Plan: Continue to engage patient in RT group sessions 2-3x/week.   Jeoffrey BRAVO Celestia, LRT, CTRS 06/13/2024 3:54 PM

## 2024-06-13 NOTE — Group Note (Signed)
 Recreation Therapy Group Note   Group Topic:General Recreation  Group Date: 06/13/2024 Start Time: 1445 End Time: 1515 Facilitators: Celestia Jeoffrey BRAVO, LRT, CTRS Location: Courtyard  Group Description: [employer]. LRT and patients played games of basketball, drew with chalk, and played corn hole while outside in the courtyard while getting fresh air and sunlight. Music was being played in the background. LRT and peers conversed about different games they have played before, what they do in their free time and anything else that is on their minds. LRT encouraged pts to drink water  after being outside, sweating and getting their heart rate up.  Goal Area(s) Addressed: Patient will build on frustration tolerance skills. Patients will partake in a competitive play game with peers. Patients will gain knowledge of new leisure interest/hobby.    Affect/Mood: N/A   Participation Level: Did not attend    Clinical Observations/Individualized Feedback: Patient did not attend.  Plan: Continue to engage patient in RT group sessions 2-3x/week.   Jeoffrey BRAVO Celestia, LRT, CTRS 06/13/2024 4:45 PM

## 2024-06-13 NOTE — Group Note (Signed)
 [facility] LCSW Group Therapy Note   Group Date: 06/13/2024 Start Time: 1000 End Time: 1100   Type of Therapy/Topic:  Group Therapy:  Balance in Life  Participation Level:  Did Not Attend   Description of Group:    This group will address the concept of balance and how it feels and looks when one is unbalanced. Patients will be encouraged to process areas in their lives that are out of balance, and identify reasons for remaining unbalanced. Facilitators will guide patients utilizing problem- solving interventions to address and correct the stressor making their life unbalanced. Understanding and applying boundaries will be explored and addressed for obtaining  and maintaining a balanced life. Patients will be encouraged to explore ways to assertively make their unbalanced needs known to significant others in their lives, using other group members and facilitator for support and feedback.  Therapeutic Goals: Patient will identify two or more emotions or situations they have that consume much of in their lives. Patient will identify signs/triggers that life has become out of balance:  Patient will identify two ways to set boundaries in order to achieve balance in their lives:  Patient will demonstrate ability to communicate their needs through discussion and/or role plays  Summary of Patient Progress:    Patient did not attend.     Therapeutic Modalities:   Cognitive Behavioral Therapy Solution-Focused Therapy Assertiveness Training   Alveta CHRISTELLA Kerns, LCSW

## 2024-06-13 NOTE — Progress Notes (Signed)
" °   06/13/24 1008  Psych Admission Type (Psych Patients Only)  Admission Status Voluntary  Psychosocial Assessment  Patient Complaints Sleep disturbance  Eye Contact Fair  Facial Expression Flat  Affect Flat  Speech Logical/coherent  Interaction Assertive  Motor Activity Slow  Appearance/Hygiene Unremarkable  Behavior Characteristics Cooperative  Mood Depressed  Thought Process  Coherency WDL  Content WDL  Delusions None reported or observed  Perception WDL  Hallucination None reported or observed  Judgment Poor  Confusion WDL  Danger to Self  Current suicidal ideation? Denies  Self-Injurious Behavior No self-injurious ideation or behavior indicators observed or expressed   Agreement Not to Harm Self Yes  Description of Agreement verbal  Danger to Others  Danger to Others None reported or observed    "

## 2024-06-13 NOTE — Plan of Care (Signed)
   Problem: Education: Goal: Emotional status will improve Outcome: Progressing Goal: Mental status will improve Outcome: Progressing   Problem: Activity: Goal: Interest or engagement in activities will improve Outcome: Progressing

## 2024-06-13 NOTE — Progress Notes (Signed)
" °   06/13/24 0123  Psych Admission Type (Psych Patients Only)  Admission Status Voluntary  Psychosocial Assessment  Patient Complaints Sleep disturbance  Eye Contact Fair  Facial Expression Flat  Affect Flat  Speech Logical/coherent  Interaction Assertive  Motor Activity Slow  Appearance/Hygiene Unremarkable;In scrubs  Behavior Characteristics Cooperative  Mood Depressed  Aggressive Behavior  Effect No apparent injury  Thought Process  Coherency WDL  Content WDL  Delusions None reported or observed  Perception WDL  Hallucination None reported or observed  Judgment Poor  Confusion WDL  Danger to Self  Current suicidal ideation? Denies  Self-Injurious Behavior No self-injurious ideation or behavior indicators observed or expressed   Agreement Not to Harm Self Yes  Description of Agreement Verbal  Danger to Others  Danger to Others None reported or observed    "

## 2024-06-13 NOTE — Group Note (Signed)
 Date:  06/13/2024 Time:  10:42 AM  Group Topic/Focus:  MTH went over the rules and expectations on this unit for the patients, Identified who the nurses and techs were while also answering patient questions.    Participation Level:  Did Not Attend  Leigh VEAR Pais 06/13/2024, 10:42 AM

## 2024-06-13 NOTE — Group Note (Signed)
 Date:  06/13/2024 Time:  4:00 AM  Group Topic/Focus:  Goals Group:   The focus of this group is to help patients establish daily goals to achieve during treatment and discuss how the patient can incorporate goal setting into their daily lives to aide in recovery. Wrap-Up Group:   The focus of this group is to help patients review their daily goal of treatment and discuss progress on daily workbooks.    Participation Level:  Minimal  Participation Quality:  Appropriate  Affect:  Flat  Cognitive:  Lacking  Insight: None  Engagement in Group:  None  Modes of Intervention:  Support  Additional Comments:    Jillayne Witte L 06/13/2024, 4:00 AM

## 2024-06-13 NOTE — Group Note (Signed)
 Date:  06/13/2024 Time:  8:43 PM  Group Topic/Focus:  Conflict Resolution:   The focus of this group is to discuss the conflict resolution process and how it may be used upon discharge.    Participation Level:  Active  Participation Quality:  Appropriate  Affect:  Appropriate  Cognitive:  Appropriate  Insight: Appropriate  Engagement in Group:  Engaged  Modes of Intervention:  Discussion  Additional Comments:    Caston Coopersmith L 06/13/2024, 8:43 PM

## 2024-06-14 DIAGNOSIS — F4310 Post-traumatic stress disorder, unspecified: Secondary | ICD-10-CM | POA: Diagnosis not present

## 2024-06-14 DIAGNOSIS — R45851 Suicidal ideations: Secondary | ICD-10-CM | POA: Diagnosis not present

## 2024-06-14 DIAGNOSIS — F603 Borderline personality disorder: Secondary | ICD-10-CM | POA: Diagnosis not present

## 2024-06-14 NOTE — Group Note (Signed)
 Date:  06/14/2024 Time:  9:41 PM  Group Topic/Focus:  Coping With Mental Health Crisis:   The purpose of this group is to help patients identify strategies for coping with mental health crisis.  Group discusses possible causes of crisis and ways to manage them effectively. Managing Feelings:   The focus of this group is to identify what feelings patients have difficulty handling and develop a plan to handle them in a healthier way upon discharge.    Pt did not attend group.  Jon Clayton L 06/14/2024, 9:41 PM

## 2024-06-14 NOTE — Progress Notes (Signed)
" °   06/14/24 1230  Psych Admission Type (Psych Patients Only)  Admission Status Voluntary  Psychosocial Assessment  Patient Complaints Anxiety;Depression;Self-harm thoughts;Sleep disturbance;Other (Comment) (patient stated that he slept hard last night and had some bad dreams; auditory hallucinations)  Eye Contact Fair  Facial Expression Flat  Affect Flat  Speech Soft  Interaction Assertive  Motor Activity Slow  Appearance/Hygiene Layered clothes;Unremarkable  Behavior Characteristics Cooperative;Appropriate to situation  Mood Depressed;Anxious;Preoccupied;Pleasant (patient states he's feeling this way because I don't know what's next.)  Aggressive Behavior  Effect No apparent injury  Thought Process  Coherency WDL  Content Preoccupation  Delusions None reported or observed  Perception Hallucinations  Hallucination Auditory (patient stated that the voices told me not to eat, so I  forced myself to eat.)  Judgment Impaired  Confusion None  Danger to Self  Current suicidal ideation? Passive  Self-Injurious Behavior Some self-injurious ideation observed or expressed.  No lethal plan expressed  (patient stated that the thoughts are there, but he had no plan.)  Agreement Not to Harm Self Yes  Description of Agreement Verbal  Danger to Others  Danger to Others None reported or observed    "

## 2024-06-14 NOTE — Progress Notes (Signed)
" °   06/14/24 1751  Complaints & Interventions  Complains of Agitation;Anxiety;Hallucinations  Interventions Medication (see MAR)  Neuro symptoms relieved by Anti-anxiety medication;Other (Comment)   Patient was upset and didn't understand why his medication was moved from 1700 to 2200. Patient stated that this writer should have woken him up this morning to take it on time. This clinical research associate explained that he was called twice and never came up. Patient stated that he can't hear the overhead calls. This education officer, community for the intercom system not working and that it is his responsibility to come up and take his medication. Patient walked off mumbling if you don't want to give me the pills, I don't really give a fuck, and started hitting himself in the head, while walking back to his room. Patient was then heard slamming his room door and punching the wall. Patient was given IM PRN medication for agitation. Patient tolerated medication well, without any issues. Patient apologized to this clinical research associate stating that he was sorry that he acted out, but he doesn't like sudden changes. This clinical research associate informed patient that his frustrations were valid and that his apology was accepted. Patient went back to his room and will continue to be monitored for safety. "

## 2024-06-14 NOTE — Group Note (Signed)
 Recreation Therapy Group Note   Group Topic:Stress Management  Group Date: 06/14/2024 Start Time: 1530 End Time: 1630 Facilitators: Celestia Jeoffrey BRAVO, LRT, CTRS Location: Dayroom  Group Description: Rummy. LRT and pts played games of Rummy. LRT prompted group discussion on the physical signs and symptoms of stress, like those you feel when playing a competitive card game. LRT and pt discussed the physical and mental signs of stress, as well as coping skills to manage them.   Goal Area(s) Addressed: Patient will identify physical symptoms of stress. Patient will identify coping skills for stress. Patient will build frustration tolerance skills.  Patient will increase communication.    Affect/Mood: N/A   Participation Level: Did not attend    Clinical Observations/Individualized Feedback: Patient did not attend.  Plan: Continue to engage patient in RT group sessions 2-3x/week.   Jeoffrey BRAVO Celestia, LRT, CTRS 06/14/2024 5:18 PM

## 2024-06-14 NOTE — Plan of Care (Signed)
 Jon Clayton is a 29 y.o. male patient. No diagnosis found. Past Medical History:  Diagnosis Date   Depression    Psychosis (HCC)    PTSD (post-traumatic stress disorder)    Current Facility-Administered Medications  Medication Dose Route Frequency Provider Last Rate Last Admin   acetaminophen  (TYLENOL ) tablet 650 mg  650 mg Oral Q6H PRN Dasie Ellouise CROME, FNP   650 mg at 06/05/24 2023   albuterol  (VENTOLIN  HFA) 108 (90 Base) MCG/ACT inhaler 2 puff  2 puff Inhalation Q6H PRN Dasie Ellouise CROME, FNP   2 puff at 06/13/24 1008   alum & mag hydroxide-simeth (MAALOX/MYLANTA) 200-200-20 MG/5ML suspension 30 mL  30 mL Oral Q4H PRN Allen, Tina L, FNP       haloperidol  (HALDOL ) tablet 5 mg  5 mg Oral TID PRN Dasie Ellouise CROME, FNP   5 mg at 06/11/24 2116   And   diphenhydrAMINE  (BENADRYL ) capsule 50 mg  50 mg Oral TID PRN Allen, Tina L, FNP   50 mg at 06/11/24 2116   haloperidol  lactate (HALDOL ) injection 5 mg  5 mg Intramuscular TID PRN Dasie Ellouise CROME, FNP       And   diphenhydrAMINE  (BENADRYL ) injection 50 mg  50 mg Intramuscular TID PRN Dasie Ellouise CROME, FNP       And   LORazepam  (ATIVAN ) injection 2 mg  2 mg Intramuscular TID PRN Allen, Tina L, FNP       haloperidol  lactate (HALDOL ) injection 10 mg  10 mg Intramuscular TID PRN Dasie Ellouise CROME, FNP       And   diphenhydrAMINE  (BENADRYL ) injection 50 mg  50 mg Intramuscular TID PRN Dasie Ellouise CROME, FNP       And   LORazepam  (ATIVAN ) injection 2 mg  2 mg Intramuscular TID PRN Allen, Tina L, FNP       hydrOXYzine  (ATARAX ) tablet 25 mg  25 mg Oral TID PRN Allen, Tina L, FNP   25 mg at 06/13/24 1708   lithium  carbonate capsule 300 mg  300 mg Oral BID WC Shrivastava, Aryendra, MD   300 mg at 06/13/24 1708   magnesium  hydroxide (MILK OF MAGNESIA) suspension 30 mL  30 mL Oral Daily PRN Dasie Ellouise CROME, FNP   30 mL at 06/10/24 9191   nicotine  polacrilex (NICORETTE ) gum 2 mg  2 mg Oral PRN Jadapalle, Sree, MD   2 mg at 06/12/24 0820   OLANZapine  (ZYPREXA ) tablet 10 mg  10 mg  Oral BID Shrivastava, Aryendra, MD   10 mg at 06/13/24 1708   omega-3 acid ethyl esters (LOVAZA ) capsule 1 g  1 g Oral BID Shrivastava, Aryendra, MD   1 g at 06/13/24 1708   ondansetron  (ZOFRAN -ODT) disintegrating tablet 4 mg  4 mg Oral Q8H PRN Allen, Tina L, FNP       prazosin  (MINIPRESS ) capsule 2 mg  2 mg Oral QHS Hunter, Crystal L, PA-C       traZODone  (DESYREL ) tablet 50 mg  50 mg Oral QHS PRN Dasie Ellouise CROME, FNP   50 mg at 06/12/24 2114   Allergies[1] Principal Problem:   MDD (major depressive disorder), recurrent episode, with atypical features  Blood pressure 126/73, pulse 61, temperature 98.3 F (36.8 C), temperature source Oral, resp. rate 18, height 5' 7 (1.702 m), weight 97.5 kg, SpO2 99%.    Khalil Belote B Amany Rando 06/14/2024      [1]  Allergies Allergen Reactions   Amoxicillin Anaphylaxis and Rash   Penicillins Anaphylaxis and  Rash   Other Swelling and Other (See Comments)    Pt said he is allergic to tapioca. Causes big knots to appear on his tongue.   Pertussis Vaccines Hives and Other (See Comments)    Per MD, said he was allergic to a medication in the vaccine   Tape Other (See Comments)    Bandages, once removed, leave the skin swollen/puffy/irritated where they were placed   Blue Mussel [Mytilus] Itching, Swelling, Rash and Other (See Comments)    Itchy throat

## 2024-06-14 NOTE — Progress Notes (Signed)
" °   06/14/24 0102  Psych Admission Type (Psych Patients Only)  Admission Status Voluntary  Psychosocial Assessment  Patient Complaints None  Eye Contact Brief;Fair  Facial Expression Flat  Affect Flat  Speech Logical/coherent  Interaction Assertive  Motor Activity Slow  Appearance/Hygiene Unremarkable  Behavior Characteristics Cooperative  Mood Depressed  Thought Process  Coherency WDL  Content WDL  Delusions None reported or observed  Perception WDL  Hallucination None reported or observed  Judgment Poor  Confusion WDL  Danger to Self  Current suicidal ideation? Denies  Self-Injurious Behavior No self-injurious ideation or behavior indicators observed or expressed   Agreement Not to Harm Self Yes  Description of Agreement Verbal    "

## 2024-06-14 NOTE — Progress Notes (Signed)
 This clinical research associate moved patient's 1700 medication to 2200 being that he didn't receive the 0800 dose until 1213. This clinical research associate reached out to NP, via secure chat, to notify her of this change.

## 2024-06-14 NOTE — Progress Notes (Signed)
 Patient has been called twice for scheduled medication and has yet to come up. This clinical research associate will attempt again at lunch time. Providers will be notified.

## 2024-06-14 NOTE — Group Note (Signed)
 Recreation Therapy Group Note   Group Topic:Leisure Education  Group Date: 06/14/2024 Start Time: 1020 End Time: 1115 Facilitators: Celestia Jeoffrey BRAVO, LRT, CTRS Location: Craft Room  Group Description: Leisure. Patients were given the option to choose from journaling, coloring, drawing, making origami, playing with playdoh, listening to music or singing karaoke. LRT and pts discussed the meaning of leisure, the importance of participating in leisure during their free time/when they're outside of the hospital, as well as how our leisure interests can also serve as coping skills.   Goal Area(s) Addressed:  Patient will identify a current leisure interest.  Patient will learn the definition of leisure. Patient will practice making a positive decision. Patient will have the opportunity to try a new leisure activity. Patient will communicate with peers and LRT.    Affect/Mood: N/A   Participation Level: Did not attend    Clinical Observations/Individualized Feedback: Patient did not attend.  Plan: Continue to engage patient in RT group sessions 2-3x/week.   Jeoffrey BRAVO Celestia, LRT, CTRS 06/14/2024 11:39 AM

## 2024-06-14 NOTE — Group Note (Signed)
 Date:  06/14/2024 Time:  10:11 AM  Group Topic/Focus:  Goals Group:   The focus of this group is to help patients establish daily goals to achieve during treatment and discuss how the patient can incorporate goal setting into their daily lives to aide in recovery.    Participation Level:  Did Not Attend   Jon Clayton June 06/14/2024, 10:11 AM

## 2024-06-14 NOTE — Group Note (Signed)
 Date:  06/14/2024 Time:  6:57 PM  Group Topic/Focus:  Wellness Toolbox:   The focus of this group is to discuss various aspects of wellness, balancing those aspects and exploring ways to increase the ability to experience wellness.  Patients will create a wellness toolbox for use upon discharge.    Participation Level:  Did Not Attend   Jon Clayton 06/14/2024, 6:57 PM

## 2024-06-14 NOTE — Plan of Care (Signed)

## 2024-06-14 NOTE — Progress Notes (Signed)
 Pt calm and pleasant during assessment denying SI/HI/AVH Pt observed by this writer being more active on the unit than when I had them earlier this admission. Pt compliant with medication administration per MD orders. Pt given education, support, and encouragement to be active in his treatment plan. Pt being monitored Q 15 minutes for safety per unit protocol, remains safe on the unit
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
## 2024-06-15 DIAGNOSIS — F4310 Post-traumatic stress disorder, unspecified: Secondary | ICD-10-CM | POA: Diagnosis not present

## 2024-06-15 DIAGNOSIS — R45851 Suicidal ideations: Secondary | ICD-10-CM | POA: Diagnosis not present

## 2024-06-15 DIAGNOSIS — F603 Borderline personality disorder: Secondary | ICD-10-CM | POA: Diagnosis not present

## 2024-06-15 LAB — LITHIUM LEVEL: Lithium Lvl: 0.17 mmol/L — ABNORMAL LOW (ref 0.60–1.20)

## 2024-06-15 MED ORDER — OLANZAPINE 5 MG PO TABS
5.0000 mg | ORAL_TABLET | Freq: Every day | ORAL | Status: DC
  Administered 2024-06-16 – 2024-06-18 (×3): 5 mg via ORAL
  Filled 2024-06-15 (×3): qty 1

## 2024-06-15 MED ORDER — LITHIUM CARBONATE 300 MG PO CAPS
300.0000 mg | ORAL_CAPSULE | Freq: Two times a day (BID) | ORAL | Status: DC
  Administered 2024-06-15 – 2024-06-18 (×6): 300 mg via ORAL
  Filled 2024-06-15 (×6): qty 1

## 2024-06-15 MED ORDER — OLANZAPINE 10 MG PO TABS
10.0000 mg | ORAL_TABLET | Freq: Every day | ORAL | Status: DC
  Administered 2024-06-16 – 2024-06-17 (×2): 10 mg via ORAL
  Filled 2024-06-15 (×2): qty 1
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
## 2024-06-15 NOTE — BH IP Treatment Plan (Signed)
 Interdisciplinary Treatment and Diagnostic Plan Update  06/15/2024 Time of Session: 1030am Jon Clayton MRN: [id number]  Principal Diagnosis: MDD (major depressive disorder), recurrent episode, with atypical features  Secondary Diagnoses: Principal Problem:   MDD (major depressive disorder), recurrent episode, with atypical features   Current Medications:  Current Facility-Administered Medications  Medication Dose Route Frequency Provider Last Rate Last Admin   acetaminophen  (TYLENOL ) tablet 650 mg  650 mg Oral Q6H PRN Dasie Ellouise CROME, FNP   650 mg at 06/05/24 2023   albuterol  (VENTOLIN  HFA) 108 (90 Base) MCG/ACT inhaler 2 puff  2 puff Inhalation Q6H PRN Dasie Ellouise CROME, FNP   2 puff at 06/13/24 1008   alum & mag hydroxide-simeth (MAALOX/MYLANTA) 200-200-20 MG/5ML suspension 30 mL  30 mL Oral Q4H PRN Allen, Tina L, FNP       haloperidol  (HALDOL ) tablet 5 mg  5 mg Oral TID PRN Dasie Ellouise CROME, FNP   5 mg at 06/11/24 2116   And   diphenhydrAMINE  (BENADRYL ) capsule 50 mg  50 mg Oral TID PRN Dasie Ellouise CROME, FNP   50 mg at 06/11/24 2116   haloperidol  lactate (HALDOL ) injection 5 mg  5 mg Intramuscular TID PRN Dasie Ellouise CROME, FNP       And   diphenhydrAMINE  (BENADRYL ) injection 50 mg  50 mg Intramuscular TID PRN Dasie Ellouise CROME, FNP       And   LORazepam  (ATIVAN ) injection 2 mg  2 mg Intramuscular TID PRN Dasie Ellouise CROME, FNP       haloperidol  lactate (HALDOL ) injection 10 mg  10 mg Intramuscular TID PRN Dasie Ellouise CROME, FNP   10 mg at 06/14/24 1750   And   diphenhydrAMINE  (BENADRYL ) injection 50 mg  50 mg Intramuscular TID PRN Dasie Ellouise CROME, FNP   50 mg at 06/14/24 1750   And   LORazepam  (ATIVAN ) injection 2 mg  2 mg Intramuscular TID PRN Dasie Ellouise CROME, FNP   2 mg at 06/14/24 1751   hydrOXYzine  (ATARAX ) tablet 25 mg  25 mg Oral TID PRN Allen, Tina L, FNP   25 mg at 06/13/24 1708   lithium  carbonate capsule 300 mg  300 mg Oral BID WC Shrivastava, Aryendra, MD   300 mg at 06/15/24 9180   magnesium   hydroxide (MILK OF MAGNESIA) suspension 30 mL  30 mL Oral Daily PRN Dasie Ellouise CROME, FNP   30 mL at 06/10/24 9191   nicotine  polacrilex (NICORETTE ) gum 2 mg  2 mg Oral PRN Jadapalle, Sree, MD   2 mg at 06/12/24 0820   OLANZapine  (ZYPREXA ) tablet 10 mg  10 mg Oral BID Shrivastava, Aryendra, MD   10 mg at 06/15/24 0819   omega-3 acid ethyl esters (LOVAZA ) capsule 1 g  1 g Oral BID Shrivastava, Aryendra, MD   1 g at 06/15/24 9180   ondansetron  (ZOFRAN -ODT) disintegrating tablet 4 mg  4 mg Oral Q8H PRN Dasie Ellouise CROME, FNP       prazosin  (MINIPRESS ) capsule 2 mg  2 mg Oral QHS Hunter, Crystal L, PA-C   2 mg at 06/14/24 2134   traZODone  (DESYREL ) tablet 50 mg  50 mg Oral QHS PRN Allen, Tina L, FNP   50 mg at 06/14/24 2134   PTA Medications: Medications Prior to Admission  Medication Sig Dispense Refill Last Dose/Taking   albuterol  (VENTOLIN  HFA) 108 (90 Base) MCG/ACT inhaler Inhale 2 puffs into the lungs every 6 (six) hours as needed for wheezing or shortness of breath. 6.7 g  0    OLANZapine  (ZYPREXA ) 5 MG tablet Take 1 PO QAM and 3 PO QHS (Patient not taking: Reported on 06/03/2024) 120 tablet 1    prazosin  (MINIPRESS ) 2 MG capsule Take 1 capsule (2 mg total) by mouth at bedtime. (Patient not taking: Reported on 06/03/2024) 30 capsule 1     Patient Stressors: Financial difficulties   Health problems   Medication change or noncompliance   Occupational concerns   Substance abuse   Traumatic event    Patient Strengths: Ability for insight  Average or above average intelligence  Communication skills  General fund of knowledge  Motivation for treatment/growth   Treatment Modalities: Medication Management, Group therapy, Case management,  1 to 1 session with clinician, Psychoeducation, Recreational therapy.   Physician Treatment Plan for Primary Diagnosis: MDD (major depressive disorder), recurrent episode, with atypical features Long Term Goal(s): Improvement in symptoms so as ready for  discharge   Short Term Goals: Ability to identify changes in lifestyle to reduce recurrence of condition will improve Ability to verbalize feelings will improve Ability to disclose and discuss suicidal ideas Ability to demonstrate self-control will improve Ability to identify and develop effective coping behaviors will improve Ability to maintain clinical measurements within normal limits will improve Compliance with prescribed medications will improve Ability to identify triggers associated with substance abuse/mental health issues will improve  Medication Management: Evaluate patient's response, side effects, and tolerance of medication regimen.  Therapeutic Interventions: 1 to 1 sessions, Unit Group sessions and Medication administration.  Evaluation of Outcomes: Progressing  Physician Treatment Plan for Secondary Diagnosis: Principal Problem:   MDD (major depressive disorder), recurrent episode, with atypical features  Long Term Goal(s): Improvement in symptoms so as ready for discharge   Short Term Goals: Ability to identify changes in lifestyle to reduce recurrence of condition will improve Ability to verbalize feelings will improve Ability to disclose and discuss suicidal ideas Ability to demonstrate self-control will improve Ability to identify and develop effective coping behaviors will improve Ability to maintain clinical measurements within normal limits will improve Compliance with prescribed medications will improve Ability to identify triggers associated with substance abuse/mental health issues will improve     Medication Management: Evaluate patient's response, side effects, and tolerance of medication regimen.  Therapeutic Interventions: 1 to 1 sessions, Unit Group sessions and Medication administration.  Evaluation of Outcomes: Progressing   RN Treatment Plan for Primary Diagnosis: MDD (major depressive disorder), recurrent episode, with atypical features Long  Term Goal(s): Knowledge of disease and therapeutic regimen to maintain health will improve  Short Term Goals: Ability to remain free from injury will improve, Ability to verbalize frustration and anger appropriately will improve, Ability to demonstrate self-control, Ability to participate in decision making will improve, Ability to verbalize feelings will improve, Ability to disclose and discuss suicidal ideas, Ability to identify and develop effective coping behaviors will improve, and Compliance with prescribed medications will improve  Medication Management: RN will administer medications as ordered by provider, will assess and evaluate patient's response and provide education to patient for prescribed medication. RN will report any adverse and/or side effects to prescribing provider.  Therapeutic Interventions: 1 on 1 counseling sessions, Psychoeducation, Medication administration, Evaluate responses to treatment, Monitor vital signs and CBGs as ordered, Perform/monitor CIWA, COWS, AIMS and Fall Risk screenings as ordered, Perform wound care treatments as ordered.  Evaluation of Outcomes: Progressing   LCSW Treatment Plan for Primary Diagnosis: MDD (major depressive disorder), recurrent episode, with atypical features Long Term Goal(s): Safe  transition to appropriate next level of care at discharge, Engage patient in therapeutic group addressing interpersonal concerns.  Short Term Goals: Engage patient in aftercare planning with referrals and resources, Increase social support, Increase ability to appropriately verbalize feelings, Increase emotional regulation, Facilitate acceptance of mental health diagnosis and concerns, Facilitate patient progression through stages of change regarding substance use diagnoses and concerns, and Identify triggers associated with mental health/substance abuse issues  Therapeutic Interventions: Assess for all discharge needs, 1 to 1 time with Social worker, Explore  available resources and support systems, Assess for adequacy in community support network, Educate family and significant other(s) on suicide prevention, Complete Psychosocial Assessment, Interpersonal group therapy.  Evaluation of Outcomes: Progressing   Progress in Treatment: Attending groups: No. Participating in groups: No. Taking medication as prescribed: Yes. Toleration medication: Yes. Family/Significant other contact made: No, will contact:  if given permission Patient understands diagnosis: Yes. Discussing patient identified problems/goals with staff: Yes. Medical problems stabilized or resolved: Yes. Denies suicidal/homicidal ideation: No. Issues/concerns per patient self-inventory: No. Other: none  New problem(s) identified: No, Describe:  06/15/24 no changes  New Short Term/Long Term Goal(s): elimination of symptoms of psychosis, medication management for mood stabilization; elimination of SI thoughts; development of comprehensive mental wellness/sobriety plan. 06/10/24 Update: No changes at this time.    Patient Goals:   Get back on my medicine and I'd like to go to [facility] or [facility] from here. 06/10/24 Update: No changes at this time. 06/15/24 no changes    Discharge Plan or Barriers: CSW will assist pt with development of an appropriate aftercare/discharge plan. 06/10/24 Update: No changes at this 06/15/24 no changes  Reason for Continuation of Hospitalization: Depression Hallucinations Medication stabilization Suicidal ideation  Estimated Length of Stay:TBD 06/15/24  Last 3 [place] Suicide Severity Risk Score: Flowsheet Row Admission (Current) from 06/03/2024 in [facility] Most recent reading at 06/03/2024  4:12 PM ED from 06/03/2024 in [facility] Most recent reading at 06/03/2024  3:09 AM ED from 03/28/2023 in [facility] Emergency Department at [facility] Most recent reading at  03/28/2023  2:18 PM  C-SSRS RISK CATEGORY High Risk High Risk No Risk    Last PHQ 2/9 Scores:    09/21/2022    8:28 AM 09/20/2022   10:21 PM 09/19/2022    5:57 PM  Depression screen PHQ 2/9  Decreased Interest 2 2 3   Down, Depressed, Hopeless 2 2 2   PHQ - 2 Score 4 4 5   Altered sleeping 2 2 3   Tired, decreased energy 2 2 2   Change in appetite   3  Feeling bad or failure about yourself   3 3  Trouble concentrating  2 2  Moving slowly or fidgety/restless  2 3  Suicidal thoughts  2 3  PHQ-9 Score 8  17  24    Difficult doing work/chores   Very difficult     Data saved with a previous flowsheet row definition    Scribe for Treatment Team: Pamila Katrina HUGHS 06/15/2024 10:51 AM

## 2024-06-15 NOTE — Plan of Care (Signed)
   Problem: Education: Goal: Emotional status will improve Outcome: Progressing Goal: Mental status will improve Outcome: Progressing

## 2024-06-15 NOTE — Group Note (Signed)
 Date:  06/15/2024 Time:  2:06 PM  Group Topic/Focus:  Goals Group:   The focus of this group is to help patients establish daily goals to achieve during treatment and discuss how the patient can incorporate goal setting into their daily lives to aide in recovery.    Participation Level:  Did Not Attend   [name] HERO Airis Barbee 06/15/2024, 2:06 PM

## 2024-06-15 NOTE — Plan of Care (Signed)
   Problem: Education: Goal: Knowledge of [facility] Education information/materials will improve Outcome: Progressing Goal: Emotional status will improve Outcome: Progressing Goal: Mental status will improve Outcome: Progressing   Problem: Activity: Goal: Interest or engagement in activities will improve Outcome: Progressing

## 2024-06-15 NOTE — Group Note (Signed)
 Date:  06/15/2024 Time:  10:10 PM  Group Topic/Focus:  Wrap-Up Group:   The focus of this group is to help patients review their daily goal of treatment and discuss progress on daily workbooks.   Additional Comments:  DIDN'T ATTEND   Kerri Katz 06/15/2024, 10:10 PM

## 2024-06-16 DIAGNOSIS — F603 Borderline personality disorder: Secondary | ICD-10-CM | POA: Diagnosis not present

## 2024-06-16 DIAGNOSIS — F4310 Post-traumatic stress disorder, unspecified: Secondary | ICD-10-CM | POA: Diagnosis not present

## 2024-06-16 DIAGNOSIS — R45851 Suicidal ideations: Secondary | ICD-10-CM | POA: Diagnosis not present

## 2024-06-16 NOTE — Progress Notes (Signed)
" °   06/15/24 2200  Psych Admission Type (Psych Patients Only)  Admission Status Voluntary  Psychosocial Assessment  Patient Complaints Depression  Eye Contact Fair  Facial Expression Flat  Affect Flat  Speech Soft  Interaction Assertive  Motor Activity Slow  Appearance/Hygiene Unremarkable  Behavior Characteristics Cooperative;Appropriate to situation  Mood Depressed  Aggressive Behavior  Effect No apparent injury  Thought Process  Coherency WDL  Content WDL  Delusions None reported or observed  Perception WDL  Hallucination None reported or observed  Judgment WDL  Confusion None  Danger to Self  Current suicidal ideation? Denies  Agreement Not to Harm Self Yes  Description of Agreement verbal  Danger to Others  Danger to Others None reported or observed    "

## 2024-06-16 NOTE — Plan of Care (Signed)
   Problem: Education: Goal: Emotional status will improve Outcome: Progressing Goal: Mental status will improve Outcome: Progressing   Problem: Activity: Goal: Interest or engagement in activities will improve Outcome: Progressing Goal: Sleeping patterns will improve Outcome: Progressing

## 2024-06-16 NOTE — Progress Notes (Signed)
" °   06/16/24 0830  Psych Admission Type (Psych Patients Only)  Admission Status Voluntary  Psychosocial Assessment  Patient Complaints Depression  Eye Contact Fair  Facial Expression Flat  Affect Flat  Speech Soft  Interaction Assertive  Motor Activity Slow  Appearance/Hygiene Unremarkable  Behavior Characteristics Cooperative;Appropriate to situation  Mood Depressed  Thought Process  Coherency WDL  Content WDL  Delusions None reported or observed  Perception WDL  Hallucination None reported or observed  Judgment WDL  Confusion None  Danger to Self  Current suicidal ideation? Passive  Agreement Not to Harm Self Yes  Description of Agreement verbal  Danger to Others  Danger to Others None reported or observed    "

## 2024-06-16 NOTE — Progress Notes (Signed)
 Patient states he is going to hang himself. RN was walking by his room and patient looked suspicious. Patient was asked what he was doing and replied that he was just tying up his blanket to hang himself. Provider notified

## 2024-06-16 NOTE — Plan of Care (Signed)
" °  Problem: Education: Goal: Knowledge of [facility] Education information/materials will improve Outcome: Progressing   Problem: Education: Goal: Mental status will improve Outcome: Progressing   Problem: Education: Goal: Verbalization of understanding the information provided will improve Outcome: Progressing   Problem: Activity: Goal: Interest or engagement in activities will improve Outcome: Progressing   Problem: Coping: Goal: Ability to verbalize frustrations and anger appropriately will improve Outcome: Progressing   "

## 2024-06-16 NOTE — Group Note (Signed)
 Date:  06/16/2024 Time:  10:35 PM  Group Topic/Focus:  Making Healthy Choices:   The focus of this group is to help patients identify negative/unhealthy choices they were using prior to admission and identify positive/healthier coping strategies to replace them upon discharge. Self Care:   The focus of this group is to help patients understand the importance of self-care in order to improve or restore emotional, physical, spiritual, interpersonal, and financial health.    Participation Level:  Active  Participation Quality:  Appropriate and Attentive  Affect:  Appropriate  Cognitive:  Alert, Appropriate, and Oriented  Insight: Appropriate and Good  Engagement in Group:  Engaged  Modes of Intervention:  Discussion and Support  Additional Comments:  N/A  Butler LITTIE Gelineau 06/16/2024, 10:35 PM
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
## 2024-06-17 LAB — HEMOGLOBIN A1C
Hgb A1c MFr Bld: 5 % (ref 4.8–5.6)
Mean Plasma Glucose: 96.8 mg/dL

## 2024-06-17 LAB — COMPREHENSIVE METABOLIC PANEL WITH GFR
ALT: 40 U/L (ref 0–44)
AST: 27 U/L (ref 15–41)
Albumin: 4.2 g/dL (ref 3.5–5.0)
Alkaline Phosphatase: 58 U/L (ref 38–126)
Anion gap: 8 (ref 5–15)
BUN: 11 mg/dL (ref 6–20)
CO2: 25 mmol/L (ref 22–32)
Calcium: 9.3 mg/dL (ref 8.9–10.3)
Chloride: 107 mmol/L (ref 98–111)
Creatinine, Ser: 0.86 mg/dL (ref 0.61–1.24)
GFR, Estimated: >60 mL/min
Glucose, Bld: 99 mg/dL (ref 70–99)
Potassium: 4 mmol/L (ref 3.5–5.1)
Sodium: 140 mmol/L (ref 135–145)
Total Bilirubin: 0.2 mg/dL (ref 0.0–1.2)
Total Protein: 6.3 g/dL — ABNORMAL LOW (ref 6.5–8.1)

## 2024-06-17 LAB — LITHIUM LEVEL: Lithium Lvl: 0.48 mmol/L — ABNORMAL LOW (ref 0.60–1.20)

## 2024-06-17 LAB — TSH: TSH: 2.45 u[IU]/mL (ref 0.350–4.500)

## 2024-06-17 MED ORDER — OLANZAPINE 10 MG PO TABS
10.0000 mg | ORAL_TABLET | Freq: Every day | ORAL | 0 refills | Status: AC
  Filled 2024-06-17: qty 30, 30d supply, fill #0

## 2024-06-17 MED ORDER — LITHIUM CARBONATE 300 MG PO CAPS
300.0000 mg | ORAL_CAPSULE | Freq: Two times a day (BID) | ORAL | 0 refills | Status: AC
  Filled 2024-06-17: qty 60, 30d supply, fill #0

## 2024-06-17 MED ORDER — OLANZAPINE 5 MG PO TABS
5.0000 mg | ORAL_TABLET | Freq: Every day | ORAL | 0 refills | Status: AC
  Filled 2024-06-17: qty 30, 30d supply, fill #0

## 2024-06-17 MED ORDER — OMEGA-3-ACID ETHYL ESTERS 1 G PO CAPS
1.0000 g | ORAL_CAPSULE | Freq: Two times a day (BID) | ORAL | 0 refills | Status: AC
  Filled 2024-06-17: qty 60, 30d supply, fill #0

## 2024-06-17 MED ORDER — ALBUTEROL SULFATE HFA 108 (90 BASE) MCG/ACT IN AERS
2.0000 | INHALATION_SPRAY | Freq: Four times a day (QID) | RESPIRATORY_TRACT | 0 refills | Status: AC | PRN
  Filled 2024-06-17: qty 6.7, 30d supply, fill #0

## 2024-06-17 MED ORDER — PRAZOSIN HCL 2 MG PO CAPS
2.0000 mg | ORAL_CAPSULE | Freq: Every day | ORAL | 1 refills | Status: AC
  Filled 2024-06-17: qty 30, 30d supply, fill #0

## 2024-06-17 NOTE — Progress Notes (Signed)
 Pt calm and pleasant during assessment denying SI/HI/AVH. Pt observed by this Clinical research associate interacting appropriately with staff and peers on the unit. Pt compliant with medication administration per MD orders. Pt given education, support, and encouragement to be active in his treatment plan. Pt being monitored Q 15 minutes for safety per unit protocol, remains safe on the unit

## 2024-06-17 NOTE — Group Note (Deleted)
 Date:  06/17/2024 Time:  12:45 PM  Group Topic/Focus:  Building Self Esteem:   The Focus of this group is helping patients become aware of the effects of self-esteem on their lives, the things they and others do that enhance or undermine their self-esteem, seeing the relationship between their level of self-esteem and the choices they make and learning ways to enhance self-esteem.     Participation Level:  {BHH PARTICIPATION OZCZO:77735}  Participation Quality:  {BHH PARTICIPATION QUALITY:22265}  Affect:  {BHH AFFECT:22266}  Cognitive:  {BHH COGNITIVE:22267}  Insight: {BHH Insight2:20797}  Engagement in Group:  {BHH ENGAGEMENT IN HMNLE:77731}  Modes of Intervention:  {BHH MODES OF INTERVENTION:22269}  Additional Comments:  ***  Jon Clayton 06/17/2024, 12:45 PM

## 2024-06-17 NOTE — Progress Notes (Signed)
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                            3. Medical Issues Being Addressed:  -No acute concerns at this time     4. Discharge Planning:   -- Social work and case management to assist with discharge planning and identification of hospital follow-up needs prior to discharge  -- Estimated LOS: 5-7 days   Rosana Farnell PMHNP

## 2024-06-17 NOTE — Group Note (Signed)
 Recreation Therapy Group Note   Group Topic:Coping Skills  Group Date: 06/17/2024 Start Time: 1050 End Time: 1135 Facilitators: Celestia Jeoffrey BRAVO, LRT, CTRS Location: Craft Room  Group Description: Mind Map.  Patient was provided a blank template of a diagram with 32 blank boxes in a tiered system, branching from the center (similar to a bubble chart). LRT directed patients to label the middle of the diagram Coping Skills. LRT and patients then came up with 8 different coping skills as examples. Pt were directed to record their coping skills in the 2nd tier boxes closest to the center.  Patients would then share their coping skills with the group as LRT wrote them out. LRT gave a handout of 99 different coping skills at the end of group.   Goal Area(s) Addressed: Patients will be able to define coping skills. Patient will identify new coping skills.  Patient will increase communication.   Affect/Mood: N/A   Participation Level: Did not attend    Clinical Observations/Individualized Feedback: Patient did not attend.  Plan: Continue to engage patient in RT group sessions 2-3x/week.   Jeoffrey BRAVO Celestia, LRT, CTRS 06/17/2024 12:39 PM

## 2024-06-17 NOTE — Group Note (Signed)
 LCSW Group Therapy Note  Group Date: 06/17/2024 Start Time: 1315 End Time: 1400   Type of Therapy and Topic:  Group Therapy: Anger Cues and Responses  Participation Level:  Did Not Attend   Description of Group:   In this group, patients learned how to recognize the physical, cognitive, emotional, and behavioral responses they have to anger-provoking situations.  They identified a recent time they became angry and how they reacted.  They analyzed how their reaction was possibly beneficial and how it was possibly unhelpful.  The group discussed a variety of healthier coping skills that could help with such a situation in the future.  Focus was placed on how helpful it is to recognize the underlying emotions to our anger, because working on those can lead to a more permanent solution as well as our ability to focus on the important rather than the urgent.  Therapeutic Goals: Patients will remember their last incident of anger and how they felt emotionally and physically, what their thoughts were at the time, and how they behaved. Patients will identify how their behavior at that time worked for them, as well as how it worked against them. Patients will explore possible new behaviors to use in future anger situations. Patients will learn that anger itself is normal and cannot be eliminated, and that healthier reactions can assist with resolving conflict rather than worsening situations.  Summary of Patient Progress:  X  Therapeutic Modalities:   Cognitive Behavioral Therapy    Sherryle JINNY Margo, LCSW 06/17/2024  2:38 PM

## 2024-06-17 NOTE — Plan of Care (Signed)
   Problem: Education: Goal: Knowledge of [facility] Education information/materials will improve Outcome: Progressing Goal: Emotional status will improve Outcome: Progressing Goal: Mental status will improve Outcome: Progressing Goal: Verbalization of understanding the information provided will improve Outcome: Progressing   Problem: Activity: Goal: Interest or engagement in activities will improve Outcome: Progressing Goal: Sleeping patterns will improve Outcome: Progressing   Problem: Coping: Goal: Ability to verbalize frustrations and anger appropriately will improve Outcome: Progressing Goal: Ability to demonstrate self-control will improve Outcome: Progressing

## 2024-06-17 NOTE — BHH Counselor (Addendum)
 ADDENDUM  CSW contacted Mr. T to confirm bed. He stated that he would accept pt. CSW informed Mr. ONEIDA that pt discharge is pending but could potentially be tomorrow or the next day. He agreed, stating that tomorrow would be best. No other concerns expressed. Contact ended without incident.  Nadara SAUNDERS. Chaim, MSW, LCSW, LCAS 06/17/2024 3:37 PM  CSW contacted Mr. ONEIDA [phone]) of [facility]. Mr. ONEIDA confirmed that he has some bed availability. He stated that pt would just need to call him to go through screening. No other concerns expressed. Contact ended without incident.   CSW notified pt that he needed to call. Pt completed screening via phone.   Nadara SAUNDERS. Chaim, MSW, LCSW, LCAS 06/17/2024 3:17 PM

## 2024-06-17 NOTE — Progress Notes (Signed)
" °   06/16/24 2028  Psych Admission Type (Psych Patients Only)  Admission Status Voluntary  Psychosocial Assessment  Patient Complaints Anxiety;Depression  Eye Contact Fair  Facial Expression Flat  Affect Flat  Speech Soft  Interaction Assertive  Motor Activity Other (Comment) (EDL)  Appearance/Hygiene Unremarkable  Behavior Characteristics Appropriate to situation  Mood Anxious;Depressed  Aggressive Behavior  Effect No apparent injury  Thought Process  Coherency WDL  Content WDL  Delusions None reported or observed  Perception WDL  Hallucination None reported or observed  Judgment WDL  Confusion None  Danger to Self  Current suicidal ideation? Passive  Self-Injurious Behavior No self-injurious ideation or behavior indicators observed or expressed   Agreement Not to Harm Self Yes  Description of Agreement Verbal  Danger to Others  Danger to Others None reported or observed    "

## 2024-06-17 NOTE — BHH Suicide Risk Assessment (Signed)
 [facility] Discharge Suicide Risk Assessment   Principal Problem: MDD (major depressive disorder), recurrent episode, with atypical features Discharge Diagnoses: Principal Problem:   MDD (major depressive disorder), recurrent episode, with atypical features   Total Time spent with patient: 30 minutes  Musculoskeletal: Strength & Muscle Tone: within normal limits Gait & Station: normal Patient leans: N/A  Psychiatric Specialty Exam  Presentation  General Appearance:  Appropriate for Environment  Eye Contact: Fair  Speech: Clear and Coherent  Speech Volume: Normal  Handedness: Right   Mood and Affect  Mood: Euthymic  Duration of Depression Symptoms: Greater than two weeks  Affect: Appropriate   Thought Process  Thought Processes: Coherent  Descriptions of Associations:Intact  Orientation:Full (Time, Place and Person)  Thought Content:Logical  History of Schizophrenia/Schizoaffective disorder:No  Duration of Psychotic Symptoms:Greater than six months  Hallucinations:Hallucinations: None  Ideas of Reference:None  Suicidal Thoughts:Suicidal Thoughts: No  Homicidal Thoughts:Homicidal Thoughts: No   Sensorium  Memory: Immediate Fair; Remote Fair  Judgment: Fair  Insight: Fair   Art Therapist  Concentration: Fair  Attention Span: Fair  Recall: [employer] of Knowledge: Fair  Language: Fair   Psychomotor Activity  Psychomotor Activity: Psychomotor Activity: Normal   Assets  Assets: Communication Skills; Physical Health; Resilience   Sleep  Sleep: Sleep: Fair  Estimated Sleeping Duration (Last 24 Hours): 10.50-11.25 hours  Physical Exam: Physical Exam Vitals and nursing note reviewed.    ROS Blood pressure 132/87, pulse 69, temperature 97.7 F (36.5 C), temperature source Oral, resp. rate 16, height 5' 7 (1.702 m), weight 97.5 kg, SpO2 98%. Body mass index is 33.67 kg/m.    Demographic Factors:   Male  Loss Factors: Decrease in vocational status  Historical Factors: Impulsivity  Risk Reduction Factors:   Positive social support, Positive therapeutic relationship, and Positive coping skills or problem solving skills  Continued Clinical Symptoms:  Depression:   Impulsivity  Cognitive Features That Contribute To Risk:  None    Suicide Risk:  Minimal: No identifiable suicidal ideation.  Patients presenting with no risk factors but with morbid ruminations; may be classified as minimal risk based on the severity of the depressive symptoms    Plan Of Care/Follow-up recommendations:  Activity:  as tolerated  Allyn Foil, MD 06/17/2024, 10:51 PM

## 2024-06-17 NOTE — Plan of Care (Signed)

## 2024-06-17 NOTE — BHH Counselor (Signed)
 CSW got a return call from [facility].   [facility] reports that no bed availability until January 29th.  She reports that at that time the bed is only available if the person does not show and none of the people on the waiting list are able to accept the bed.   CSW relayed the information to primary CSW.  Sherryle Margo, MSW, LCSW 06/17/2024 2:49 PM

## 2024-06-17 NOTE — Group Note (Signed)
 Date:  06/17/2024 Time:  8:54 PM  Group Topic/Focus:  Wrap-Up Group:   The focus of this group is to help patients review their daily goal of treatment and discuss progress on daily workbooks.    Participation Level:  Did Not Attend   Jon Clayton 06/17/2024, 8:54 PM

## 2024-06-17 NOTE — Group Note (Signed)
 Recreation Therapy Group Note   Group Topic:General Recreation  Group Date: 06/17/2024 Start Time: 1530 End Time: 1615 Facilitators: Celestia Jeoffrey BRAVO, LRT, CTRS Location: Courtyard  Group Description: [employer]. LRT and patients played games of basketball, drew with chalk, and played corn hole while outside in the courtyard while getting fresh air and sunlight. Music was being played in the background. LRT and peers conversed about different games they have played before, what they do in their free time and anything else that is on their minds. LRT encouraged pts to drink water  after being outside, sweating and getting their heart rate up.  Goal Area(s) Addressed: Patient will build on frustration tolerance skills. Patients will partake in a competitive play game with peers. Patients will gain knowledge of new leisure interest/hobby.    Affect/Mood: Appropriate   Participation Level: Non-verbal    Clinical Observations/Individualized Feedback: Jon Clayton was present in the courtyard for 10 minutes. Pt came late and left early.   Plan: Continue to engage patient in RT group sessions 2-3x/week.   Jeoffrey BRAVO Celestia, LRT, CTRS 06/17/2024 5:05 PM

## 2024-06-18 ENCOUNTER — Other Ambulatory Visit: Payer: Self-pay

## 2024-06-18 NOTE — Group Note (Signed)
 Date:  06/18/2024 Time:  7:54 AM  Group Topic/Focus:  Self Care:   The focus of this group is to help patients understand the importance of self-care in order to improve or restore emotional, physical, spiritual, interpersonal, and financial health. MHT took patients outside to get some air and enjoy nature.    Participation Level:  Did Not Attend  Leigh VEAR Pais 06/18/2024, 7:54 AM

## 2024-06-18 NOTE — Group Note (Signed)
 Date:  06/18/2024 Time:  7:46 AM  Group Topic/Focus:  Wellness Toolbox:   The focus of this group is to discuss various aspects of wellness, balancing those aspects and exploring ways to increase the ability to experience wellness.  Patients will create a wellness toolbox for use upon discharge.    Participation Level:  Did Not Attend  Jon Clayton 06/18/2024, 7:46 AM

## 2024-06-18 NOTE — Progress Notes (Signed)
" °  [facility] Adult Case Management Discharge Plan :  Will you be returning to the same living situation after discharge:  No. At discharge, do you have transportation home?: Yes,  pt received taxi voucher.  Do you have the ability to pay for your medications: Yes,  [employer].  Release of information consent forms completed and in the chart;  Patient's signature needed at discharge.  Patient to Follow up at:  Follow-up Information     [facility] up.   Why: Your appointment is scheduled for 06/25/24 at 12pm. It is a virtual appointment and they will reach out to you at [phone]. Contact information: [address]  [address] [place] [address] [phone]                 Next level of care provider has access to [facility] Link:no  Safety Planning and Suicide Prevention discussed: Yes,  SPE completed with pt.      Has patient been referred to the Quitline?: Patient refused referral for treatment  Patient has been referred for addiction treatment: Patient refused referral for treatment.  Nadara JONELLE Fam, LCSW 06/18/2024, 10:38 AM "

## 2024-06-18 NOTE — Progress Notes (Signed)
" °   06/18/24 0715  Psych Admission Type (Psych Patients Only)  Admission Status Voluntary  Psychosocial Assessment  Patient Complaints Depression  Eye Contact Fair  Facial Expression Flat  Affect Flat  Speech Soft  Interaction Minimal  Motor Activity Slow  Appearance/Hygiene Unremarkable  Behavior Characteristics Appropriate to situation  Mood Depressed  Aggressive Behavior  Effect No apparent injury  Thought Process  Coherency WDL  Content WDL  Delusions None reported or observed  Perception WDL  Hallucination None reported or observed  Judgment WDL  Confusion WDL  Danger to Self  Current suicidal ideation? Denies  Self-Injurious Behavior No self-injurious ideation or behavior indicators observed or expressed   Agreement Not to Harm Self Yes  Description of Agreement Verbal  Danger to Others  Danger to Others None reported or observed    "

## 2024-06-18 NOTE — BHH Counselor (Signed)
 CSW contacted Mr. ONEIDA [phone]) of [facility] to confirm address that pt needed to go to. He confirmed that address at [address], [place], [place] [address]. No other concerns expressed. Contact ended without incident.   Nadara SAUNDERS. Chaim, MSW, LCSW, LCAS 06/18/2024 10:43 AM

## 2024-06-18 NOTE — Plan of Care (Signed)
   Problem: Education: Goal: Emotional status will improve Outcome: Progressing Goal: Mental status will improve Outcome: Progressing

## 2024-06-19 NOTE — Discharge Summary (Signed)
 " Physician Discharge Summary Note  Patient:  Jon Clayton is an 29 y.o., male MRN:  [id number] DOB:  1995-06-23 Patient phone:  [phone] (home)  Patient address:   [address] [place] [address],   Total time spent: 40 min Date of Admission:  06/03/2024 Date of Discharge: 06/18/2024  Reason for Admission:  Patient is admitted to the inpatient psychiatric unit from behavioral health urgent care with complaints of suicidal ideation, housing instability, and auditory and visual hallucinations. Prior to reaching out for help, patient was actively contemplating suicide with a plan to get intoxicated and cut himself. This is not his first suicide attempt, with his most recent attempt occurring approximately two years ago. He continues to endorse suicidal ideations but is able to contract for safety while on the unit. When asked about homicidal ideation, patient stated I do not have any plans to kill anyone but I would like to hurt the person that still has access to my bank account. He denies access to firearms and reports no pending court dates or legal charges. Patient is admitted to  psych unit with Q15 min safety monitoring. Multidisciplinary team approach is offered. Medication management; group/milieu therapy is offered.   Principal Problem: MDD (major depressive disorder), recurrent episode, with atypical features Discharge Diagnoses: Principal Problem:   MDD (major depressive disorder), recurrent episode, with atypical features   Past Psychiatric History: see h&p  Family Psychiatric  History: see h&p Social History:  Social History   Substance and Sexual Activity  Alcohol Use Not Currently   Comment: reports periodic alcohol use     Social History   Substance and Sexual Activity  Drug Use Not Currently   Frequency: 1.0 times per week   Types: Marijuana, Amphetamines   Comment: 1/3 gm daily    Social History   Socioeconomic History   Marital status: Single    Spouse name:  Not on file   Number of children: Not on file   Years of education: Not on file   Highest education level: 8th grade  Occupational History   Not on file  Tobacco Use   Smoking status: Every Day    Current packs/day: 1.00    Average packs/day: 1 pack/day for 11.0 years (11.0 ttl pk-yrs)    Types: Cigarettes   Smokeless tobacco: Never  Vaping Use   Vaping status: Never Used  Substance and Sexual Activity   Alcohol use: Not Currently    Comment: reports periodic alcohol use   Drug use: Not Currently    Frequency: 1.0 times per week    Types: Marijuana, Amphetamines    Comment: 1/3 gm daily   Sexual activity: Not Currently  Other Topics Concern   Not on file  Social History Narrative   Not on file   Social Drivers of Health   Tobacco Use: High Risk (06/03/2024)   Patient History    Smoking Tobacco Use: Every Day    Smokeless Tobacco Use: Never    Passive Exposure: Not on file  Financial Resource Strain: Not at Risk (10/27/2023)   Received from [employer]    How hard is it for you to pay for the very basics like food, housing, heating, medical care, and medications?: 1  Food Insecurity: Food Insecurity Present (06/03/2024)   Epic    Worried About Programme Researcher, Broadcasting/film/video in the Last Year: Often true    Ran Out of Food in the Last Year: Often true  Transportation Needs: Barrister's Clerk  Needs (06/03/2024)   Epic    Lack of Transportation (Medical): Yes    Lack of Transportation (Non-Medical): Yes  Physical Activity: At Risk (10/27/2023)   Received from [facility]   Physical Activity    On average, how many minutes do you engage in exercise at this level?: 2  Stress: Not at Risk (10/27/2023)   Received from [facility]   Stress    Do you feel these kinds of stress these days?: 1  Social Connections: At Risk (10/27/2023)   Received from [facility]   Social Connections    How often do you see or talk to people that you care about and feel close to? (For example:  talking to friends on phone, visiting friends or family, going to church or club meetings): 2  Depression (PHQ2-9): Medium Risk (09/21/2022)   Depression (PHQ2-9)    PHQ-2 Score: 8  Alcohol Screen: Low Risk (06/03/2024)   Alcohol Screen    Last Alcohol Screening Score (AUDIT): 4  Housing: High Risk (06/03/2024)   Epic    Unable to Pay for Housing in the Last Year: Yes    Number of Times Moved in the Last Year: 0    Homeless in the Last Year: Yes  Utilities: At Risk (06/03/2024)   Epic    Threatened with loss of utilities: Yes  Health Literacy: Not on file   Past Medical History:  Past Medical History:  Diagnosis Date   Depression    Psychosis (HCC)    PTSD (post-traumatic stress disorder)     Past Surgical History:  Procedure Laterality Date   NASAL SINUS SURGERY Bilateral    TONSILLECTOMY     Family History: History reviewed. No pertinent family history.  Hospital Course:  Patient is admitted to the inpatient psychiatric unit from behavioral health urgent care with complaints of suicidal ideation, housing instability, and auditory and visual hallucinations. Prior to reaching out for help, patient was actively contemplating suicide with a plan to get intoxicated and cut himself. This is not his first suicide attempt, with his most recent attempt occurring approximately two years ago. He continues to endorse suicidal ideations but is able to contract for safety while on the unit. When asked about homicidal ideation, patient stated I do not have any plans to kill anyone but I would like to hurt the person that still has access to my bank account. He denies access to firearms and reports no pending court dates or legal charges.   On admission, patient is maintained on the following medication regimen which she tolerated very well with no reported side effects: Zyprexa  5 mg in morning, 10 mg in PM  Continue lithium  300 mg BID - Continue Omega 3 fattty acids  Continue prazosin  2 mg  nightly   Patient showed significant improvement in his mood and maintain safe behaviors during this admission. Detailed risk assessment is complete based on clinical exam and individual risk factors and acute suicide risk is low and acute violence risk is low.    On the day of discharge, patient denies SI/HI/plan and denies hallucinations.  Patient remains future oriented and is willing to participate in outpatient mental health services.  Currently, all modifiable risk of harm to self/harm to others have been addressed and patient is no longer appropriate for the acute inpatient setting and is able to continue treatment for mental health needs in the community with the supports as indicated below.  Patient is educated and verbalized understanding of discharge plan of care including  medications, follow-up appointments, mental health resources and further crisis services in the community.  He is instructed to call 911 or present to the nearest emergency room should he experience any decompensation in mood, disturbance of bowel or return of suicidal/homicidal ideations.  Patient verbalizes understanding of this education and agrees to this plan of care  Physical Findings: AIMS:  , ,  ,  ,    CIWA:    COWS:      Psychiatric Specialty Exam:  Presentation  General Appearance:  Appropriate for Environment  Eye Contact: Fair  Speech: Clear and Coherent  Speech Volume: Normal    Mood and Affect  Mood: Euthymic  Affect: Appropriate   Thought Process  Thought Processes: Coherent  Descriptions of Associations:Intact  Orientation:Full (Time, Place and Person)  Thought Content:Logical  Hallucinations:No data recorded Ideas of Reference:None  Suicidal Thoughts:No data recorded Homicidal Thoughts:No data recorded  Sensorium  Memory: Immediate Fair; Remote Fair  Judgment: Fair  Insight: Fair   Art Therapist  Concentration: Fair  Attention  Span: Fair  Recall: [employer] of Knowledge: Fair  Language: Fair   Psychomotor Activity  Psychomotor Activity:No data recorded Musculoskeletal: Strength & Muscle Tone: within normal limits Gait & Station: normal Assets  Assets: Manufacturing Systems Engineer; Physical Health; Resilience   Sleep  Sleep:No data recorded   Physical Exam: Physical Exam ROS Blood pressure 120/81, pulse 94, temperature 98.1 F (36.7 C), temperature source Oral, resp. rate 12, height 5' 7 (1.702 m), weight 97.5 kg, SpO2 98%. Body mass index is 33.67 kg/m.   Tobacco Use History[1] Tobacco Cessation:  A prescription for an FDA-approved tobacco cessation medication was offered at discharge and the patient refused   Blood Alcohol level:  Lab Results  Component Value Date   [facility] <15 06/03/2024   ETH <10 09/16/2022    Metabolic Disorder Labs:  Lab Results  Component Value Date   HGBA1C 5.0 06/17/2024   MPG 96.8 06/17/2024   MPG 93.93 07/04/2022   No results found for: PROLACTIN Lab Results  Component Value Date   CHOL 150 07/04/2022   TRIG 106 07/04/2022   HDL 50 07/04/2022   CHOLHDL 3.0 07/04/2022   VLDL 21 07/04/2022   LDLCALC 79 07/04/2022   LDLCALC 55 11/10/2021    See Psychiatric Specialty Exam and Suicide Risk Assessment completed by Attending Physician prior to discharge.  Discharge destination:  [facility]  Is patient on multiple antipsychotic therapies at discharge:  No   Has Patient had three or more failed trials of antipsychotic monotherapy by history:  No  Recommended Plan for Multiple Antipsychotic Therapies: NA   Allergies as of 06/18/2024       Reactions   Amoxicillin Anaphylaxis, Rash   Penicillins Anaphylaxis, Rash   Other Swelling, Other (See Comments)   Pt said he is allergic to tapioca. Causes big knots to appear on his tongue.   Pertussis Vaccines Hives, Other (See Comments)   Per MD, said he was allergic to a medication in the vaccine   Tape Other (See  Comments)   Bandages, once removed, leave the skin swollen/puffy/irritated where they were placed   Blue Mussel [mytilus] Itching, Swelling, Rash, Other (See Comments)   Itchy throat        Medication List     TAKE these medications      Indication  albuterol  108 (90 Base) MCG/ACT inhaler Commonly known as: VENTOLIN  HFA Inhale 2 puffs into the lungs every 6 (six) hours as needed for wheezing or  shortness of breath.  Indication: Spasm of Lung Air Passages   lithium  carbonate 300 MG capsule Take 1 capsule (300 mg total) by mouth 2 (two) times daily with a meal.  Indication: Depressive Phase of Manic-Depression   OLANZapine  10 MG tablet Commonly known as: ZYPREXA  Take 1 tablet (10 mg total) by mouth at bedtime. What changed:  medication strength how much to take how to take this when to take this additional instructions  Indication: Depressive Phase of Manic-Depression   OLANZapine  5 MG tablet Commonly known as: ZYPREXA  Take 1 tablet (5 mg total) by mouth daily. What changed: You were already taking a medication with the same name, and this prescription was added. Make sure you understand how and when to take each.  Indication: Depressive Phase of Manic-Depression   omega-3 acid ethyl esters 1 g capsule Commonly known as: LOVAZA  Take 1 capsule (1 g total) by mouth 2 (two) times daily.  Indication: High Amount of Triglycerides in the Blood   prazosin  2 MG capsule Commonly known as: MINIPRESS  Take 1 capsule (2 mg total) by mouth at bedtime.  Indication: Frightening Dreams        Follow-up Information     [facility] Follow up.   Why: Your appointment is scheduled for 06/25/24 at 12pm. It is a virtual appointment and they will reach out to you at [phone]. Contact information: [address]  [address] [place] [address] [phone]                 Follow-up recommendations:  Activity:  As tolerated    Signed: Deldrick Linch, MD 06/19/2024,  9:36 PM          [1]  Social History Tobacco Use  Smoking Status Every Day   Current packs/day: 1.00   Average packs/day: 1 pack/day for 11.0 years (11.0 ttl pk-yrs)   Types: Cigarettes  Smokeless Tobacco Never   "
